# Patient Record
Sex: Female | Born: 1940 | Race: White | Hispanic: No | Marital: Single | Smoking: Former smoker
Health system: Southern US, Community
[De-identification: ages and names within clinical notes are randomized; demographics above are authoritative.]

## PROBLEM LIST (undated history)

## (undated) DIAGNOSIS — I340 Nonrheumatic mitral (valve) insufficiency: Secondary | ICD-10-CM

## (undated) DIAGNOSIS — I10 Essential (primary) hypertension: Secondary | ICD-10-CM

## (undated) DIAGNOSIS — E119 Type 2 diabetes mellitus without complications: Secondary | ICD-10-CM

## (undated) DIAGNOSIS — I4819 Other persistent atrial fibrillation: Secondary | ICD-10-CM

## (undated) DIAGNOSIS — E785 Hyperlipidemia, unspecified: Secondary | ICD-10-CM

## (undated) DIAGNOSIS — Z853 Personal history of malignant neoplasm of breast: Secondary | ICD-10-CM

## (undated) DIAGNOSIS — M810 Age-related osteoporosis without current pathological fracture: Secondary | ICD-10-CM

## (undated) DIAGNOSIS — D0510 Intraductal carcinoma in situ of unspecified breast: Secondary | ICD-10-CM

## (undated) HISTORY — PX: HX BREAST BIOPSY: SHX20

## (undated) HISTORY — PX: HX HEMICOLECTOMY: 2100001145

## (undated) HISTORY — DX: Hyperlipidemia, unspecified: E78.5

## (undated) HISTORY — DX: Age-related osteoporosis without current pathological fracture: M81.0

## (undated) HISTORY — PX: HX COLONOSCOPY: 2100001147

## (undated) HISTORY — DX: Personal history of malignant neoplasm of breast: Z85.3

## (undated) HISTORY — PX: ILEOSTOMY: SHX1783

## (undated) HISTORY — DX: Intraductal carcinoma in situ of unspecified breast: D05.10

## (undated) HISTORY — PX: HX OTHER: 2100001105

## (undated) HISTORY — DX: Essential (primary) hypertension: I10

## (undated) HISTORY — DX: Other persistent atrial fibrillation: I48.19

## (undated) HISTORY — PX: CATARACT EXTRACTION, BILATERAL: SHX1313

## (undated) HISTORY — DX: Nonrheumatic mitral (valve) insufficiency: I34.0

## (undated) HISTORY — PX: PARTIAL HYSTERECTOMY: SHX80

---

## 2008-04-14 ENCOUNTER — Encounter: Admission: RE | Admit: 2008-04-14 | Discharge: 2008-04-14 | Payer: Self-pay | Admitting: Internal Medicine

## 2008-05-22 ENCOUNTER — Ambulatory Visit (HOSPITAL_COMMUNITY): Admission: RE | Admit: 2008-05-22 | Discharge: 2008-05-22 | Payer: Self-pay | Admitting: *Deleted

## 2008-11-17 ENCOUNTER — Encounter: Admission: RE | Admit: 2008-11-17 | Discharge: 2008-11-17 | Payer: Self-pay | Admitting: Internal Medicine

## 2009-03-08 ENCOUNTER — Ambulatory Visit (HOSPITAL_COMMUNITY): Payer: Self-pay | Admitting: INTERNAL MEDICINE

## 2009-03-25 ENCOUNTER — Encounter: Admission: RE | Admit: 2009-03-25 | Discharge: 2009-03-25 | Payer: Self-pay | Admitting: Internal Medicine

## 2010-09-05 ENCOUNTER — Encounter: Payer: Self-pay | Admitting: Internal Medicine

## 2010-09-29 ENCOUNTER — Ambulatory Visit: Payer: Medicare Other | Attending: Internal Medicine | Admitting: Rehabilitative and Restorative Service Providers"

## 2010-09-29 DIAGNOSIS — IMO0001 Reserved for inherently not codable concepts without codable children: Secondary | ICD-10-CM | POA: Insufficient documentation

## 2010-09-29 DIAGNOSIS — R42 Dizziness and giddiness: Secondary | ICD-10-CM | POA: Insufficient documentation

## 2010-10-14 ENCOUNTER — Encounter: Payer: Medicare Other | Admitting: Rehabilitative and Restorative Service Providers"

## 2010-12-27 NOTE — Op Note (Signed)
NAME:  Virginia Gilbert, Virginia Gilbert NO.:  [id number]   MEDICAL RECORD NO.:  [id number]          PATIENT TYPE:  AMB   LOCATION:  ENDO                         FACILITY:  [facility]   PHYSICIAN:  Georgiana Spinner, M.D.    DATE OF BIRTH:  1941-02-19   DATE OF PROCEDURE:  DATE OF DISCHARGE:                               OPERATIVE REPORT   PROCEDURE:  Upper endoscopy.   INDICATIONS:  GERD and abdominal pain.   ANESTHESIA:  Fentanyl 50 mcg, Versed 5 mg.   PROCEDURE:  With the patient mildly sedated in the left lateral  decubitus position the Pentax videoscopic endoscope was inserted in the  mouth and passed under direct vision through the esophagus which  appeared normal into the stomach.  The fundus, body, antrum, duodenal  bulb, second portion of the duodenum were visualized and all appeared  normal.  From this point the endoscope was slowly withdrawn taking  circumferential views of duodenal mucosa until the endoscope had been  pulled back into the stomach, placed in retroflexion to view the stomach  from below.  The endoscope was straightened and withdrawn taking  circumferential views of the remaining gastric and esophageal mucosa.  The patient's vital signs and pulse oximeter remained stable.  The  patient tolerated the procedure well without apparent complications.   FINDINGS:  Negative examination.   PLAN:  I will have the patient call me and set up a return visit for  further follow-up for her abdominal pain.  Proceed to colonoscopy as  planned.           ______________________________  Georgiana Spinner, M.D.     GMO/MEDQ  D:  05/22/2008  T:  05/22/2008  Job:  [id number]

## 2010-12-27 NOTE — Op Note (Signed)
NAME:  Virginia Gilbert, KIRCHMAN NO.:  [id number]   MEDICAL RECORD NO.:  [id number]          PATIENT TYPE:  AMB   LOCATION:  ENDO                         FAC[facility]  [facility]   PHYSICIAN:  Georgiana Spinner, M.D.    DATE OF BIRTH:  07-06-1941   DATE OF PROCEDURE:  DATE OF DISCHARGE:                               OPERATIVE REPORT   PROCEDURE:  Colonoscopy.   INDICATIONS:  Abdominal pain, colon cancer screening.   ANESTHESIA:  Fentanyl 20 mcg, Versed 2 mg.   PROCEDURE:  With the patient mildly sedated in the left lateral  decubitus position, the Pentax videoscopic colonoscope was inserted in  the rectum and passed under direct vision with pressure applied to reach  the cecum, identified by the ileocecal valve and appendiceal orifice,  both which were photographed.  From this point the colonoscope was  slowly withdrawn, taking circumferential views of the colonic mucosa,  stopping only in the rectum, which appeared normal on direct and showed  hemorrhoids on retroflexed view.  The endoscope was straightened and  withdrawn.  The patient's vital signs, pulse oximeter remained stable.  The patient tolerated the procedure well without apparent complications.   FINDINGS:  Internal hemorrhoids, otherwise an unremarkable examination.   PLAN:  Will have patient follow up with me in 5 years for repeat  discussion of colon cancer screening and to make an appointment in about  6 weeks for followup of abdominal pain.           ______________________________  Georgiana Spinner, M.D.     GMO/MEDQ  D:  05/22/2008  T:  05/22/2008  Job:  [id number]

## 2011-05-16 LAB — GLUCOSE, CAPILLARY: Glucose-Capillary: 127 — ABNORMAL HIGH

## 2011-06-09 ENCOUNTER — Encounter (INDEPENDENT_AMBULATORY_CARE_PROVIDER_SITE_OTHER): Payer: Self-pay | Admitting: GENERAL SURGERY

## 2011-06-12 ENCOUNTER — Encounter (INDEPENDENT_AMBULATORY_CARE_PROVIDER_SITE_OTHER): Payer: Self-pay | Admitting: GENERAL SURGERY

## 2011-06-14 ENCOUNTER — Ambulatory Visit (INDEPENDENT_AMBULATORY_CARE_PROVIDER_SITE_OTHER): Payer: Medicare Other | Admitting: GENERAL SURGERY

## 2011-06-14 ENCOUNTER — Encounter (INDEPENDENT_AMBULATORY_CARE_PROVIDER_SITE_OTHER): Payer: Self-pay | Admitting: GENERAL SURGERY

## 2011-06-14 VITALS — BP 139/83 | HR 82 | Temp 97.4°F | Ht 64.0 in | Wt 153.3 lb

## 2011-06-14 DIAGNOSIS — D0590 Unspecified type of carcinoma in situ of unspecified breast: Secondary | ICD-10-CM

## 2011-06-14 DIAGNOSIS — D0510 Intraductal carcinoma in situ of unspecified breast: Secondary | ICD-10-CM | POA: Insufficient documentation

## 2011-06-14 NOTE — Progress Notes (Signed)
[facility], Department of Surgery  [address], [place]  [place], [place] [address]  [phone]    PROGRESS NOTE    PATIENT NAME: Selena Stokes, Selena Stokes  CHART NUMBER: [id number]  DATE OF BIRTH: [birth year]  DATE OF SERVICE: 06/14/2011    SUBJECTIVE:  A 70 year old female presents in followup after a left stereotactic biopsy per Dr. Carey Bullocks on June 06, 2011, demonstrating high-grade ductal carcinoma in situ.  This came about after screening bilateral mammography from May 17, 2011, demonstrated increasing calcifications in the upper outer left breast.  These seem to be over a large area in the upper outer left breast.  Subsequent spot compression views further define these and she was ultimately categorized a Bi-RADS category 5.  I reviewed her films and would concur.  The right breast was unremarkable.    There is no family history for breast or ovarian or colon cancer.    OBJECTIVE:  Healthy in appearance.  No cervical, supraclavicular or axillary lymphadenopathy.  Lung fields clear and equal.  Heart regular.  Right breast, without suspicious skin or nipple changes and no dominant masses.  Left breast demonstrates some mild induration in the upper outer left breast from the mammotome biopsy but no masses or palpable lymphadenopathy.  Abdomen without hepatomegaly.    ASSESSMENT:  High-grade ductal carcinoma in situ (DCIS), left breast.    PLAN:  I had a long discussion with Mrs. Lem and her husband.  I reviewed the role of breast conservation versus mastectomy for local regional control and staging.  I had a very long discussion.  Mrs. Santori states that she has considered her options and wishes to proceed with a mastectomy.  I discussed the role of sentinel lymph node biopsy and informed consent was obtained.      Theodoro Clock, MD, FACS  Associate Professor, [facility] Department of Surgery  [facility], [place] Division     [id number]; D: 06/14/2011 15:17:04; T: 06/14/2011 15:28:17

## 2011-06-14 NOTE — Patient Instructions (Signed)
Per office instructions - Call Teresa with any questions or concerns ([phone])

## 2011-06-15 ENCOUNTER — Encounter (INDEPENDENT_AMBULATORY_CARE_PROVIDER_SITE_OTHER): Payer: Self-pay | Admitting: GENERAL SURGERY

## 2011-06-30 DIAGNOSIS — D0590 Unspecified type of carcinoma in situ of unspecified breast: Secondary | ICD-10-CM

## 2011-06-30 HISTORY — PX: HX MASTECTOMY, SIMPLE: SHX3

## 2011-07-01 DIAGNOSIS — D0590 Unspecified type of carcinoma in situ of unspecified breast: Secondary | ICD-10-CM

## 2011-07-03 ENCOUNTER — Encounter (INDEPENDENT_AMBULATORY_CARE_PROVIDER_SITE_OTHER): Payer: Self-pay | Admitting: GENERAL SURGERY

## 2011-07-11 ENCOUNTER — Encounter (INDEPENDENT_AMBULATORY_CARE_PROVIDER_SITE_OTHER): Payer: Self-pay | Admitting: GENERAL SURGERY

## 2011-07-11 ENCOUNTER — Ambulatory Visit (INDEPENDENT_AMBULATORY_CARE_PROVIDER_SITE_OTHER): Payer: Medicare Other | Admitting: GENERAL SURGERY

## 2011-07-11 VITALS — BP 153/94 | HR 86 | Temp 97.5°F | Ht 64.0 in | Wt 149.2 lb

## 2011-07-11 DIAGNOSIS — Z09 Encounter for follow-up examination after completed treatment for conditions other than malignant neoplasm: Secondary | ICD-10-CM

## 2011-07-11 DIAGNOSIS — D0510 Intraductal carcinoma in situ of unspecified breast: Secondary | ICD-10-CM | POA: Insufficient documentation

## 2011-07-11 NOTE — Progress Notes (Signed)
[facility], Department of Surgery  [address], [place]  [place], [place] [address]  [phone]    PROGRESS NOTE    PATIENT NAME: Selena Stokes, Selena Stokes  CHART NUMBER: [id number]  DATE OF BIRTH: 01-03-1941  DATE OF SERVICE: 07/11/2011    SUBJECTIVE:  Follow up after a left mastectomy with sentinel lymph node biopsy on June 30, 2011.  Postoperative course was uneventful.  Pathology demonstrated high-grade DCIS with microinvasion.  Sentinel lymph node negative.    OBJECTIVE:  Her subcuticular closure is healing quite nicely.  The drain was removed.  Area redressed in sterile fashion.    ASSESSMENT:  High-grade, ER/PR negative DCIS with microinvasion left breast.    PLAN:  Follow up in 2 weeks for repeat assessment.  Wound and activity instructions given.      Theodoro Clock, MD, FACS  Associate Professor, [facility] Department of Surgery  [facility], [place] Division    [id number]; D: 07/11/2011 12:55:12; T: 07/11/2011 14:39:31

## 2011-07-11 NOTE — Patient Instructions (Signed)
Follow up in 2 wks. Call or return with any concerns or problems. [phone].

## 2011-07-26 ENCOUNTER — Encounter (INDEPENDENT_AMBULATORY_CARE_PROVIDER_SITE_OTHER): Payer: Self-pay | Admitting: GENERAL SURGERY

## 2011-07-31 ENCOUNTER — Encounter (INDEPENDENT_AMBULATORY_CARE_PROVIDER_SITE_OTHER): Payer: Self-pay | Admitting: GENERAL SURGERY

## 2011-08-02 ENCOUNTER — Encounter (INDEPENDENT_AMBULATORY_CARE_PROVIDER_SITE_OTHER): Payer: Self-pay | Admitting: GENERAL SURGERY

## 2011-08-02 ENCOUNTER — Ambulatory Visit (INDEPENDENT_AMBULATORY_CARE_PROVIDER_SITE_OTHER): Payer: Medicare Other | Admitting: GENERAL SURGERY

## 2011-08-02 VITALS — BP 132/85 | HR 78 | Temp 97.2°F | Ht 64.0 in | Wt 143.6 lb

## 2011-08-02 DIAGNOSIS — D0510 Intraductal carcinoma in situ of unspecified breast: Secondary | ICD-10-CM

## 2011-08-02 DIAGNOSIS — Z09 Encounter for follow-up examination after completed treatment for conditions other than malignant neoplasm: Secondary | ICD-10-CM

## 2011-08-02 NOTE — Progress Notes (Signed)
[facility], Department of Surgery  [address], [place]  [place], [place] [address]  [phone]    PROGRESS NOTE    PATIENT NAME: Selena Stokes, Selena Stokes  CHART NUMBER: [id number]  DATE OF BIRTH: 04/26/1941  DATE OF SERVICE: 08/02/2011    SUBJECTIVE:  A 70 year old female presents in followup after a left MAST/SNB for a high-grade receptor negative DCIS with microinvasion on June 30, 2011.    OBJECTIVE:  Exam reveals some degree of seroma, 100 mL was evacuated in the standard fashion, dark serosanguineous.  Her subcuticular closure is healing quite nicely.    ASSESSMENT:  ER/PR negative, high-grade DCIS with microinvasion left breast, June 30, 2011.    PLAN:  Follow up in February for a 31-month surveillance exam.      Theodoro Clock, MD, FACS  Associate Professor, [facility] Department of Surgery  [facility], [place] Division    [id number]; D: 08/02/2011 14:14:47; T: 08/02/2011 17:40:07

## 2011-08-02 NOTE — Patient Instructions (Signed)
Follow up in 2 months. Call or return with any concerns or problems. [phone].

## 2011-10-02 ENCOUNTER — Ambulatory Visit (INDEPENDENT_AMBULATORY_CARE_PROVIDER_SITE_OTHER): Payer: Medicare Other | Admitting: GENERAL SURGERY

## 2011-10-02 ENCOUNTER — Encounter (INDEPENDENT_AMBULATORY_CARE_PROVIDER_SITE_OTHER): Payer: Self-pay | Admitting: GENERAL SURGERY

## 2011-10-02 VITALS — BP 113/74 | HR 67 | Temp 97.4°F | Ht 64.0 in | Wt 146.1 lb

## 2011-10-02 DIAGNOSIS — D0510 Intraductal carcinoma in situ of unspecified breast: Secondary | ICD-10-CM

## 2011-10-02 DIAGNOSIS — Z09 Encounter for follow-up examination after completed treatment for conditions other than malignant neoplasm: Secondary | ICD-10-CM

## 2011-10-02 NOTE — Patient Instructions (Signed)
Follow up in 3 months. Call or return with any concerns or problems. [phone].

## 2011-10-02 NOTE — Progress Notes (Signed)
[facility], Department of Surgery  [address] [place]  [place], [place] [address]  [phone]    PROGRESS NOTE    PATIENT NAME: Selena Stokes, Selena Stokes  CHART NUMBER: [id number]  DATE OF BIRTH: [birth year]  DATE OF SERVICE: 10/02/2011    SUBJECTIVE:  This 71 year old female presents for a 35-month surveillance exam for left breast stage IA, T1a N0 M0, ER/PR negative, high-grade DCIS with microinvasion left breast (June 30, 2011).  Ms. Sergent is status post a left MAST/SNB for this process.  Her postoperative course has been uneventful.    OBJECTIVE:  No cervical, supraclavicular or axillary lymphadenopathy.  Lung fields clear and equal.  Heart regular.  Right breast without suspicious skin or nipple changes.  No dominant masses.  The left chest wall is well healed with minimal seroma.  No nodularity.  Abdomen is soft without hepatomegaly.    ASSESSMENT:  Stage IA, T1a N0 M0, ER/PR negative, high-grade DCIS with microinvasion left breast.    PLAN:  Follow up in 3 months for her 65-month surveillance exam.      Theodoro Clock, MD, FACS  Associate Professor, [facility] Department of Surgery  [facility], [place] Division    [id number]; D: 10/02/2011 13:20:58; T: 10/02/2011 15:12:26

## 2012-01-01 ENCOUNTER — Encounter (INDEPENDENT_AMBULATORY_CARE_PROVIDER_SITE_OTHER): Payer: Self-pay | Admitting: GENERAL SURGERY

## 2012-01-01 ENCOUNTER — Ambulatory Visit (INDEPENDENT_AMBULATORY_CARE_PROVIDER_SITE_OTHER): Payer: Medicare Other | Admitting: GENERAL SURGERY

## 2012-01-01 VITALS — BP 131/86 | HR 68 | Temp 97.8°F | Ht 64.0 in | Wt 148.4 lb

## 2012-01-01 DIAGNOSIS — Z09 Encounter for follow-up examination after completed treatment for conditions other than malignant neoplasm: Secondary | ICD-10-CM

## 2012-01-01 DIAGNOSIS — D0510 Intraductal carcinoma in situ of unspecified breast: Secondary | ICD-10-CM

## 2012-01-01 DIAGNOSIS — Z853 Personal history of malignant neoplasm of breast: Secondary | ICD-10-CM

## 2012-01-01 NOTE — Progress Notes (Signed)
[facility], Department of Surgery  [address] [place]  [place], [place] [address]  [phone]    PROGRESS NOTE    PATIENT NAME: Selena Stokes, Selena Stokes  CHART NUMBER: [id number]  DATE OF BIRTH: 25-Nov-1940  DATE OF SERVICE: 01/01/2012    SUBJECTIVE:  A 71 year old female presents for her 86-month surveillance exam for early stage left breast cancer.    She is S/P a left mastectomy with sentinel lymph node biopsy on June 30, 2011.  She demonstrated a stage IA, T1a N0 M0, ER/PR negative, high-grade DCIS with microinvasion.    Because of her receptor negativity, she was not referred for any further treatment.    Mrs. Vanderpool recently had some concerns regarding possible subareolar right breast mass.  She underwent a right diagnostic mammogram and focused breast ultrasound, both of which were BI-RADS category 1 with no evidence of abnormalities.    OBJECTIVE:  Healthy in appearance.  No cervical, supraclavicular or axillary lymphadenopathy.  Lung fields clear and equal.  Heart:  Regular.  Right breast without suspicious skin or nipple changes.  No evidence of dominant masses.  I had her point out the area of concern in the 12 o'clock position of the subareolar area.  I could feel no evidence of lesions.  The left chest wall was well healed without nodularity or suspicious processes.  Abdomen:  Soft without hepatomegaly.    ASSESSMENT:  Stage IA, ER/PR negative, high-grade DCIS with microinvasion left breast, 6 months out.    PLAN:  Follow up in November for her 1-year surveillance exam.      Theodoro Clock, MD, FACS  Associate Professor, [facility] Department of Surgery  [facility], [place] Division    [id number]; D: 01/01/2012 13:46:54; T: 01/01/2012 14:32:07

## 2012-01-01 NOTE — Patient Instructions (Signed)
Follow up in 6 months. Call or return with any concerns or problems. [phone].

## 2012-03-14 ENCOUNTER — Other Ambulatory Visit: Payer: Self-pay | Admitting: Internal Medicine

## 2012-03-14 DIAGNOSIS — R42 Dizziness and giddiness: Secondary | ICD-10-CM

## 2012-03-15 ENCOUNTER — Inpatient Hospital Stay: Admission: RE | Admit: 2012-03-15 | Payer: Medicare Other | Source: Ambulatory Visit

## 2012-03-18 ENCOUNTER — Other Ambulatory Visit: Payer: Self-pay | Admitting: Internal Medicine

## 2012-03-18 DIAGNOSIS — R42 Dizziness and giddiness: Secondary | ICD-10-CM

## 2012-03-22 ENCOUNTER — Ambulatory Visit
Admission: RE | Admit: 2012-03-22 | Discharge: 2012-03-22 | Disposition: A | Discharge status: Home / Self Care | Payer: Medicare Other | Source: Ambulatory Visit | Attending: Internal Medicine | Admitting: Internal Medicine

## 2012-03-22 DIAGNOSIS — R42 Dizziness and giddiness: Secondary | ICD-10-CM

## 2012-03-29 ENCOUNTER — Other Ambulatory Visit: Payer: Self-pay | Admitting: Internal Medicine

## 2012-03-29 DIAGNOSIS — E041 Nontoxic single thyroid nodule: Secondary | ICD-10-CM

## 2012-04-03 ENCOUNTER — Ambulatory Visit
Admission: RE | Admit: 2012-04-03 | Discharge: 2012-04-03 | Disposition: A | Discharge status: Home / Self Care | Payer: Medicare Other | Source: Ambulatory Visit | Attending: Internal Medicine | Admitting: Internal Medicine

## 2012-04-03 DIAGNOSIS — E041 Nontoxic single thyroid nodule: Secondary | ICD-10-CM

## 2012-04-09 ENCOUNTER — Other Ambulatory Visit: Payer: Self-pay | Admitting: Neurology

## 2012-04-09 DIAGNOSIS — R42 Dizziness and giddiness: Secondary | ICD-10-CM

## 2012-04-09 DIAGNOSIS — I10 Essential (primary) hypertension: Secondary | ICD-10-CM

## 2012-04-09 DIAGNOSIS — H9319 Tinnitus, unspecified ear: Secondary | ICD-10-CM

## 2012-04-09 DIAGNOSIS — E119 Type 2 diabetes mellitus without complications: Secondary | ICD-10-CM

## 2012-04-10 ENCOUNTER — Other Ambulatory Visit: Payer: Self-pay | Admitting: Internal Medicine

## 2012-04-10 DIAGNOSIS — E041 Nontoxic single thyroid nodule: Secondary | ICD-10-CM

## 2012-04-11 ENCOUNTER — Other Ambulatory Visit (HOSPITAL_COMMUNITY)
Admission: RE | Admit: 2012-04-11 | Discharge: 2012-04-11 | Disposition: A | Discharge status: Home / Self Care | Payer: Medicare Other | Source: Ambulatory Visit | Attending: Interventional Radiology | Admitting: Interventional Radiology

## 2012-04-11 ENCOUNTER — Ambulatory Visit
Admission: RE | Admit: 2012-04-11 | Discharge: 2012-04-11 | Disposition: A | Discharge status: Home / Self Care | Payer: Medicare Other | Source: Ambulatory Visit | Attending: Internal Medicine | Admitting: Internal Medicine

## 2012-04-11 DIAGNOSIS — E049 Nontoxic goiter, unspecified: Secondary | ICD-10-CM | POA: Insufficient documentation

## 2012-04-11 DIAGNOSIS — E041 Nontoxic single thyroid nodule: Secondary | ICD-10-CM

## 2012-04-16 ENCOUNTER — Ambulatory Visit
Admission: RE | Admit: 2012-04-16 | Discharge: 2012-04-16 | Disposition: A | Discharge status: Home / Self Care | Payer: Medicare Other | Source: Ambulatory Visit | Attending: Neurology | Admitting: Neurology

## 2012-04-16 DIAGNOSIS — R42 Dizziness and giddiness: Secondary | ICD-10-CM

## 2012-04-16 DIAGNOSIS — H9319 Tinnitus, unspecified ear: Secondary | ICD-10-CM

## 2012-04-16 DIAGNOSIS — E119 Type 2 diabetes mellitus without complications: Secondary | ICD-10-CM

## 2012-04-16 DIAGNOSIS — I10 Essential (primary) hypertension: Secondary | ICD-10-CM

## 2012-04-16 MED ORDER — GADOBENATE DIMEGLUMINE 529 MG/ML IV SOLN
15.0000 mL | Freq: Once | INTRAVENOUS | Status: AC | PRN
  Administered 2012-04-16: 15 mL via INTRAVENOUS

## 2012-04-30 DIAGNOSIS — N63 Unspecified lump in unspecified breast: Secondary | ICD-10-CM

## 2012-04-30 DIAGNOSIS — Z853 Personal history of malignant neoplasm of breast: Secondary | ICD-10-CM

## 2012-06-10 ENCOUNTER — Encounter (INDEPENDENT_AMBULATORY_CARE_PROVIDER_SITE_OTHER): Payer: Self-pay | Admitting: GENERAL SURGERY

## 2012-07-01 ENCOUNTER — Ambulatory Visit (INDEPENDENT_AMBULATORY_CARE_PROVIDER_SITE_OTHER): Payer: Medicare Other | Admitting: GENERAL SURGERY

## 2012-07-01 ENCOUNTER — Encounter (INDEPENDENT_AMBULATORY_CARE_PROVIDER_SITE_OTHER): Payer: Self-pay | Admitting: GENERAL SURGERY

## 2012-07-01 VITALS — BP 128/75 | HR 70 | Temp 97.1°F | Ht 64.0 in | Wt 141.8 lb

## 2012-07-01 DIAGNOSIS — Z853 Personal history of malignant neoplasm of breast: Secondary | ICD-10-CM

## 2012-07-01 DIAGNOSIS — Z09 Encounter for follow-up examination after completed treatment for conditions other than malignant neoplasm: Secondary | ICD-10-CM

## 2012-07-01 DIAGNOSIS — D0510 Intraductal carcinoma in situ of unspecified breast: Secondary | ICD-10-CM

## 2012-07-01 NOTE — Progress Notes (Signed)
[facility], Department of Surgery  [address], [place]  [place], [place] [address]  [phone]    PROGRESS NOTE    PATIENT NAME: Selena Stokes, Selena Stokes  CHART NUMBER: [id number]  DATE OF BIRTH: [birth year]  DATE OF SERVICE: 07/01/2012    SUBJECTIVE:  A 71 year old female with stage IA left breast cancer presents for her 1-year surveillance exam (S/P left MAST/SNB on June 30, 2011).  She demonstrated stage IA, T1a (high-grade DCIS with microinvasion) N0 (0/3) M0, ER/PR negative disease.  No further therapy recommended postmastectomy.    Right-sided mammogram from Dec 21, 2011, was BI-RADS 1.    OBJECTIVE:  Healthy in appearance.  No cervical, supraclavicular or axillary lymphadenopathy.  Left chest wall well healed without suspicious nodules, thickening or abnormalities.  Right breast without suspicious skin or nipple changes.  No evidence of dominant masses throughout.  Abdomen is soft without hepatomegaly.    ASSESSMENT:  Stage I, receptor negative, high-grade DCIS with microinvasion left breast, 1 year out.    PLAN:  Follow up in 6 months.  Proceed with right mammogram in 6 months.      Theodoro Clock, MD, FACS  Associate Professor, [facility] Department of Surgery  [place], [place] Division    [id number]; D: 07/01/2012 13:44:59; T: 07/01/2012 29:52:84

## 2012-07-01 NOTE — Patient Instructions (Signed)
Follow up in 6 months. Call or return with any concerns or problems. [phone].

## 2012-11-04 ENCOUNTER — Other Ambulatory Visit: Payer: Self-pay | Admitting: Internal Medicine

## 2012-11-04 DIAGNOSIS — R109 Unspecified abdominal pain: Secondary | ICD-10-CM

## 2012-11-07 ENCOUNTER — Ambulatory Visit
Admission: RE | Admit: 2012-11-07 | Discharge: 2012-11-07 | Disposition: A | Discharge status: Home / Self Care | Payer: Medicare Other | Source: Ambulatory Visit | Attending: Internal Medicine | Admitting: Internal Medicine

## 2012-11-07 DIAGNOSIS — R109 Unspecified abdominal pain: Secondary | ICD-10-CM

## 2012-11-07 MED ORDER — IOHEXOL 300 MG/ML  SOLN
100.0000 mL | Freq: Once | INTRAMUSCULAR | Status: AC | PRN
  Administered 2012-11-07: 100 mL via INTRAVENOUS

## 2012-12-30 ENCOUNTER — Encounter (INDEPENDENT_AMBULATORY_CARE_PROVIDER_SITE_OTHER): Payer: Self-pay | Admitting: GENERAL SURGERY

## 2013-01-08 ENCOUNTER — Ambulatory Visit (INDEPENDENT_AMBULATORY_CARE_PROVIDER_SITE_OTHER): Payer: Medicare Other | Admitting: GENERAL SURGERY

## 2013-01-08 ENCOUNTER — Encounter (INDEPENDENT_AMBULATORY_CARE_PROVIDER_SITE_OTHER): Payer: Self-pay | Admitting: GENERAL SURGERY

## 2013-01-08 VITALS — BP 113/79 | HR 67 | Temp 98.6°F | Ht 64.0 in | Wt 131.2 lb

## 2013-01-08 DIAGNOSIS — Z09 Encounter for follow-up examination after completed treatment for conditions other than malignant neoplasm: Secondary | ICD-10-CM

## 2013-01-08 DIAGNOSIS — Z853 Personal history of malignant neoplasm of breast: Secondary | ICD-10-CM

## 2013-01-08 DIAGNOSIS — D0510 Intraductal carcinoma in situ of unspecified breast: Secondary | ICD-10-CM

## 2013-01-08 NOTE — Patient Instructions (Signed)
Follow up in 6 months. Call or return with any concerns or problems. [phone].

## 2013-01-08 NOTE — Progress Notes (Signed)
[facility], Department of Surgery  [address], [place]  [place], [place] [address]  [phone]    PROGRESS NOTE    PATIENT NAME: Selena Stokes, Selena Stokes  CHART NUMBER: [id number]  DATE OF BIRTH: June 15, 1941  DATE OF SERVICE: 01/08/2013    SUBJECTIVE:  This 72 year old female presents for her 28-month left breast cancer surveillance exam.      S/P left MAST/SNB on June 30, 2011.  She demonstrated stage IA, T1a (high-grade DCIS with microinvasion) N0 (0/3) M0, ER/PR negative disease.  No further therapy was recommended postmastectomy.    She underwent a right-sided mammogram 1 week ago.  It is reportedly normal.  We will send for a copy of that result.    OBJECTIVE:  Healthy in appearance.  No cervical, supraclavicular or axillary lymphadenopathy.  Her right breast is without suspicious skin or nipple changes.  No evidence of dominant masses throughout the right.  The left chest wall is well healed with no suspicious thickening or abnormalities.  Abdomen is soft without hepatomegaly.    ASSESSMENT:  Stage IA, receptor negative, high-grade DCIS with microinvasion left breast, 18 months out.    PLAN:  Follow up in November for her 2-year surveillance exam.      Theodoro Clock, MD, FACS  Associate Professor, [facility] Department of Surgery  [facility], [place] Division    [id number]; D: 01/08/2013 14:24:31; T: 01/08/2013 15:24:46

## 2013-06-30 ENCOUNTER — Encounter (INDEPENDENT_AMBULATORY_CARE_PROVIDER_SITE_OTHER): Payer: Self-pay | Admitting: GENERAL SURGERY

## 2013-06-30 ENCOUNTER — Ambulatory Visit (INDEPENDENT_AMBULATORY_CARE_PROVIDER_SITE_OTHER): Payer: Medicare Other | Admitting: GENERAL SURGERY

## 2013-06-30 VITALS — BP 139/83 | HR 69 | Temp 97.6°F | Ht 64.0 in | Wt 126.8 lb

## 2013-06-30 DIAGNOSIS — Z09 Encounter for follow-up examination after completed treatment for conditions other than malignant neoplasm: Secondary | ICD-10-CM

## 2013-06-30 DIAGNOSIS — D0510 Intraductal carcinoma in situ of unspecified breast: Secondary | ICD-10-CM

## 2013-06-30 DIAGNOSIS — Z853 Personal history of malignant neoplasm of breast: Secondary | ICD-10-CM

## 2013-06-30 NOTE — Patient Instructions (Signed)
Call or return with any concerns or problems. [phone].

## 2013-06-30 NOTE — Progress Notes (Signed)
[facility], Department of Surgery  [address], [place]  [place], [place] [address]  [phone]    PROGRESS NOTE    PATIENT NAME: Selena Stokes, Selena Stokes  CHART NUMBER: [id number]  DATE OF BIRTH: October 17, 1940  DATE OF SERVICE: 06/30/2013    SUBJECTIVE:  Two-year surveillance exam for this 72 year old female who had a DCIS with microinvasion of the left breast.    S/P left MAST/SNB on June 30, 2011, for a stage IA, T1a (high-grade DCIS with microinvasion) N0 (0/3) M0, ER/PR negative disease.  No further therapy was recommended.    Doing well, no specific complaints.  Due for right mammogram in the near future.    OBJECTIVE:  Healthy in appearance.  No cervical, supraclavicular or axillary lymphadenopathy.  Lung fields clear and equal.  Heart regular.  The right breast is without suspicious skin or nipple changes and harbors no dominant masses.  The left chest wall was well healed with no nodularity or thickening.  No left upper extremity swelling.  Abdomen soft without hepatomegaly.    ASSESSMENT:  Stage IA, receptor negative, high-grade DCIS with microinvasion left breast, 2 years out.    PLAN:  Mrs. Drawdy will follow up as needed at this point.  She understands to contact me in the future with any specific concerns or problems.      Theodoro Clock, MD, FACS  Associate Professor, [facility] Department of Surgery  [facility], [place] Division    [id number]; D: 06/30/2013 13:41:13; T: 06/30/2013 15:26:21

## 2014-09-17 ENCOUNTER — Ambulatory Visit: Payer: Self-pay | Admitting: Podiatrist

## 2014-10-22 DIAGNOSIS — E119 Type 2 diabetes mellitus without complications: Secondary | ICD-10-CM | POA: Diagnosis not present

## 2014-10-22 DIAGNOSIS — E780 Pure hypercholesterolemia: Secondary | ICD-10-CM | POA: Diagnosis not present

## 2014-10-22 DIAGNOSIS — E118 Type 2 diabetes mellitus with unspecified complications: Secondary | ICD-10-CM | POA: Diagnosis not present

## 2014-10-22 DIAGNOSIS — I10 Essential (primary) hypertension: Secondary | ICD-10-CM | POA: Diagnosis not present

## 2014-11-09 DIAGNOSIS — E119 Type 2 diabetes mellitus without complications: Secondary | ICD-10-CM | POA: Diagnosis not present

## 2014-11-25 DIAGNOSIS — I10 Essential (primary) hypertension: Secondary | ICD-10-CM | POA: Diagnosis not present

## 2014-11-25 DIAGNOSIS — E119 Type 2 diabetes mellitus without complications: Secondary | ICD-10-CM | POA: Diagnosis not present

## 2014-11-25 DIAGNOSIS — E780 Pure hypercholesterolemia: Secondary | ICD-10-CM | POA: Diagnosis not present

## 2015-01-07 DIAGNOSIS — R079 Chest pain, unspecified: Secondary | ICD-10-CM | POA: Diagnosis not present

## 2015-01-07 DIAGNOSIS — E118 Type 2 diabetes mellitus with unspecified complications: Secondary | ICD-10-CM | POA: Diagnosis not present

## 2015-01-07 DIAGNOSIS — I10 Essential (primary) hypertension: Secondary | ICD-10-CM | POA: Diagnosis not present

## 2015-01-07 DIAGNOSIS — E780 Pure hypercholesterolemia: Secondary | ICD-10-CM | POA: Diagnosis not present

## 2015-01-08 ENCOUNTER — Telehealth: Payer: Self-pay | Admitting: Cardiology

## 2015-01-08 NOTE — Telephone Encounter (Signed)
01/08/2015 [facility] Dr. Antoine PocheHochrein on 01/27/2015.  [facility]

## 2015-01-20 DIAGNOSIS — R5381 Other malaise: Secondary | ICD-10-CM | POA: Diagnosis not present

## 2015-01-20 DIAGNOSIS — R0789 Other chest pain: Secondary | ICD-10-CM | POA: Diagnosis not present

## 2015-01-20 DIAGNOSIS — R5383 Other fatigue: Secondary | ICD-10-CM | POA: Diagnosis not present

## 2015-01-20 DIAGNOSIS — E1165 Type 2 diabetes mellitus with hyperglycemia: Secondary | ICD-10-CM | POA: Diagnosis not present

## 2015-01-27 ENCOUNTER — Ambulatory Visit: Payer: Self-pay | Admitting: Cardiology

## 2015-02-18 DIAGNOSIS — L219 Seborrheic dermatitis, unspecified: Secondary | ICD-10-CM | POA: Diagnosis not present

## 2015-03-08 DIAGNOSIS — E119 Type 2 diabetes mellitus without complications: Secondary | ICD-10-CM | POA: Diagnosis not present

## 2015-03-08 DIAGNOSIS — I10 Essential (primary) hypertension: Secondary | ICD-10-CM | POA: Diagnosis not present

## 2015-03-12 ENCOUNTER — Ambulatory Visit (INDEPENDENT_AMBULATORY_CARE_PROVIDER_SITE_OTHER): Payer: Medicare Other | Admitting: Podiatry

## 2015-03-12 ENCOUNTER — Encounter: Payer: Self-pay | Admitting: Podiatry

## 2015-03-12 VITALS — BP 129/59 | HR 75 | Resp 17

## 2015-03-12 DIAGNOSIS — M79609 Pain in unspecified limb: Secondary | ICD-10-CM

## 2015-03-12 DIAGNOSIS — M79673 Pain in unspecified foot: Secondary | ICD-10-CM

## 2015-03-12 DIAGNOSIS — E119 Type 2 diabetes mellitus without complications: Secondary | ICD-10-CM | POA: Diagnosis not present

## 2015-03-12 DIAGNOSIS — B351 Tinea unguium: Secondary | ICD-10-CM

## 2015-03-12 DIAGNOSIS — E780 Pure hypercholesterolemia: Secondary | ICD-10-CM | POA: Diagnosis not present

## 2015-03-12 DIAGNOSIS — L219 Seborrheic dermatitis, unspecified: Secondary | ICD-10-CM | POA: Diagnosis not present

## 2015-03-12 DIAGNOSIS — I10 Essential (primary) hypertension: Secondary | ICD-10-CM | POA: Diagnosis not present

## 2015-03-12 NOTE — Progress Notes (Signed)
   Subjective:    Patient ID: Virginia Gilbert, female    DOB: 12/09/1940, 74 y.o.   MRN: [id number]  HPIThis patient presents to the office for nail pain both big toes both feet.  Her nails are thick and long and painful walking and wearing her shoes.  She is diabetic with no foot complications.    Review of Systems  Skin:       Nail changes  All other systems reviewed and are negative.      Objective:   Physical Exam GENERAL APPEARANCE: Alert, conversant. Appropriately groomed. No acute distress.  VASCULAR: Pedal pulses palpable at 2/4 DP and PT bilateral.  Capillary refill time is immediate to all digits,  Proximal to distal cooling it warm to warm.  Digital hair growth is present bilateral  NEUROLOGIC: sensation is intact epicritically and protectively to 5.07 monofilament at 5/5 sites bilateral.  Light touch is intact bilateral, vibratory sensation intact bilateral, achilles tendon reflex is intact bilateral.  MUSCULOSKELETAL: acceptable muscle strength, tone and stability bilateral.  Intrinsic muscluature intact bilateral.  Rectus appearance of foot and digits noted bilateral.   DERMATOLOGIC: skin color, texture, and turgor are within normal limits.  No preulcerative lesions or ulcers  are seen, no interdigital maceration noted.  No open lesions present.  . No drainage noted.NAILS  Thick disfgured discolored nails with ingrowing nails both big toes.  No signs of infection or drainage.         Assessment & Plan:  Onychomycosis  Diabetes  Debridement and gringing of long painful nails.

## 2015-03-19 DIAGNOSIS — R0789 Other chest pain: Secondary | ICD-10-CM | POA: Diagnosis not present

## 2015-03-19 DIAGNOSIS — I10 Essential (primary) hypertension: Secondary | ICD-10-CM | POA: Diagnosis not present

## 2015-03-19 DIAGNOSIS — E119 Type 2 diabetes mellitus without complications: Secondary | ICD-10-CM | POA: Diagnosis not present

## 2015-04-02 DIAGNOSIS — R5381 Other malaise: Secondary | ICD-10-CM | POA: Diagnosis not present

## 2015-04-02 DIAGNOSIS — E782 Mixed hyperlipidemia: Secondary | ICD-10-CM | POA: Diagnosis not present

## 2015-04-02 DIAGNOSIS — R5383 Other fatigue: Secondary | ICD-10-CM | POA: Diagnosis not present

## 2015-04-02 DIAGNOSIS — R0789 Other chest pain: Secondary | ICD-10-CM | POA: Diagnosis not present

## 2015-04-21 DIAGNOSIS — E780 Pure hypercholesterolemia: Secondary | ICD-10-CM | POA: Diagnosis not present

## 2015-04-21 DIAGNOSIS — I10 Essential (primary) hypertension: Secondary | ICD-10-CM | POA: Diagnosis not present

## 2015-04-21 DIAGNOSIS — E119 Type 2 diabetes mellitus without complications: Secondary | ICD-10-CM | POA: Diagnosis not present

## 2015-05-10 DIAGNOSIS — H26491 Other secondary cataract, right eye: Secondary | ICD-10-CM | POA: Diagnosis not present

## 2015-05-10 DIAGNOSIS — E11321 Type 2 diabetes mellitus with mild nonproliferative diabetic retinopathy with macular edema: Secondary | ICD-10-CM | POA: Diagnosis not present

## 2015-05-10 DIAGNOSIS — H524 Presbyopia: Secondary | ICD-10-CM | POA: Diagnosis not present

## 2015-06-07 DIAGNOSIS — I10 Essential (primary) hypertension: Secondary | ICD-10-CM | POA: Diagnosis not present

## 2015-06-07 DIAGNOSIS — E119 Type 2 diabetes mellitus without complications: Secondary | ICD-10-CM | POA: Diagnosis not present

## 2015-06-11 ENCOUNTER — Ambulatory Visit: Payer: Self-pay | Admitting: Podiatry

## 2015-06-11 ENCOUNTER — Ambulatory Visit: Payer: Medicare Other | Admitting: Podiatry

## 2015-06-11 DIAGNOSIS — E119 Type 2 diabetes mellitus without complications: Secondary | ICD-10-CM | POA: Diagnosis not present

## 2015-06-11 DIAGNOSIS — I10 Essential (primary) hypertension: Secondary | ICD-10-CM | POA: Diagnosis not present

## 2015-06-11 DIAGNOSIS — E7800 Pure hypercholesterolemia, unspecified: Secondary | ICD-10-CM | POA: Diagnosis not present

## 2015-06-11 DIAGNOSIS — Z23 Encounter for immunization: Secondary | ICD-10-CM | POA: Diagnosis not present

## 2015-06-23 DIAGNOSIS — H18411 Arcus senilis, right eye: Secondary | ICD-10-CM | POA: Diagnosis not present

## 2015-06-23 DIAGNOSIS — I10 Essential (primary) hypertension: Secondary | ICD-10-CM | POA: Diagnosis not present

## 2015-06-23 DIAGNOSIS — Z961 Presence of intraocular lens: Secondary | ICD-10-CM | POA: Diagnosis not present

## 2015-06-23 DIAGNOSIS — H26491 Other secondary cataract, right eye: Secondary | ICD-10-CM | POA: Diagnosis not present

## 2015-07-02 DIAGNOSIS — H26491 Other secondary cataract, right eye: Secondary | ICD-10-CM | POA: Diagnosis not present

## 2015-07-12 DIAGNOSIS — H52219 Irregular astigmatism, unspecified eye: Secondary | ICD-10-CM | POA: Diagnosis not present

## 2015-07-12 DIAGNOSIS — Z961 Presence of intraocular lens: Secondary | ICD-10-CM | POA: Diagnosis not present

## 2015-07-12 DIAGNOSIS — E119 Type 2 diabetes mellitus without complications: Secondary | ICD-10-CM | POA: Diagnosis not present

## 2015-07-12 DIAGNOSIS — H26492 Other secondary cataract, left eye: Secondary | ICD-10-CM | POA: Diagnosis not present

## 2015-07-12 DIAGNOSIS — I10 Essential (primary) hypertension: Secondary | ICD-10-CM | POA: Diagnosis not present

## 2015-07-26 DIAGNOSIS — H26492 Other secondary cataract, left eye: Secondary | ICD-10-CM | POA: Diagnosis not present

## 2015-07-26 DIAGNOSIS — H524 Presbyopia: Secondary | ICD-10-CM | POA: Diagnosis not present

## 2015-09-27 ENCOUNTER — Encounter: Payer: Self-pay | Admitting: Sports Medicine

## 2015-09-27 ENCOUNTER — Ambulatory Visit (INDEPENDENT_AMBULATORY_CARE_PROVIDER_SITE_OTHER): Payer: Medicare Other | Admitting: Sports Medicine

## 2015-09-27 DIAGNOSIS — M79673 Pain in unspecified foot: Secondary | ICD-10-CM | POA: Diagnosis not present

## 2015-09-27 DIAGNOSIS — E119 Type 2 diabetes mellitus without complications: Secondary | ICD-10-CM | POA: Diagnosis not present

## 2015-09-27 DIAGNOSIS — M79609 Pain in unspecified limb: Principal | ICD-10-CM

## 2015-09-27 DIAGNOSIS — B351 Tinea unguium: Secondary | ICD-10-CM

## 2015-09-27 NOTE — Progress Notes (Signed)
Patient ID: Virginia Gilbert, female   DOB: 1941/01/14, 75 y.o.   MRN: [id number] Subjective: Virginia Gilbert is a 75 y.o. female patient with history of type 2 diabetes who presents to office today complaining of long, painful nails  while ambulating in shoes; unable to trim. Patient states that the glucose reading this morning was /dl.  Patient denies any new changes in medication or new problems. Patient denies any new cramping, numbness, burning or tingling in the legs.  There are no active problems to display for this patient.  Current Outpatient Prescriptions on File Prior to Visit  Medication Sig Dispense Refill  . amLODipine (NORVASC) 2.5 MG tablet Take 2.5 mg by mouth daily.  12  . atorvastatin (LIPITOR) 10 MG tablet Take 10 mg by mouth daily.  1  . FLUOCINOLONE ACETONIDE SCALP 0.01 % OIL USE AS DIRECTED AND LEAVE ON 5 MINUTES THEN RINSE FOR 14 DAYS  0  . hydrochlorothiazide (MICROZIDE) 12.5 MG capsule Take 12.5 mg by mouth daily.  3  . LORazepam (ATIVAN) 0.5 MG tablet TAKE 1 TABLET BY MOUTH 4 TIMES A DAY AS NEEDED  0  . losartan (COZAAR) 100 MG tablet TAKE ONE TABLET BY MOUTH EVERY DAY 30  6  . metFORMIN (GLUCOPHAGE) 1000 MG tablet TAKE ONE TABLET BY MOUTH TWICE A DAY ORALLY  6  . metoprolol succinate (TOPROL-XL) 25 MG 24 hr tablet Take 25 mg by mouth daily.  1   No current facility-administered medications on file prior to visit.   Allergies  Allergen Reactions  . Codeine Nausea And Vomiting  . Penicillins Itching  . Tramadol Itching   Objective: General: Patient is awake, alert, and oriented x 3 and in no acute distress.  Integument: Skin is warm, dry and supple bilateral. Nails are tender, long, thickened and  dystrophic with subungual debris, consistent with onychomycosis, 1-5 bilateral. No signs of infection. No open lesions or preulcerative lesions present bilateral. Remaining integument unremarkable.  Vasculature:  Dorsalis Pedis pulse 2/4 bilateral. Posterior Tibial  pulse  1/4 bilateral.  Capillary fill time <3 sec 1-5 bilateral. Positive hair growth to the level of the digits. Temperature gradient within normal limits. No varicosities present bilateral. No edema present bilateral.   Neurology: The patient has intact sensation measured with a 5.07/10g Semmes Weinstein Monofilament at all pedal sites bilateral . Vibratory sensation diminished bilateral with tuning fork. No Babinski sign present bilateral.   Musculoskeletal: Right>Left bunion noted bilateral. Muscular strength 5/5 in all lower extremity muscular groups bilateral without pain or limitation on range of motion . No tenderness with calf compression bilateral.  Assessment and Plan: Problem List Items Addressed This Visit    None    Visit Diagnoses    Pain due to onychomycosis of nail    -  Primary    Diabetes mellitus with no complication (HCC)          -Examined patient. -Discussed and educated patient on diabetic foot care, especially with  regards to the vascular, neurological and musculoskeletal systems.  -Stressed the importance of good glycemic control and the detriment of not  controlling glucose levels in relation to the foot. -Mechanically debrided all nails 1-5 bilateral using sterile nail nipper and filed with dremel without incident  -Gave toe spacer for right foot -Recommend daily skin emollients -Answered all patient questions -Patient to return  in 3 months for at risk foot care -Patient advised to call the office if any problems or questions arise in the meantime.  Virginia Gilbert, DPM

## 2015-10-11 DIAGNOSIS — E7800 Pure hypercholesterolemia, unspecified: Secondary | ICD-10-CM | POA: Diagnosis not present

## 2015-10-11 DIAGNOSIS — I10 Essential (primary) hypertension: Secondary | ICD-10-CM | POA: Diagnosis not present

## 2015-10-11 DIAGNOSIS — E119 Type 2 diabetes mellitus without complications: Secondary | ICD-10-CM | POA: Diagnosis not present

## 2015-12-27 ENCOUNTER — Ambulatory Visit: Payer: Medicare Other | Admitting: Sports Medicine

## 2016-01-03 ENCOUNTER — Ambulatory Visit (INDEPENDENT_AMBULATORY_CARE_PROVIDER_SITE_OTHER): Payer: Medicare Other | Admitting: Sports Medicine

## 2016-01-03 ENCOUNTER — Encounter: Payer: Self-pay | Admitting: Sports Medicine

## 2016-01-03 DIAGNOSIS — M79676 Pain in unspecified toe(s): Secondary | ICD-10-CM | POA: Diagnosis not present

## 2016-01-03 DIAGNOSIS — B351 Tinea unguium: Secondary | ICD-10-CM

## 2016-01-03 DIAGNOSIS — M79609 Pain in unspecified limb: Principal | ICD-10-CM

## 2016-01-03 DIAGNOSIS — E119 Type 2 diabetes mellitus without complications: Secondary | ICD-10-CM

## 2016-01-03 NOTE — Progress Notes (Signed)
Patient ID: Virginia Gilbert, female   DOB: 10-26-1940, 75 y.o.   MRN: [id number]  Subjective: Virginia Gilbert is a 75 y.o. female patient with history of type 2 diabetes who presents to office today complaining of long, painful nails  while ambulating in shoes; unable to trim. Patient states that the glucose reading this morning was "good".  Patient denies any new changes in medication or new problems. Patient denies any new cramping, numbness, burning or tingling in the legs.  There are no active problems to display for this patient.  Current Outpatient Prescriptions on File Prior to Visit  Medication Sig Dispense Refill  . amLODipine (NORVASC) 2.5 MG tablet Take 2.5 mg by mouth daily.  12  . atorvastatin (LIPITOR) 10 MG tablet Take 10 mg by mouth daily.  1  . FLUOCINOLONE ACETONIDE SCALP 0.01 % OIL USE AS DIRECTED AND LEAVE ON 5 MINUTES THEN RINSE FOR 14 DAYS  0  . hydrochlorothiazide (MICROZIDE) 12.5 MG capsule Take 12.5 mg by mouth daily.  3  . LORazepam (ATIVAN) 0.5 MG tablet TAKE 1 TABLET BY MOUTH 4 TIMES A DAY AS NEEDED  0  . losartan (COZAAR) 100 MG tablet TAKE ONE TABLET BY MOUTH EVERY DAY 30  6  . metFORMIN (GLUCOPHAGE) 1000 MG tablet TAKE ONE TABLET BY MOUTH TWICE A DAY ORALLY  6  . metoprolol succinate (TOPROL-XL) 25 MG 24 hr tablet Take 25 mg by mouth daily.  1   No current facility-administered medications on file prior to visit.   Allergies  Allergen Reactions  . Codeine Nausea And Vomiting  . Penicillins Itching  . Tramadol Itching   Objective: General: Patient is awake, alert, and oriented x 3 and in no acute distress.  Integument: Skin is warm, dry and supple bilateral. Nails are tender, long, thickened and  dystrophic with subungual debris, consistent with onychomycosis, 1-5 bilateral. No signs of infection. No open lesions or preulcerative lesions present bilateral. Remaining integument unremarkable.  Vasculature:  Dorsalis Pedis pulse 2/4 bilateral. Posterior Tibial  pulse  1/4 bilateral.  Capillary fill time <3 sec 1-5 bilateral. Positive hair growth to the level of the digits. Temperature gradient within normal limits. No varicosities present bilateral. No edema present bilateral.   Neurology: The patient has intact sensation measured with a 5.07/10g Semmes Weinstein Monofilament at all pedal sites bilateral . Vibratory sensation diminished bilateral with tuning fork. No Babinski sign present bilateral.   Musculoskeletal: Right>Left bunion noted bilateral. Muscular strength 5/5 in all lower extremity muscular groups bilateral without pain or limitation on range of motion . No tenderness with calf compression bilateral.  Assessment and Plan: Problem List Items Addressed This Visit    None    Visit Diagnoses    Pain due to onychomycosis of nail    -  Primary    Diabetes mellitus with no complication (HCC)          -Examined patient. -Discussed and educated patient on diabetic foot care, especially with  regards to the vascular, neurological and musculoskeletal systems.  -Stressed the importance of good glycemic control and the detriment of not  controlling glucose levels in relation to the foot. -Mechanically debrided all nails 1-5 bilateral using sterile nail nipper and filed with dremel without incident  -Continue with toe spacer for right foot -Recommend daily skin emollients -Answered all patient questions -Patient to return  in 3 months for at risk foot care -Patient advised to call the office if any problems or questions arise in  the meantime.  Virginia Gilbert, DPM

## 2016-01-06 DIAGNOSIS — E119 Type 2 diabetes mellitus without complications: Secondary | ICD-10-CM | POA: Diagnosis not present

## 2016-01-14 DIAGNOSIS — I10 Essential (primary) hypertension: Secondary | ICD-10-CM | POA: Diagnosis not present

## 2016-01-14 DIAGNOSIS — E7800 Pure hypercholesterolemia, unspecified: Secondary | ICD-10-CM | POA: Diagnosis not present

## 2016-01-14 DIAGNOSIS — K7689 Other specified diseases of liver: Secondary | ICD-10-CM | POA: Diagnosis not present

## 2016-01-14 DIAGNOSIS — E119 Type 2 diabetes mellitus without complications: Secondary | ICD-10-CM | POA: Diagnosis not present

## 2016-02-21 DIAGNOSIS — M791 Myalgia: Secondary | ICD-10-CM | POA: Diagnosis not present

## 2016-03-15 DIAGNOSIS — M7061 Trochanteric bursitis, right hip: Secondary | ICD-10-CM | POA: Diagnosis not present

## 2016-03-15 DIAGNOSIS — M5441 Lumbago with sciatica, right side: Secondary | ICD-10-CM | POA: Diagnosis not present

## 2016-03-31 DIAGNOSIS — I10 Essential (primary) hypertension: Secondary | ICD-10-CM | POA: Diagnosis not present

## 2016-03-31 DIAGNOSIS — E119 Type 2 diabetes mellitus without complications: Secondary | ICD-10-CM | POA: Diagnosis not present

## 2016-04-03 ENCOUNTER — Other Ambulatory Visit: Payer: Self-pay | Admitting: Internal Medicine

## 2016-04-03 DIAGNOSIS — M542 Cervicalgia: Secondary | ICD-10-CM

## 2016-04-03 DIAGNOSIS — E119 Type 2 diabetes mellitus without complications: Secondary | ICD-10-CM | POA: Diagnosis not present

## 2016-04-03 DIAGNOSIS — E7800 Pure hypercholesterolemia, unspecified: Secondary | ICD-10-CM | POA: Diagnosis not present

## 2016-04-03 DIAGNOSIS — I10 Essential (primary) hypertension: Secondary | ICD-10-CM | POA: Diagnosis not present

## 2016-04-03 DIAGNOSIS — R0989 Other specified symptoms and signs involving the circulatory and respiratory systems: Secondary | ICD-10-CM

## 2016-04-10 ENCOUNTER — Ambulatory Visit: Payer: Medicare Other | Admitting: Sports Medicine

## 2016-04-12 ENCOUNTER — Ambulatory Visit
Admission: RE | Admit: 2016-04-12 | Discharge: 2016-04-12 | Disposition: A | Discharge status: Home / Self Care | Payer: Medicare Other | Source: Ambulatory Visit | Attending: Internal Medicine | Admitting: Internal Medicine

## 2016-04-12 DIAGNOSIS — I6523 Occlusion and stenosis of bilateral carotid arteries: Secondary | ICD-10-CM | POA: Diagnosis not present

## 2016-04-12 DIAGNOSIS — R0989 Other specified symptoms and signs involving the circulatory and respiratory systems: Secondary | ICD-10-CM

## 2016-04-12 DIAGNOSIS — M542 Cervicalgia: Secondary | ICD-10-CM

## 2016-04-21 ENCOUNTER — Encounter (INDEPENDENT_AMBULATORY_CARE_PROVIDER_SITE_OTHER): Payer: Self-pay | Admitting: GENERAL SURGERY

## 2016-04-24 ENCOUNTER — Ambulatory Visit (INDEPENDENT_AMBULATORY_CARE_PROVIDER_SITE_OTHER): Payer: Medicare Other | Admitting: GENERAL SURGERY

## 2016-04-24 ENCOUNTER — Encounter (INDEPENDENT_AMBULATORY_CARE_PROVIDER_SITE_OTHER): Payer: Self-pay | Admitting: GENERAL SURGERY

## 2016-04-24 DIAGNOSIS — Z853 Personal history of malignant neoplasm of breast: Secondary | ICD-10-CM

## 2016-04-24 DIAGNOSIS — C180 Malignant neoplasm of cecum: Secondary | ICD-10-CM

## 2016-04-24 DIAGNOSIS — C182 Malignant neoplasm of ascending colon: Secondary | ICD-10-CM

## 2016-04-24 DIAGNOSIS — C189 Malignant neoplasm of colon, unspecified: Secondary | ICD-10-CM | POA: Insufficient documentation

## 2016-04-24 NOTE — Patient Instructions (Signed)
Per office instructions - Call Teresa with any questions or concerns ([phone])

## 2016-04-25 NOTE — Progress Notes (Signed)
PATIENT NAME: Selena Stokes, MONTEZUMA [facility] NUMBER:  [id number]  DATE OF SERVICE: 04/24/2016  DATE OF BIRTH:  01-05-1941    PROGRESS NOTE    SUBJECTIVE:  A 75 year old female who had recently been having a change in her bowel habits With occasional small-caliber stools as well as diarrhea.  she was referred to Dr. Chiquita Stokes by Dr. Devoria Stokes.  A colonoscopy demonstrated a lesion in her cecum and proximal ascending colon.  Biopsies demonstrated grade II adenocarcinoma.     I know Selena Stokes from a stage IA left breast cancer in November 2012.  She essentially a high-grade DCIS with microinvasion.  She was receptor negative and received no further therapy after her left mastectomy with sentinel node biopsy.     She has currently been in good health.  She denies any significant cardiac history.  No history of shortness of breath or chest pain.  She has no pulmonary disease except for some occasional seasonal allergies.  She is treated for osteoporosis and hyperlipidemia.  She has no known drug allergies.    OBJECTIVE:  Healthy in appearance, 5 feet 4 inches in height, 145 pounds.  I would make note that she is a nonsmoker.     She has no cervical or supraclavicular lymphadenopathy.  Lungs fields are clear and equal bilaterally.  Her heart is regular with no evidence of murmur.  She did have an occasional extra systole.  Her abdomen is soft, nontender.  I cannot appreciate a mass or fullness in her right lower quadrant.    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    PLAN:  I had Selena Stokes undergo a CT scan of her abdomen on Friday.  I reviewed the scan demonstrating thickening of the lateral cecal wall, but no evidence of lymphadenopathy or hepatic lesions.  There was no ascites.     I had a long discussion with Selena Stokes and her friend.  I gave her a booklet on colon cancer surgery and discussed the concept of a right hemicolectomy I had a very long and candid discussion.  I  believe this can be done with a primary anastomosis without difficulty.  I discussed a transverse incision in the right upper abdomen. I discussed the role of laparoscopic surgery versus open.     After answering many questions.  I believe Selena Stokes would be best served with an open right hemicolectomy.  A laparoscopic approach given her small size would not lend any significant benefit.    Postoperative expectations were discussed in detail and informed consent obtained to proceed in the near future.  We will obtain a preoperative CEA level during her PAT studies.        Selena Leas, MD  Associate Professor   [facility] Department of Surgery            CC:   Selena Fishman, MD   [address], [place], [place], [place] [address]     Selena Cornelia, MD       DD:  04/24/2016 14:01:42  DT:  04/25/2016 08:53:54 NW  D#:  [id number]

## 2016-04-26 ENCOUNTER — Ambulatory Visit: Payer: Medicare Other | Admitting: Podiatry

## 2016-04-28 DIAGNOSIS — C182 Malignant neoplasm of ascending colon: Secondary | ICD-10-CM

## 2016-05-10 ENCOUNTER — Encounter (INDEPENDENT_AMBULATORY_CARE_PROVIDER_SITE_OTHER): Payer: Self-pay | Admitting: GENERAL SURGERY

## 2016-05-10 ENCOUNTER — Ambulatory Visit (INDEPENDENT_AMBULATORY_CARE_PROVIDER_SITE_OTHER): Payer: Medicare Other | Admitting: GENERAL SURGERY

## 2016-05-10 ENCOUNTER — Encounter: Payer: Self-pay | Admitting: Podiatry

## 2016-05-10 ENCOUNTER — Ambulatory Visit (INDEPENDENT_AMBULATORY_CARE_PROVIDER_SITE_OTHER): Payer: Medicare Other | Admitting: Podiatry

## 2016-05-10 VITALS — BP 138/82 | HR 68 | Temp 97.2°F | Ht 64.0 in | Wt 141.4 lb

## 2016-05-10 VITALS — BP 156/78 | HR 87 | Resp 18

## 2016-05-10 DIAGNOSIS — E119 Type 2 diabetes mellitus without complications: Secondary | ICD-10-CM

## 2016-05-10 DIAGNOSIS — B351 Tinea unguium: Secondary | ICD-10-CM

## 2016-05-10 DIAGNOSIS — M79609 Pain in unspecified limb: Principal | ICD-10-CM

## 2016-05-10 DIAGNOSIS — M79676 Pain in unspecified toe(s): Secondary | ICD-10-CM

## 2016-05-10 DIAGNOSIS — C182 Malignant neoplasm of ascending colon: Secondary | ICD-10-CM

## 2016-05-10 DIAGNOSIS — Z08 Encounter for follow-up examination after completed treatment for malignant neoplasm: Secondary | ICD-10-CM

## 2016-05-10 MED ORDER — HYDROCODONE 5 MG-ACETAMINOPHEN 325 MG TABLET
1.0000 | ORAL_TABLET | ORAL | 0 refills | Status: DC | PRN
Start: 2016-05-10 — End: 2016-08-02

## 2016-05-10 NOTE — Patient Instructions (Signed)
Follow up in 3 months. Call or return with any concerns or problems. [phone].

## 2016-05-10 NOTE — Progress Notes (Signed)
   Subjective:    Patient ID: Virginia MillerLillie M Gilbert, female    DOB: 04/18/1941, 75 y.o.   MRN: [id number]  HPI   Patient presents today complaining that her toenails are thick and uncomfortable when walking wearing shoes and request nail debridement Patient is a diabetic without history of foot ulceration,, education reputation Patient was initially evaluated on 01/03/2016 by Dr. Marylene LandStover     Review of Systems  All other systems reviewed and are negative.      Objective:   Physical Exam  Orientated 3 DP and PT pulses 2/4 bilaterally Capillary reflex within normal limits bilaterally Sensation to 10 g monofilament wire intact bilaterally Vibratory sensation diminished bilaterally Hammertoe second The toenails are elongated brittle, deformed, discolored and tender to direct palpation      Assessment & Plan:   Assessment: Diabetic without foot complications Symptomatic onychomycoses 6-10  Plan: Debridement toenails 6-10 mechanically electronically without any bleeding  Reappoint 3 months

## 2016-05-10 NOTE — Patient Instructions (Signed)
Diabetes and Foot Care Diabetes may cause you to have problems because of poor blood supply (circulation) to your feet and legs. This may cause the skin on your feet to become thinner, break easier, and heal more slowly. Your skin may become dry, and the skin may peel and crack. You may also have nerve damage in your legs and feet causing decreased feeling in them. You may not notice minor injuries to your feet that could lead to infections or more serious problems. Taking care of your feet is one of the most important things you can do for yourself.  HOME CARE INSTRUCTIONS  Wear shoes at all times, even in the house. Do not go barefoot. Bare feet are easily injured.  Check your feet daily for blisters, cuts, and redness. If you cannot see the bottom of your feet, use a mirror or ask someone for help.  Wash your feet with warm water (do not use hot water) and mild soap. Then pat your feet and the areas between your toes until they are completely dry. Do not soak your feet as this can dry your skin.  Apply a moisturizing lotion or petroleum jelly (that does not contain alcohol and is unscented) to the skin on your feet and to dry, brittle toenails. Do not apply lotion between your toes.  Trim your toenails straight across. Do not dig under them or around the cuticle. File the edges of your nails with an emery board or nail file.  Do not cut corns or calluses or try to remove them with medicine.  Wear clean socks or stockings every day. Make sure they are not too tight. Do not wear knee-high stockings since they may decrease blood flow to your legs.  Wear shoes that fit properly and have enough cushioning. To break in new shoes, wear them for just a few hours a day. This prevents you from injuring your feet. Always look in your shoes before you put them on to be sure there are no objects inside.  Do not cross your legs. This may decrease the blood flow to your feet.  If you find a minor scrape,  cut, or break in the skin on your feet, keep it and the skin around it clean and dry. These areas may be cleansed with mild soap and water. Do not cleanse the area with peroxide, alcohol, or iodine.  When you remove an adhesive bandage, be sure not to damage the skin around it.  If you have a wound, look at it several times a day to make sure it is healing.  Do not use heating pads or hot water bottles. They may burn your skin. If you have lost feeling in your feet or legs, you may not know it is happening until it is too late.  Make sure your health care provider performs a complete foot exam at least annually or more often if you have foot problems. Report any cuts, sores, or bruises to your health care provider immediately. SEEK MEDICAL CARE IF:   You have an injury that is not healing.  You have cuts or breaks in the skin.  You have an ingrown nail.  You notice redness on your legs or feet.  You feel burning or tingling in your legs or feet.  You have pain or cramps in your legs and feet.  Your legs or feet are numb.  Your feet always feel cold. SEEK IMMEDIATE MEDICAL CARE IF:   There is increasing redness,   swelling, or pain in or around a wound.  There is a red line that goes up your leg.  Pus is coming from a wound.  You develop a fever or as directed by your health care provider.  You notice a bad smell coming from an ulcer or wound.   This information is not intended to replace advice given to you by your health care provider. Make sure you discuss any questions you have with your health care provider.   Document Released: 07/28/2000 Document Revised: 04/02/2013 Document Reviewed: 01/07/2013 Elsevier Interactive Patient Education 2016 Elsevier Inc.  

## 2016-05-11 NOTE — Progress Notes (Signed)
PATIENT NAME: Selena Stokes, Selena Stokes [facility] NUMBER:  [id number]  DATE OF SERVICE: 05/10/2016  DATE OF BIRTH:  1940/10/12    PROGRESS NOTE    SUBJECTIVE:  This 75 year old female presents in followup after a right hemicolectomy on April 28, 2016.  Her postoperative course has been uneventful.     She had demonstrated stage IIA, T3 N0 (0/16) M0, grade I adenocarcinoma of the cecum.     Ms. Blust states that she has been doing well.  She has resumed walking up to a mile per day.    OBJECTIVE:  Exam reveals her transverse incision site in the right upper quadrant to be healing well.  Her staples were removed.    ASSESSMENT:  Stage IIA adenocarcinoma of the cecum.    PLAN:  I have recommended to Ms. Speakes that she undergo a medical oncology evaluation at this point.  Her preoperative CEA level was 8.3 and her preoperative CT scan and intraoperative exploration revealed no other evidence of disease.  In addition, I have recommended that she undergo a followup colonoscopy in 1 year per Dr. Lawrence Marseilles.     She was given a refill for Norco 5/325 mg, 15 tablets with no refills.  I will see her in followup in 3 months.        Arbutus Leas, MD  Associate Professor   [facility] Department of Surgery            CC:   Cedric Fishman, MD   [address] [place]   [place]   [id number]     [facility]   [place], [place] [address]     [address], [place], [place] [address]       DD:  05/10/2016 15:09:38  DT:  05/11/2016 16:49:19 TM  D#:  [id number]

## 2016-05-22 DIAGNOSIS — Z23 Encounter for immunization: Secondary | ICD-10-CM | POA: Diagnosis not present

## 2016-06-07 DIAGNOSIS — H59811 Chorioretinal scars after surgery for detachment, right eye: Secondary | ICD-10-CM | POA: Diagnosis not present

## 2016-06-07 DIAGNOSIS — H524 Presbyopia: Secondary | ICD-10-CM | POA: Diagnosis not present

## 2016-06-07 DIAGNOSIS — E119 Type 2 diabetes mellitus without complications: Secondary | ICD-10-CM | POA: Diagnosis not present

## 2016-07-03 DIAGNOSIS — E7800 Pure hypercholesterolemia, unspecified: Secondary | ICD-10-CM | POA: Diagnosis not present

## 2016-07-03 DIAGNOSIS — E119 Type 2 diabetes mellitus without complications: Secondary | ICD-10-CM | POA: Diagnosis not present

## 2016-07-04 DIAGNOSIS — E119 Type 2 diabetes mellitus without complications: Secondary | ICD-10-CM | POA: Diagnosis not present

## 2016-07-10 DIAGNOSIS — E119 Type 2 diabetes mellitus without complications: Secondary | ICD-10-CM | POA: Diagnosis not present

## 2016-07-10 DIAGNOSIS — E7800 Pure hypercholesterolemia, unspecified: Secondary | ICD-10-CM | POA: Diagnosis not present

## 2016-07-10 DIAGNOSIS — I10 Essential (primary) hypertension: Secondary | ICD-10-CM | POA: Diagnosis not present

## 2016-07-17 DIAGNOSIS — I10 Essential (primary) hypertension: Secondary | ICD-10-CM | POA: Diagnosis not present

## 2016-07-17 DIAGNOSIS — E119 Type 2 diabetes mellitus without complications: Secondary | ICD-10-CM | POA: Diagnosis not present

## 2016-07-17 DIAGNOSIS — E7800 Pure hypercholesterolemia, unspecified: Secondary | ICD-10-CM | POA: Diagnosis not present

## 2016-07-19 DIAGNOSIS — I10 Essential (primary) hypertension: Secondary | ICD-10-CM | POA: Diagnosis not present

## 2016-07-19 DIAGNOSIS — E7800 Pure hypercholesterolemia, unspecified: Secondary | ICD-10-CM | POA: Diagnosis not present

## 2016-07-19 DIAGNOSIS — E119 Type 2 diabetes mellitus without complications: Secondary | ICD-10-CM | POA: Diagnosis not present

## 2016-08-02 ENCOUNTER — Encounter (INDEPENDENT_AMBULATORY_CARE_PROVIDER_SITE_OTHER): Payer: Self-pay | Admitting: GENERAL SURGERY

## 2016-08-02 ENCOUNTER — Ambulatory Visit (INDEPENDENT_AMBULATORY_CARE_PROVIDER_SITE_OTHER): Payer: Medicare Other | Admitting: GENERAL SURGERY

## 2016-08-02 VITALS — BP 146/89 | HR 81 | Temp 98.0°F | Ht 63.0 in | Wt 134.8 lb

## 2016-08-02 DIAGNOSIS — Z08 Encounter for follow-up examination after completed treatment for malignant neoplasm: Secondary | ICD-10-CM

## 2016-08-02 DIAGNOSIS — D0512 Intraductal carcinoma in situ of left breast: Secondary | ICD-10-CM

## 2016-08-02 DIAGNOSIS — C182 Malignant neoplasm of ascending colon: Secondary | ICD-10-CM

## 2016-08-02 NOTE — Patient Instructions (Signed)
Call or return with any concerns or problems. [phone].

## 2016-08-03 NOTE — Progress Notes (Signed)
PATIENT NAME: Selena Stokes, Selena Stokes [facility] NUMBER:  [id number]  DATE OF SERVICE: 08/02/2016  DATE OF BIRTH:  [birth year]    PROGRESS NOTE    SUBJECTIVE:  Followup for left breast cancer surveillance exam.  She is 5 years out.  In addition, she had a right hemicolectomy for a stage IIA right colon cancer in September.  She is followed at the [facility].     She has been having some issues with anxiety and has an appointment scheduled with a psychiatrist in January.    OBJECTIVE:  Healthy in appearance, 5 feet 3 inches in height and 134 pounds.  No cervical, supraclavicular or axillary lymphadenopathy.  Lung fields clear and equal.  Heart is regular.  Right breast without suspicious skin or nipple changes.  No evidence of dominant masses throughout.  The left chest wall is very well healed.  No evidence of nodularity or suspicious lesions.  No left upper extremity swelling.  Abdomen is soft, without hepatomegaly.  Well-healed transverse incision site in the right upper quadrant.  No abdominal masses or hernias.    ASSESSMENT:  1. 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  2. Stage IIA right colon cancer, September 2017.    PLAN:  At this point, Selena Stokes will continue her followup with her medical oncologist.  She will see me back as needed.  She understands to contact me at any time with concerns or problems.        Arbutus Leas, MD  Associate Professor   [facility] Department of Surgery              DD:  08/02/2016 15:09:26  DT:  08/03/2016 08:20:47 CB  D#:  [id number]

## 2016-08-18 ENCOUNTER — Ambulatory Visit: Payer: Medicare Other | Admitting: Podiatry

## 2016-08-29 ENCOUNTER — Ambulatory Visit: Payer: Medicare Other | Admitting: Sports Medicine

## 2016-10-05 DIAGNOSIS — E119 Type 2 diabetes mellitus without complications: Secondary | ICD-10-CM | POA: Diagnosis not present

## 2016-10-05 DIAGNOSIS — I10 Essential (primary) hypertension: Secondary | ICD-10-CM | POA: Diagnosis not present

## 2016-10-05 DIAGNOSIS — M810 Age-related osteoporosis without current pathological fracture: Secondary | ICD-10-CM | POA: Diagnosis not present

## 2016-10-11 DIAGNOSIS — I10 Essential (primary) hypertension: Secondary | ICD-10-CM | POA: Diagnosis not present

## 2016-10-11 DIAGNOSIS — Z23 Encounter for immunization: Secondary | ICD-10-CM | POA: Diagnosis not present

## 2016-10-11 DIAGNOSIS — E7800 Pure hypercholesterolemia, unspecified: Secondary | ICD-10-CM | POA: Diagnosis not present

## 2016-10-11 DIAGNOSIS — E119 Type 2 diabetes mellitus without complications: Secondary | ICD-10-CM | POA: Diagnosis not present

## 2016-10-11 DIAGNOSIS — Z0000 Encounter for general adult medical examination without abnormal findings: Secondary | ICD-10-CM | POA: Diagnosis not present

## 2016-10-17 DIAGNOSIS — E039 Hypothyroidism, unspecified: Secondary | ICD-10-CM

## 2016-10-17 DIAGNOSIS — C50919 Malignant neoplasm of unspecified site of unspecified female breast: Secondary | ICD-10-CM

## 2016-10-17 DIAGNOSIS — G4700 Insomnia, unspecified: Secondary | ICD-10-CM

## 2016-10-17 DIAGNOSIS — E785 Hyperlipidemia, unspecified: Secondary | ICD-10-CM

## 2016-10-17 DIAGNOSIS — F0390 Unspecified dementia without behavioral disturbance: Secondary | ICD-10-CM

## 2016-10-17 DIAGNOSIS — M810 Age-related osteoporosis without current pathological fracture: Secondary | ICD-10-CM

## 2016-10-17 DIAGNOSIS — K219 Gastro-esophageal reflux disease without esophagitis: Secondary | ICD-10-CM

## 2016-10-17 DIAGNOSIS — C180 Malignant neoplasm of cecum: Secondary | ICD-10-CM

## 2016-10-18 DIAGNOSIS — F0390 Unspecified dementia without behavioral disturbance: Secondary | ICD-10-CM

## 2016-10-19 ENCOUNTER — Encounter (HOSPITAL_BASED_OUTPATIENT_CLINIC_OR_DEPARTMENT_OTHER): Payer: Self-pay | Admitting: Internal Medicine

## 2016-10-30 ENCOUNTER — Inpatient Hospital Stay (HOSPITAL_BASED_OUTPATIENT_CLINIC_OR_DEPARTMENT_OTHER): Payer: Medicare Other | Admitting: Internal Medicine

## 2016-10-30 ENCOUNTER — Telehealth (HOSPITAL_BASED_OUTPATIENT_CLINIC_OR_DEPARTMENT_OTHER): Payer: Self-pay | Admitting: Internal Medicine

## 2016-11-06 ENCOUNTER — Ambulatory Visit (INDEPENDENT_AMBULATORY_CARE_PROVIDER_SITE_OTHER): Payer: Medicare Other | Admitting: Internal Medicine

## 2016-11-06 ENCOUNTER — Encounter (HOSPITAL_BASED_OUTPATIENT_CLINIC_OR_DEPARTMENT_OTHER): Payer: Self-pay | Admitting: Internal Medicine

## 2016-11-06 VITALS — BP 175/92 | HR 75 | Ht 63.0 in | Wt 134.2 lb

## 2016-11-06 DIAGNOSIS — F0391 Unspecified dementia with behavioral disturbance: Secondary | ICD-10-CM

## 2016-11-06 NOTE — Progress Notes (Signed)
[facility]  [address]  [place] [address]        Patient name:  Selena Stokes  MRN:  [id number]  DOB:  1940-10-16  DATE:  11/06/2016    BP (!) 175/92  Pulse 75  Ht 1.6 m (5\' 3" )  Wt 60.9 kg (134 lb 3.2 oz)  LMP 06/13/1993  BMI 23.77 kg/m2      SUBJECTIVE:  First office appt. For me.  She spent some time at [facility] psych unit due to aggressive behavior at [facility] place which occurred shortly after being admitted there.  She reutrned to [facility] place and same thing happened again  And she ended up on [place] for brief stay,  During our vsiit, she demonstrated no behavioral problems what soever.  She reports everything has been calm at assisted living facility since she went back the second time.  She denies any behavioral problems, but she is alone today, with only a driver from the facility.  She reports favorable mood.  Ok sleep and appetite. No ar/se from meds.    OBJECTIVE:    Orientation: Fully oriented to person, place, time and situation.  Marland Kitchen    Appearance:  neatly dressed.    Eye Contact:  good.    Behavior:  cooperative.    Attention:  good.    Speech:  normal rate and volume.    Motor:  no psychomotor retardation or agitation.    Mood:  euthymic.    Affect:  constricted.    Thought Process:  linear.    Thought Content:  no paranoia or delusions.    Suicidal Ideation:  none.    Homicidal Ideation:  none  Perception:  no hallucinations endorsed  Cognition:  concrete.    Insight:  fair.    Judgement:  fair.        ASSESSMENT AND PLAN:  Problem List Items Addressed This Visit     None      Visit Diagnoses     Dementia with behavioral disturbance, unspecified dementia type    -  Primary            PLAN:    dat mild with beh problems- stable at present.  If behqavior remains stable , will try to simplify drug regimen.        Cleon Dew, MD  11/06/2016, 15:21

## 2016-11-06 NOTE — Patient Instructions (Signed)
Continue current medications. 

## 2016-11-23 ENCOUNTER — Encounter (INDEPENDENT_AMBULATORY_CARE_PROVIDER_SITE_OTHER): Payer: Self-pay | Admitting: Internal Medicine

## 2016-12-02 ENCOUNTER — Emergency Department (HOSPITAL_COMMUNITY): Payer: Medicare Other

## 2016-12-02 ENCOUNTER — Encounter (HOSPITAL_COMMUNITY): Payer: Self-pay | Admitting: Emergency Medicine

## 2016-12-02 ENCOUNTER — Emergency Department (HOSPITAL_COMMUNITY)
Admission: EM | Admit: 2016-12-02 | Discharge: 2016-12-02 | Disposition: A | Discharge status: Home / Self Care | Payer: Medicare Other | Attending: Emergency Medicine | Admitting: Emergency Medicine

## 2016-12-02 DIAGNOSIS — X58XXXA Exposure to other specified factors, initial encounter: Secondary | ICD-10-CM | POA: Diagnosis not present

## 2016-12-02 DIAGNOSIS — Y929 Unspecified place or not applicable: Secondary | ICD-10-CM | POA: Diagnosis not present

## 2016-12-02 DIAGNOSIS — Y999 Unspecified external cause status: Secondary | ICD-10-CM | POA: Diagnosis not present

## 2016-12-02 DIAGNOSIS — M1712 Unilateral primary osteoarthritis, left knee: Secondary | ICD-10-CM | POA: Diagnosis not present

## 2016-12-02 DIAGNOSIS — E119 Type 2 diabetes mellitus without complications: Secondary | ICD-10-CM | POA: Diagnosis not present

## 2016-12-02 DIAGNOSIS — Z7984 Long term (current) use of oral hypoglycemic drugs: Secondary | ICD-10-CM | POA: Diagnosis not present

## 2016-12-02 DIAGNOSIS — Y939 Activity, unspecified: Secondary | ICD-10-CM | POA: Insufficient documentation

## 2016-12-02 DIAGNOSIS — S83412A Sprain of medial collateral ligament of left knee, initial encounter: Secondary | ICD-10-CM | POA: Diagnosis not present

## 2016-12-02 DIAGNOSIS — S8992XA Unspecified injury of left lower leg, initial encounter: Secondary | ICD-10-CM | POA: Diagnosis present

## 2016-12-02 DIAGNOSIS — Z79899 Other long term (current) drug therapy: Secondary | ICD-10-CM | POA: Insufficient documentation

## 2016-12-02 DIAGNOSIS — M25562 Pain in left knee: Secondary | ICD-10-CM | POA: Diagnosis not present

## 2016-12-02 HISTORY — DX: Type 2 diabetes mellitus without complications: E11.9

## 2016-12-02 MED ORDER — DICLOFENAC SODIUM 1 % TD GEL: 4.0000 g | Freq: Four times a day (QID) | TRANSDERMAL | 0 refills | Status: DC

## 2016-12-02 MED ORDER — NAPROXEN 500 MG PO TABS: 500.0000 mg | ORAL_TABLET | Freq: Two times a day (BID) | ORAL | 0 refills | Status: AC

## 2016-12-02 MED ORDER — NAPROXEN 500 MG PO TABS
500.0000 mg | ORAL_TABLET | Freq: Once | ORAL | Status: AC
  Administered 2016-12-02: 500 mg via ORAL
  Filled 2016-12-02: qty 1

## 2016-12-02 NOTE — ED Notes (Signed)
Patient transported to X-ray 

## 2016-12-02 NOTE — ED Provider Notes (Signed)
[facility]-EMERGENCY DEPT Provider Note   CSN: [id number] Arrival date & time: 12/02/16  1848     History   Chief Complaint Chief Complaint  Patient presents with  . Knee Pain    HPI Virginia Gilbert is a 76 y.o. female.  The history is provided by the patient.  Knee Pain   This is a new problem. Episode onset: 1 week ago. The problem occurs constantly. The problem has not changed since onset.The pain is present in the left knee. The quality of the pain is described as aching. The pain is moderate. Pertinent negatives include full range of motion and no stiffness. She has tried nothing for the symptoms. There has been no history of extremity trauma.    Past Medical History:  Diagnosis Date  . Diabetes mellitus without complication (HCC)     There are no active problems to display for this patient.   History reviewed. No pertinent surgical history.  OB History    No data available       Home Medications    Prior to Admission medications   Medication Sig Start Date End Date Taking? Authorizing Provider  amLODipine (NORVASC) 2.5 MG tablet Take 2.5 mg by mouth daily. 03/05/15   Historical Provider, MD  atorvastatin (LIPITOR) 10 MG tablet Take 10 mg by mouth daily. 01/20/15   Historical Provider, MD  FLUOCINOLONE ACETONIDE SCALP 0.01 % OIL USE AS DIRECTED AND LEAVE ON 5 MINUTES THEN RINSE FOR 14 DAYS 02/18/15   Historical Provider, MD  hydrochlorothiazide (MICROZIDE) 12.5 MG capsule Take 12.5 mg by mouth daily. 12/14/14   Historical Provider, MD  LORazepam (ATIVAN) 0.5 MG tablet TAKE 1 TABLET BY MOUTH 4 TIMES A DAY AS NEEDED 02/09/15   Historical Provider, MD  losartan (COZAAR) 100 MG tablet TAKE ONE TABLET BY MOUTH EVERY DAY 30 03/06/15   Historical Provider, MD  metFORMIN (GLUCOPHAGE) 1000 MG tablet TAKE ONE TABLET BY MOUTH TWICE A DAY ORALLY 02/26/15   Historical Provider, MD  metoprolol succinate (TOPROL-XL) 25 MG 24 hr tablet Take 25 mg by mouth daily. 02/21/15   Historical Provider,  MD    Family History No family history on file.  Social History Social History  Substance Use Topics  . Smoking status: Never Smoker  . Smokeless tobacco: Never Used  . Alcohol use No     Allergies   Codeine; Penicillins; and Tramadol   Review of Systems Review of Systems  Musculoskeletal: Negative for stiffness.  All other systems reviewed and are negative.    Physical Exam Updated Vital Signs BP (!) 180/81 (BP Location: Right Arm)   Pulse 84   Temp 97.5 F (36.4 C) (Oral)   Resp 18   SpO2 99%   Physical Exam  Constitutional: She is oriented to person, place, and time. She appears well-developed and well-nourished. No distress.  HENT:  Head: Normocephalic.  Nose: Nose normal.  Eyes: Conjunctivae are normal.  Neck: Neck supple. No tracheal deviation present.  Cardiovascular: Normal rate and regular rhythm.   Pulmonary/Chest: Effort normal. No respiratory distress.  Abdominal: Soft. She exhibits no distension.  Musculoskeletal:       Left knee: She exhibits normal range of motion, no swelling, no effusion, no deformity, normal alignment and no MCL laxity. Tenderness (over MCL but sparing joint line, pain to confrontation) found.  Neurological: She is alert and oriented to person, place, and time.  Skin: Skin is warm and dry.  Psychiatric: She has a normal mood and affect.  ED Treatments / Results  Labs (all labs ordered are listed, but only abnormal results are displayed) Labs Reviewed - No data to display  EKG  EKG Interpretation None       Radiology Dg Knee Complete 4 Views Left  Result Date: 12/02/2016 CLINICAL DATA:  Knee pain without a known injury. EXAM: LEFT KNEE - COMPLETE 4+ VIEW COMPARISON:  None. FINDINGS: No fracture. No subluxation or dislocation. Mild hypertrophic spurring visible in all 3 compartments. No joint effusion. IMPRESSION: Mild degenerative changes without acute bony abnormality. Electronically Signed   By: Kennith Center  M.D.   On: 12/02/2016 19:21    Procedures Procedures (including critical care time)  Medications Ordered in ED Medications  naproxen (NAPROSYN) tablet 500 mg (not administered)     Initial Impression / Assessment and Plan / ED Course  I have reviewed the triage vital signs and the nursing notes.  Pertinent labs & imaging results that were available during my care of the patient were reviewed by me and considered in my medical decision making (see chart for details).     76 y.o. female presents with MCL tenderness and pain with over the last week. Plain film negative. Pt given instructions for supportive care including NSAIDs, rest, ice, compression, and elevation to help alleviate symptoms. Plan to follow up with PCP as needed and return precautions discussed for worsening or new concerning symptoms.   Final Clinical Impressions(s) / ED Diagnoses   Final diagnoses:  Sprain of medial collateral ligament of left knee, initial encounter  Primary osteoarthritis of left knee    New Prescriptions Discharge Medication List as of 12/02/2016  7:42 PM    START taking these medications   Details  diclofenac sodium (VOLTAREN) 1 % GEL Apply 4 g topically 4 (four) times daily., Starting Sat 12/02/2016, Print    naproxen (NAPROSYN) 500 MG tablet Take 1 tablet (500 mg total) by mouth 2 (two) times daily with a meal., Starting Sat 12/02/2016, Until Wed 12/06/2016, Print         Lyndal Pulley, MD 12/04/16 [phone]

## 2016-12-02 NOTE — ED Triage Notes (Signed)
Patient here from home with complaints of left knee pain. Denies injury/trauma. Reports pain x1 week.

## 2016-12-03 DIAGNOSIS — M1712 Unilateral primary osteoarthritis, left knee: Secondary | ICD-10-CM | POA: Diagnosis not present

## 2016-12-15 ENCOUNTER — Other Ambulatory Visit: Payer: Self-pay

## 2016-12-15 NOTE — Patient Outreach (Signed)
[facility] Care Management  12/15/2016  [name] 05/19/1941 [id number]   Medication Adherence to Virginia Gilbert. Mare FerrariLillie Gilbert calling Virginia Gilbert for her losartan 100 mg  Virginia Gilbert, Virginia Gilbert said she already pick up her medication from [facility] on May 1,2018 and she received a 90 days supply .   Virginia AbedAna Gilbert CPhT Pharmacy Technician [facility] Care Management Direct Dial [phone]  Fax [phone] [email or web address]

## 2016-12-18 ENCOUNTER — Encounter (HOSPITAL_BASED_OUTPATIENT_CLINIC_OR_DEPARTMENT_OTHER): Payer: Medicare Other | Admitting: Internal Medicine

## 2016-12-21 DIAGNOSIS — M1712 Unilateral primary osteoarthritis, left knee: Secondary | ICD-10-CM | POA: Diagnosis not present

## 2016-12-25 ENCOUNTER — Other Ambulatory Visit (INDEPENDENT_AMBULATORY_CARE_PROVIDER_SITE_OTHER): Payer: Self-pay

## 2016-12-27 DIAGNOSIS — E7800 Pure hypercholesterolemia, unspecified: Secondary | ICD-10-CM | POA: Diagnosis not present

## 2016-12-27 DIAGNOSIS — E119 Type 2 diabetes mellitus without complications: Secondary | ICD-10-CM | POA: Diagnosis not present

## 2016-12-27 DIAGNOSIS — I10 Essential (primary) hypertension: Secondary | ICD-10-CM | POA: Diagnosis not present

## 2017-01-01 ENCOUNTER — Encounter (HOSPITAL_BASED_OUTPATIENT_CLINIC_OR_DEPARTMENT_OTHER): Payer: Self-pay | Admitting: Internal Medicine

## 2017-01-01 ENCOUNTER — Ambulatory Visit (INDEPENDENT_AMBULATORY_CARE_PROVIDER_SITE_OTHER): Payer: Medicare Other | Admitting: Internal Medicine

## 2017-01-01 VITALS — BP 167/94 | HR 76 | Ht 63.0 in | Wt 134.0 lb

## 2017-01-01 DIAGNOSIS — F329 Major depressive disorder, single episode, unspecified: Secondary | ICD-10-CM

## 2017-01-01 DIAGNOSIS — F0391 Unspecified dementia with behavioral disturbance: Secondary | ICD-10-CM

## 2017-01-01 MED ORDER — SERTRALINE 25 MG TABLET
25.0000 mg | ORAL_TABLET | Freq: Every day | ORAL | 2 refills | Status: DC
Start: 2017-01-01 — End: 2017-02-12

## 2017-01-01 NOTE — Progress Notes (Signed)
[facility]  [address]  [place]  [address]        Patient name:  Selena Stokes  MRN:  [id number]  DOB:  May 05, 1941  DATE:  01/01/2017    BP (!) 167/94   Pulse 76   Ht 1.6 m (5\' 3" )   Wt 60.8 kg (134 lb)   LMP 06/13/1993   BMI 23.74 kg/m2      SUBJECTIVE:  Since last visit, pt continue to live at [facility]. And does not like it much.  Says no one comes to visit.  Brother is handling her affairs.  She is becoming more depressed, and I spoke with staff at facility who alsothink she is getting depressed.  There has not been any recurrence of aggressive or agitated behavior.  Still with some short term memory problems, but not too severe.  Denies feeling suicidal, or having any hallucinations.      OBJECTIVE:    Orientation: oriented to day date, month yr., not location.    Appearance:  neatly dressed.    Eye Contact:  good.    Behavior:  withdrawn.    Attention:  good.    Speech:  slow and deliberate.    Motor:  psychomotor retardation.    Mood:  dysphoric.    Affect:  congruent to mood.    Thought Process:  linear.    Thought Content:  no paranoia or delusions.    Suicidal Ideation:  none.    Homicidal Ideation:  none  Perception:  no hallucinations endorsed  Cognition:  some abstract ability.    Insight:  fair.    Judgement:  fair.        ASSESSMENT AND PLAN:  Problem List Items Addressed This Visit     None      Visit Diagnoses     Dementia with behavioral disturbance, unspecified dementia type    -  Primary    MDD (major depressive disorder), single episode                PLAN:    Add zoloft.  May try to wean off trileptal and zyprexa, if no further agitation or psychosis        Cleon Dew, MD  01/01/2017, 14:31

## 2017-01-01 NOTE — Patient Instructions (Signed)
Begin zoloft as prescribed.  Continue other meds as before

## 2017-02-12 ENCOUNTER — Ambulatory Visit (INDEPENDENT_AMBULATORY_CARE_PROVIDER_SITE_OTHER): Payer: Medicare Other | Admitting: Internal Medicine

## 2017-02-12 ENCOUNTER — Encounter (HOSPITAL_BASED_OUTPATIENT_CLINIC_OR_DEPARTMENT_OTHER): Payer: Self-pay | Admitting: Internal Medicine

## 2017-02-12 VITALS — BP 151/84 | HR 82 | Ht 63.0 in | Wt 132.2 lb

## 2017-02-12 DIAGNOSIS — F0391 Unspecified dementia with behavioral disturbance: Secondary | ICD-10-CM

## 2017-02-12 DIAGNOSIS — F329 Major depressive disorder, single episode, unspecified: Secondary | ICD-10-CM

## 2017-02-12 MED ORDER — TEMAZEPAM 7.5 MG CAPSULE
7.5000 mg | ORAL_CAPSULE | Freq: Every evening | ORAL | 3 refills | Status: AC
Start: 2017-02-12 — End: ?

## 2017-02-12 MED ORDER — SERTRALINE 50 MG TABLET
50.0000 mg | ORAL_TABLET | Freq: Every day | ORAL | 2 refills | Status: DC
Start: 2017-02-12 — End: 2017-05-03

## 2017-02-12 NOTE — Patient Instructions (Signed)
Increase zoloft to 50 mg daily  Take temazepam 7.5 mg nightly for insomnia  Stop taking zyprexa.

## 2017-02-13 NOTE — Progress Notes (Signed)
[facility]  [address]  [place] [address]        Patient name:  Selena Stokes  MRN:  [id number]  DOB:  June 23, 1941  DATE:  02/12/2017    BP (!) 151/84  Pulse 82  Ht 1.6 m (5\' 3" )  Wt 60 kg (132 lb 3.2 oz)  LMP 06/13/1993  BMI 23.42 kg/m2      SUBJECTIVE:  Came alone today.  Still depressed.  Unhappy with living arrangements.  No recent psychotic symptoms reported.  Not sleeping well since she ran out of restoril recently.  Also complains of severe sense of restlessness and inability to sit still(?akasthesia)  OBJECTIVE:    Orientation: Fully oriented to person, place, time and situation.  Marland Kitchen    Appearance:  casually dressed.    Eye Contact:  good.    Behavior:  cooperative.    Attention:  good.    Speech:  somewhat reduced rate.    Motor:  psychomotor retardation.    Mood:  dysphoric.    Affect:  congruent to mood.    Thought Process:  linear.    Thought Content:  no paranoia or delusions.    Suicidal Ideation:  none.    Homicidal Ideation:  none  Perception:  no hallucinations endorsed  Cognition:  concrete.    Insight:  fair.    Judgement:  fair.        ASSESSMENT AND PLAN:  Problem List Items Addressed This Visit     None      MDD   Mild dementia      PLAN:    Titrate zoloft.  continue to wean restoril  Dc zyprexa.        Cleon Dew, MD  02/13/2017, 16:24

## 2017-02-16 DIAGNOSIS — M5441 Lumbago with sciatica, right side: Secondary | ICD-10-CM | POA: Diagnosis not present

## 2017-02-16 DIAGNOSIS — M25551 Pain in right hip: Secondary | ICD-10-CM | POA: Diagnosis not present

## 2017-03-04 DIAGNOSIS — C189 Malignant neoplasm of colon, unspecified: Secondary | ICD-10-CM

## 2017-03-06 DIAGNOSIS — C187 Malignant neoplasm of sigmoid colon: Secondary | ICD-10-CM

## 2017-03-08 DIAGNOSIS — C784 Secondary malignant neoplasm of small intestine: Secondary | ICD-10-CM

## 2017-03-08 DIAGNOSIS — C189 Malignant neoplasm of colon, unspecified: Secondary | ICD-10-CM

## 2017-03-08 DIAGNOSIS — C786 Secondary malignant neoplasm of retroperitoneum and peritoneum: Secondary | ICD-10-CM

## 2017-03-09 DIAGNOSIS — Z09 Encounter for follow-up examination after completed treatment for conditions other than malignant neoplasm: Secondary | ICD-10-CM

## 2017-03-26 ENCOUNTER — Encounter (INDEPENDENT_AMBULATORY_CARE_PROVIDER_SITE_OTHER): Payer: Self-pay | Admitting: GENERAL SURGERY

## 2017-03-26 ENCOUNTER — Ambulatory Visit (INDEPENDENT_AMBULATORY_CARE_PROVIDER_SITE_OTHER): Payer: Medicare Other | Admitting: GENERAL SURGERY

## 2017-03-26 VITALS — BP 110/66 | HR 76 | Temp 98.0°F | Ht 64.0 in | Wt 118.6 lb

## 2017-03-26 DIAGNOSIS — Z08 Encounter for follow-up examination after completed treatment for malignant neoplasm: Secondary | ICD-10-CM

## 2017-03-26 DIAGNOSIS — C189 Malignant neoplasm of colon, unspecified: Secondary | ICD-10-CM

## 2017-03-26 MED ORDER — HYDROCODONE 5 MG-ACETAMINOPHEN 325 MG TABLET
1.0000 | ORAL_TABLET | Freq: Three times a day (TID) | ORAL | 0 refills | Status: AC | PRN
Start: 2017-03-26 — End: ?

## 2017-03-26 NOTE — Patient Instructions (Signed)
Follow up in 3 weeks. Call or return with any concerns or problems. [phone].

## 2017-03-27 NOTE — Progress Notes (Signed)
PATIENT NAME: FIONNUALA, HEMMERICH [facility] NUMBER:  [id number]  DATE OF SERVICE: 03/26/2017  DATE OF BIRTH:  1941/06/13    PROGRESS NOTE    SUBJECTIVE:  This 76 year old female presents in followup after a palliative exploratory laparotomy on March 08, 2017.     Ms. Lineberry had presented to the hospital through the Emergency Department with a picture consistent with a partial small-bowel obstruction.  It appeared initially that she might be partially obstructed at the level of her ileocolic anastomosis.  Ms. 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  She was treated with a right hemicolectomy on April 28, 2016.  She presented through the Emergency Department on March 04, 2017, with this partial obstructive picture.  It was found to be unusual given such a low-grade cancer for her to have a recurrence.  I proceeded with a colonoscopy on March 06, 2017 and found a near obstructing lesion in her sigmoid colon.  The biopsies proved to be adenocarcinoma.     I proceeded with an exploration on March 08, 2017.  She was found to have carcinomatosis throughout the omentum as well as small bowel mesentery.  She had dilated small bowel proximal to the ileocolic anastomosis.  However, I could not quite get to the anastomosis due to dense adhesions.  She did require a partial small-bowel resection of jejunum in areas where the carcinomatosis was felt to be pretty significant and impending an obstructive issue.  After that resection, I proceeded with a loop ileostomy for decompression.  I would make note that the pelvis was explored and while this sigmoid mass was movable, I elected to not proceed with sigmoid resection given the extent of her disease.     Her postoperative course has been uneventful.  Mrs. Mcclatchey has recovered well.  She has on several occasions declined the option of a medical oncology referral for discussion regarding palliative chemotherapy.  I have also discussed this with her  brother.    OBJECTIVE:  Exam demonstrates her midline wound to be healing well.  Her staples were removed in the hospital.  The loop ileostomy is healthy.  Her bag fits securely.    ASSESSMENT:  Stage IV colon cancer with carcinomatosis.    PLAN:  I again discussed the nature of her disease and offered a medical oncology opinion for palliative discussion.  Ms. Labra states that she is not interested.  I will also discuss this again with her brother.  I did give her a refill for Norco 5/325, 30 tablets with no refills.  I will see her back in 3 weeks for repeat   evaluation and at that point I informed her that I would plan on involving hospice care.  She understands and agrees.        Arbutus Leas, MD  Associate Professor   [facility] Department of Surgery              DD:  03/26/2017 12:39:46  DT:  03/27/2017 08:30:50 DW  D#:  [id number]

## 2017-04-18 ENCOUNTER — Encounter (INDEPENDENT_AMBULATORY_CARE_PROVIDER_SITE_OTHER): Payer: Self-pay | Admitting: GENERAL SURGERY

## 2017-04-18 ENCOUNTER — Ambulatory Visit (INDEPENDENT_AMBULATORY_CARE_PROVIDER_SITE_OTHER): Payer: Medicare Other | Admitting: GENERAL SURGERY

## 2017-04-18 VITALS — BP 111/70 | HR 95 | Temp 98.0°F | Ht 64.0 in | Wt 117.6 lb

## 2017-04-18 DIAGNOSIS — C189 Malignant neoplasm of colon, unspecified: Secondary | ICD-10-CM

## 2017-04-18 DIAGNOSIS — Z08 Encounter for follow-up examination after completed treatment for malignant neoplasm: Secondary | ICD-10-CM

## 2017-04-18 NOTE — Patient Instructions (Signed)
Call or return with any concerns or problems. [phone].

## 2017-04-19 NOTE — Progress Notes (Signed)
PATIENT NAME: Selena Stokes, Selena Stokes [facility] NUMBER:  [id number]  DATE OF SERVICE: 04/18/2017  DATE OF BIRTH:  1941/07/18    PROGRESS NOTE    SUBJECTIVE:  A 76 year old female with metastatic colon cancer and carcinomatosis who presents in followup.  She is accompanied by her brother, Josph Macho, and another family member.  She is currently in an assisted living facility here in [place].      She feels somewhat depressed, but has otherwise been seeing to her activities of daily living and functioning well with her ileostomy.    OBJECTIVE:  Exam demonstrates no abdominal distention, obstructive signs or symptoms.  She has no abdominal pain.  Stoma is healthy, well healed midline abdominal wound.    ASSESSMENT:  Stage IV carcinoma of the colon, carcinomatosis.    PLAN:  I again had a long discussion with Yoceline and her family.  They are all in agreement.  They do not wish to proceed with a medical oncology opinion because they do not wish to proceed with any palliative intervention.  I suggested proceeding with hospice and they are in favor of that.  They are familiar with a few of the employees of hospice and want to proceed with that referral.  They understand to contact me at any time in the future with concerns or problems.  We will proceed with hospice involvement.        Arbutus Leas, MD  Associate Professor   [facility] Department of Surgery              DD:  04/18/2017 13:31:55  DT:  04/19/2017 05:06:00 LL  D#:  [id number]

## 2017-04-30 ENCOUNTER — Encounter (HOSPITAL_BASED_OUTPATIENT_CLINIC_OR_DEPARTMENT_OTHER): Payer: Self-pay | Admitting: Internal Medicine

## 2017-04-30 DIAGNOSIS — E119 Type 2 diabetes mellitus without complications: Secondary | ICD-10-CM | POA: Diagnosis not present

## 2017-04-30 DIAGNOSIS — R109 Unspecified abdominal pain: Secondary | ICD-10-CM | POA: Diagnosis not present

## 2017-04-30 DIAGNOSIS — Z0000 Encounter for general adult medical examination without abnormal findings: Secondary | ICD-10-CM | POA: Diagnosis not present

## 2017-04-30 DIAGNOSIS — Z23 Encounter for immunization: Secondary | ICD-10-CM | POA: Diagnosis not present

## 2017-04-30 DIAGNOSIS — I10 Essential (primary) hypertension: Secondary | ICD-10-CM | POA: Diagnosis not present

## 2017-05-03 ENCOUNTER — Other Ambulatory Visit (INDEPENDENT_AMBULATORY_CARE_PROVIDER_SITE_OTHER): Payer: Self-pay | Admitting: Internal Medicine

## 2017-05-03 DIAGNOSIS — E7800 Pure hypercholesterolemia, unspecified: Secondary | ICD-10-CM | POA: Diagnosis not present

## 2017-05-03 DIAGNOSIS — I10 Essential (primary) hypertension: Secondary | ICD-10-CM | POA: Diagnosis not present

## 2017-05-03 DIAGNOSIS — E119 Type 2 diabetes mellitus without complications: Secondary | ICD-10-CM | POA: Diagnosis not present

## 2017-05-03 MED ORDER — SERTRALINE 50 MG TABLET
50.0000 mg | ORAL_TABLET | Freq: Every day | ORAL | 2 refills | Status: AC
Start: 2017-05-03 — End: ?

## 2017-05-14 DIAGNOSIS — E7800 Pure hypercholesterolemia, unspecified: Secondary | ICD-10-CM | POA: Diagnosis not present

## 2017-05-14 DIAGNOSIS — E119 Type 2 diabetes mellitus without complications: Secondary | ICD-10-CM | POA: Diagnosis not present

## 2017-05-14 DIAGNOSIS — M1712 Unilateral primary osteoarthritis, left knee: Secondary | ICD-10-CM | POA: Diagnosis not present

## 2017-05-14 DIAGNOSIS — I10 Essential (primary) hypertension: Secondary | ICD-10-CM | POA: Diagnosis not present

## 2017-06-01 DIAGNOSIS — M1712 Unilateral primary osteoarthritis, left knee: Secondary | ICD-10-CM | POA: Diagnosis not present

## 2017-06-01 DIAGNOSIS — I10 Essential (primary) hypertension: Secondary | ICD-10-CM | POA: Diagnosis not present

## 2017-06-01 DIAGNOSIS — E7800 Pure hypercholesterolemia, unspecified: Secondary | ICD-10-CM | POA: Diagnosis not present

## 2017-06-01 DIAGNOSIS — E119 Type 2 diabetes mellitus without complications: Secondary | ICD-10-CM | POA: Diagnosis not present

## 2017-06-08 DIAGNOSIS — M1712 Unilateral primary osteoarthritis, left knee: Secondary | ICD-10-CM | POA: Diagnosis not present

## 2017-06-15 DIAGNOSIS — M1712 Unilateral primary osteoarthritis, left knee: Secondary | ICD-10-CM | POA: Diagnosis not present

## 2017-07-14 DEATH — deceased

## 2017-09-12 DIAGNOSIS — I10 Essential (primary) hypertension: Secondary | ICD-10-CM | POA: Diagnosis not present

## 2017-09-12 DIAGNOSIS — E119 Type 2 diabetes mellitus without complications: Secondary | ICD-10-CM | POA: Diagnosis not present

## 2017-09-17 DIAGNOSIS — E119 Type 2 diabetes mellitus without complications: Secondary | ICD-10-CM | POA: Diagnosis not present

## 2017-09-17 DIAGNOSIS — K219 Gastro-esophageal reflux disease without esophagitis: Secondary | ICD-10-CM | POA: Diagnosis not present

## 2017-09-17 DIAGNOSIS — K7689 Other specified diseases of liver: Secondary | ICD-10-CM | POA: Diagnosis not present

## 2017-09-17 DIAGNOSIS — M542 Cervicalgia: Secondary | ICD-10-CM | POA: Diagnosis not present

## 2017-09-17 DIAGNOSIS — Z79899 Other long term (current) drug therapy: Secondary | ICD-10-CM | POA: Diagnosis not present

## 2017-09-17 DIAGNOSIS — M4802 Spinal stenosis, cervical region: Secondary | ICD-10-CM | POA: Diagnosis not present

## 2017-09-20 DIAGNOSIS — Z0389 Encounter for observation for other suspected diseases and conditions ruled out: Secondary | ICD-10-CM | POA: Diagnosis not present

## 2017-09-20 DIAGNOSIS — R748 Abnormal levels of other serum enzymes: Secondary | ICD-10-CM | POA: Diagnosis not present

## 2017-09-21 DIAGNOSIS — N3281 Overactive bladder: Secondary | ICD-10-CM | POA: Diagnosis not present

## 2017-09-21 DIAGNOSIS — R300 Dysuria: Secondary | ICD-10-CM | POA: Diagnosis not present

## 2017-09-21 DIAGNOSIS — R339 Retention of urine, unspecified: Secondary | ICD-10-CM | POA: Diagnosis not present

## 2017-10-05 DIAGNOSIS — N3592 Unspecified urethral stricture, female: Secondary | ICD-10-CM | POA: Diagnosis not present

## 2017-10-26 DIAGNOSIS — N3281 Overactive bladder: Secondary | ICD-10-CM | POA: Diagnosis not present

## 2017-11-23 DIAGNOSIS — R0609 Other forms of dyspnea: Secondary | ICD-10-CM | POA: Diagnosis not present

## 2017-11-23 DIAGNOSIS — I10 Essential (primary) hypertension: Secondary | ICD-10-CM | POA: Diagnosis not present

## 2017-11-23 DIAGNOSIS — E1165 Type 2 diabetes mellitus with hyperglycemia: Secondary | ICD-10-CM | POA: Diagnosis not present

## 2017-11-23 DIAGNOSIS — R259 Unspecified abnormal involuntary movements: Secondary | ICD-10-CM | POA: Diagnosis not present

## 2017-11-27 DIAGNOSIS — E119 Type 2 diabetes mellitus without complications: Secondary | ICD-10-CM | POA: Diagnosis not present

## 2017-11-27 DIAGNOSIS — Z79899 Other long term (current) drug therapy: Secondary | ICD-10-CM | POA: Diagnosis not present

## 2017-11-27 DIAGNOSIS — Z5181 Encounter for therapeutic drug level monitoring: Secondary | ICD-10-CM | POA: Diagnosis not present

## 2017-11-27 DIAGNOSIS — K7689 Other specified diseases of liver: Secondary | ICD-10-CM | POA: Diagnosis not present

## 2017-12-03 DIAGNOSIS — I10 Essential (primary) hypertension: Secondary | ICD-10-CM | POA: Diagnosis not present

## 2017-12-03 DIAGNOSIS — Z79899 Other long term (current) drug therapy: Secondary | ICD-10-CM | POA: Diagnosis not present

## 2017-12-03 DIAGNOSIS — K219 Gastro-esophageal reflux disease without esophagitis: Secondary | ICD-10-CM | POA: Diagnosis not present

## 2017-12-03 DIAGNOSIS — K7689 Other specified diseases of liver: Secondary | ICD-10-CM | POA: Diagnosis not present

## 2017-12-03 DIAGNOSIS — M542 Cervicalgia: Secondary | ICD-10-CM | POA: Diagnosis not present

## 2017-12-03 DIAGNOSIS — E119 Type 2 diabetes mellitus without complications: Secondary | ICD-10-CM | POA: Diagnosis not present

## 2017-12-03 DIAGNOSIS — E7800 Pure hypercholesterolemia, unspecified: Secondary | ICD-10-CM | POA: Diagnosis not present

## 2017-12-27 DIAGNOSIS — J0110 Acute frontal sinusitis, unspecified: Secondary | ICD-10-CM | POA: Diagnosis not present

## 2017-12-27 DIAGNOSIS — E7800 Pure hypercholesterolemia, unspecified: Secondary | ICD-10-CM | POA: Diagnosis not present

## 2017-12-27 DIAGNOSIS — R05 Cough: Secondary | ICD-10-CM | POA: Diagnosis not present

## 2017-12-27 DIAGNOSIS — E119 Type 2 diabetes mellitus without complications: Secondary | ICD-10-CM | POA: Diagnosis not present

## 2017-12-28 ENCOUNTER — Encounter: Payer: Self-pay | Admitting: Podiatry

## 2017-12-28 ENCOUNTER — Ambulatory Visit: Payer: Medicare Other | Admitting: Podiatry

## 2017-12-28 DIAGNOSIS — M79609 Pain in unspecified limb: Secondary | ICD-10-CM | POA: Diagnosis not present

## 2017-12-28 DIAGNOSIS — E119 Type 2 diabetes mellitus without complications: Secondary | ICD-10-CM | POA: Diagnosis not present

## 2017-12-28 DIAGNOSIS — M2041 Other hammer toe(s) (acquired), right foot: Secondary | ICD-10-CM

## 2017-12-28 DIAGNOSIS — B351 Tinea unguium: Secondary | ICD-10-CM | POA: Diagnosis not present

## 2017-12-28 NOTE — Progress Notes (Signed)
This patient presents the office today to discuss her painful second toe right foot.  She says that her second toe is lifted up and rubs against her shoes.  She says this becomes painful walking and wearing her shoes.  She also has long thick, deformed toenails on both feet. Patient is diabetic on metformin.  She presents the office today for an evaluation and treatment of both feet.  General Appearance  Alert, conversant and in no acute stress.  Vascular  Dorsalis pedis   pulses are palpable  bilaterally. Posterior tibial pulses are not palpable  B/L. Capillary return is within normal limits  bilaterally. Temperature is within normal limits  bilaterally.  Neurologic  Senn-Weinstein monofilament wire test within normal limits  bilaterally. Muscle power within normal limits bilaterally.  Nails Thick disfigured discolored nails with subungual debris  from hallux to fifth toes bilaterally. No evidence of bacterial infection or drainage bilaterally.  Orthopedic  No limitations of motion of motion feet .  No crepitus or effusions noted. Flexor plate injury second toe right foot leading to hammer toe second right.  Skin  normotropic skin with no porokeratosis noted bilaterally.  No signs of infections or ulcers noted.     Onychomycosis  B/L  Hammer toe right.  Debride nails  X 10.  Explained the etiology  of her hammertoe.  Discussed conservative versus surgical treatment of this hammertoe.  Padding dispensed to this patient.  RTC prn.   Helane Gunther DPM

## 2018-01-01 DIAGNOSIS — M65321 Trigger finger, right index finger: Secondary | ICD-10-CM | POA: Diagnosis not present

## 2018-01-24 DIAGNOSIS — E1165 Type 2 diabetes mellitus with hyperglycemia: Secondary | ICD-10-CM | POA: Diagnosis not present

## 2018-01-24 DIAGNOSIS — Z794 Long term (current) use of insulin: Secondary | ICD-10-CM | POA: Diagnosis not present

## 2018-01-24 DIAGNOSIS — R259 Unspecified abnormal involuntary movements: Secondary | ICD-10-CM | POA: Diagnosis not present

## 2018-01-24 DIAGNOSIS — I10 Essential (primary) hypertension: Secondary | ICD-10-CM | POA: Diagnosis not present

## 2018-03-05 DIAGNOSIS — R6889 Other general symptoms and signs: Secondary | ICD-10-CM | POA: Diagnosis not present

## 2018-03-05 DIAGNOSIS — I10 Essential (primary) hypertension: Secondary | ICD-10-CM | POA: Diagnosis not present

## 2018-03-05 DIAGNOSIS — Z79899 Other long term (current) drug therapy: Secondary | ICD-10-CM | POA: Diagnosis not present

## 2018-03-05 DIAGNOSIS — E119 Type 2 diabetes mellitus without complications: Secondary | ICD-10-CM | POA: Diagnosis not present

## 2018-03-05 DIAGNOSIS — Z5181 Encounter for therapeutic drug level monitoring: Secondary | ICD-10-CM | POA: Diagnosis not present

## 2018-03-08 DIAGNOSIS — R6889 Other general symptoms and signs: Secondary | ICD-10-CM | POA: Diagnosis not present

## 2018-03-08 DIAGNOSIS — E7800 Pure hypercholesterolemia, unspecified: Secondary | ICD-10-CM | POA: Diagnosis not present

## 2018-03-08 DIAGNOSIS — E119 Type 2 diabetes mellitus without complications: Secondary | ICD-10-CM | POA: Diagnosis not present

## 2018-03-08 DIAGNOSIS — I10 Essential (primary) hypertension: Secondary | ICD-10-CM | POA: Diagnosis not present

## 2018-04-30 DIAGNOSIS — H524 Presbyopia: Secondary | ICD-10-CM | POA: Diagnosis not present

## 2018-04-30 DIAGNOSIS — E119 Type 2 diabetes mellitus without complications: Secondary | ICD-10-CM | POA: Diagnosis not present

## 2018-04-30 DIAGNOSIS — H35363 Drusen (degenerative) of macula, bilateral: Secondary | ICD-10-CM | POA: Diagnosis not present

## 2018-06-04 ENCOUNTER — Encounter: Payer: Self-pay | Admitting: Podiatry

## 2018-06-04 ENCOUNTER — Other Ambulatory Visit: Payer: Self-pay

## 2018-06-04 ENCOUNTER — Ambulatory Visit: Payer: Medicare Other | Admitting: Podiatry

## 2018-06-04 DIAGNOSIS — M79674 Pain in right toe(s): Secondary | ICD-10-CM

## 2018-06-04 DIAGNOSIS — E1142 Type 2 diabetes mellitus with diabetic polyneuropathy: Secondary | ICD-10-CM

## 2018-06-04 DIAGNOSIS — M79675 Pain in left toe(s): Secondary | ICD-10-CM | POA: Diagnosis not present

## 2018-06-04 DIAGNOSIS — B351 Tinea unguium: Secondary | ICD-10-CM | POA: Diagnosis not present

## 2018-06-14 DIAGNOSIS — J0100 Acute maxillary sinusitis, unspecified: Secondary | ICD-10-CM | POA: Diagnosis not present

## 2018-06-16 ENCOUNTER — Encounter: Payer: Self-pay | Admitting: Podiatry

## 2018-06-16 NOTE — Progress Notes (Signed)
Subjective: Virginia Gilbert presents today for follow up routine diabetic foot care. She denies any trauma to feet sinc her last visit. Chief complaint today is concern of right 1st and 2nd digits rubbing together causing discomfort. Duration is about 1 month. She denies any redness, drainage or swelling of either digit. She also states she gets a cold sensation in her feet at night.   Objective: Vascular Examination: Capillary refill time <3 seconds x 10 digits Dorsalis pedis are present b/l Posterior tibial pulses absent b/l No digital hair x 10 digits Skin temperature gradient WNL b/l No pedal edema b/l No signs of ischemia noted b/l  Dermatological Examination: Skin with normal turgor, texture and tone b/l No open wounds, no hyperkeratoses noted  Toenails 1-5 b/l discolored, thick, dystrophic with subungual debris and pain with palpation to nailbeds due to thickness of nails.  Musculoskeletal: Hammertoe defomity right 2nd digit Muscle strength 5/5 to all LE muscle groups  Neurological: Sensation intact with 10 gram monofilament. Vibratory sensation intact. +paresthesias  Assessment: Painful onychomycosis toenails 1-5 b/l  Early neuropathy symptoms b/l  Plan: 1. Discussed diabetic neuropathy symptoms and medications available for it. She will discuss with her primary physician.  2. Dispensed adequate size toe separator for right hallux and right 2nd toe. She is to apply every morning before her socks/stockings and remove every evening. It is rewashable. Patient related understanding. 3. Toenails 1-5 b/l were debrided in length and girth without iatrogenic bleeding. 4. Patient to continue soft, supportive shoe gear 5. Patient to report any pedal injuries to medical professional immediately. 6. Follow up 3 months. Patient/POA to call should there be a concern in the interim.

## 2018-07-02 DIAGNOSIS — E119 Type 2 diabetes mellitus without complications: Secondary | ICD-10-CM | POA: Diagnosis not present

## 2018-07-02 DIAGNOSIS — I10 Essential (primary) hypertension: Secondary | ICD-10-CM | POA: Diagnosis not present

## 2018-07-09 DIAGNOSIS — E119 Type 2 diabetes mellitus without complications: Secondary | ICD-10-CM | POA: Diagnosis not present

## 2018-07-09 DIAGNOSIS — E7800 Pure hypercholesterolemia, unspecified: Secondary | ICD-10-CM | POA: Diagnosis not present

## 2018-07-09 DIAGNOSIS — R05 Cough: Secondary | ICD-10-CM | POA: Diagnosis not present

## 2018-07-09 DIAGNOSIS — I10 Essential (primary) hypertension: Secondary | ICD-10-CM | POA: Diagnosis not present

## 2018-07-09 DIAGNOSIS — K219 Gastro-esophageal reflux disease without esophagitis: Secondary | ICD-10-CM | POA: Diagnosis not present

## 2018-07-26 DIAGNOSIS — E119 Type 2 diabetes mellitus without complications: Secondary | ICD-10-CM | POA: Diagnosis not present

## 2018-07-26 DIAGNOSIS — I10 Essential (primary) hypertension: Secondary | ICD-10-CM | POA: Diagnosis not present

## 2018-07-26 DIAGNOSIS — R05 Cough: Secondary | ICD-10-CM | POA: Diagnosis not present

## 2018-07-26 DIAGNOSIS — L209 Atopic dermatitis, unspecified: Secondary | ICD-10-CM | POA: Diagnosis not present

## 2018-07-26 DIAGNOSIS — K219 Gastro-esophageal reflux disease without esophagitis: Secondary | ICD-10-CM | POA: Diagnosis not present

## 2018-08-28 DIAGNOSIS — M1712 Unilateral primary osteoarthritis, left knee: Secondary | ICD-10-CM | POA: Diagnosis not present

## 2018-09-03 ENCOUNTER — Ambulatory Visit: Payer: Medicare Other | Admitting: Podiatry

## 2018-10-18 DIAGNOSIS — E7800 Pure hypercholesterolemia, unspecified: Secondary | ICD-10-CM | POA: Diagnosis not present

## 2018-10-18 DIAGNOSIS — E119 Type 2 diabetes mellitus without complications: Secondary | ICD-10-CM | POA: Diagnosis not present

## 2018-10-18 DIAGNOSIS — I10 Essential (primary) hypertension: Secondary | ICD-10-CM | POA: Diagnosis not present

## 2018-10-25 DIAGNOSIS — E118 Type 2 diabetes mellitus with unspecified complications: Secondary | ICD-10-CM | POA: Diagnosis not present

## 2018-10-25 DIAGNOSIS — E7800 Pure hypercholesterolemia, unspecified: Secondary | ICD-10-CM | POA: Diagnosis not present

## 2018-10-25 DIAGNOSIS — I10 Essential (primary) hypertension: Secondary | ICD-10-CM | POA: Diagnosis not present

## 2018-10-25 DIAGNOSIS — Z23 Encounter for immunization: Secondary | ICD-10-CM | POA: Diagnosis not present

## 2018-10-25 DIAGNOSIS — Z0000 Encounter for general adult medical examination without abnormal findings: Secondary | ICD-10-CM | POA: Diagnosis not present

## 2018-10-25 DIAGNOSIS — K219 Gastro-esophageal reflux disease without esophagitis: Secondary | ICD-10-CM | POA: Diagnosis not present

## 2018-10-28 DIAGNOSIS — M8589 Other specified disorders of bone density and structure, multiple sites: Secondary | ICD-10-CM | POA: Diagnosis not present

## 2019-01-14 DIAGNOSIS — H903 Sensorineural hearing loss, bilateral: Secondary | ICD-10-CM | POA: Diagnosis not present

## 2019-01-14 DIAGNOSIS — H9313 Tinnitus, bilateral: Secondary | ICD-10-CM | POA: Diagnosis not present

## 2019-01-20 ENCOUNTER — Telehealth: Payer: Self-pay | Admitting: *Deleted

## 2019-01-20 NOTE — Telephone Encounter (Signed)
Called patient and advised her Due to current COVID 19 pandemic, our office is severely reducing in person visits in order to minimize the risk to our patients and healthcare providers. We recommend to convert your appointment to a video visit.  She stated she's unable to do video visit and needs to reschedule. We rescheduled for July. I explained in office visit procedures. She  verbalized understanding, appreciation.

## 2019-01-21 ENCOUNTER — Ambulatory Visit: Payer: Medicare Other | Admitting: Diagnostic Neuroimaging

## 2019-02-17 DIAGNOSIS — E7800 Pure hypercholesterolemia, unspecified: Secondary | ICD-10-CM | POA: Diagnosis not present

## 2019-02-17 DIAGNOSIS — I10 Essential (primary) hypertension: Secondary | ICD-10-CM | POA: Diagnosis not present

## 2019-02-17 DIAGNOSIS — E118 Type 2 diabetes mellitus with unspecified complications: Secondary | ICD-10-CM | POA: Diagnosis not present

## 2019-02-19 ENCOUNTER — Ambulatory Visit: Payer: Self-pay | Admitting: Diagnostic Neuroimaging

## 2019-02-24 DIAGNOSIS — K219 Gastro-esophageal reflux disease without esophagitis: Secondary | ICD-10-CM | POA: Diagnosis not present

## 2019-02-24 DIAGNOSIS — E118 Type 2 diabetes mellitus with unspecified complications: Secondary | ICD-10-CM | POA: Diagnosis not present

## 2019-02-24 DIAGNOSIS — E7800 Pure hypercholesterolemia, unspecified: Secondary | ICD-10-CM | POA: Diagnosis not present

## 2019-02-24 DIAGNOSIS — I10 Essential (primary) hypertension: Secondary | ICD-10-CM | POA: Diagnosis not present

## 2019-03-31 DIAGNOSIS — R0609 Other forms of dyspnea: Secondary | ICD-10-CM | POA: Diagnosis not present

## 2019-03-31 DIAGNOSIS — R9389 Abnormal findings on diagnostic imaging of other specified body structures: Secondary | ICD-10-CM | POA: Diagnosis not present

## 2019-03-31 DIAGNOSIS — R0600 Dyspnea, unspecified: Secondary | ICD-10-CM | POA: Diagnosis not present

## 2019-04-28 DIAGNOSIS — M545 Low back pain: Secondary | ICD-10-CM | POA: Diagnosis not present

## 2019-04-28 DIAGNOSIS — Z23 Encounter for immunization: Secondary | ICD-10-CM | POA: Diagnosis not present

## 2019-05-15 ENCOUNTER — Other Ambulatory Visit: Payer: Self-pay

## 2019-05-15 ENCOUNTER — Encounter: Payer: Self-pay | Admitting: Cardiology

## 2019-05-15 ENCOUNTER — Ambulatory Visit: Payer: Medicare Other | Admitting: Cardiology

## 2019-05-15 VITALS — BP 130/69 | HR 85 | Temp 97.3°F | Ht 59.0 in | Wt 165.3 lb

## 2019-05-15 DIAGNOSIS — I4891 Unspecified atrial fibrillation: Secondary | ICD-10-CM

## 2019-05-15 DIAGNOSIS — I10 Essential (primary) hypertension: Secondary | ICD-10-CM

## 2019-05-15 DIAGNOSIS — R0602 Shortness of breath: Secondary | ICD-10-CM | POA: Diagnosis not present

## 2019-05-15 DIAGNOSIS — E782 Mixed hyperlipidemia: Secondary | ICD-10-CM

## 2019-05-15 MED ORDER — APIXABAN 5 MG PO TABS: 5.0000 mg | ORAL_TABLET | Freq: Two times a day (BID) | ORAL | 3 refills | Status: DC

## 2019-05-15 NOTE — Patient Instructions (Signed)
Atrial Fibrillation ° °Atrial fibrillation is a type of heartbeat that is irregular or fast (rapid). If you have this condition, your heart beats without any order. This makes it hard for your heart to pump blood in a normal way. Having this condition gives you more risk for stroke, heart failure, and other heart problems. °Atrial fibrillation may start all of a sudden and then stop on its own, or it may become a long-lasting problem. °What are the causes? °This condition may be caused by heart conditions, such as: °· High blood pressure. °· Heart failure. °· Heart valve disease. °· Heart surgery. °Other causes include: °· Pneumonia. °· Obstructive sleep apnea. °· Lung cancer. °· Thyroid disease. °· Drinking too much alcohol. °Sometimes the cause is not known. °What increases the risk? °You are more likely to develop this condition if: °· You smoke. °· You are older. °· You have diabetes. °· You are overweight. °· You have a family history of this condition. °· You exercise often and hard. °What are the signs or symptoms? °Common symptoms of this condition include: °· A feeling like your heart is beating very fast. °· Chest pain. °· Feeling short of breath. °· Feeling light-headed or weak. °· Getting tired easily. °Follow these instructions at home: °Medicines °· Take over-the-counter and prescription medicines only as told by your doctor. °· If your doctor gives you a blood-thinning medicine, take it exactly as told. Taking too much of it can cause bleeding. Taking too little of it does not protect you against clots. Clots can cause a stroke. °Lifestyle ° °  ° °· Do not use any tobacco products. These include cigarettes, chewing tobacco, and e-cigarettes. If you need help quitting, ask your doctor. °· Do not drink alcohol. °· Do not drink beverages that have caffeine. These include coffee, soda, and tea. °· Follow diet instructions as told by your doctor. °· Exercise regularly as told by your doctor. °General  instructions °· If you have a condition that causes breathing to stop for a short period of time (apnea), treat it as told by your doctor. °· Keep a healthy weight. Do not use diet pills unless your doctor says they are safe for you. Diet pills may make heart problems worse. °· Keep all follow-up visits as told by your doctor. This is important. °Contact a doctor if: °· You notice a change in the speed, rhythm, or strength of your heartbeat. °· You are taking a blood-thinning medicine and you see more bruising. °· You get tired more easily when you move or exercise. °· You have a sudden change in weight. °Get help right away if: ° °· You have pain in your chest or your belly (abdomen). °· You have trouble breathing. °· You have blood in your vomit, poop, or pee (urine). °· You have any signs of a stroke. "BE FAST" is an easy way to remember the main warning signs: °? B - Balance. Signs are dizziness, sudden trouble walking, or loss of balance. °? E - Eyes. Signs are trouble seeing or a change in how you see. °? F - Face. Signs are sudden weakness or loss of feeling in the face, or the face or eyelid drooping on one side. °? A - Arms. Signs are weakness or loss of feeling in an arm. This happens suddenly and usually on one side of the body. °? S - Speech. Signs are sudden trouble speaking, slurred speech, or trouble understanding what people say. °? T - Time.   Time to call emergency services. Write down what time symptoms started. °· You have other signs of a stroke, such as: °? A sudden, very bad headache with no known cause. °? Feeling sick to your stomach (nausea). °? Throwing up (vomiting). °? Jerky movements you cannot control (seizure). °These symptoms may be an emergency. Do not wait to see if the symptoms will go away. Get medical help right away. Call your local emergency services (911 in the [place] Do not drive yourself to the hospital. °Summary °· Atrial fibrillation is a type of heartbeat that is irregular  or fast (rapid). °· You are at higher risk of this condition if you smoke, are older, have diabetes, or are overweight. °· Follow your doctor's instructions about medicines, diet, exercise, and follow-up visits. °· Get help right away if you think that you have signs of a stroke. °This information is not intended to replace advice given to you by your health care provider. Make sure you discuss any questions you have with your health care provider. °Document Released: 05/09/2008 Document Revised: 10/04/2017 Document Reviewed: 09/21/2017 °Elsevier Patient Education © 2020 [employer] ° °

## 2019-05-15 NOTE — Progress Notes (Signed)
Primary Physician/Referring:  Jani Gravel, MD  Patient ID: Virginia Gilbert, female    DOB: 20-Nov-1940, 78 y.o.   MRN: [id number]  Chief Complaint  Patient presents with  . Shortness of Breath    New worsening dyspnea over 2 months  . Leg Swelling   HPI:    Virginia Gilbert  is a 78 y.o. Caucasian female with uncontrolled type II diabetes mellitus, hyperlipidemia, hypertension.   Patient was seen by me about a year ago for mild chronic dyspnea and hypertension management, as she was stable and medial visits p.r.n.  She called our office to be seen as months ago she started noticing marked worsening dyspnea and leg edema.  Doing routine chores brings on marked dyspnea that she used easily do before.  No associated chest pain.  No palpitations, dizziness or syncope.  States that diabetes is still pending down, blood pressure has been well-controlled.  Past Medical History:  Diagnosis Date  . Diabetes mellitus without complication ([place])   . Hyperlipidemia    Past Surgical History:  Procedure Laterality Date  . PARTIAL HYSTERECTOMY     1977   Social History   Socioeconomic History  . Marital status: Widowed    Spouse name: Not on file  . Number of children: 3  . Years of education: Not on file  . Highest education level: Not on file  Occupational History  . Not on file  Social Needs  . Financial resource strain: Not on file  . Food insecurity    Worry: Not on file    Inability: Not on file  . Transportation needs    Medical: Not on file    Non-medical: Not on file  Tobacco Use  . Smoking status: Former Smoker    Types: Cigarettes    Quit date: 05/14/1985    Years since quitting: 34.0  . Smokeless tobacco: Never Used  Substance and Sexual Activity  . Alcohol use: No    Alcohol/week: 0.0 standard drinks  . Drug use: Not on file  . Sexual activity: Not on file  Lifestyle  . Physical activity    Days per week: Not on file    Minutes per session: Not on file  .  Stress: Not on file  Relationships  . Social Herbalist on phone: Not on file    Gets together: Not on file    Attends religious service: Not on file    Active member of club or organization: Not on file    Attends meetings of clubs or organizations: Not on file    Relationship status: Not on file  . Intimate partner violence    Fear of current or ex partner: Not on file    Emotionally abused: Not on file    Physically abused: Not on file    Forced sexual activity: Not on file  Other Topics Concern  . Not on file  Social History Narrative  . Not on file   ROS  Review of Systems  Constitution: Negative for chills, decreased appetite, malaise/fatigue and weight gain.  Cardiovascular: Positive for dyspnea on exertion and leg swelling. Negative for orthopnea and syncope.  Endocrine: Negative for cold intolerance.  Hematologic/Lymphatic: Does not bruise/bleed easily.  Musculoskeletal: Positive for back pain, joint pain (bilateral knee and also hips) and muscle cramps (night cramps). Negative for joint swelling.  Gastrointestinal: Positive for heartburn. Negative for abdominal pain, anorexia, change in bowel habit, hematochezia and melena.  Neurological: Positive for  tremors. Negative for headaches and light-headedness.  Psychiatric/Behavioral: Negative for depression and substance abuse.  All other systems reviewed and are negative.  Objective   Vitals with BMI 05/15/2019 12/02/2016 05/10/2016  Height _0  - -  Weight 165 lbs 5 oz - -  BMI 16.10 - -  Systolic 960 454 098  Diastolic 69 81 78  Pulse 85 84 87    Blood pressure 130/69, pulse 85, temperature (!) 97.3 F (36.3 C), height _1  (1.499 m), weight 165 lb 4.8 oz (75 kg), SpO2 97 %. Body mass index is 33.39 kg/m.   Physical Exam  Constitutional: She appears well-developed and well-nourished. No distress.  Short stature and mildly obese in no acute distress.  HENT:  Head: Atraumatic.  Eyes: Conjunctivae are  normal.  Neck: Neck supple. No JVD present. No thyromegaly present.  Cardiovascular: Normal rate, normal heart sounds and intact distal pulses. An irregularly irregular rhythm present. Exam reveals no gallop, no S3 and no S4.  No murmur heard. Pulses:      Carotid pulses are 2+ on the right side and 2+ on the left side.      Radial pulses are 2+ on the right side and 2+ on the left side.       Femoral pulses are 2+ on the right side and 2+ on the left side.      Popliteal pulses are 2+ on the right side and 1+ on the left side.       Dorsalis pedis pulses are 2+ on the right side and 1+ on the left side.       Posterior tibial pulses are 2+ on the right side and 1+ on the left side.  S1 is variable, S2 is normal. 1-2+ bilateral ankle edema. No JVD.    Pulmonary/Chest: Effort normal and breath sounds normal.  Abdominal: Soft. Bowel sounds are normal.  Obese abdomen  Musculoskeletal: Normal range of motion.        General: No edema.  Neurological: She is alert.  Skin: Skin is warm and dry.  Psychiatric: She has a normal mood and affect.   Radiology: No results found.  Laboratory examination:   Labs 11/17/2017: Glucose 213.  BUN/creatinine 14/0.66.  EGFR 97.  Sodium 140, potassium 4.4 H/H 13.9/43.1.  MCV 89.6.  Platelets 260. Hemoglobin A1c 8.4% Cholesterol 128, HDL 35, LDL 70, triglycerides 113  No results for input(s): NA, K, CL, CO2, GLUCOSE, BUN, CREATININE, CALCIUM, GFRNONAA, GFRAA in the last 8760 hours. No flowsheet data found. No flowsheet data found. Lipid Panel  No results found for: CHOL, TRIG, HDL, CHOLHDL, VLDL, LDLCALC, LDLDIRECT HEMOGLOBIN A1C No results found for: HGBA1C, MPG TSH No results for input(s): TSH in the last 8760 hours. Medications   Allergies  Allergen Reactions  . Codeine Nausea And Vomiting  . Penicillins Itching  . Tramadol Itching     Prior to Admission medications   Medication Sig Start Date End Date Taking? Authorizing Provider   amLODipine (NORVASC) 5 MG tablet Take 5 mg by mouth daily. 05/10/18  Yes [provider]  Apoaequorin (PREVAGEN PO) Take 1 capsule by mouth daily at 2 PM.   Yes [provider]  aspirin 325 MG tablet Take 325 mg by mouth daily.   Yes [provider]  carvedilol (COREG) 3.125 MG tablet Take 3.125 mg by mouth daily at 2 PM.  04/18/18  Yes [provider]  ezetimibe (ZETIA) 10 MG tablet Take 1 tablet by mouth daily at  2 PM. 02/24/19  Yes [provider]  glimepiride (AMARYL) 4 MG tablet TAKE 1 TABLET BY MOUTH TWICE DAILY WITH BREAKFAST OR THE FIRST MAIN MEAL OF THE DAY 05/08/18  Yes [provider]  hydrochlorothiazide (MICROZIDE) 12.5 MG capsule Take 12.5 mg by mouth daily. 12/14/14  Yes [provider]  losartan (COZAAR) 100 MG tablet TAKE ONE TABLET BY MOUTH EVERY DAY 30 03/06/15  Yes [provider]  metFORMIN (GLUCOPHAGE) 1000 MG tablet TAKE ONE TABLET BY MOUTH TWICE A DAY ORALLY 02/26/15  Yes [provider]  TRULICITY 1.5 LZ/7.6BH SOPN  05/29/18  Yes [provider]     Current Outpatient Medications  Medication Instructions  . amLODipine (NORVASC) 5 mg, Oral, Daily  . apixaban (ELIQUIS) 5 mg, Oral, 2 times daily  . Apoaequorin (PREVAGEN PO) 1 capsule, Oral, Daily  . carvedilol (COREG) 3.125 mg, Oral, Daily  . ezetimibe (ZETIA) 10 MG tablet 1 tablet, Oral, Daily  . glimepiride (AMARYL) 4 MG tablet TAKE 1 TABLET BY MOUTH TWICE DAILY WITH BREAKFAST OR THE FIRST MAIN MEAL OF THE DAY  . hydrochlorothiazide (MICROZIDE) 12.5 mg, Oral, Daily  . losartan (COZAAR) 100 MG tablet TAKE ONE TABLET BY MOUTH EVERY DAY 30  . metFORMIN (GLUCOPHAGE) 1000 MG tablet TAKE ONE TABLET BY MOUTH TWICE A DAY ORALLY  . TRULICITY 1.5 AL/9.3XT SOPN No dose, route, or frequency recorded.    Cardiac Studies:   Echo 2.23.12 Normal LVEF, mils Left atrial enlargement   Nuclear stress test [03/19/2015]:  1. The resting  electrocardiogram demonstrated normal sinus rhythm and normal resting conduction. Poor R wave progression. Stress EKG is non diagnostic for ischemia as it is a pharmacologic stress using Lexiscan. Stress symptoms included dyspnea. 2. The perfusion imaging study demonstrates soft tissue attenuation artifact in the inferior wall. There is no demonstrable ischemia or scar. LV systolic function was normal at 67% This is a low risk study  Assessment     ICD-10-CM   1. Shortness of breath  R06.02 EKG 12-Lead    PCV ECHOCARDIOGRAM COMPLETE    PCV MYOCARDIAL PERFUSION WITH LEXISCAN  2. Atrial fibrillation, unspecified type (HCC)  I48.91 PCV ECHOCARDIOGRAM COMPLETE    PCV MYOCARDIAL PERFUSION WITH LEXISCAN    apixaban (ELIQUIS) 5 MG TABS tablet   CHA2DS2-VASc Score is 5.  Yearly risk of stroke: 6.7%. (Age, Female, HTN and DM).    3. Essential hypertension  I10   4. Mixed hyperlipidemia  E78.2    Discontinue aspirin as restarting Eliquis.  EKG 05/15/2019: Atrial fibrillation with controlled ventricular response at the rate of 88 bpm, cannot exclude inferior infarct old.  Anteroseptal infarct old.  Nonspecific T abnormality.  Compared to 11/23/2017, atrial fibrillation is new and Q waves in aVF is new.  Recommendations:   Patient made an appointment to see me for worsening dyspnea on exertion over the past 2 months, worsening leg edema.  She is in atrial fibrillation new onset, also has EKG abnormalities suggestive of inferior infarct.  I'll discontinue aspirin and start her on Eliquis as her cardioembolic risk is extremely high.  She also probably has mild peripheral arterial disease as evidenced by abnormal physical exam home with regard cardioembolic risk is at least 5 if not 6.  Blood pressure is well controlled, lipids are being managed by PCP, in view of diabetes mellitus, multiple cardiac vascular risk factors and abnormal EKG, I'll schedule her for a Lexiscan Myoview stress test, patient unable  to exercise due to arthritis and  dyspnea.  We will also obtain an echocardiogram.  I'd like to see her back in 4 weeks for follow-up.  I'll discuss with her regarding cardioversion that may be necessary after the test.   Will obtain labs that were previously performed for my evalutation.   "Total time spent with patient was  40 minutes and greater than 50% of that time was spent in face to face discussion, counseling and coordination care"   Adrian Prows, MD, [facility] 05/15/2019, 10:24 AM [facility] [place] Pager: [phone] Office: [phone] If no answer Cell [phone]

## 2019-05-16 ENCOUNTER — Telehealth: Payer: Self-pay

## 2019-05-16 NOTE — Telephone Encounter (Signed)
Pt called to inform us that the Eliquis 5 mg that was given to her is too expensive, and will not be taking that medication. Please advice thank you

## 2019-05-19 DIAGNOSIS — S0501XA Injury of conjunctiva and corneal abrasion without foreign body, right eye, initial encounter: Secondary | ICD-10-CM | POA: Diagnosis not present

## 2019-05-19 NOTE — Telephone Encounter (Signed)
Her risk for stroke is extremely high, and she prefers microsuture to warfarin.  We could also try patient assistance.  Would not recommend stopping the medication.

## 2019-05-20 NOTE — Telephone Encounter (Signed)
Called pt to inform her about the message below. Pt understood and will come by someday to pick up the Pt ass.

## 2019-05-26 ENCOUNTER — Ambulatory Visit (INDEPENDENT_AMBULATORY_CARE_PROVIDER_SITE_OTHER): Payer: Medicare Other

## 2019-05-26 ENCOUNTER — Encounter: Payer: Self-pay | Admitting: Internal Medicine

## 2019-05-26 ENCOUNTER — Other Ambulatory Visit: Payer: Self-pay

## 2019-05-26 DIAGNOSIS — R0602 Shortness of breath: Secondary | ICD-10-CM | POA: Diagnosis not present

## 2019-05-26 DIAGNOSIS — I4891 Unspecified atrial fibrillation: Secondary | ICD-10-CM | POA: Diagnosis not present

## 2019-05-27 ENCOUNTER — Other Ambulatory Visit: Payer: Self-pay

## 2019-05-27 DIAGNOSIS — I4891 Unspecified atrial fibrillation: Secondary | ICD-10-CM

## 2019-05-27 MED ORDER — APIXABAN 5 MG PO TABS: 5.0000 mg | ORAL_TABLET | Freq: Two times a day (BID) | ORAL | 3 refills | Status: DC

## 2019-05-29 DIAGNOSIS — M5442 Lumbago with sciatica, left side: Secondary | ICD-10-CM | POA: Diagnosis not present

## 2019-05-29 NOTE — Progress Notes (Signed)
LVM for pt stating results

## 2019-06-09 ENCOUNTER — Other Ambulatory Visit: Payer: Self-pay

## 2019-06-09 ENCOUNTER — Ambulatory Visit (INDEPENDENT_AMBULATORY_CARE_PROVIDER_SITE_OTHER): Payer: Medicare Other

## 2019-06-09 DIAGNOSIS — I4891 Unspecified atrial fibrillation: Secondary | ICD-10-CM

## 2019-06-09 DIAGNOSIS — R0602 Shortness of breath: Secondary | ICD-10-CM

## 2019-06-10 NOTE — Progress Notes (Signed)
Called pt to inform her about he stress test

## 2019-06-17 ENCOUNTER — Encounter: Payer: Self-pay | Admitting: Cardiology

## 2019-06-18 ENCOUNTER — Encounter: Payer: Self-pay | Admitting: Cardiology

## 2019-06-18 ENCOUNTER — Ambulatory Visit: Payer: Medicare Other | Admitting: Cardiology

## 2019-06-18 ENCOUNTER — Other Ambulatory Visit: Payer: Self-pay

## 2019-06-18 VITALS — BP 155/88 | HR 89 | Ht 59.0 in | Wt 160.0 lb

## 2019-06-18 DIAGNOSIS — E782 Mixed hyperlipidemia: Secondary | ICD-10-CM | POA: Diagnosis not present

## 2019-06-18 DIAGNOSIS — I10 Essential (primary) hypertension: Secondary | ICD-10-CM

## 2019-06-18 DIAGNOSIS — I4819 Other persistent atrial fibrillation: Secondary | ICD-10-CM | POA: Diagnosis not present

## 2019-06-18 MED ORDER — ATORVASTATIN CALCIUM 10 MG PO TABS: 10.0000 mg | ORAL_TABLET | Freq: Every day | ORAL | 2 refills | Status: DC

## 2019-06-18 MED ORDER — APIXABAN 5 MG PO TABS: 5.0000 mg | ORAL_TABLET | Freq: Two times a day (BID) | ORAL | 3 refills | Status: DC

## 2019-06-18 NOTE — H&P (View-Only) (Signed)
Primary Physician/Referring:  Jani Gravel, MD  Patient ID: Virginia Gilbert, female    DOB: 06-12-1941, 78 y.o.   MRN: [id number]  Chief Complaint  Patient presents with  . Atrial Fibrillation  . Follow-up    4 week  . Results    echo   HPI:    Virginia Gilbert  is a 78 y.o. Caucasian female with uncontrolled type II diabetes mellitus, hyperlipidemia, hypertension, last seen 4 weeks ago with complaints of worsening dyspnea on exertion and leg edema, noted to be in new onset A. fib with EKG changes suggestive of inferior infarct.  In view of her cardioembolic risk being high, she was started on Eliquis.  She underwent Lexiscan nuclear stress testing and echocardiogram and now presents for follow-up.  She did not start Eliquis due to cost.  She is presently back on aspirin.  Except for fatigue that she has noticed recently, no other specific complaints, Chronic dyspnea is stable and denies chest pain.  Past Medical History:  Diagnosis Date  . Diabetes mellitus without complication ([place])   . Hyperlipidemia    Past Surgical History:  Procedure Laterality Date  . PARTIAL HYSTERECTOMY     1977   Social History   Socioeconomic History  . Marital status: Widowed    Spouse name: Not on file  . Number of children: 3  . Years of education: Not on file  . Highest education level: Not on file  Occupational History  . Not on file  Social Needs  . Financial resource strain: Not on file  . Food insecurity    Worry: Not on file    Inability: Not on file  . Transportation needs    Medical: Not on file    Non-medical: Not on file  Tobacco Use  . Smoking status: Former Smoker    Packs/day: 0.50    Years: 15.00    Pack years: 7.50    Types: Cigarettes    Quit date: 05/14/1985    Years since quitting: 34.1  . Smokeless tobacco: Never Used  Substance and Sexual Activity  . Alcohol use: No    Alcohol/week: 0.0 standard drinks  . Drug use: Not on file  . Sexual activity: Not on file   Lifestyle  . Physical activity    Days per week: Not on file    Minutes per session: Not on file  . Stress: Not on file  Relationships  . Social Herbalist on phone: Not on file    Gets together: Not on file    Attends religious service: Not on file    Active member of club or organization: Not on file    Attends meetings of clubs or organizations: Not on file    Relationship status: Not on file  . Intimate partner violence    Fear of current or ex partner: Not on file    Emotionally abused: Not on file    Physically abused: Not on file    Forced sexual activity: Not on file  Other Topics Concern  . Not on file  Social History Narrative  . Not on file   ROS  Review of Systems  Constitution: Positive for malaise/fatigue. Negative for chills, decreased appetite and weight gain.  Cardiovascular: Positive for dyspnea on exertion. Negative for leg swelling, orthopnea and syncope.  Endocrine: Negative for cold intolerance.  Hematologic/Lymphatic: Does not bruise/bleed easily.  Musculoskeletal: Positive for back pain, joint pain (bilateral knee and also hips) and  muscle cramps (night cramps). Negative for joint swelling.  Gastrointestinal: Positive for heartburn. Negative for abdominal pain, anorexia, change in bowel habit, hematochezia and melena.  Neurological: Positive for tremors. Negative for headaches and light-headedness.  Psychiatric/Behavioral: Negative for depression and substance abuse.  All other systems reviewed and are negative.  Objective   Vitals with BMI 06/18/2019 05/15/2019 12/02/2016  Height '4\' 11"'  '4\' 11"'  -  Weight 160 lbs 165 lbs 5 oz -  BMI 12.7 51.70 -  Systolic 017 494 496  Diastolic 88 69 81  Pulse 89 85 84    Blood pressure (!) 155/88, pulse 89, height '4\' 11"'  (1.499 m), weight 160 lb (72.6 kg), SpO2 96 %. Body mass index is 32.32 kg/m.   Physical Exam  Constitutional:  Short stature and mildly obese in no acute distress.  HENT:  Head:  Atraumatic.  Eyes: Conjunctivae are normal.  Neck: Neck supple. No JVD present. No thyromegaly present.  Cardiovascular: Normal rate, normal heart sounds and intact distal pulses. An irregularly irregular rhythm present. Exam reveals no gallop, no S3 and no S4.  No murmur heard. Pulses:      Carotid pulses are 2+ on the right side and 2+ on the left side.      Radial pulses are 2+ on the right side and 2+ on the left side.       Femoral pulses are 2+ on the right side and 2+ on the left side.      Popliteal pulses are 2+ on the right side and 1+ on the left side.       Dorsalis pedis pulses are 2+ on the right side and 1+ on the left side.       Posterior tibial pulses are 2+ on the right side and 1+ on the left side.  S1 is variable, S2 is normal. 1-2+ bilateral ankle edema. No JVD.    Pulmonary/Chest: Effort normal and breath sounds normal.  Abdominal: Soft. Bowel sounds are normal.  Obese abdomen  Musculoskeletal: Normal range of motion.        General: No edema.  Neurological: She is alert.  Skin: Skin is warm and dry.  Psychiatric: She has a normal mood and affect.   Radiology: No results found.  Laboratory examination:   Labs 04/28/2019: EGFR 82 mL, BUN 21, creatinine 0.81, potassium 4.9.  HB 4.6/HCT 40.6, platelets 09/15/59.  Serum cholesterol 141 mg, A1c 8.0%.  Total cholesterol 186, triglycerides 129, HDL 37, LDL 123.    Labs 11/17/2017: Glucose 213.  BUN/creatinine 14/0.66.  EGFR 97.  Sodium 140, potassium 4.4 H/H 13.9/43.1.  MCV 89.6.  Platelets 260. Hemoglobin A1c 8.4% Cholesterol 128, HDL 35, LDL 70, triglycerides 113  No results for input(s): NA, K, CL, CO2, GLUCOSE, BUN, CREATININE, CALCIUM, GFRNONAA, GFRAA in the last 8760 hours. No flowsheet data found. No flowsheet data found. Lipid Panel  No results found for: CHOL, TRIG, HDL, CHOLHDL, VLDL, LDLCALC, LDLDIRECT HEMOGLOBIN A1C No results found for: HGBA1C, MPG TSH No results for input(s): TSH in the last 8760  hours. Medications   Allergies  Allergen Reactions  . Codeine Nausea And Vomiting  . Penicillins Itching  . Tramadol Itching     Prior to Admission medications   Medication Sig Start Date End Date Taking? Authorizing Provider  amLODipine (NORVASC) 5 MG tablet Take 5 mg by mouth daily. 05/10/18  Yes [provider]  Apoaequorin (PREVAGEN PO) Take 1 capsule by mouth daily at 2 PM.   Yes [provider]  aspirin 325 MG tablet Take 325 mg by mouth daily.   Yes [provider]  carvedilol (COREG) 3.125 MG tablet Take 3.125 mg by mouth daily at 2 PM.  04/18/18  Yes [provider]  ezetimibe (ZETIA) 10 MG tablet Take 1 tablet by mouth daily at 2 PM. 02/24/19  Yes [provider]  glimepiride (AMARYL) 4 MG tablet TAKE 1 TABLET BY MOUTH TWICE DAILY WITH BREAKFAST OR THE FIRST MAIN MEAL OF THE DAY 05/08/18  Yes [provider]  hydrochlorothiazide (MICROZIDE) 12.5 MG capsule Take 12.5 mg by mouth daily. 12/14/14  Yes [provider]  losartan (COZAAR) 100 MG tablet TAKE ONE TABLET BY MOUTH EVERY DAY 30 03/06/15  Yes [provider]  metFORMIN (GLUCOPHAGE) 1000 MG tablet TAKE ONE TABLET BY MOUTH TWICE A DAY ORALLY 02/26/15  Yes [provider]  TRULICITY 1.5 WR/6.0AV SOPN  05/29/18  Yes [provider]     Current Outpatient Medications  Medication Instructions  . amLODipine (NORVASC) 5 mg, Oral, Daily  . apixaban (ELIQUIS) 5 mg, Oral, 2 times daily  . atorvastatin (LIPITOR) 10 mg, Oral, Daily  . carvedilol (COREG) 3.125 mg, Oral, Daily  . ezetimibe (ZETIA) 10 MG tablet 1 tablet, Oral, Daily  . glimepiride (AMARYL) 4 MG tablet TAKE 1 TABLET BY MOUTH TWICE DAILY WITH BREAKFAST OR THE FIRST MAIN MEAL OF THE DAY  . hydrochlorothiazide (MICROZIDE) 12.5 mg, Oral, Daily  . losartan (COZAAR) 100 MG tablet TAKE ONE TABLET BY MOUTH EVERY DAY 30  . metFORMIN (GLUCOPHAGE) 1000 MG tablet TAKE ONE TABLET BY MOUTH TWICE A DAY  ORALLY  . TRULICITY 1.5 WU/9.8JX SOPN Weekly    Cardiac Studies:   Lexiscan Myoview Stress Test 06/09/2019: Resting EKG atrial fibrillation, low voltage, nonspecific T abnormality.  Stress EKG no change.  Stress symptoms included neck pain, dyspnea, dizziness, & stomach pain.  Myocardial perfusion imaging is normal. Left ventricular ejection fraction is  63% with normal wall motion. Low risk study. No previous exam available for comparison.  Echocardiogram 05/26/2019: Left ventricle cavity is normal in size. Mild concentric hypertrophy of the left ventricle. Normal LV systolic function with EF 55%. Normal global wall motion. Unable to evaluate diastolic function due to atrial fibrillation.  Left atrial cavity is moderately dilated. Likely underestimated by volumetric assessment.  Trileaflet aortic valve. Trace aortic regurgitation. Moderate (Grade III) mitral regurgitation. Mild tricuspid regurgitation. Estimated pulmonary artery systolic pressure is 35 mmHg.  IVC is dilated with blunted respiratory response. Estimated RA pressure 10-15 mmHg. Compared to previous study in 2012, mitral regurgitation, left atrial dilatation increased mild ot moderate. 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   Assessment     ICD-10-CM   1. Persistent atrial fibrillation (HCC)  I48.19 EKG 12-Lead    apixaban (ELIQUIS) 5 MG TABS tablet    CBC    Novel Coronavirus, NAA ([facility])   CHA2DS2-VASCScore: Risk Score 6,  Yearly risk of stroke  9.8%. (HTN, Age, DM, female, PAD)   2. Essential hypertension  B14 Basic metabolic panel  3. Mixed hyperlipidemia  E78.2 atorvastatin (LIPITOR) 10 MG tablet    EKG 06/18/2019: Atrial fibrillation with controlled ventricular response at the rate of 80 bpm, normal axis, anteroseptal infarct old.  Nonspecific T abnormality.  Low-voltage complexes. No significant change from  EKG 05/15/2019   Recommendations:   I reviewed the results of the stress test and echocardiogram to  patient, reassured her.  With regard to atrial fibrillation, she has not started  anticoagulation.  Hence I cannot perform direct current cardioversion until at least she is on 3 weeks of anticoagulation.  She is now willing to start the Eliquis at 5 mg p.o. b.i.d., I'll set her up for cardioversion in 3 weeks and this and benefits have previously been discussed in detail.  I have given her a total of almost 1 months of samples and also 30 day supply free supply to be picked up at the pharmacy.  In the interim will try to get assistance program set up.  Blood pressure is well controlled, no clinical evidence of heart failure, I'll see her back after the cardioversion.  Recent labs from PCP office were reviewed, lipids are elevated despite being on Zetia and have worsened compared to 1 year ago. She would benefit from statin therapy, She was started on Zetia by her PCP, since then patient has discontinued simvastatin 20 mg.  I'll start her on atorvastatin 10 mg daily which would help with lipid control and she can continue to follow-up with Dr. Jani Gravel.  She has diabetes as it is factor as well.  Adrian Prows, MD, [facility] 06/18/2019, 12:21 PM [facility] [place] Pager: [phone] Office: [phone] If no answer Cell [phone]

## 2019-06-18 NOTE — Progress Notes (Signed)
Primary Physician/Referring:  Jani Gravel, MD  Patient ID: Virginia Gilbert, female    DOB: 05-12-1941, 78 y.o.   MRN: [id number]  Chief Complaint  Patient presents with  . Atrial Fibrillation  . Follow-up    4 week  . Results    echo   HPI:    Virginia Gilbert  is a 78 y.o. Caucasian female with uncontrolled type II diabetes mellitus, hyperlipidemia, hypertension, last seen 4 weeks ago with complaints of worsening dyspnea on exertion and leg edema, noted to be in new onset A. fib with EKG changes suggestive of inferior infarct.  In view of her cardioembolic risk being high, she was started on Eliquis.  She underwent Lexiscan nuclear stress testing and echocardiogram and now presents for follow-up.  She did not start Eliquis due to cost.  She is presently back on aspirin.  Except for fatigue that she has noticed recently, no other specific complaints, Chronic dyspnea is stable and denies chest pain.  Past Medical History:  Diagnosis Date  . Diabetes mellitus without complication ([place])   . Hyperlipidemia    Past Surgical History:  Procedure Laterality Date  . PARTIAL HYSTERECTOMY     1977   Social History   Socioeconomic History  . Marital status: Widowed    Spouse name: Not on file  . Number of children: 3  . Years of education: Not on file  . Highest education level: Not on file  Occupational History  . Not on file  Social Needs  . Financial resource strain: Not on file  . Food insecurity    Worry: Not on file    Inability: Not on file  . Transportation needs    Medical: Not on file    Non-medical: Not on file  Tobacco Use  . Smoking status: Former Smoker    Packs/day: 0.50    Years: 15.00    Pack years: 7.50    Types: Cigarettes    Quit date: 05/14/1985    Years since quitting: 34.1  . Smokeless tobacco: Never Used  Substance and Sexual Activity  . Alcohol use: No    Alcohol/week: 0.0 standard drinks  . Drug use: Not on file  . Sexual activity: Not on file   Lifestyle  . Physical activity    Days per week: Not on file    Minutes per session: Not on file  . Stress: Not on file  Relationships  . Social Herbalist on phone: Not on file    Gets together: Not on file    Attends religious service: Not on file    Active member of club or organization: Not on file    Attends meetings of clubs or organizations: Not on file    Relationship status: Not on file  . Intimate partner violence    Fear of current or ex partner: Not on file    Emotionally abused: Not on file    Physically abused: Not on file    Forced sexual activity: Not on file  Other Topics Concern  . Not on file  Social History Narrative  . Not on file   ROS  Review of Systems  Constitution: Positive for malaise/fatigue. Negative for chills, decreased appetite and weight gain.  Cardiovascular: Positive for dyspnea on exertion. Negative for leg swelling, orthopnea and syncope.  Endocrine: Negative for cold intolerance.  Hematologic/Lymphatic: Does not bruise/bleed easily.  Musculoskeletal: Positive for back pain, joint pain (bilateral knee and also hips) and  muscle cramps (night cramps). Negative for joint swelling.  Gastrointestinal: Positive for heartburn. Negative for abdominal pain, anorexia, change in bowel habit, hematochezia and melena.  Neurological: Positive for tremors. Negative for headaches and light-headedness.  Psychiatric/Behavioral: Negative for depression and substance abuse.  All other systems reviewed and are negative.  Objective   Vitals with BMI 06/18/2019 05/15/2019 12/02/2016  Height '4\' 11"'  '4\' 11"'  -  Weight 160 lbs 165 lbs 5 oz -  BMI 32.3 55.73 -  Systolic 220 254 270  Diastolic 88 69 81  Pulse 89 85 84    Blood pressure (!) 155/88, pulse 89, height '4\' 11"'  (1.499 m), weight 160 lb (72.6 kg), SpO2 96 %. Body mass index is 32.32 kg/m.   Physical Exam  Constitutional:  Short stature and mildly obese in no acute distress.  HENT:  Head:  Atraumatic.  Eyes: Conjunctivae are normal.  Neck: Neck supple. No JVD present. No thyromegaly present.  Cardiovascular: Normal rate, normal heart sounds and intact distal pulses. An irregularly irregular rhythm present. Exam reveals no gallop, no S3 and no S4.  No murmur heard. Pulses:      Carotid pulses are 2+ on the right side and 2+ on the left side.      Radial pulses are 2+ on the right side and 2+ on the left side.       Femoral pulses are 2+ on the right side and 2+ on the left side.      Popliteal pulses are 2+ on the right side and 1+ on the left side.       Dorsalis pedis pulses are 2+ on the right side and 1+ on the left side.       Posterior tibial pulses are 2+ on the right side and 1+ on the left side.  S1 is variable, S2 is normal. 1-2+ bilateral ankle edema. No JVD.    Pulmonary/Chest: Effort normal and breath sounds normal.  Abdominal: Soft. Bowel sounds are normal.  Obese abdomen  Musculoskeletal: Normal range of motion.        General: No edema.  Neurological: She is alert.  Skin: Skin is warm and dry.  Psychiatric: She has a normal mood and affect.   Radiology: No results found.  Laboratory examination:   Labs 04/28/2019: EGFR 82 mL, BUN 21, creatinine 0.81, potassium 4.9.  HB 4.6/HCT 40.6, platelets 09/15/59.  Serum cholesterol 141 mg, A1c 8.0%.  Total cholesterol 186, triglycerides 129, HDL 37, LDL 123.    Labs 11/17/2017: Glucose 213.  BUN/creatinine 14/0.66.  EGFR 97.  Sodium 140, potassium 4.4 H/H 13.9/43.1.  MCV 89.6.  Platelets 260. Hemoglobin A1c 8.4% Cholesterol 128, HDL 35, LDL 70, triglycerides 113  No results for input(s): NA, K, CL, CO2, GLUCOSE, BUN, CREATININE, CALCIUM, GFRNONAA, GFRAA in the last 8760 hours. No flowsheet data found. No flowsheet data found. Lipid Panel  No results found for: CHOL, TRIG, HDL, CHOLHDL, VLDL, LDLCALC, LDLDIRECT HEMOGLOBIN A1C No results found for: HGBA1C, MPG TSH No results for input(s): TSH in the last 8760  hours. Medications   Allergies  Allergen Reactions  . Codeine Nausea And Vomiting  . Penicillins Itching  . Tramadol Itching     Prior to Admission medications   Medication Sig Start Date End Date Taking? Authorizing Provider  amLODipine (NORVASC) 5 MG tablet Take 5 mg by mouth daily. 05/10/18  Yes [provider]  Apoaequorin (PREVAGEN PO) Take 1 capsule by mouth daily at 2 PM.   Yes [provider]  aspirin 325 MG tablet Take 325 mg by mouth daily.   Yes [provider]  carvedilol (COREG) 3.125 MG tablet Take 3.125 mg by mouth daily at 2 PM.  04/18/18  Yes [provider]  ezetimibe (ZETIA) 10 MG tablet Take 1 tablet by mouth daily at 2 PM. 02/24/19  Yes [provider]  glimepiride (AMARYL) 4 MG tablet TAKE 1 TABLET BY MOUTH TWICE DAILY WITH BREAKFAST OR THE FIRST MAIN MEAL OF THE DAY 05/08/18  Yes [provider]  hydrochlorothiazide (MICROZIDE) 12.5 MG capsule Take 12.5 mg by mouth daily. 12/14/14  Yes [provider]  losartan (COZAAR) 100 MG tablet TAKE ONE TABLET BY MOUTH EVERY DAY 30 03/06/15  Yes [provider]  metFORMIN (GLUCOPHAGE) 1000 MG tablet TAKE ONE TABLET BY MOUTH TWICE A DAY ORALLY 02/26/15  Yes [provider]  TRULICITY 1.5 VQ/0.0QQ SOPN  05/29/18  Yes [provider]     Current Outpatient Medications  Medication Instructions  . amLODipine (NORVASC) 5 mg, Oral, Daily  . apixaban (ELIQUIS) 5 mg, Oral, 2 times daily  . atorvastatin (LIPITOR) 10 mg, Oral, Daily  . carvedilol (COREG) 3.125 mg, Oral, Daily  . ezetimibe (ZETIA) 10 MG tablet 1 tablet, Oral, Daily  . glimepiride (AMARYL) 4 MG tablet TAKE 1 TABLET BY MOUTH TWICE DAILY WITH BREAKFAST OR THE FIRST MAIN MEAL OF THE DAY  . hydrochlorothiazide (MICROZIDE) 12.5 mg, Oral, Daily  . losartan (COZAAR) 100 MG tablet TAKE ONE TABLET BY MOUTH EVERY DAY 30  . metFORMIN (GLUCOPHAGE) 1000 MG tablet TAKE ONE TABLET BY MOUTH TWICE A DAY  ORALLY  . TRULICITY 1.5 PY/1.9JK SOPN Weekly    Cardiac Studies:   Lexiscan Myoview Stress Test 06/09/2019: Resting EKG atrial fibrillation, low voltage, nonspecific T abnormality.  Stress EKG no change.  Stress symptoms included neck pain, dyspnea, dizziness, & stomach pain.  Myocardial perfusion imaging is normal. Left ventricular ejection fraction is  63% with normal wall motion. Low risk study. No previous exam available for comparison.  Echocardiogram 05/26/2019: Left ventricle cavity is normal in size. Mild concentric hypertrophy of the left ventricle. Normal LV systolic function with EF 55%. Normal global wall motion. Unable to evaluate diastolic function due to atrial fibrillation.  Left atrial cavity is moderately dilated. Likely underestimated by volumetric assessment.  Trileaflet aortic valve. Trace aortic regurgitation. Moderate (Grade III) mitral regurgitation. Mild tricuspid regurgitation. Estimated pulmonary artery systolic pressure is 35 mmHg.  IVC is dilated with blunted respiratory response. Estimated RA pressure 10-15 mmHg. Compared to previous study in 2012, mitral regurgitation, left atrial dilatation increased mild ot moderate. 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   Assessment     ICD-10-CM   1. Persistent atrial fibrillation (HCC)  I48.19 EKG 12-Lead    apixaban (ELIQUIS) 5 MG TABS tablet    CBC    Novel Coronavirus, NAA ([facility])   CHA2DS2-VASCScore: Risk Score 6,  Yearly risk of stroke  9.8%. (HTN, Age, DM, female, PAD)   2. Essential hypertension  D32 Basic metabolic panel  3. Mixed hyperlipidemia  E78.2 atorvastatin (LIPITOR) 10 MG tablet    EKG 06/18/2019: Atrial fibrillation with controlled ventricular response at the rate of 80 bpm, normal axis, anteroseptal infarct old.  Nonspecific T abnormality.  Low-voltage complexes. No significant change from  EKG 05/15/2019   Recommendations:   I reviewed the results of the stress test and echocardiogram to  patient, reassured her.  With regard to atrial fibrillation, she has not started  anticoagulation.  Hence I cannot perform direct current cardioversion until at least she is on 3 weeks of anticoagulation.  She is now willing to start the Eliquis at 5 mg p.o. b.i.d., I'll set her up for cardioversion in 3 weeks and this and benefits have previously been discussed in detail.  I have given her a total of almost 1 months of samples and also 30 day supply free supply to be picked up at the pharmacy.  In the interim will try to get assistance program set up.  Blood pressure is well controlled, no clinical evidence of heart failure, I'll see her back after the cardioversion.  Recent labs from PCP office were reviewed, lipids are elevated despite being on Zetia and have worsened compared to 1 year ago. She would benefit from statin therapy, She was started on Zetia by her PCP, since then patient has discontinued simvastatin 20 mg.  I'll start her on atorvastatin 10 mg daily which would help with lipid control and she can continue to follow-up with Dr. Jani Gravel.  She has diabetes as it is factor as well.  Adrian Prows, MD, [facility] 06/18/2019, 12:21 PM [facility]. [place] Pager: [phone] Office: [phone] If no answer Cell [phone]

## 2019-06-23 ENCOUNTER — Encounter: Payer: Self-pay | Admitting: Internal Medicine

## 2019-06-23 DIAGNOSIS — I10 Essential (primary) hypertension: Secondary | ICD-10-CM | POA: Diagnosis not present

## 2019-06-23 DIAGNOSIS — E118 Type 2 diabetes mellitus with unspecified complications: Secondary | ICD-10-CM | POA: Diagnosis not present

## 2019-06-23 LAB — POCT UA - MICROALBUMIN

## 2019-06-24 ENCOUNTER — Telehealth: Payer: Self-pay

## 2019-06-24 DIAGNOSIS — I10 Essential (primary) hypertension: Secondary | ICD-10-CM | POA: Diagnosis not present

## 2019-06-24 DIAGNOSIS — I4819 Other persistent atrial fibrillation: Secondary | ICD-10-CM | POA: Diagnosis not present

## 2019-06-24 NOTE — Telephone Encounter (Signed)
Stop for 3 days and see if headache goes away and let us know

## 2019-06-24 NOTE — Telephone Encounter (Signed)
Pt called c/o eliquis giving her HA's feeling like it can explode; When she tries to read she cant because she isn't able to see as well since she started the medication; Please advise

## 2019-06-25 LAB — BASIC METABOLIC PANEL
BUN/Creatinine Ratio: 17 (ref 12–28)
BUN: 13 mg/dL (ref 8–27)
CO2: 24 mmol/L (ref 20–29)
Calcium: 9.4 mg/dL (ref 8.7–10.3)
Chloride: 98 mmol/L (ref 96–106)
Creatinine, Ser: 0.76 mg/dL (ref 0.57–1.00)
GFR calc Af Amer: 88 mL/min/{1.73_m2} (ref >59)
GFR calc non Af Amer: 76 mL/min/{1.73_m2} (ref >59)
Glucose: 178 mg/dL — ABNORMAL HIGH (ref 65–99)
Potassium: 4.7 mmol/L (ref 3.5–5.2)
Sodium: 139 mmol/L (ref 134–144)

## 2019-06-25 LAB — CBC
Hematocrit: 41.5 % (ref 34.0–46.6)
Hemoglobin: 13.1 g/dL (ref 11.1–15.9)
MCH: 28.2 pg (ref 26.6–33.0)
MCHC: 31.6 g/dL (ref 31.5–35.7)
MCV: 89 fL (ref 79–97)
Platelets: 181 10*3/uL (ref 150–450)
RBC: 4.65 x10E6/uL (ref 3.77–5.28)
RDW: 13.7 % (ref 11.7–15.4)
WBC: 6.2 10*3/uL (ref 3.4–10.8)

## 2019-06-25 NOTE — Telephone Encounter (Signed)
Pt aware and will call us back on Friday to let us know

## 2019-06-26 DIAGNOSIS — E118 Type 2 diabetes mellitus with unspecified complications: Secondary | ICD-10-CM | POA: Diagnosis not present

## 2019-06-26 DIAGNOSIS — I10 Essential (primary) hypertension: Secondary | ICD-10-CM | POA: Diagnosis not present

## 2019-06-26 DIAGNOSIS — E7800 Pure hypercholesterolemia, unspecified: Secondary | ICD-10-CM | POA: Diagnosis not present

## 2019-06-27 ENCOUNTER — Other Ambulatory Visit (HOSPITAL_COMMUNITY): Payer: Self-pay

## 2019-07-04 ENCOUNTER — Other Ambulatory Visit (HOSPITAL_COMMUNITY)
Admission: RE | Admit: 2019-07-04 | Discharge: 2019-07-04 | Disposition: A | Discharge status: Home / Self Care | Payer: Medicare Other | Source: Ambulatory Visit | Attending: Cardiology | Admitting: Cardiology

## 2019-07-04 DIAGNOSIS — Z20828 Contact with and (suspected) exposure to other viral communicable diseases: Secondary | ICD-10-CM | POA: Diagnosis not present

## 2019-07-04 DIAGNOSIS — Z01812 Encounter for preprocedural laboratory examination: Secondary | ICD-10-CM | POA: Insufficient documentation

## 2019-07-06 LAB — NOVEL CORONAVIRUS, NAA (HOSP ORDER, SEND-OUT TO REF LAB; TAT 18-24 HRS): SARS-CoV-2, NAA: NOT DETECTED

## 2019-07-07 ENCOUNTER — Ambulatory Visit: Payer: Medicare Other | Admitting: Cardiology

## 2019-07-08 ENCOUNTER — Ambulatory Visit (HOSPITAL_COMMUNITY): Payer: Medicare Other | Admitting: Certified Registered Nurse Anesthetist

## 2019-07-08 ENCOUNTER — Ambulatory Visit (HOSPITAL_COMMUNITY)
Admission: RE | Admit: 2019-07-08 | Discharge: 2019-07-08 | Disposition: A | Discharge status: Home / Self Care | Payer: Medicare Other | Attending: Cardiology | Admitting: Cardiology

## 2019-07-08 ENCOUNTER — Encounter (HOSPITAL_COMMUNITY)
Admission: RE | Disposition: A | Discharge status: Home / Self Care | Payer: Self-pay | Source: Home / Self Care | Attending: Cardiology

## 2019-07-08 ENCOUNTER — Other Ambulatory Visit: Payer: Self-pay

## 2019-07-08 ENCOUNTER — Encounter (HOSPITAL_COMMUNITY): Payer: Self-pay | Admitting: Emergency Medicine

## 2019-07-08 DIAGNOSIS — Z87891 Personal history of nicotine dependence: Secondary | ICD-10-CM | POA: Diagnosis not present

## 2019-07-08 DIAGNOSIS — Z885 Allergy status to narcotic agent status: Secondary | ICD-10-CM | POA: Insufficient documentation

## 2019-07-08 DIAGNOSIS — E785 Hyperlipidemia, unspecified: Secondary | ICD-10-CM | POA: Diagnosis not present

## 2019-07-08 DIAGNOSIS — E119 Type 2 diabetes mellitus without complications: Secondary | ICD-10-CM | POA: Insufficient documentation

## 2019-07-08 DIAGNOSIS — Z7984 Long term (current) use of oral hypoglycemic drugs: Secondary | ICD-10-CM | POA: Diagnosis not present

## 2019-07-08 DIAGNOSIS — Z888 Allergy status to other drugs, medicaments and biological substances status: Secondary | ICD-10-CM | POA: Insufficient documentation

## 2019-07-08 DIAGNOSIS — I10 Essential (primary) hypertension: Secondary | ICD-10-CM | POA: Diagnosis not present

## 2019-07-08 DIAGNOSIS — Z7982 Long term (current) use of aspirin: Secondary | ICD-10-CM | POA: Diagnosis not present

## 2019-07-08 DIAGNOSIS — Z7901 Long term (current) use of anticoagulants: Secondary | ICD-10-CM | POA: Diagnosis not present

## 2019-07-08 DIAGNOSIS — I480 Paroxysmal atrial fibrillation: Secondary | ICD-10-CM

## 2019-07-08 DIAGNOSIS — Z880 Allergy status to penicillin: Secondary | ICD-10-CM | POA: Diagnosis not present

## 2019-07-08 DIAGNOSIS — I4891 Unspecified atrial fibrillation: Secondary | ICD-10-CM | POA: Diagnosis not present

## 2019-07-08 DIAGNOSIS — Z79899 Other long term (current) drug therapy: Secondary | ICD-10-CM | POA: Diagnosis not present

## 2019-07-08 DIAGNOSIS — I4819 Other persistent atrial fibrillation: Secondary | ICD-10-CM | POA: Insufficient documentation

## 2019-07-08 DIAGNOSIS — E782 Mixed hyperlipidemia: Secondary | ICD-10-CM | POA: Insufficient documentation

## 2019-07-08 HISTORY — PX: CARDIOVERSION: SHX1299

## 2019-07-08 LAB — POCT I-STAT, CHEM 8
BUN: 15 mg/dL (ref 8–23)
Calcium, Ion: 1.03 mmol/L — ABNORMAL LOW (ref 1.15–1.40)
Chloride: 105 mmol/L (ref 98–111)
Creatinine, Ser: 0.5 mg/dL (ref 0.44–1.00)
Glucose, Bld: 87 mg/dL (ref 70–99)
HCT: 39 % (ref 36.0–46.0)
Hemoglobin: 13.3 g/dL (ref 12.0–15.0)
Potassium: 4.5 mmol/L (ref 3.5–5.1)
Sodium: 138 mmol/L (ref 135–145)
TCO2: 26 mmol/L (ref 22–32)

## 2019-07-08 LAB — GLUCOSE, CAPILLARY: Glucose-Capillary: 85 mg/dL (ref 70–99)

## 2019-07-08 SURGERY — CARDIOVERSION
Anesthesia: General

## 2019-07-08 MED ORDER — PROPOFOL 10 MG/ML IV BOLUS
INTRAVENOUS | Status: DC | PRN
  Administered 2019-07-08: 80 mg via INTRAVENOUS
  Administered 2019-07-08: 20 mg via INTRAVENOUS

## 2019-07-08 MED ORDER — HYDROCORTISONE 1 % EX CREA: TOPICAL_CREAM | Freq: Three times a day (TID) | CUTANEOUS | 0 refills | Status: DC

## 2019-07-08 MED ORDER — HYDROCORTISONE 1 % EX CREA
TOPICAL_CREAM | Freq: Three times a day (TID) | CUTANEOUS | Status: DC
  Filled 2019-07-08: qty 28

## 2019-07-08 MED ORDER — SODIUM CHLORIDE 0.9 % IV SOLN
INTRAVENOUS | Status: DC | PRN
  Administered 2019-07-08: 13:00:00 via INTRAVENOUS

## 2019-07-08 MED ORDER — LIDOCAINE 2% (20 MG/ML) 5 ML SYRINGE
INTRAMUSCULAR | Status: DC | PRN
  Administered 2019-07-08: 60 mg via INTRAVENOUS

## 2019-07-08 MED ORDER — HYDROCORTISONE 1 % EX CREA: TOPICAL_CREAM | Freq: Three times a day (TID) | CUTANEOUS | Status: DC

## 2019-07-08 NOTE — Discharge Instructions (Signed)

## 2019-07-08 NOTE — Transfer of Care (Signed)
Immediate Anesthesia Transfer of Care Note  Patient: Virginia Gilbert  Procedure(s) Performed: CARDIOVERSION (N/A )  Patient Location: Endoscopy Unit  Anesthesia Type:General  Level of Consciousness: drowsy  Airway & Oxygen Therapy: Patient Spontanous Breathing  Post-op Assessment: Report given to RN and Post -op Vital signs reviewed and stable  Post vital signs: Reviewed and stable  Last Vitals:  Vitals Value Taken Time  BP 148/63 07/08/19 1351  Temp    Pulse 80 07/08/19 1351  Resp 21 07/08/19 1351  SpO2 97 % 07/08/19 1351    Last Pain:  Vitals:   07/08/19 1237  TempSrc: Temporal  PainSc: 0-No pain         Complications: No apparent anesthesia complications

## 2019-07-08 NOTE — CV Procedure (Signed)
Direct current cardioversion:  Indication symptomatic A. Fibrillation.  Procedure: Using 100 mg of IV Propofol and 60 IV Lidocaine (for reducing venous pain) for achieving deep sedation, synchronized direct current cardioversion performed. Patient was delivered with 120 x1, 150x1, 200 Joules of electricity X 3 with success to NSR. Patient tolerated the procedure well. No immediate complication noted.    Adrian Prows, MD, [facility] 07/08/2019, 1:48 PM [facility]. [place] Pager: [phone] Office: [phone] If no answer Cell [phone]

## 2019-07-08 NOTE — Anesthesia Procedure Notes (Signed)
Procedure Name: General with mask airway Date/Time: 07/08/2019 1:40 PM Performed by: Candis Shine, CRNA Pre-anesthesia Checklist: Patient identified, Emergency Drugs available, Suction available, Patient being monitored and Timeout performed Patient Re-evaluated:Patient Re-evaluated prior to induction Oxygen Delivery Method: Ambu bag Preoxygenation: Pre-oxygenation with 100% oxygen Induction Type: IV induction Dental Injury: Teeth and Oropharynx as per pre-operative assessment

## 2019-07-08 NOTE — Interval H&P Note (Signed)
History and Physical Interval Note:  07/08/2019 1:37 PM  Virginia Gilbert  has presented today for surgery, with the diagnosis of atrial fibrillation.  The various methods of treatment have been discussed with the patient and family. After consideration of risks, benefits and other options for treatment, the patient has consented to  Procedure(s): CARDIOVERSION (N/A) as a surgical intervention.  The patient's history has been reviewed, patient examined, no change in status, stable for surgery.  I have reviewed the patient's chart and labs.  Questions were answered to the patient's satisfaction.     Adrian Prows

## 2019-07-08 NOTE — Anesthesia Preprocedure Evaluation (Addendum)
Anesthesia Evaluation  Patient identified by MRN, date of birth, ID band Patient awake    Reviewed: Allergy & Precautions, NPO status , Patient's Chart, lab work & pertinent test results  Airway Mallampati: III       Dental  (+) Upper Dentures, Lower Dentures   Pulmonary former smoker,    Pulmonary exam normal        Cardiovascular + dysrhythmias Atrial Fibrillation  Rhythm:Irregular Rate:Normal  ECHO: 1. Left ventricle cavity is normal in size. Mild concentric hypertrophy of the left ventricle. Normal LV systolic function with EF 55%. Normal global wall motion. Unable to evaluate diastolic function due to atrial fibrillation. 2. Left atrial cavity is moderately dilated. Likely underestimated by volumetric assessment. 3. Trileaflet aortic valve. Trace aortic regurgitation. 4. Moderate (Grade III) mitral regurgitation. 5. Mild tricuspid regurgitation. Estimated pulmonary artery systolic pressure is 35 mmHg. 6. IVC is dilated with blunted respiratory response. Estimated RA pressure 10-15 mmHg. 7. Compared to previous study in 2012, mitral regurgitation, left atrial dilatation increased mild ot moderate. 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   Neuro/Psych negative neurological ROS  negative psych ROS   GI/Hepatic negative GI ROS, Neg liver ROS,   Endo/Other  diabetes, Oral Hypoglycemic Agents  Renal/GU negative Renal ROS     Musculoskeletal negative musculoskeletal ROS (+)   Abdominal (+) + obese,   Peds  Hematology HLD   Anesthesia Other Findings atrial fibrillation  Reproductive/Obstetrics                            Anesthesia Physical Anesthesia Plan  ASA: III  Anesthesia Plan: General   Post-op Pain Management:    Induction: Intravenous  PONV Risk Score and Plan: 3 and Propofol infusion and Treatment may vary due to age or medical condition  Airway Management Planned:  Mask  Additional Equipment:   Intra-op Plan:   Post-operative Plan:   Informed Consent: I have reviewed the patients History and Physical, chart, labs and discussed the procedure including the risks, benefits and alternatives for the proposed anesthesia with the patient or authorized representative who has indicated his/her understanding and acceptance.       Plan Discussed with: CRNA  Anesthesia Plan Comments:        Anesthesia Quick Evaluation

## 2019-07-08 NOTE — Anesthesia Postprocedure Evaluation (Signed)
Anesthesia Post Note  Patient: Virginia Gilbert  Procedure(s) Performed: CARDIOVERSION (N/A )     Patient location during evaluation: PACU Anesthesia Type: General Level of consciousness: awake and alert Pain management: pain level controlled Vital Signs Assessment: post-procedure vital signs reviewed and stable Respiratory status: spontaneous breathing, nonlabored ventilation, respiratory function stable and patient connected to nasal cannula oxygen Cardiovascular status: blood pressure returned to baseline and stable Postop Assessment: no apparent nausea or vomiting Anesthetic complications: no    Last Vitals:  Vitals:   07/08/19 1400 07/08/19 1410  BP: (!) 114/58 (!) 118/55  Pulse: 67 63  Resp: (!) 21 (!) 25  Temp:    SpO2: 95% 95%    Last Pain:  Vitals:   07/08/19 1410  TempSrc:   PainSc: 0-No pain                 Chrystian Cupples P Vivien Barretto

## 2019-07-09 ENCOUNTER — Encounter (HOSPITAL_COMMUNITY): Payer: Self-pay | Admitting: Cardiology

## 2019-07-24 ENCOUNTER — Other Ambulatory Visit: Payer: Self-pay

## 2019-07-24 ENCOUNTER — Ambulatory Visit: Payer: Medicare Other | Admitting: Cardiology

## 2019-07-24 ENCOUNTER — Encounter: Payer: Self-pay | Admitting: Cardiology

## 2019-07-24 VITALS — BP 158/82 | HR 98 | Temp 98.0°F | Ht 59.0 in | Wt 157.9 lb

## 2019-07-24 DIAGNOSIS — E7800 Pure hypercholesterolemia, unspecified: Secondary | ICD-10-CM | POA: Diagnosis not present

## 2019-07-24 DIAGNOSIS — R0602 Shortness of breath: Secondary | ICD-10-CM

## 2019-07-24 DIAGNOSIS — I10 Essential (primary) hypertension: Secondary | ICD-10-CM | POA: Diagnosis not present

## 2019-07-24 DIAGNOSIS — I480 Paroxysmal atrial fibrillation: Secondary | ICD-10-CM

## 2019-07-24 DIAGNOSIS — G5712 Meralgia paresthetica, left lower limb: Secondary | ICD-10-CM | POA: Diagnosis not present

## 2019-07-24 DIAGNOSIS — E118 Type 2 diabetes mellitus with unspecified complications: Secondary | ICD-10-CM | POA: Diagnosis not present

## 2019-07-24 DIAGNOSIS — I4891 Unspecified atrial fibrillation: Secondary | ICD-10-CM | POA: Insufficient documentation

## 2019-07-24 MED ORDER — AMIODARONE HCL 200 MG PO TABS: 200.0000 mg | ORAL_TABLET | Freq: Two times a day (BID) | ORAL | 3 refills | Status: DC

## 2019-07-24 NOTE — Progress Notes (Signed)
Primary Physician/Referring:  Jani Gravel, MD  Patient ID: Virginia Gilbert, female    DOB: 08/14/1941, 78 y.o.   MRN: [id number]  Chief Complaint  Patient presents with  . Atrial Fibrillation   HPI:    SUSANNE BAUMGARNER  is a 78 y.o. Caucasian female with uncontrolled type II diabetes mellitus, hyperlipidemia, hypertension, Symptomatic atrial fibrillation with dyspnea and also fatigue, underwent direct current cardioversion on 07/08/2019 to sinus rhythm.  She now presents here for follow-up.  She had felt well but a week ago after she returned back from having stepped out, suddenly felt short of breath and tired again and thought that she may be in atrial fibrillation.  Past Medical History:  Diagnosis Date  . Diabetes mellitus without complication ([place])   . HTN (hypertension)   . Hyperlipidemia   . Hypertension    Past Surgical History:  Procedure Laterality Date  . CARDIOVERSION N/A 07/08/2019   Procedure: CARDIOVERSION;  Surgeon: Adrian Prows, MD;  Location: [facility];  Service: Cardiovascular;  Laterality: N/A;  . PARTIAL HYSTERECTOMY     1977   Social History   Socioeconomic History  . Marital status: Widowed    Spouse name: Not on file  . Number of children: 3  . Years of education: Not on file  . Highest education level: Not on file  Occupational History  . Not on file  Tobacco Use  . Smoking status: Former Smoker    Packs/day: 0.50    Years: 15.00    Pack years: 7.50    Types: Cigarettes    Quit date: 05/14/1985    Years since quitting: 34.2  . Smokeless tobacco: Never Used  Substance and Sexual Activity  . Alcohol use: No    Alcohol/week: 0.0 standard drinks  . Drug use: Not on file  . Sexual activity: Not on file  Other Topics Concern  . Not on file  Social History Narrative  . Not on file   Social Determinants of Health   Financial Resource Strain:   . Difficulty of Paying Living Expenses: Not on file  Food Insecurity:   . Worried About Paediatric nurse in the Last Year: Not on file  . Ran Out of Food in the Last Year: Not on file  Transportation Needs:   . Lack of Transportation (Medical): Not on file  . Lack of Transportation (Non-Medical): Not on file  Physical Activity:   . Days of Exercise per Week: Not on file  . Minutes of Exercise per Session: Not on file  Stress:   . Feeling of Stress : Not on file  Social Connections:   . Frequency of Communication with Friends and Family: Not on file  . Frequency of Social Gatherings with Friends and Family: Not on file  . Attends Religious Services: Not on file  . Active Member of Clubs or Organizations: Not on file  . Attends Archivist Meetings: Not on file  . Marital Status: Not on file  Intimate Partner Violence:   . Fear of Current or Ex-Partner: Not on file  . Emotionally Abused: Not on file  . Physically Abused: Not on file  . Sexually Abused: Not on file   ROS  Review of Systems  Constitution: Positive for malaise/fatigue. Negative for chills, decreased appetite and weight gain.  Cardiovascular: Positive for dyspnea on exertion. Negative for leg swelling, orthopnea and syncope.  Endocrine: Negative for cold intolerance.  Hematologic/Lymphatic: Does not bruise/bleed easily.  Musculoskeletal: Positive  for back pain, joint pain (bilateral knee and also hips) and muscle cramps (night cramps). Negative for joint swelling.  Gastrointestinal: Positive for heartburn. Negative for abdominal pain, anorexia, change in bowel habit, hematochezia and melena.  Neurological: Positive for tremors. Negative for headaches and light-headedness.  Psychiatric/Behavioral: Negative for depression and substance abuse.  All other systems reviewed and are negative.  Objective   Vitals with BMI 07/24/2019 07/08/2019 07/08/2019  Height '4\' 11"'  - -  Weight 157 lbs 14 oz - -  BMI 46.65 - -  Systolic 993 570 177  Diastolic 82 55 58  Pulse 98 63 67    Blood pressure (!) 158/82,  pulse 98, temperature 98 F (36.7 C), height '4\' 11"'  (1.499 m), weight 157 lb 14.4 oz (71.6 kg), SpO2 96 %. Body mass index is 31.89 kg/m.   Physical Exam  Constitutional:  Short stature and mildly obese in no acute distress.  HENT:  Head: Atraumatic.  Eyes: Conjunctivae are normal.  Neck: No JVD present. No thyromegaly present.  Cardiovascular: Normal rate, normal heart sounds and intact distal pulses. An irregularly irregular rhythm present. Exam reveals no gallop, no S3 and no S4.  No murmur heard. Pulses:      Carotid pulses are 2+ on the right side and 2+ on the left side.      Radial pulses are 2+ on the right side and 2+ on the left side.       Femoral pulses are 2+ on the right side and 2+ on the left side.      Popliteal pulses are 2+ on the right side and 1+ on the left side.       Dorsalis pedis pulses are 2+ on the right side and 1+ on the left side.       Posterior tibial pulses are 2+ on the right side and 1+ on the left side.  S1 is variable, S2 is normal. No edema. No JVD.    Pulmonary/Chest: Effort normal and breath sounds normal.  Abdominal: Soft. Bowel sounds are normal.  Obese abdomen  Musculoskeletal:        General: No edema. Normal range of motion.     Cervical back: Neck supple.  Neurological: She is alert.  Skin: Skin is warm and dry.  Psychiatric: She has a normal mood and affect.   Radiology: No results found.  Laboratory examination:   Labs 04/28/2019: EGFR 82 mL, BUN 21, creatinine 0.81, potassium 4.9.  HB 4.6/HCT 40.6, platelets 09/15/59.  Serum cholesterol 141 mg, A1c 8.0%.  Total cholesterol 186, triglycerides 129, HDL 37, LDL 123.    Labs 11/17/2017: Glucose 213.  BUN/creatinine 14/0.66.  EGFR 97.  Sodium 140, potassium 4.4 H/H 13.9/43.1.  MCV 89.6.  Platelets 260. Hemoglobin A1c 8.4% Cholesterol 128, HDL 35, LDL 70, triglycerides 113  Recent Labs    06/24/19 1042 07/08/19 1302  NA 139 138  K 4.7 4.5  CL 98 105  CO2 24  --   GLUCOSE  178* 87  BUN 13 15  CREATININE 0.76 0.50  CALCIUM 9.4  --   GFRNONAA 76  --   GFRAA 88  --    CMP Latest Ref Rng & Units 07/08/2019 06/24/2019  Glucose 70 - 99 mg/dL 87 178(H)  BUN 8 - 23 mg/dL 15 13  Creatinine 0.44 - 1.00 mg/dL 0.50 0.76  Sodium 135 - 145 mmol/L 138 139  Potassium 3.5 - 5.1 mmol/L 4.5 4.7  Chloride 98 - 111 mmol/L 105 98  CO2 20 - 29 mmol/L - 24  Calcium 8.7 - 10.3 mg/dL - 9.4   CBC Latest Ref Rng & Units 07/08/2019 06/24/2019  WBC 3.4 - 10.8 x10E3/uL - 6.2  Hemoglobin 12.0 - 15.0 g/dL 13.3 13.1  Hematocrit 36.0 - 46.0 % 39.0 41.5  Platelets 150 - 450 x10E3/uL - 181   Lipid Panel  No results found for: CHOL, TRIG, HDL, CHOLHDL, VLDL, LDLCALC, LDLDIRECT HEMOGLOBIN A1C No results found for: HGBA1C, MPG TSH No results for input(s): TSH in the last 8760 hours. Medications   Allergies  Allergen Reactions  . Codeine Nausea And Vomiting  . Penicillins Anaphylaxis    Did it involve swelling of the face/tongue/throat, SOB, or low BP? Yes Did it involve sudden or severe rash/hives, skin peeling, or any reaction on the inside of your mouth or nose? Unknown Did you need to seek medical attention at a hospital or doctor's office? Yes--inpatient when reaction occurred When did it last happen?1964 or 1965 If all above answers are "NO", may proceed with cephalosporin use.   . Tramadol Itching     Prior to Admission medications   Medication Sig Start Date End Date Taking? Authorizing Provider  amLODipine (NORVASC) 5 MG tablet Take 5 mg by mouth daily. 05/10/18  Yes [provider]  Apoaequorin (PREVAGEN PO) Take 1 capsule by mouth daily at 2 PM.   Yes [provider]  aspirin 325 MG tablet Take 325 mg by mouth daily.   Yes [provider]  carvedilol (COREG) 3.125 MG tablet Take 3.125 mg by mouth daily at 2 PM.  04/18/18  Yes [provider]  ezetimibe (ZETIA) 10 MG tablet Take 1 tablet by mouth daily at 2 PM. 02/24/19  Yes  [provider]  glimepiride (AMARYL) 4 MG tablet TAKE 1 TABLET BY MOUTH TWICE DAILY WITH BREAKFAST OR THE FIRST MAIN MEAL OF THE DAY 05/08/18  Yes [provider]  hydrochlorothiazide (MICROZIDE) 12.5 MG capsule Take 12.5 mg by mouth daily. 12/14/14  Yes [provider]  losartan (COZAAR) 100 MG tablet TAKE ONE TABLET BY MOUTH EVERY DAY 30 03/06/15  Yes [provider]  metFORMIN (GLUCOPHAGE) 1000 MG tablet TAKE ONE TABLET BY MOUTH TWICE A DAY ORALLY 02/26/15  Yes [provider]  TRULICITY 1.5 RA/0.7MA SOPN  05/29/18  Yes [provider]     Current Outpatient Medications  Medication Instructions  . amiodarone (PACERONE) 200 mg, Oral, 2 times daily  . amLODipine (NORVASC) 5 mg, Oral, Daily  . apixaban (ELIQUIS) 5 mg, Oral, 2 times daily  . carvedilol (COREG) 3.125 mg, Oral, 2 times daily  . ezetimibe (ZETIA) 10 mg, Oral, Every evening  . glimepiride (AMARYL) 4 mg, Oral, 2 times daily  . hydrochlorothiazide (HYDRODIURIL) 12.5 mg, Oral, Daily  . hydrocortisone cream 1 % Topical, 3 times daily  . losartan (COZAAR) 100 mg, Oral, Daily  . Magnesium-Potassium 250-100 MG TABS 1 tablet, Oral, 2 times daily  . metFORMIN (GLUCOPHAGE) 1,000 mg, Oral, 2 times daily  . Omega-3 Fatty Acids (FISH OIL PO) 1 capsule, Oral, Daily  . Polyethyl Glycol-Propyl Glycol (LUBRICANT EYE DROPS) 0.4-0.3 % SOLN 1 drop, Both Eyes, 3 times daily PRN  . Tresiba 5 Units, Subcutaneous, Daily  . Trulicity 1.5 mg, Subcutaneous, Every Fri    Cardiac Studies:   Carotid artery duplex 2016-05-11: Mild plaque, Heterogeneous.No  Hemodynamically significant stenosis.  Right ICA less than 50% stenosis.  Bilateral antegrade vertebral flow. 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  Lexiscan Myoview Stress Test 06/09/2019: Resting EKG atrial fibrillation, low voltage, nonspecific T abnormality.  Stress EKG no change.  Stress symptoms included neck pain, dyspnea,  dizziness, & stomach pain.  Myocardial perfusion imaging is normal. Left ventricular ejection fraction is  63% with normal wall motion. Low risk study. No previous exam available for comparison.  Echocardiogram 05/26/2019: Left ventricle cavity is normal in size. Mild concentric hypertrophy of the left ventricle. Normal LV systolic function with EF 55%. Normal global wall motion. Unable to evaluate diastolic function due to atrial fibrillation.  Left atrial cavity is moderately dilated. Likely underestimated by volumetric assessment.  Trileaflet aortic valve. Trace aortic regurgitation. Moderate (Grade III) mitral regurgitation. Mild tricuspid regurgitation. Estimated pulmonary artery systolic pressure is 35 mmHg.  IVC is dilated with blunted respiratory response. Estimated RA pressure 10-15 mmHg. Compared to previous study in 2012, mitral regurgitation, left atrial dilatation increased mild ot moderate. 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   Direct current cardioversion 07/08/2019:  Indication symptomatic A. Fibrillation.  Procedure: Using 100 mg of IV Propofol and 60 IV Lidocaine (for reducing venous pain) for achieving deep sedation, synchronized direct current cardioversion performed. Patient was delivered with 120 x1, 150x1, 200 Joules of electricity X 3 with success to NSR. Patient tolerated the procedure well. No immediate complication noted.    Assessment     ICD-10-CM   1. Paroxysmal atrial fibrillation (HCC)  I48.0 EKG 12-Lead    amiodarone (PACERONE) 200 MG tablet   CHA2DS2-VASc Score is 5.  Yearly risk of stroke: 6.7%.( A, S, HTN, Vasc Dz)    2. Shortness of breath  R06.02   3. Essential hypertension  I10     EKG:/05/2019: Atrial fibrillation with controlled ventricular response at rate of 102 bpm, normal axis, anteroseptal infarct old.  Nonspecific T abnormality. No significant change from  EKG 06/18/2019 and EKG 05/15/2019   Recommendations:   MISCHELL BRANFORD  is a 78  y.o. Caucasian female with uncontrolled type II diabetes mellitus, hyperlipidemia, hypertension, Symptomatic atrial fibrillation with dyspnea and also fatigue, underwent direct current cardioversion on 07/08/2019 to sinus rhythm.  She now presents here for follow-up.  She noticed dyspnea and fatigue that started about 10 days ago after she returned home and thought that she was in atrial fibrillation.  Today's EKG confirms presence of atrial fibrillation.  I'll start her on amiodarone 2 mg p.o. b.i.d., set her up for direct current cardioversion in 2 weeks.  After cardioversion I'll reduce the dose of amiodarone to 200 mg once a day.  Blood pressure is well controlled, no clinical evidence of heart failure. She is also tolerating statins. I'll see her back after the cardioversion.   Adrian Prows, MD, [facility] 07/24/2019, 10:33 AM [facility]. [place] Pager: [phone] Office: [phone] If no answer Cell [phone]

## 2019-07-28 ENCOUNTER — Telehealth: Payer: Self-pay

## 2019-07-28 NOTE — Telephone Encounter (Signed)
She sees Dr. Einar Gip, although cardioversion was scheduled with me. Has appt with Dr. Einar Gip on 1/111.  Thanks MJP

## 2019-07-28 NOTE — Telephone Encounter (Signed)
Hello,   Virginia Gilbert has cx her cardioversion for 12/22- stated she will call back to sch this app.- She would like to keep her fu for 1/11.   Thank you.

## 2019-08-02 ENCOUNTER — Other Ambulatory Visit (HOSPITAL_COMMUNITY): Payer: Self-pay

## 2019-08-05 ENCOUNTER — Ambulatory Visit (HOSPITAL_COMMUNITY): Admission: RE | Admit: 2019-08-05 | Payer: Medicare Other | Source: Home / Self Care | Admitting: Cardiology

## 2019-08-05 ENCOUNTER — Encounter (HOSPITAL_COMMUNITY): Admission: RE | Payer: Self-pay | Source: Home / Self Care

## 2019-08-05 SURGERY — CARDIOVERSION
Anesthesia: General

## 2019-08-12 DIAGNOSIS — H35363 Drusen (degenerative) of macula, bilateral: Secondary | ICD-10-CM | POA: Diagnosis not present

## 2019-08-12 DIAGNOSIS — E113292 Type 2 diabetes mellitus with mild nonproliferative diabetic retinopathy without macular edema, left eye: Secondary | ICD-10-CM | POA: Diagnosis not present

## 2019-08-25 ENCOUNTER — Ambulatory Visit: Payer: Medicare Other | Admitting: Cardiology

## 2019-08-25 ENCOUNTER — Other Ambulatory Visit: Payer: Self-pay

## 2019-08-25 ENCOUNTER — Encounter: Payer: Self-pay | Admitting: Cardiology

## 2019-08-25 VITALS — BP 142/70 | HR 83 | Temp 98.2°F | Ht 59.0 in | Wt 156.0 lb

## 2019-08-25 DIAGNOSIS — I10 Essential (primary) hypertension: Secondary | ICD-10-CM | POA: Diagnosis not present

## 2019-08-25 DIAGNOSIS — I480 Paroxysmal atrial fibrillation: Secondary | ICD-10-CM | POA: Diagnosis not present

## 2019-08-25 DIAGNOSIS — R0602 Shortness of breath: Secondary | ICD-10-CM | POA: Diagnosis not present

## 2019-08-25 NOTE — Progress Notes (Signed)
Primary Physician/Referring:  Jani Gravel, MD  Patient ID: Virginia Gilbert, female    DOB: 26-Jun-1941, 79 y.o.   MRN: [id number]  Chief Complaint  Patient presents with  . Atrial Fibrillation  . Shortness of Breath  . Hypertension  . Follow-up    Post cardioversion   HPI:    Virginia Gilbert  is a 79 y.o. Caucasian female with uncontrolled type II diabetes mellitus, hyperlipidemia, hypertension, Symptomatic atrial fibrillation with dyspnea and also fatigue, underwent direct current cardioversion on 07/08/2019 to sinus rhythm; however, was found to be back in A fib and was rescheduled for cardioversion and started on Amiodarone. She cancelled cardioversion due to the holidays.  She is tolerating medications well. Continues to have dyspnea on exertion. No dizziness/lightheadedness. No heart racing.   Past Medical History:  Diagnosis Date  . A-fib ([place])   . Diabetes mellitus without complication ([place])   . HTN (hypertension)   . Hyperlipidemia   . Hypertension    Past Surgical History:  Procedure Laterality Date  . CARDIOVERSION N/A 07/08/2019   Procedure: CARDIOVERSION;  Surgeon: Adrian Prows, MD;  Location: [facility];  Service: Cardiovascular;  Laterality: N/A;  . PARTIAL HYSTERECTOMY     1977   Social History   Socioeconomic History  . Marital status: Widowed    Spouse name: Not on file  . Number of children: 3  . Years of education: Not on file  . Highest education level: Not on file  Occupational History  . Not on file  Tobacco Use  . Smoking status: Former Smoker    Packs/day: 0.50    Years: 15.00    Pack years: 7.50    Types: Cigarettes    Quit date: 05/14/1985    Years since quitting: 34.3  . Smokeless tobacco: Never Used  Substance and Sexual Activity  . Alcohol use: No    Alcohol/week: 0.0 standard drinks  . Drug use: Not on file  . Sexual activity: Not on file  Other Topics Concern  . Not on file  Social History Narrative  . Not on file   Social  Determinants of Health   Financial Resource Strain:   . Difficulty of Paying Living Expenses: Not on file  Food Insecurity:   . Worried About Charity fundraiser in the Last Year: Not on file  . Ran Out of Food in the Last Year: Not on file  Transportation Needs:   . Lack of Transportation (Medical): Not on file  . Lack of Transportation (Non-Medical): Not on file  Physical Activity:   . Days of Exercise per Week: Not on file  . Minutes of Exercise per Session: Not on file  Stress:   . Feeling of Stress : Not on file  Social Connections:   . Frequency of Communication with Friends and Family: Not on file  . Frequency of Social Gatherings with Friends and Family: Not on file  . Attends Religious Services: Not on file  . Active Member of Clubs or Organizations: Not on file  . Attends Archivist Meetings: Not on file  . Marital Status: Not on file  Intimate Partner Violence:   . Fear of Current or Ex-Partner: Not on file  . Emotionally Abused: Not on file  . Physically Abused: Not on file  . Sexually Abused: Not on file   ROS  Review of Systems  Constitution: Positive for malaise/fatigue. Negative for chills, decreased appetite and weight gain.  Cardiovascular: Positive for dyspnea on  exertion. Negative for leg swelling, orthopnea and syncope.  Endocrine: Negative for cold intolerance.  Hematologic/Lymphatic: Does not bruise/bleed easily.  Musculoskeletal: Positive for back pain, joint pain (bilateral knee and also hips) and muscle cramps (night cramps). Negative for joint swelling.  Gastrointestinal: Positive for heartburn. Negative for abdominal pain, anorexia, change in bowel habit, hematochezia and melena.  Neurological: Positive for tremors. Negative for headaches and light-headedness.  Psychiatric/Behavioral: Negative for depression and substance abuse.  All other systems reviewed and are negative.  Objective   Vitals with BMI 08/25/2019 07/24/2019 07/08/2019    Height '4\' 11"'  '4\' 11"'  -  Weight 156 lbs 157 lbs 14 oz -  BMI 61.60 73.71 -  Systolic 062 694 854  Diastolic 77 82 55  Pulse 83 98 63    Blood pressure (!) 168/77, pulse 83, temperature 98.2 F (36.8 C), height '4\' 11"'  (1.499 m), weight 156 lb (70.8 kg), SpO2 96 %. Body mass index is 31.51 kg/m.   Physical Exam  Constitutional:  Short stature and mildly obese in no acute distress.  HENT:  Head: Atraumatic.  Eyes: Conjunctivae are normal.  Neck: No JVD present. No thyromegaly present.  Cardiovascular: Normal rate, normal heart sounds and intact distal pulses. An irregularly irregular rhythm present. Exam reveals no gallop, no S3 and no S4.  No murmur heard. Pulses:      Carotid pulses are 2+ on the right side and 2+ on the left side.      Radial pulses are 2+ on the right side and 2+ on the left side.       Femoral pulses are 2+ on the right side and 2+ on the left side.      Popliteal pulses are 2+ on the right side and 1+ on the left side.       Dorsalis pedis pulses are 2+ on the right side and 1+ on the left side.       Posterior tibial pulses are 2+ on the right side and 1+ on the left side.  S1 is variable, S2 is normal. No edema. No JVD.    Pulmonary/Chest: Effort normal and breath sounds normal.  Abdominal: Soft. Bowel sounds are normal.  Obese abdomen  Musculoskeletal:        General: No edema. Normal range of motion.     Cervical back: Neck supple.  Neurological: She is alert.  Skin: Skin is warm and dry.  Psychiatric: She has a normal mood and affect.   Radiology: No results found.  Laboratory examination:   Labs 04/28/2019: EGFR 82 mL, BUN 21, creatinine 0.81, potassium 4.9.  HB 4.6/HCT 40.6, platelets 09/15/59.  Serum cholesterol 141 mg, A1c 8.0%.  Total cholesterol 186, triglycerides 129, HDL 37, LDL 123.    Labs 11/17/2017: Glucose 213.  BUN/creatinine 14/0.66.  EGFR 97.  Sodium 140, potassium 4.4 H/H 13.9/43.1.  MCV 89.6.  Platelets 260. Hemoglobin A1c  8.4% Cholesterol 128, HDL 35, LDL 70, triglycerides 113  Recent Labs    06/24/19 1042 07/08/19 1302  NA 139 138  K 4.7 4.5  CL 98 105  CO2 24  --   GLUCOSE 178* 87  BUN 13 15  CREATININE 0.76 0.50  CALCIUM 9.4  --   GFRNONAA 76  --   GFRAA 88  --    CMP Latest Ref Rng & Units 07/08/2019 06/24/2019  Glucose 70 - 99 mg/dL 87 178(H)  BUN 8 - 23 mg/dL 15 13  Creatinine 0.44 - 1.00 mg/dL 0.50 0.76  Sodium 135 - 145 mmol/L 138 139  Potassium 3.5 - 5.1 mmol/L 4.5 4.7  Chloride 98 - 111 mmol/L 105 98  CO2 20 - 29 mmol/L - 24  Calcium 8.7 - 10.3 mg/dL - 9.4   CBC Latest Ref Rng & Units 07/08/2019 06/24/2019  WBC 3.4 - 10.8 x10E3/uL - 6.2  Hemoglobin 12.0 - 15.0 g/dL 13.3 13.1  Hematocrit 36.0 - 46.0 % 39.0 41.5  Platelets 150 - 450 x10E3/uL - 181   Lipid Panel  No results found for: CHOL, TRIG, HDL, CHOLHDL, VLDL, LDLCALC, LDLDIRECT HEMOGLOBIN A1C No results found for: HGBA1C, MPG TSH No results for input(s): TSH in the last 8760 hours. Medications   Allergies  Allergen Reactions  . Codeine Nausea And Vomiting  . Penicillins Anaphylaxis    Did it involve swelling of the face/tongue/throat, SOB, or low BP? Yes Did it involve sudden or severe rash/hives, skin peeling, or any reaction on the inside of your mouth or nose? Unknown Did you need to seek medical attention at a hospital or doctor's office? Yes--inpatient when reaction occurred When did it last happen?1964 or 1965 If all above answers are "NO", may proceed with cephalosporin use.   . Tramadol Itching     Prior to Admission medications   Medication Sig Start Date End Date Taking? Authorizing Provider  amLODipine (NORVASC) 5 MG tablet Take 5 mg by mouth daily. 05/10/18  Yes [provider]  Apoaequorin (PREVAGEN PO) Take 1 capsule by mouth daily at 2 PM.   Yes [provider]  aspirin 325 MG tablet Take 325 mg by mouth daily.   Yes [provider]  carvedilol (COREG) 3.125 MG  tablet Take 3.125 mg by mouth daily at 2 PM.  04/18/18  Yes [provider]  ezetimibe (ZETIA) 10 MG tablet Take 1 tablet by mouth daily at 2 PM. 02/24/19  Yes [provider]  glimepiride (AMARYL) 4 MG tablet TAKE 1 TABLET BY MOUTH TWICE DAILY WITH BREAKFAST OR THE FIRST MAIN MEAL OF THE DAY 05/08/18  Yes [provider]  hydrochlorothiazide (MICROZIDE) 12.5 MG capsule Take 12.5 mg by mouth daily. 12/14/14  Yes [provider]  losartan (COZAAR) 100 MG tablet TAKE ONE TABLET BY MOUTH EVERY DAY 30 03/06/15  Yes [provider]  metFORMIN (GLUCOPHAGE) 1000 MG tablet TAKE ONE TABLET BY MOUTH TWICE A DAY ORALLY 02/26/15  Yes [provider]  TRULICITY 1.5 ON/6.2XB SOPN  05/29/18  Yes [provider]     Current Outpatient Medications  Medication Instructions  . amiodarone (PACERONE) 200 mg, Oral, 2 times daily  . amLODipine (NORVASC) 5 mg, Oral, Daily  . apixaban (ELIQUIS) 5 mg, Oral, 2 times daily  . carvedilol (COREG) 3.125 mg, Oral, 2 times daily  . glimepiride (AMARYL) 4 mg, Oral, 2 times daily  . hydrochlorothiazide (HYDRODIURIL) 12.5 mg, Oral, Daily  . hydrocortisone cream 1 % Topical, 3 times daily  . losartan (COZAAR) 100 mg, Oral, Daily  . Magnesium-Potassium 250-100 MG TABS 1 tablet, Oral, 2 times daily  . metFORMIN (GLUCOPHAGE) 1,000 mg, Oral, 2 times daily  . multivitamin-lutein (OCUVITE-LUTEIN) CAPS capsule 1 capsule, Oral, Daily  . Omega-3 Fatty Acids (FISH OIL PO) 1 capsule, Oral, Daily  . Tresiba 5 Units, Subcutaneous, Daily  . Trulicity 1.5 mg, Subcutaneous, Every Fri    Cardiac Studies:   Carotid artery duplex 2016/04/29: Mild plaque, Heterogeneous.No  Hemodynamically significant stenosis.  Right ICA less than 50% stenosis.  Bilateral antegrade vertebral flow. Solid  and cystic thyroid nodules present bilaterally.  Lexiscan Myoview Stress Test 06/09/2019: Resting EKG atrial fibrillation, low voltage, nonspecific T  abnormality.  Stress EKG no change.  Stress symptoms included neck pain, dyspnea, dizziness, & stomach pain.  Myocardial perfusion imaging is normal. Left ventricular ejection fraction is  63% with normal wall motion. Low risk study. No previous exam available for comparison.  Echocardiogram 05/26/2019: Left ventricle cavity is normal in size. Mild concentric hypertrophy of the left ventricle. Normal LV systolic function with EF 55%. Normal global wall motion. Unable to evaluate diastolic function due to atrial fibrillation.  Left atrial cavity is moderately dilated. Likely underestimated by volumetric assessment.  Trileaflet aortic valve. Trace aortic regurgitation. Moderate (Grade III) mitral regurgitation. Mild tricuspid regurgitation. Estimated pulmonary artery systolic pressure is 35 mmHg.  IVC is dilated with blunted respiratory response. Estimated RA pressure 10-15 mmHg. Compared to previous study in 2012, mitral regurgitation, left atrial dilatation increased mild ot moderate. 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   Direct current cardioversion 07/08/2019:  Indication symptomatic A. Fibrillation.  Procedure: Using 100 mg of IV Propofol and 60 IV Lidocaine (for reducing venous pain) for achieving deep sedation, synchronized direct current cardioversion performed. Patient was delivered with 120 x1, 150x1, 200 Joules of electricity X 3 with success to NSR. Patient tolerated the procedure well. No immediate complication noted.    Assessment     ICD-10-CM   1. Paroxysmal atrial fibrillation (HCC) CHA2DS2-VASc Score is 5.  Yearly risk of stroke: 6.7%.( A, S, HTN, Vasc Dz)    I48.0 EKG 12-Lead  2. Shortness of breath  R06.02   3. Essential hypertension  I10     EKG:08/25/2019: Atrial fibrillation with controlled ventricular response at rate of 76 bpm, normal axis, anteroseptal infarct old.  Nonspecific T abnormality. No significant change from  EKG 06/18/2019 and EKG 05/15/2019     Recommendations:   Virginia Gilbert  is a 79 y.o. Caucasian female with uncontrolled type II diabetes mellitus, hyperlipidemia, hypertension, Symptomatic atrial fibrillation with dyspnea and also fatigue, underwent direct current cardioversion on 07/08/2019 to sinus rhythm; however, again developed recurrence of A fib. She is now on Amiodarone and was scheduled for cardioversion, but decided to cancel this due to the holidays..  She now presents here for follow-up.  She is tolerating amiodarone and other medications well. She continues to have dyspnea on exertion. She continues to be in atrial fibrillation that is much better rate controlled with amiodarone. As she does continue to have symptoms, will reschedule cardioversion. Schedule for Direct current cardioversion. I have discussed regarding risks benefits rate control vs rhythm control with the patient. Patient understands cardiac arrest and need for CPR, aspiration pneumonia, but not limited to these. Patient is willing.  Continue with Amiodarone for now. No bleeding diathesis.   Blood pressure was initially elevated, but on recheck had improved. Will continue to monitor. We will see her back after her cardioversion.    *I have discussed this case with Dr. Einar Gip and he personally examined the patient and participated in formulating the plan.*  Miquel Dunn, MSN, APRN, FNP-C [facility]. [place] Office: [phone] Fax: [phone]

## 2019-08-25 NOTE — H&P (View-Only) (Signed)
Primary Physician/Referring:  Jani Gravel, MD  Patient ID: Virginia Gilbert, female    DOB: Sep 10, 1940, 79 y.o.   MRN: [id number]  Chief Complaint  Patient presents with  . Atrial Fibrillation  . Shortness of Breath  . Hypertension  . Follow-up    Post cardioversion   HPI:    Virginia Gilbert  is a 79 y.o. Caucasian female with uncontrolled type II diabetes mellitus, hyperlipidemia, hypertension, Symptomatic atrial fibrillation with dyspnea and also fatigue, underwent direct current cardioversion on 07/08/2019 to sinus rhythm; however, was found to be back in A fib and was rescheduled for cardioversion and started on Amiodarone. She cancelled cardioversion due to the holidays.  She is tolerating medications well. Continues to have dyspnea on exertion. No dizziness/lightheadedness. No heart racing.   Past Medical History:  Diagnosis Date  . A-fib ([place])   . Diabetes mellitus without complication ([place])   . HTN (hypertension)   . Hyperlipidemia   . Hypertension    Past Surgical History:  Procedure Laterality Date  . CARDIOVERSION N/A 07/08/2019   Procedure: CARDIOVERSION;  Surgeon: Adrian Prows, MD;  Location: [facility];  Service: Cardiovascular;  Laterality: N/A;  . PARTIAL HYSTERECTOMY     1977   Social History   Socioeconomic History  . Marital status: Widowed    Spouse name: Not on file  . Number of children: 3  . Years of education: Not on file  . Highest education level: Not on file  Occupational History  . Not on file  Tobacco Use  . Smoking status: Former Smoker    Packs/day: 0.50    Years: 15.00    Pack years: 7.50    Types: Cigarettes    Quit date: 05/14/1985    Years since quitting: 34.3  . Smokeless tobacco: Never Used  Substance and Sexual Activity  . Alcohol use: No    Alcohol/week: 0.0 standard drinks  . Drug use: Not on file  . Sexual activity: Not on file  Other Topics Concern  . Not on file  Social History Narrative  . Not on file   Social  Determinants of Health   Financial Resource Strain:   . Difficulty of Paying Living Expenses: Not on file  Food Insecurity:   . Worried About Charity fundraiser in the Last Year: Not on file  . Ran Out of Food in the Last Year: Not on file  Transportation Needs:   . Lack of Transportation (Medical): Not on file  . Lack of Transportation (Non-Medical): Not on file  Physical Activity:   . Days of Exercise per Week: Not on file  . Minutes of Exercise per Session: Not on file  Stress:   . Feeling of Stress : Not on file  Social Connections:   . Frequency of Communication with Friends and Family: Not on file  . Frequency of Social Gatherings with Friends and Family: Not on file  . Attends Religious Services: Not on file  . Active Member of Clubs or Organizations: Not on file  . Attends Archivist Meetings: Not on file  . Marital Status: Not on file  Intimate Partner Violence:   . Fear of Current or Ex-Partner: Not on file  . Emotionally Abused: Not on file  . Physically Abused: Not on file  . Sexually Abused: Not on file   ROS  Review of Systems  Constitution: Positive for malaise/fatigue. Negative for chills, decreased appetite and weight gain.  Cardiovascular: Positive for dyspnea on  exertion. Negative for leg swelling, orthopnea and syncope.  Endocrine: Negative for cold intolerance.  Hematologic/Lymphatic: Does not bruise/bleed easily.  Musculoskeletal: Positive for back pain, joint pain (bilateral knee and also hips) and muscle cramps (night cramps). Negative for joint swelling.  Gastrointestinal: Positive for heartburn. Negative for abdominal pain, anorexia, change in bowel habit, hematochezia and melena.  Neurological: Positive for tremors. Negative for headaches and light-headedness.  Psychiatric/Behavioral: Negative for depression and substance abuse.  All other systems reviewed and are negative.  Objective   Vitals with BMI 08/25/2019 07/24/2019 07/08/2019    Height '4\' 11"'  '4\' 11"'  -  Weight 156 lbs 157 lbs 14 oz -  BMI 62.70 35.00 -  Systolic 938 182 993  Diastolic 77 82 55  Pulse 83 98 63    Blood pressure (!) 168/77, pulse 83, temperature 98.2 F (36.8 C), height '4\' 11"'  (1.499 m), weight 156 lb (70.8 kg), SpO2 96 %. Body mass index is 31.51 kg/m.   Physical Exam  Constitutional:  Short stature and mildly obese in no acute distress.  HENT:  Head: Atraumatic.  Eyes: Conjunctivae are normal.  Neck: No JVD present. No thyromegaly present.  Cardiovascular: Normal rate, normal heart sounds and intact distal pulses. An irregularly irregular rhythm present. Exam reveals no gallop, no S3 and no S4.  No murmur heard. Pulses:      Carotid pulses are 2+ on the right side and 2+ on the left side.      Radial pulses are 2+ on the right side and 2+ on the left side.       Femoral pulses are 2+ on the right side and 2+ on the left side.      Popliteal pulses are 2+ on the right side and 1+ on the left side.       Dorsalis pedis pulses are 2+ on the right side and 1+ on the left side.       Posterior tibial pulses are 2+ on the right side and 1+ on the left side.  S1 is variable, S2 is normal. No edema. No JVD.    Pulmonary/Chest: Effort normal and breath sounds normal.  Abdominal: Soft. Bowel sounds are normal.  Obese abdomen  Musculoskeletal:        General: No edema. Normal range of motion.     Cervical back: Neck supple.  Neurological: She is alert.  Skin: Skin is warm and dry.  Psychiatric: She has a normal mood and affect.   Radiology: No results found.  Laboratory examination:   Labs 04/28/2019: EGFR 82 mL, BUN 21, creatinine 0.81, potassium 4.9.  HB 4.6/HCT 40.6, platelets 09/15/59.  Serum cholesterol 141 mg, A1c 8.0%.  Total cholesterol 186, triglycerides 129, HDL 37, LDL 123.    Labs 11/17/2017: Glucose 213.  BUN/creatinine 14/0.66.  EGFR 97.  Sodium 140, potassium 4.4 H/H 13.9/43.1.  MCV 89.6.  Platelets 260. Hemoglobin A1c  8.4% Cholesterol 128, HDL 35, LDL 70, triglycerides 113  Recent Labs    06/24/19 1042 07/08/19 1302  NA 139 138  K 4.7 4.5  CL 98 105  CO2 24  --   GLUCOSE 178* 87  BUN 13 15  CREATININE 0.76 0.50  CALCIUM 9.4  --   GFRNONAA 76  --   GFRAA 88  --    CMP Latest Ref Rng & Units 07/08/2019 06/24/2019  Glucose 70 - 99 mg/dL 87 178(H)  BUN 8 - 23 mg/dL 15 13  Creatinine 0.44 - 1.00 mg/dL 0.50 0.76  Sodium 135 - 145 mmol/L 138 139  Potassium 3.5 - 5.1 mmol/L 4.5 4.7  Chloride 98 - 111 mmol/L 105 98  CO2 20 - 29 mmol/L - 24  Calcium 8.7 - 10.3 mg/dL - 9.4   CBC Latest Ref Rng & Units 07/08/2019 06/24/2019  WBC 3.4 - 10.8 x10E3/uL - 6.2  Hemoglobin 12.0 - 15.0 g/dL 13.3 13.1  Hematocrit 36.0 - 46.0 % 39.0 41.5  Platelets 150 - 450 x10E3/uL - 181   Lipid Panel  No results found for: CHOL, TRIG, HDL, CHOLHDL, VLDL, LDLCALC, LDLDIRECT HEMOGLOBIN A1C No results found for: HGBA1C, MPG TSH No results for input(s): TSH in the last 8760 hours. Medications   Allergies  Allergen Reactions  . Codeine Nausea And Vomiting  . Penicillins Anaphylaxis    Did it involve swelling of the face/tongue/throat, SOB, or low BP? Yes Did it involve sudden or severe rash/hives, skin peeling, or any reaction on the inside of your mouth or nose? Unknown Did you need to seek medical attention at a hospital or doctor's office? Yes--inpatient when reaction occurred When did it last happen?1964 or 1965 If all above answers are "NO", may proceed with cephalosporin use.   . Tramadol Itching     Prior to Admission medications   Medication Sig Start Date End Date Taking? Authorizing Provider  amLODipine (NORVASC) 5 MG tablet Take 5 mg by mouth daily. 05/10/18  Yes [provider]  Apoaequorin (PREVAGEN PO) Take 1 capsule by mouth daily at 2 PM.   Yes [provider]  aspirin 325 MG tablet Take 325 mg by mouth daily.   Yes [provider]  carvedilol (COREG) 3.125 MG  tablet Take 3.125 mg by mouth daily at 2 PM.  04/18/18  Yes [provider]  ezetimibe (ZETIA) 10 MG tablet Take 1 tablet by mouth daily at 2 PM. 02/24/19  Yes [provider]  glimepiride (AMARYL) 4 MG tablet TAKE 1 TABLET BY MOUTH TWICE DAILY WITH BREAKFAST OR THE FIRST MAIN MEAL OF THE DAY 05/08/18  Yes [provider]  hydrochlorothiazide (MICROZIDE) 12.5 MG capsule Take 12.5 mg by mouth daily. 12/14/14  Yes [provider]  losartan (COZAAR) 100 MG tablet TAKE ONE TABLET BY MOUTH EVERY DAY 30 03/06/15  Yes [provider]  metFORMIN (GLUCOPHAGE) 1000 MG tablet TAKE ONE TABLET BY MOUTH TWICE A DAY ORALLY 02/26/15  Yes [provider]  TRULICITY 1.5 XT/0.2IO SOPN  05/29/18  Yes [provider]     Current Outpatient Medications  Medication Instructions  . amiodarone (PACERONE) 200 mg, Oral, 2 times daily  . amLODipine (NORVASC) 5 mg, Oral, Daily  . apixaban (ELIQUIS) 5 mg, Oral, 2 times daily  . carvedilol (COREG) 3.125 mg, Oral, 2 times daily  . glimepiride (AMARYL) 4 mg, Oral, 2 times daily  . hydrochlorothiazide (HYDRODIURIL) 12.5 mg, Oral, Daily  . hydrocortisone cream 1 % Topical, 3 times daily  . losartan (COZAAR) 100 mg, Oral, Daily  . Magnesium-Potassium 250-100 MG TABS 1 tablet, Oral, 2 times daily  . metFORMIN (GLUCOPHAGE) 1,000 mg, Oral, 2 times daily  . multivitamin-lutein (OCUVITE-LUTEIN) CAPS capsule 1 capsule, Oral, Daily  . Omega-3 Fatty Acids (FISH OIL PO) 1 capsule, Oral, Daily  . Tresiba 5 Units, Subcutaneous, Daily  . Trulicity 1.5 mg, Subcutaneous, Every Fri    Cardiac Studies:   Carotid artery duplex 04-16-2016: Mild plaque, Heterogeneous.No  Hemodynamically significant stenosis.  Right ICA less than 50% stenosis.  Bilateral antegrade vertebral flow. Solid  and cystic thyroid nodules present bilaterally.  Lexiscan Myoview Stress Test 06/09/2019: Resting EKG atrial fibrillation, low voltage, nonspecific T  abnormality.  Stress EKG no change.  Stress symptoms included neck pain, dyspnea, dizziness, & stomach pain.  Myocardial perfusion imaging is normal. Left ventricular ejection fraction is  63% with normal wall motion. Low risk study. No previous exam available for comparison.  Echocardiogram 05/26/2019: Left ventricle cavity is normal in size. Mild concentric hypertrophy of the left ventricle. Normal LV systolic function with EF 55%. Normal global wall motion. Unable to evaluate diastolic function due to atrial fibrillation.  Left atrial cavity is moderately dilated. Likely underestimated by volumetric assessment.  Trileaflet aortic valve. Trace aortic regurgitation. Moderate (Grade III) mitral regurgitation. Mild tricuspid regurgitation. Estimated pulmonary artery systolic pressure is 35 mmHg.  IVC is dilated with blunted respiratory response. Estimated RA pressure 10-15 mmHg. Compared to previous study in 2012, mitral regurgitation, left atrial dilatation increased mild ot moderate. 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   Direct current cardioversion 07/08/2019:  Indication symptomatic A. Fibrillation.  Procedure: Using 100 mg of IV Propofol and 60 IV Lidocaine (for reducing venous pain) for achieving deep sedation, synchronized direct current cardioversion performed. Patient was delivered with 120 x1, 150x1, 200 Joules of electricity X 3 with success to NSR. Patient tolerated the procedure well. No immediate complication noted.    Assessment     ICD-10-CM   1. Paroxysmal atrial fibrillation (HCC) CHA2DS2-VASc Score is 5.  Yearly risk of stroke: 6.7%.( A, S, HTN, Vasc Dz)    I48.0 EKG 12-Lead  2. Shortness of breath  R06.02   3. Essential hypertension  I10     EKG:08/25/2019: Atrial fibrillation with controlled ventricular response at rate of 76 bpm, normal axis, anteroseptal infarct old.  Nonspecific T abnormality. No significant change from  EKG 06/18/2019 and EKG 05/15/2019     Recommendations:   ALLIZON WOZNICK  is a 79 y.o. Caucasian female with uncontrolled type II diabetes mellitus, hyperlipidemia, hypertension, Symptomatic atrial fibrillation with dyspnea and also fatigue, underwent direct current cardioversion on 07/08/2019 to sinus rhythm; however, again developed recurrence of A fib. She is now on Amiodarone and was scheduled for cardioversion, but decided to cancel this due to the holidays..  She now presents here for follow-up.  She is tolerating amiodarone and other medications well. She continues to have dyspnea on exertion. She continues to be in atrial fibrillation that is much better rate controlled with amiodarone. As she does continue to have symptoms, will reschedule cardioversion. Schedule for Direct current cardioversion. I have discussed regarding risks benefits rate control vs rhythm control with the patient. Patient understands cardiac arrest and need for CPR, aspiration pneumonia, but not limited to these. Patient is willing.  Continue with Amiodarone for now. No bleeding diathesis.   Blood pressure was initially elevated, but on recheck had improved. Will continue to monitor. We will see her back after her cardioversion.    *I have discussed this case with Dr. Einar Gip and he personally examined the patient and participated in formulating the plan.*  Miquel Dunn, MSN, APRN, FNP-C [facility]. [place] Office: [phone] Fax: [phone]

## 2019-08-29 ENCOUNTER — Other Ambulatory Visit (HOSPITAL_COMMUNITY)
Admission: RE | Admit: 2019-08-29 | Discharge: 2019-08-29 | Disposition: A | Discharge status: Home / Self Care | Payer: Medicare Other | Source: Ambulatory Visit | Attending: Cardiology | Admitting: Cardiology

## 2019-08-29 DIAGNOSIS — Z20822 Contact with and (suspected) exposure to covid-19: Secondary | ICD-10-CM | POA: Insufficient documentation

## 2019-08-29 DIAGNOSIS — Z01812 Encounter for preprocedural laboratory examination: Secondary | ICD-10-CM | POA: Diagnosis not present

## 2019-08-30 LAB — NOVEL CORONAVIRUS, NAA (HOSP ORDER, SEND-OUT TO REF LAB; TAT 18-24 HRS): SARS-CoV-2, NAA: NOT DETECTED

## 2019-09-01 NOTE — Progress Notes (Signed)
Pre op call done. Patient states they have remained quarantined since COVID test. She states she has not missed any doses of her blood thinner and will take morning of procedure. Patient to arrive by 0930 am 09/02/19. All questions addressed.

## 2019-09-02 ENCOUNTER — Ambulatory Visit (HOSPITAL_COMMUNITY): Payer: Medicare Other | Admitting: Anesthesiology

## 2019-09-02 ENCOUNTER — Other Ambulatory Visit: Payer: Self-pay

## 2019-09-02 ENCOUNTER — Encounter (HOSPITAL_COMMUNITY): Payer: Self-pay | Admitting: Cardiology

## 2019-09-02 ENCOUNTER — Ambulatory Visit (HOSPITAL_COMMUNITY)
Admission: RE | Admit: 2019-09-02 | Discharge: 2019-09-02 | Disposition: A | Discharge status: Home / Self Care | Payer: Medicare Other | Attending: Cardiology | Admitting: Cardiology

## 2019-09-02 ENCOUNTER — Encounter (HOSPITAL_COMMUNITY)
Admission: RE | Disposition: A | Discharge status: Home / Self Care | Payer: Self-pay | Source: Home / Self Care | Attending: Cardiology

## 2019-09-02 DIAGNOSIS — E1165 Type 2 diabetes mellitus with hyperglycemia: Secondary | ICD-10-CM | POA: Diagnosis not present

## 2019-09-02 DIAGNOSIS — Z888 Allergy status to other drugs, medicaments and biological substances status: Secondary | ICD-10-CM | POA: Insufficient documentation

## 2019-09-02 DIAGNOSIS — Z87891 Personal history of nicotine dependence: Secondary | ICD-10-CM | POA: Insufficient documentation

## 2019-09-02 DIAGNOSIS — Z794 Long term (current) use of insulin: Secondary | ICD-10-CM | POA: Diagnosis not present

## 2019-09-02 DIAGNOSIS — Z885 Allergy status to narcotic agent status: Secondary | ICD-10-CM | POA: Diagnosis not present

## 2019-09-02 DIAGNOSIS — Z79899 Other long term (current) drug therapy: Secondary | ICD-10-CM | POA: Diagnosis not present

## 2019-09-02 DIAGNOSIS — Z7901 Long term (current) use of anticoagulants: Secondary | ICD-10-CM | POA: Insufficient documentation

## 2019-09-02 DIAGNOSIS — I480 Paroxysmal atrial fibrillation: Secondary | ICD-10-CM | POA: Diagnosis not present

## 2019-09-02 DIAGNOSIS — I252 Old myocardial infarction: Secondary | ICD-10-CM | POA: Diagnosis not present

## 2019-09-02 DIAGNOSIS — E785 Hyperlipidemia, unspecified: Secondary | ICD-10-CM | POA: Diagnosis not present

## 2019-09-02 DIAGNOSIS — R0602 Shortness of breath: Secondary | ICD-10-CM | POA: Insufficient documentation

## 2019-09-02 DIAGNOSIS — Z7982 Long term (current) use of aspirin: Secondary | ICD-10-CM | POA: Insufficient documentation

## 2019-09-02 DIAGNOSIS — E119 Type 2 diabetes mellitus without complications: Secondary | ICD-10-CM | POA: Diagnosis not present

## 2019-09-02 DIAGNOSIS — I4891 Unspecified atrial fibrillation: Secondary | ICD-10-CM | POA: Diagnosis not present

## 2019-09-02 DIAGNOSIS — I10 Essential (primary) hypertension: Secondary | ICD-10-CM | POA: Diagnosis not present

## 2019-09-02 DIAGNOSIS — Z880 Allergy status to penicillin: Secondary | ICD-10-CM | POA: Insufficient documentation

## 2019-09-02 HISTORY — PX: CARDIOVERSION: SHX1299

## 2019-09-02 LAB — POCT I-STAT, CHEM 8
BUN: 18 mg/dL (ref 8–23)
Calcium, Ion: 1.14 mmol/L — ABNORMAL LOW (ref 1.15–1.40)
Chloride: 102 mmol/L (ref 98–111)
Creatinine, Ser: 0.7 mg/dL (ref 0.44–1.00)
Glucose, Bld: 79 mg/dL (ref 70–99)
HCT: 43 % (ref 36.0–46.0)
Hemoglobin: 14.6 g/dL (ref 12.0–15.0)
Potassium: 3.9 mmol/L (ref 3.5–5.1)
Sodium: 140 mmol/L (ref 135–145)
TCO2: 27 mmol/L (ref 22–32)

## 2019-09-02 SURGERY — CARDIOVERSION
Anesthesia: General

## 2019-09-02 MED ORDER — LIDOCAINE 2% (20 MG/ML) 5 ML SYRINGE
INTRAMUSCULAR | Status: DC | PRN
  Administered 2019-09-02: 60 mg via INTRAVENOUS

## 2019-09-02 MED ORDER — ONDANSETRON HCL 4 MG/2ML IJ SOLN: 4.0000 mg | Freq: Once | INTRAMUSCULAR | Status: DC | PRN

## 2019-09-02 MED ORDER — PROPOFOL 10 MG/ML IV BOLUS
INTRAVENOUS | Status: DC | PRN
  Administered 2019-09-02: 50 mg via INTRAVENOUS

## 2019-09-02 MED ORDER — FENTANYL CITRATE (PF) 100 MCG/2ML IJ SOLN: 25.0000 ug | INTRAMUSCULAR | Status: DC | PRN

## 2019-09-02 MED ORDER — AMIODARONE HCL 200 MG PO TABS: 200.0000 mg | ORAL_TABLET | Freq: Every day | ORAL | 3 refills | Status: DC

## 2019-09-02 NOTE — Interval H&P Note (Signed)
History and Physical Interval Note:  09/02/2019 8:27 AM  Virginia Gilbert  has presented today for surgery, with the diagnosis of AFIB.  The various methods of treatment have been discussed with the patient and family. After consideration of risks, benefits and other options for treatment, the patient has consented to  Procedure(s): CARDIOVERSION (N/A) as a surgical intervention.  The patient's history has been reviewed, patient examined, no change in status, stable for surgery.  I have reviewed the patient's chart and labs.  Questions were answered to the patient's satisfaction.     Yates Decamp

## 2019-09-02 NOTE — Transfer of Care (Signed)
Immediate Anesthesia Transfer of Care Note  Patient: Virginia Gilbert  Procedure(s) Performed: CARDIOVERSION (N/A )  Patient Location: Endoscopy Unit  Anesthesia Type:General  Level of Consciousness: drowsy  Airway & Oxygen Therapy: Patient Spontanous Breathing  Post-op Assessment: Report given to RN and Post -op Vital signs reviewed and stable  Post vital signs: Reviewed and stable  Last Vitals:  Vitals Value Taken Time  BP    Temp    Pulse    Resp    SpO2      Last Pain:  Vitals:   09/02/19 0741  TempSrc: Temporal  PainSc: 0-No pain         Complications: No apparent anesthesia complications

## 2019-09-02 NOTE — CV Procedure (Signed)
Direct current cardioversion:  Indication symptomatic A. Fibrillation.  Procedure: Using 50 mg of IV Propofol and 60 IV Lidocaine (for reducing venous pain) for achieving deep sedation, synchronized direct current cardioversion performed. Patient was delivered with 120 Joules of electricity X 1 with success to NSR. Patient tolerated the procedure well. No immediate complication noted.  Yates Decamp, MD, [facility] 09/02/2019, 8:34 AM [facility]. [place] Office: [phone]

## 2019-09-02 NOTE — Anesthesia Postprocedure Evaluation (Signed)
Anesthesia Post Note  Patient: Virginia Gilbert  Procedure(s) Performed: CARDIOVERSION (N/A )     Patient location during evaluation: PACU Anesthesia Type: General Level of consciousness: awake and alert Pain management: pain level controlled Vital Signs Assessment: post-procedure vital signs reviewed and stable Respiratory status: spontaneous breathing, nonlabored ventilation and respiratory function stable Cardiovascular status: blood pressure returned to baseline and stable Postop Assessment: no apparent nausea or vomiting Anesthetic complications: no    Last Vitals:  Vitals:   09/02/19 0846 09/02/19 0856  BP: (!) 120/40 (!) 142/54  Pulse: 60 63  Resp: 17 16  Temp:    SpO2: 92% 97%    Last Pain:  Vitals:   09/02/19 0856  TempSrc:   PainSc: 0-No pain                 Cecile Hearing

## 2019-09-02 NOTE — Anesthesia Preprocedure Evaluation (Addendum)
Anesthesia Evaluation  Patient identified by MRN, date of birth, ID band Patient awake    Reviewed: Allergy & Precautions, NPO status , Patient's Chart, lab work & pertinent test results, reviewed documented beta blocker date and time   History of Anesthesia Complications Negative for: history of anesthetic complications  Airway Mallampati: III  TM Distance: >3 FB Neck ROM: Full    Dental  (+) Dental Advisory Given, Lower Dentures, Upper Dentures   Pulmonary former smoker,    Pulmonary exam normal breath sounds clear to auscultation       Cardiovascular hypertension, Pt. on medications and Pt. on home beta blockers + dysrhythmias Atrial Fibrillation  Rhythm:Irregular Rate:Abnormal     Neuro/Psych negative neurological ROS  negative psych ROS   GI/Hepatic negative GI ROS, Neg liver ROS,   Endo/Other  diabetes, Type 2, Oral Hypoglycemic AgentsObesity   Renal/GU negative Renal ROS     Musculoskeletal negative musculoskeletal ROS (+)   Abdominal   Peds  Hematology  (+) Blood dyscrasia (Eliquis), ,   Anesthesia Other Findings Day of surgery medications reviewed with the patient.  Reproductive/Obstetrics                            Anesthesia Physical Anesthesia Plan  ASA: III  Anesthesia Plan: General   Post-op Pain Management:    Induction: Intravenous  PONV Risk Score and Plan: 3 and Treatment may vary due to age or medical condition  Airway Management Planned: Mask  Additional Equipment:   Intra-op Plan:   Post-operative Plan:   Informed Consent: I have reviewed the patients History and Physical, chart, labs and discussed the procedure including the risks, benefits and alternatives for the proposed anesthesia with the patient or authorized representative who has indicated his/her understanding and acceptance.     Dental advisory given  Plan Discussed with:  CRNA  Anesthesia Plan Comments:         Anesthesia Quick Evaluation

## 2019-09-11 ENCOUNTER — Other Ambulatory Visit: Payer: Self-pay

## 2019-09-11 ENCOUNTER — Encounter: Payer: Self-pay | Admitting: Cardiology

## 2019-09-11 ENCOUNTER — Ambulatory Visit: Payer: Medicare Other | Admitting: Cardiology

## 2019-09-11 VITALS — BP 144/74 | HR 81 | Temp 97.2°F | Resp 18 | Ht 60.0 in | Wt 156.6 lb

## 2019-09-11 DIAGNOSIS — R0602 Shortness of breath: Secondary | ICD-10-CM

## 2019-09-11 DIAGNOSIS — I480 Paroxysmal atrial fibrillation: Secondary | ICD-10-CM

## 2019-09-11 DIAGNOSIS — I10 Essential (primary) hypertension: Secondary | ICD-10-CM

## 2019-09-11 MED ORDER — AMIODARONE HCL 200 MG PO TABS: 200.0000 mg | ORAL_TABLET | Freq: Two times a day (BID) | ORAL | 3 refills | Status: DC

## 2019-09-11 NOTE — Progress Notes (Signed)
Primary Physician/Referring:  Jani Gravel, MD  Patient ID: Virginia Gilbert, female    DOB: March 16, 1941, 79 y.o.   MRN: [id number]  Chief Complaint  Patient presents with  . Cardioversion    Follow up  . Paroxysmal Atrial Fibrillation   HPI:    Virginia Gilbert  is a 79 y.o. Caucasian female with uncontrolled type II diabetes mellitus, hyperlipidemia, hypertension, Symptomatic atrial fibrillation with dyspnea and also fatigue, underwent direct current cardioversion on 07/08/2019 to sinus rhythm; however, was found to be back in A fib and was rescheduled for cardioversion on 09/02/2019 with Amiodarone on board which was successful with 1 shock.    She is tolerating medications well. Continues to have dyspnea on exertion and marked fatigue that started 4 days after she had cardioversion, felt well for a few days.  Thought that she had gone back into atrial fibrillation.  No dizziness/lightheadedness. No heart racing.   Past Medical History:  Diagnosis Date  . A-fib ([place])   . Diabetes mellitus without complication ([place])   . HTN (hypertension)   . Hyperlipidemia   . Hypertension    Past Surgical History:  Procedure Laterality Date  . CARDIOVERSION N/A 07/08/2019   Procedure: CARDIOVERSION;  Surgeon: Adrian Prows, MD;  Location: [facility] ENDOSCOPY;  Service: Cardiovascular;  Laterality: N/A;  . CARDIOVERSION N/A 09/02/2019   Procedure: CARDIOVERSION;  Surgeon: Adrian Prows, MD;  Location: [facility];  Service: Cardiovascular;  Laterality: N/A;  . PARTIAL HYSTERECTOMY     1977   Social History   Socioeconomic History  . Marital status: Widowed    Spouse name: Not on file  . Number of children: 3  . Years of education: Not on file  . Highest education level: Not on file  Occupational History  . Not on file  Tobacco Use  . Smoking status: Former Smoker    Packs/day: 0.50    Years: 15.00    Pack years: 7.50    Types: Cigarettes    Quit date: 05/14/1985    Years since quitting: 34.3  .  Smokeless tobacco: Never Used  Substance and Sexual Activity  . Alcohol use: No    Alcohol/week: 0.0 standard drinks  . Drug use: Never  . Sexual activity: Not on file  Other Topics Concern  . Not on file  Social History Narrative  . Not on file   Social Determinants of Health   Financial Resource Strain:   . Difficulty of Paying Living Expenses: Not on file  Food Insecurity:   . Worried About Charity fundraiser in the Last Year: Not on file  . Ran Out of Food in the Last Year: Not on file  Transportation Needs:   . Lack of Transportation (Medical): Not on file  . Lack of Transportation (Non-Medical): Not on file  Physical Activity:   . Days of Exercise per Week: Not on file  . Minutes of Exercise per Session: Not on file  Stress:   . Feeling of Stress : Not on file  Social Connections:   . Frequency of Communication with Friends and Family: Not on file  . Frequency of Social Gatherings with Friends and Family: Not on file  . Attends Religious Services: Not on file  . Active Member of Clubs or Organizations: Not on file  . Attends Archivist Meetings: Not on file  . Marital Status: Not on file  Intimate Partner Violence:   . Fear of Current or Ex-Partner: Not on file  .  Emotionally Abused: Not on file  . Physically Abused: Not on file  . Sexually Abused: Not on file   ROS  Review of Systems  Constitution: Positive for malaise/fatigue. Negative for weight gain.  Cardiovascular: Positive for dyspnea on exertion. Negative for leg swelling and syncope.  Respiratory: Negative for hemoptysis.   Endocrine: Negative for cold intolerance.  Hematologic/Lymphatic: Does not bruise/bleed easily.  Musculoskeletal: Positive for back pain and joint pain.  Gastrointestinal: Positive for heartburn. Negative for hematochezia and melena.  Neurological: Positive for tremors. Negative for headaches and light-headedness.   Objective   Vitals with BMI 09/11/2019 09/02/2019  09/02/2019  Height '5\' 0"'  - -  Weight 156 lbs 10 oz - -  BMI 88.89 - -  Systolic 169 450 388  Diastolic 74 54 40  Pulse 81 63 60    Blood pressure (!) 144/74, pulse 81, temperature (!) 97.2 F (36.2 C), temperature source Temporal, resp. rate 18, height 5' (1.524 m), weight 156 lb 9.6 oz (71 kg), SpO2 95 %. Body mass index is 30.58 kg/m.   Physical Exam  Constitutional:  Short stature and mildly obese in no acute distress.  HENT:  Head: Atraumatic.  Eyes: Conjunctivae are normal.  Neck: No JVD present. No thyromegaly present.  Cardiovascular: Normal rate and intact distal pulses. An irregularly irregular rhythm present. Exam reveals no gallop, no S3 and no S4.  No murmur heard. Pulses:      Carotid pulses are 2+ on the right side and 2+ on the left side.      Radial pulses are 2+ on the right side and 2+ on the left side.       Femoral pulses are 2+ on the right side and 2+ on the left side.      Popliteal pulses are 2+ on the right side and 1+ on the left side.       Dorsalis pedis pulses are 2+ on the right side and 1+ on the left side.       Posterior tibial pulses are 2+ on the right side and 1+ on the left side.  S1 is variable, S2 is normal. No edema. No JVD.   Pulmonary/Chest: Effort normal and breath sounds normal.  Abdominal: Soft. Bowel sounds are normal.  Obese abdomen  Musculoskeletal:        General: No edema. Normal range of motion.     Cervical back: Neck supple.  Neurological: She is alert.  Skin: Skin is warm and dry.  Psychiatric: She has a normal mood and affect.   Radiology: No results found.  Laboratory examination:   Labs 04/28/2019: EGFR 82 mL, BUN 21, creatinine 0.81, potassium 4.9.  HB 4.6/HCT 40.6, platelets 09/15/59.  Serum cholesterol 141 mg, A1c 8.0%.  Total cholesterol 186, triglycerides 129, HDL 37, LDL 123.    Labs 11/17/2017: Glucose 213.  BUN/creatinine 14/0.66.  EGFR 97.  Sodium 140, potassium 4.4 H/H 13.9/43.1.  MCV 89.6.  Platelets  260. Hemoglobin A1c 8.4% Cholesterol 128, HDL 35, LDL 70, triglycerides 113  Recent Labs    06/24/19 1042 07/08/19 1302 09/02/19 0757  NA 139 138 140  K 4.7 4.5 3.9  CL 98 105 102  CO2 24  --   --   GLUCOSE 178* 87 79  BUN '13 15 18  ' CREATININE 0.76 0.50 0.70  CALCIUM 9.4  --   --   GFRNONAA 76  --   --   GFRAA 88  --   --  CMP Latest Ref Rng & Units 09/02/2019 07/08/2019 06/24/2019  Glucose 70 - 99 mg/dL 79 87 178(H)  BUN 8 - 23 mg/dL '18 15 13  ' Creatinine 0.44 - 1.00 mg/dL 0.70 0.50 0.76  Sodium 135 - 145 mmol/L 140 138 139  Potassium 3.5 - 5.1 mmol/L 3.9 4.5 4.7  Chloride 98 - 111 mmol/L 102 105 98  CO2 20 - 29 mmol/L - - 24  Calcium 8.7 - 10.3 mg/dL - - 9.4   CBC Latest Ref Rng & Units 09/02/2019 07/08/2019 06/24/2019  WBC 3.4 - 10.8 x10E3/uL - - 6.2  Hemoglobin 12.0 - 15.0 g/dL 14.6 13.3 13.1  Hematocrit 36.0 - 46.0 % 43.0 39.0 41.5  Platelets 150 - 450 x10E3/uL - - 181   Lipid Panel  No results found for: CHOL, TRIG, HDL, CHOLHDL, VLDL, LDLCALC, LDLDIRECT HEMOGLOBIN A1C No results found for: HGBA1C, MPG TSH No results for input(s): TSH in the last 8760 hours. Medications   Allergies  Allergen Reactions  . Codeine Nausea And Vomiting  . Penicillins Anaphylaxis    Did it involve swelling of the face/tongue/throat, SOB, or low BP? Yes Did it involve sudden or severe rash/hives, skin peeling, or any reaction on the inside of your mouth or nose? Unknown Did you need to seek medical attention at a hospital or doctor's office? Yes--inpatient when reaction occurred When did it last happen?1964 or 1965 If all above answers are "NO", may proceed with cephalosporin use.   . Tramadol Itching     Current Outpatient Medications  Medication Instructions  . amiodarone (PACERONE) 200 mg, Oral, 2 times daily  . amLODipine (NORVASC) 5 mg, Oral, Daily  . apixaban (ELIQUIS) 5 mg, Oral, 2 times daily  . Apoaequorin (PREVAGEN PO) 1 tablet, Oral, Daily with lunch  .  beta carotene w/minerals (OCUVITE) tablet 1 tablet, Oral, Daily  . carvedilol (COREG) 3.125 mg, Oral, 2 times daily  . glimepiride (AMARYL) 4 mg, Oral, 2 times daily  . hydrochlorothiazide (HYDRODIURIL) 12.5 mg, Oral, Daily  . hydrocortisone cream 1 % Topical, 3 times daily  . losartan (COZAAR) 100 mg, Oral, Daily  . metFORMIN (GLUCOPHAGE) 1,000 mg, Oral, 2 times daily  . Omega-3 Fatty Acids (FISH OIL PO) 1 capsule, Oral, Daily  . Tresiba 5 Units, Subcutaneous, Daily  . Trulicity 1.5 mg, Subcutaneous, Every Fri  . Vitamin B-12 2,500 mcg, Sublingual, Daily with lunch  . Vitamin D-3 5,000 Units, Oral, Daily after lunch    Cardiac Studies:   Carotid artery duplex 2016/05/11: Mild plaque, Heterogeneous.No  Hemodynamically significant stenosis.  Right ICA less than 50% stenosis.  Bilateral antegrade vertebral flow. 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  Lexiscan Myoview Stress Test 06/09/2019: Resting EKG atrial fibrillation, low voltage, nonspecific T abnormality.  Stress EKG no change.  Stress symptoms included neck pain, dyspnea, dizziness, & stomach pain.  Myocardial perfusion imaging is normal. Left ventricular ejection fraction is  63% with normal wall motion. Low risk study. No previous exam available for comparison.  Echocardiogram 05/26/2019: Left ventricle cavity is normal in size. Mild concentric hypertrophy of the left ventricle. Normal LV systolic function with EF 55%. Normal global wall motion. Unable to evaluate diastolic function due to atrial fibrillation.  Left atrial cavity is moderately dilated. Likely underestimated by volumetric assessment.  Trileaflet aortic valve. Trace aortic regurgitation. Moderate (Grade III) mitral regurgitation. Mild tricuspid regurgitation. Estimated pulmonary artery systolic pressure is 35 mmHg.  IVC is dilated with blunted respiratory response. Estimated RA pressure 10-15 mmHg. Compared  to previous study in 2012, mitral  regurgitation, left atrial dilatation increased mild ot moderate. 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   Direct current cardioversion 07/08/2019 120 x1, 150x1, 200 Joules of electricity X 3 with success to NSR Also 09/02/2019 120J x 1 to NSR   Assessment     ICD-10-CM   1. Paroxysmal atrial fibrillation (HCC) CHA2DS2-VASc Score is 5.  Yearly risk of stroke: 6.7%.( A, S, HTN, Vasc Dz)    I48.0 EKG 12-Lead    Pulse oximetry, overnight    Ambulatory referral to Sleep Studies    amiodarone (PACERONE) 200 MG tablet  2. Shortness of breath  R06.02 Pulse oximetry, overnight  3. Essential hypertension  I10   4. Paroxysmal atrial fibrillation (HCC)  I48.0    CHA2DS2-VASc Score is 5.  Yearly risk of stroke: 6.7%.( A, S, HTN, Vasc Dz)      EKG 09/11/2019: Atrial fibrillation with controlled ventricular response at the rate of 69 bpm, left axis deviation, cannot exclude inferior infarct old.  Poor R progression, cannot exclude anterolateral infarct old.  Low-voltage complexes.  Nonspecific T abnormality. No significant change from  EKG:08/25/2019 0.7   Recommendations:   DEANZA UPPERMAN  is a 79 y.o. Caucasian female with uncontrolled type II diabetes mellitus, hyperlipidemia, hypertension, Symptomatic atrial fibrillation with dyspnea and also fatigue, underwent direct current cardioversion on 07/08/2019 to sinus rhythm; however, was found to be back in A fib and was rescheduled for cardioversion on 09/02/2019 with Amiodarone on board which was successful with 1 shock.  She now presents here for follow-up.  Patient maintained sinus rhythm for 4 days and felt extremely well with marked improvement in dyspnea and also fatigue.  I discussed with her regarding medical therapy options including Tikosyn and could also consider atrial fibrillation ablation.  However I would like to exclude sleep apnea, referral made to be evaluated for both central and obstructive sleep apnea.  We will also set her up for  nocturnal oximetry.  Increase the dose of amiodarone to 200 mg p.o. twice daily, will reattempt cardioversion in 2 weeks.  Blood pressure is elevated today but home blood pressure recordings have been stable and prior office visit blood pressure was also stable and she did not make any changes.  I would like to see her back in 6 weeks for follow-up.   Adrian Prows, MD, [facility] 09/11/2019, 12:09 PM [facility] [place] Office: [phone]

## 2019-09-17 ENCOUNTER — Institutional Professional Consult (permissible substitution): Payer: Medicare Other | Admitting: Neurology

## 2019-09-18 ENCOUNTER — Other Ambulatory Visit: Payer: Self-pay

## 2019-09-18 ENCOUNTER — Ambulatory Visit (INDEPENDENT_AMBULATORY_CARE_PROVIDER_SITE_OTHER): Payer: Medicare Other | Admitting: Internal Medicine

## 2019-09-18 ENCOUNTER — Encounter: Payer: Self-pay | Admitting: Internal Medicine

## 2019-09-18 VITALS — BP 132/78 | HR 91 | Temp 98.1°F | Ht 59.0 in | Wt 155.4 lb

## 2019-09-18 DIAGNOSIS — E782 Mixed hyperlipidemia: Secondary | ICD-10-CM

## 2019-09-18 DIAGNOSIS — R0602 Shortness of breath: Secondary | ICD-10-CM | POA: Diagnosis not present

## 2019-09-18 DIAGNOSIS — I10 Essential (primary) hypertension: Secondary | ICD-10-CM | POA: Diagnosis not present

## 2019-09-18 DIAGNOSIS — I4819 Other persistent atrial fibrillation: Secondary | ICD-10-CM

## 2019-09-18 NOTE — Progress Notes (Signed)
Cardiology Office Note:    Date:  09/18/2019   ID:  Virginia Gilbert, DOB December 12, 1940, MRN [id number]  PCP:  Pearson Grippe, MD  Cardiologist:  No primary care provider on file.  Electrophysiologist:  None   Referring MD: Pearson Grippe, MD   Chief Complaint:   History of Present Illness:    Virginia Gilbert is a 79 y.o. female with a hx of atrial fibrillation, diabetes mellitus, hypertension, hyperlipidemia who presents today for second opinion for management of atrial fibrillation recurrence after cardioversion.  She was symptomatic in atrial fibrillation with dyspnea and fatigue and underwent DCCV 07/08/2019 with conversion to sinus rhythm, however had a recurrence of atrial fibrillation and was scheduled for cardioversion on 09/02/2019 after initiating amiodarone.  She had successful cardioversion after 1 shock and was continued on amiodarone.  She converted back to atrial fibrillation, likely 4 days after her cardioversion with recurrence of symptoms of dyspnea on exertion and marked fatigue.  She had a discussion about Tikosyn versus A. fib ablation, as well as risk factor modification including treatment of sleep apnea.  It was recommended that her amiodarone be increased to 200 mg twice daily with repeat cardioversion in mid February.  She presents to our office for review of this plan and discussion of alternative options.  The patient and I had a lengthy discussion about the natural history of atrial fibrillation, and the strategies of rate, rhythm control as well as need for anticoagulation.  We discussed risk factors contributing to atrial fibrillation including untreated sleep apnea.  She denies snoring does not feel that this will be helpful.  We also discussed caffeine and alcohol use as potential triggers, as well as thyroid abnormalities, as well as medical illness as a precipitator.  We also discussed that cardiovascular factors can contribute to atrial fibrillation including valvular heart  disease, diastolic dysfunction. Echocardiogram obtained October 2020 demonstrates normal LV RV function, mild concentric hypertrophy, EF 55%, moderate LA enlargement, reported moderate mitral regurgitation, unclear if quantitation performed.  Borderline elevation of RVSP measured at 35 mmHg, and estimated RA pressure of 10 to 15 mmHg.  The mitral regurgitation was felt to be increased from mild to moderate and LA dilatation progressed.  Elevation in RVSP also felt to be new.  I have discussed these findings with the patient.  I am unable to view these images independently for confirmation of findings.  Past Medical History:  Diagnosis Date  . A-fib (HCC)   . Diabetes mellitus without complication (HCC)   . HTN (hypertension)   . Hyperlipidemia   . Hypertension     Past Surgical History:  Procedure Laterality Date  . CARDIOVERSION N/A 07/08/2019   Procedure: CARDIOVERSION;  Surgeon: Yates Decamp, MD;  Location: [facility] ENDOSCOPY;  Service: Cardiovascular;  Laterality: N/A;  . CARDIOVERSION N/A 09/02/2019   Procedure: CARDIOVERSION;  Surgeon: Yates Decamp, MD;  Location: [facility] ENDOSCOPY;  Service: Cardiovascular;  Laterality: N/A;  . PARTIAL HYSTERECTOMY     1977    Current Medications: Current Meds  Medication Sig  . amiodarone (PACERONE) 200 MG tablet Take 1 tablet (200 mg total) by mouth 2 (two) times daily.  Marland Kitchen amLODipine (NORVASC) 5 MG tablet Take 5 mg by mouth daily.  Marland Kitchen apixaban (ELIQUIS) 5 MG TABS tablet Take 1 tablet (5 mg total) by mouth 2 (two) times daily.  Marland Kitchen Apoaequorin (PREVAGEN PO) Take 1 tablet by mouth daily with lunch.  . beta carotene w/minerals (OCUVITE) tablet Take 1 tablet by mouth daily.  Marland Kitchen  carvedilol (COREG) 3.125 MG tablet Take 3.125 mg by mouth 2 (two) times daily.   . Cholecalciferol (VITAMIN D-3) 125 MCG (5000 UT) TABS Take 5,000 Units by mouth daily after lunch.  . Cyanocobalamin (VITAMIN B-12) 2500 MCG SUBL Place 2,500 mcg under the tongue daily with lunch.  . glimepiride  (AMARYL) 4 MG tablet Take 4 mg by mouth 2 (two) times daily.   . hydrochlorothiazide (HYDRODIURIL) 12.5 MG tablet Take 12.5 mg by mouth daily.  . hydrocortisone cream 1 % Apply topically 3 (three) times daily. (Patient taking differently: Apply 1 application topically 3 (three) times daily as needed (redness/irritation.). )  . losartan (COZAAR) 100 MG tablet Take 100 mg by mouth daily.   . metFORMIN (GLUCOPHAGE) 1000 MG tablet Take 1,000 mg by mouth 2 (two) times daily.   . Omega-3 Fatty Acids (FISH OIL PO) Take 1 capsule by mouth daily.  . TRESIBA 100 UNIT/ML SOLN Inject 5 Units into the skin daily.  . TRULICITY 1.5 MG/0.5ML SOPN Inject 1.5 mg into the skin every Friday.      Allergies:   Codeine, Penicillins, and Tramadol   Social History   Socioeconomic History  . Marital status: Widowed    Spouse name: Not on file  . Number of children: 3  . Years of education: Not on file  . Highest education level: Not on file  Occupational History  . Not on file  Tobacco Use  . Smoking status: Former Smoker    Packs/day: 0.50    Years: 15.00    Pack years: 7.50    Types: Cigarettes    Quit date: 05/14/1985    Years since quitting: 34.3  . Smokeless tobacco: Never Used  Substance and Sexual Activity  . Alcohol use: No    Alcohol/week: 0.0 standard drinks  . Drug use: Never  . Sexual activity: Not on file  Other Topics Concern  . Not on file  Social History Narrative  . Not on file   Social Determinants of Health   Financial Resource Strain:   . Difficulty of Paying Living Expenses: Not on file  Food Insecurity:   . Worried About Programme researcher, broadcasting/film/video in the Last Year: Not on file  . Ran Out of Food in the Last Year: Not on file  Transportation Needs:   . Lack of Transportation (Medical): Not on file  . Lack of Transportation (Non-Medical): Not on file  Physical Activity:   . Days of Exercise per Week: Not on file  . Minutes of Exercise per Session: Not on file  Stress:   .  Feeling of Stress : Not on file  Social Connections:   . Frequency of Communication with Friends and Family: Not on file  . Frequency of Social Gatherings with Friends and Family: Not on file  . Attends Religious Services: Not on file  . Active Member of Clubs or Organizations: Not on file  . Attends Banker Meetings: Not on file  . Marital Status: Not on file     Family History: The patient's family history includes Diabetes in her brother and mother; Emphysema in her brother and father; Heart disease in her brother, brother, and mother.  ROS:   Please see the history of present illness.    All other systems reviewed and are negative.  EKGs/Labs/Other Studies Reviewed:    The following studies were reviewed today:  EKG: Atrial fibrillation, septal infarct age indeterminate, heart rate 91 bpm.  Recent Labs: 06/24/2019: Platelets 181  09/02/2019: BUN 18; Creatinine, Ser 0.70; Hemoglobin 14.6; Potassium 3.9; Sodium 140  Recent Lipid Panel No results found for: CHOL, TRIG, HDL, CHOLHDL, VLDL, LDLCALC, LDLDIRECT  Physical Exam:    VS:  BP 132/78   Pulse 91   Temp 98.1 F (36.7 C)   Ht 4\' 11"  (1.499 m)   Wt 155 lb 6.4 oz (70.5 kg)   SpO2 95%   BMI 31.39 kg/m     Wt Readings from Last 5 Encounters:  09/18/19 155 lb 6.4 oz (70.5 kg)  09/11/19 156 lb 9.6 oz (71 kg)  09/02/19 156 lb (70.8 kg)  08/25/19 156 lb (70.8 kg)  07/24/19 157 lb 14.4 oz (71.6 kg)     Constitutional: No acute distress Eyes: sclera non-icteric, normal conjunctiva and lids ENMT: Nose and mouth covered by mask Cardiovascular: Irregular rhythm, normal rate, no murmurs. S1 and S2 normal. Radial pulses normal bilaterally. No jugular venous distention.  Respiratory: clear to auscultation bilaterally GI : normal bowel sounds, soft and nontender. No distention.   MSK: extremities warm, well perfused. No edema.  NEURO: grossly nonfocal exam, moves all extremities. PSYCH: alert and oriented x 3,  normal mood and affect.      ASSESSMENT:    1. Persistent atrial fibrillation (HCC)   2. Shortness of breath   3. Essential hypertension   4. Mixed hyperlipidemia    PLAN:    Persistent atrial fibrillation-patient had a recurrence of atrial fibrillation after her second cardioversion while on antiarrhythmic therapy with amiodarone.  This in combination with moderately dilated LA, moderate mitral regurgitation may make it more difficult to treat her atrial fibrillation with recurrent cardioversion, however it is reasonable to consider this on alternate antiarrhythmic therapy if the patient is amenable.  Discussion of ablation has been have briefly today, however I feel this would be best accomplished by the A. fib clinic in a coordinated fashion.  I have offered in A. fib clinic appointment to the patient for further information and detailed discussion of her therapeutic options, and she accepts.  I will make that referral.  She continues on apixaban 5 mg twice daily for anticoagulation in A. Fib.  No bleeding complications noted.  This patients CHA2DS2-VASc Score and unadjusted Ischemic Stroke Rate (% per year) is equal to 7.2 % stroke rate/year from a score of 5 Above score calculated as 1 point each if present [CHF, HTN, DM, Vascular=MI/PAD/Aortic Plaque, Age if 65-74, or Female] Above score calculated as 2 points each if present [Age > 75, or Stroke/TIA/TE]  Fatigue and shortness of breath-she had a normal stress Myoview in October 2020, the symptoms are likely secondary to atrial fibrillation.  Hypertension-she continues on amlodipine 5 mg daily carvedilol 3.125 mg twice daily, hydrochlorothiazide 12.5 mg daily, losartan 100 mg daily.  Blood pressure well controlled today, no changes.  Total time of encounter: 45 minutes total time of encounter, including 35 minutes spent in face-to-face patient care. This time includes coordination of care and counseling regarding Afib. Remainder of  non-face-to-face time involved reviewing chart documents/testing relevant to the patient encounter and documentation in the medical record.  10-11-1973, MD [place]  [facility]   Medication Adjustments/Labs and Tests Ordered: Current medicines are reviewed at length with the patient today.  Concerns regarding medicines are outlined above.  Orders Placed This Encounter  Procedures  . Ambulatory referral to Cardiology  . EKG 12-Lead   No orders of the defined types were placed in this encounter.   Patient  Instructions  Medication Instructions:  NO CHANGES CONTINUE WITH CURRENT MEDICAITONS  *If you need a refill on your cardiac medications before your next appointment, please call your pharmacy*  Lab Work: NOT NEEDED  Testing/Procedures: NOT NEEDED  Follow-Up: At [facility], you and your health needs are our priority.  As part of our continuing mission to provide you with exceptional heart care, we have created designated Provider Care Teams.  These Care Teams include your primary Cardiologist (physician) and Advanced Practice Providers (APPs -  Physician Assistants and Nurse Practitioners) who all work together to provide you with the care you need, when you need it.  Your next appointment:   1 month(s)  The format for your next appointment:   In Person  Provider:   Cherlynn Kaiser, MD  Other Instructions You have been referred to [facility]     Atrial Fibrillation  Atrial fibrillation is a type of heartbeat that is irregular or fast. If you have this condition, your heart beats without any order. This makes it hard for your heart to pump blood in a normal way. Atrial fibrillation may come and go, or it may become a long-lasting problem. If this condition is not treated, it can put you at higher risk for stroke, heart failure, and other heart problems. What are the causes? This condition may  be caused by diseases that damage the heart. They include:  High blood pressure.  Heart failure.  Heart valve disease.  Heart surgery. Other causes include:  Diabetes.  Thyroid disease.  Being overweight.  Kidney disease. Sometimes the cause is not known. What increases the risk? You are more likely to develop this condition if:  You are older.  You smoke.  You exercise often and very hard.  You have a family history of this condition.  You are a man.  You use drugs.  You drink a lot of alcohol.  You have lung conditions, such as emphysema, pneumonia, or COPD.  You have sleep apnea. What are the signs or symptoms? Common symptoms of this condition include:  A feeling that your heart is beating very fast.  Chest pain or discomfort.  Feeling short of breath.  Suddenly feeling light-headed or weak.  Getting tired easily during activity.  Fainting.  Sweating. In some cases, there are no symptoms. How is this treated? Treatment for this condition depends on underlying conditions and how you feel when you have atrial fibrillation. They include:  Medicines to: ? Prevent blood clots. ? Treat heart rate or heart rhythm problems.  Using devices, such as a pacemaker, to correct heart rhythm problems.  Doing surgery to remove the part of the heart that sends bad signals.  Closing an area where clots can form in the heart (left atrial appendage). In some cases, your doctor will treat other underlying conditions. Follow these instructions at home: Medicines  Take over-the-counter and prescription medicines only as told by your doctor.  Do not take any new medicines without first talking to your doctor.  If you are taking blood thinners: ? Talk with your doctor before you take any medicines that have aspirin or NSAIDs, such as ibuprofen, in them. ? Take your medicine exactly as told by your doctor. Take it at the same time each day. ? Avoid activities  that could hurt or bruise you. Follow instructions about how to prevent falls. ? Wear a bracelet that says you are taking blood thinners. Or,  carry a card that lists what medicines you take. Lifestyle      Do not use any products that have nicotine or tobacco in them. These include cigarettes, e-cigarettes, and chewing tobacco. If you need help quitting, ask your doctor.  Eat heart-healthy foods. Talk with your doctor about the right eating plan for you.  Exercise regularly as told by your doctor.  Do not drink alcohol.  Lose weight if you are overweight.  Do not use drugs, including cannabis. General instructions  If you have a condition that causes breathing to stop for a short period of time (apnea), treat it as told by your doctor.  Keep a healthy weight. Do not use diet pills unless your doctor says they are safe for you. Diet pills may make heart problems worse.  Keep all follow-up visits as told by your doctor. This is important. Contact a doctor if:  You notice a change in the speed, rhythm, or strength of your heartbeat.  You are taking a blood-thinning medicine and you get more bruising.  You get tired more easily when you move or exercise.  You have a sudden change in weight. Get help right away if:   You have pain in your chest or your belly (abdomen).  You have trouble breathing.  You have side effects of blood thinners, such as blood in your vomit, poop (stool), or pee (urine), or bleeding that cannot stop.  You have any signs of a stroke. "BE FAST" is an easy way to remember the main warning signs: ? B - Balance. Signs are dizziness, sudden trouble walking, or loss of balance. ? E - Eyes. Signs are trouble seeing or a change in how you see. ? F - Face. Signs are sudden weakness or loss of feeling in the face, or the face or eyelid drooping on one side. ? A - Arms. Signs are weakness or loss of feeling in an arm. This happens suddenly and usually on one  side of the body. ? S - Speech. Signs are sudden trouble speaking, slurred speech, or trouble understanding what people say. ? T - Time. Time to call emergency services. Write down what time symptoms started.  You have other signs of a stroke, such as: ? A sudden, very bad headache with no known cause. ? Feeling like you may vomit (nausea). ? Vomiting. ? A seizure. These symptoms may be an emergency. Do not wait to see if the symptoms will go away. Get medical help right away. Call your local emergency services (911 in the [place] Do not drive yourself to the hospital. Summary  Atrial fibrillation is a type of heartbeat that is irregular or fast.  You are at higher risk of this condition if you smoke, are older, have diabetes, or are overweight.  Follow your doctor's instructions about medicines, diet, exercise, and follow-up visits.  Get help right away if you have signs or symptoms of a stroke.  Get help right away if you cannot catch your breath, or you have chest pain or discomfort. This information is not intended to replace advice given to you by your health care provider. Make sure you discuss any questions you have with your health care provider. Document Revised: 01/22/2019 Document Reviewed: 01/22/2019 Elsevier Patient Education  2020 [employer].

## 2019-09-18 NOTE — Patient Instructions (Signed)
Medication Instructions:  NO CHANGES CONTINUE WITH CURRENT MEDICAITONS  *If you need a refill on your cardiac medications before your next appointment, please call your pharmacy*  Lab Work: NOT NEEDED  Testing/Procedures: NOT NEEDED  Follow-Up: At [facility], you and your health needs are our priority.  As part of our continuing mission to provide you with exceptional heart care, we have created designated Provider Care Teams.  These Care Teams include your primary Cardiologist (physician) and Advanced Practice Providers (APPs -  Physician Assistants and Nurse Practitioners) who all work together to provide you with the care you need, when you need it.  Your next appointment:   1 month(s)  The format for your next appointment:   In Person  Provider:   Weston Brass, MD  Other Instructions You have been referred to AFIB  CLINIC - TO DISCUSS OPTIONS WITH ATRIAL FIBRILLATION TREATMENTS     Atrial Fibrillation  Atrial fibrillation is a type of heartbeat that is irregular or fast. If you have this condition, your heart beats without any order. This makes it hard for your heart to pump blood in a normal way. Atrial fibrillation may come and go, or it may become a long-lasting problem. If this condition is not treated, it can put you at higher risk for stroke, heart failure, and other heart problems. What are the causes? This condition may be caused by diseases that damage the heart. They include:  High blood pressure.  Heart failure.  Heart valve disease.  Heart surgery. Other causes include:  Diabetes.  Thyroid disease.  Being overweight.  Kidney disease. Sometimes the cause is not known. What increases the risk? You are more likely to develop this condition if:  You are older.  You smoke.  You exercise often and very hard.  You have a family history of this condition.  You are a man.  You use drugs.  You drink a lot of alcohol.  You have lung  conditions, such as emphysema, pneumonia, or COPD.  You have sleep apnea. What are the signs or symptoms? Common symptoms of this condition include:  A feeling that your heart is beating very fast.  Chest pain or discomfort.  Feeling short of breath.  Suddenly feeling light-headed or weak.  Getting tired easily during activity.  Fainting.  Sweating. In some cases, there are no symptoms. How is this treated? Treatment for this condition depends on underlying conditions and how you feel when you have atrial fibrillation. They include:  Medicines to: ? Prevent blood clots. ? Treat heart rate or heart rhythm problems.  Using devices, such as a pacemaker, to correct heart rhythm problems.  Doing surgery to remove the part of the heart that sends bad signals.  Closing an area where clots can form in the heart (left atrial appendage). In some cases, your doctor will treat other underlying conditions. Follow these instructions at home: Medicines  Take over-the-counter and prescription medicines only as told by your doctor.  Do not take any new medicines without first talking to your doctor.  If you are taking blood thinners: ? Talk with your doctor before you take any medicines that have aspirin or NSAIDs, such as ibuprofen, in them. ? Take your medicine exactly as told by your doctor. Take it at the same time each day. ? Avoid activities that could hurt or bruise you. Follow instructions about how to prevent falls. ? Wear a bracelet that says you are taking blood thinners. Or, carry a card  that lists what medicines you take. Lifestyle      Do not use any products that have nicotine or tobacco in them. These include cigarettes, e-cigarettes, and chewing tobacco. If you need help quitting, ask your doctor.  Eat heart-healthy foods. Talk with your doctor about the right eating plan for you.  Exercise regularly as told by your doctor.  Do not drink alcohol.  Lose weight  if you are overweight.  Do not use drugs, including cannabis. General instructions  If you have a condition that causes breathing to stop for a short period of time (apnea), treat it as told by your doctor.  Keep a healthy weight. Do not use diet pills unless your doctor says they are safe for you. Diet pills may make heart problems worse.  Keep all follow-up visits as told by your doctor. This is important. Contact a doctor if:  You notice a change in the speed, rhythm, or strength of your heartbeat.  You are taking a blood-thinning medicine and you get more bruising.  You get tired more easily when you move or exercise.  You have a sudden change in weight. Get help right away if:   You have pain in your chest or your belly (abdomen).  You have trouble breathing.  You have side effects of blood thinners, such as blood in your vomit, poop (stool), or pee (urine), or bleeding that cannot stop.  You have any signs of a stroke. "BE FAST" is an easy way to remember the main warning signs: ? B - Balance. Signs are dizziness, sudden trouble walking, or loss of balance. ? E - Eyes. Signs are trouble seeing or a change in how you see. ? F - Face. Signs are sudden weakness or loss of feeling in the face, or the face or eyelid drooping on one side. ? A - Arms. Signs are weakness or loss of feeling in an arm. This happens suddenly and usually on one side of the body. ? S - Speech. Signs are sudden trouble speaking, slurred speech, or trouble understanding what people say. ? T - Time. Time to call emergency services. Write down what time symptoms started.  You have other signs of a stroke, such as: ? A sudden, very bad headache with no known cause. ? Feeling like you may vomit (nausea). ? Vomiting. ? A seizure. These symptoms may be an emergency. Do not wait to see if the symptoms will go away. Get medical help right away. Call your local emergency services (911 in the [place] Do not  drive yourself to the hospital. Summary  Atrial fibrillation is a type of heartbeat that is irregular or fast.  You are at higher risk of this condition if you smoke, are older, have diabetes, or are overweight.  Follow your doctor's instructions about medicines, diet, exercise, and follow-up visits.  Get help right away if you have signs or symptoms of a stroke.  Get help right away if you cannot catch your breath, or you have chest pain or discomfort. This information is not intended to replace advice given to you by your health care provider. Make sure you discuss any questions you have with your health care provider. Document Revised: 01/22/2019 Document Reviewed: 01/22/2019 Elsevier Patient Education  [place].

## 2019-09-19 ENCOUNTER — Other Ambulatory Visit (HOSPITAL_COMMUNITY): Payer: Self-pay

## 2019-09-22 ENCOUNTER — Other Ambulatory Visit: Payer: Self-pay

## 2019-09-22 ENCOUNTER — Ambulatory Visit (HOSPITAL_COMMUNITY)
Admission: RE | Admit: 2019-09-22 | Discharge: 2019-09-22 | Disposition: A | Discharge status: Home / Self Care | Payer: Medicare Other | Source: Ambulatory Visit | Attending: Physician Assistant | Admitting: Physician Assistant

## 2019-09-22 ENCOUNTER — Encounter (HOSPITAL_COMMUNITY): Payer: Self-pay | Admitting: Physician Assistant

## 2019-09-22 VITALS — BP 160/90 | HR 74 | Ht 59.0 in | Wt 159.2 lb

## 2019-09-22 DIAGNOSIS — I10 Essential (primary) hypertension: Secondary | ICD-10-CM | POA: Insufficient documentation

## 2019-09-22 DIAGNOSIS — D6869 Other thrombophilia: Secondary | ICD-10-CM | POA: Insufficient documentation

## 2019-09-22 DIAGNOSIS — Z825 Family history of asthma and other chronic lower respiratory diseases: Secondary | ICD-10-CM | POA: Insufficient documentation

## 2019-09-22 DIAGNOSIS — I4819 Other persistent atrial fibrillation: Secondary | ICD-10-CM | POA: Diagnosis not present

## 2019-09-22 DIAGNOSIS — Z833 Family history of diabetes mellitus: Secondary | ICD-10-CM | POA: Insufficient documentation

## 2019-09-22 DIAGNOSIS — E785 Hyperlipidemia, unspecified: Secondary | ICD-10-CM | POA: Diagnosis not present

## 2019-09-22 DIAGNOSIS — Z87891 Personal history of nicotine dependence: Secondary | ICD-10-CM | POA: Insufficient documentation

## 2019-09-22 DIAGNOSIS — E669 Obesity, unspecified: Secondary | ICD-10-CM | POA: Insufficient documentation

## 2019-09-22 DIAGNOSIS — Z7901 Long term (current) use of anticoagulants: Secondary | ICD-10-CM | POA: Insufficient documentation

## 2019-09-22 DIAGNOSIS — Z8249 Family history of ischemic heart disease and other diseases of the circulatory system: Secondary | ICD-10-CM | POA: Diagnosis not present

## 2019-09-22 DIAGNOSIS — Z880 Allergy status to penicillin: Secondary | ICD-10-CM | POA: Insufficient documentation

## 2019-09-22 DIAGNOSIS — Z885 Allergy status to narcotic agent status: Secondary | ICD-10-CM | POA: Insufficient documentation

## 2019-09-22 DIAGNOSIS — Z79899 Other long term (current) drug therapy: Secondary | ICD-10-CM | POA: Insufficient documentation

## 2019-09-22 DIAGNOSIS — Z794 Long term (current) use of insulin: Secondary | ICD-10-CM | POA: Diagnosis not present

## 2019-09-22 DIAGNOSIS — E119 Type 2 diabetes mellitus without complications: Secondary | ICD-10-CM | POA: Insufficient documentation

## 2019-09-22 DIAGNOSIS — Z6832 Body mass index (BMI) 32.0-32.9, adult: Secondary | ICD-10-CM | POA: Insufficient documentation

## 2019-09-22 NOTE — Progress Notes (Signed)
Primary Care Physician: Jani Gravel, MD Primary Cardiologist: Dr Margaretann Loveless Primary Electrophysiologist: none Referring Physician: Dr Jeannie Fend Virginia Gilbert is a 79 y.o. female with a history of DM, HLD, HTN, and persistent atrial fibrillation who presents for consultation in the [facility].  The patient was initially diagnosed with atrial fibrillation 05/2019 after presenting with symptoms of worsening dyspnea on exertion. Patient is on Eliquis for a CHADS2VASC score of 5. There were no specific triggers that the patient could identify. She underwent DCCV on 07/08/19 but had ERAF. She was loaded on amiodarone and had DCCV again on 09/02/19 but unfortunately she was back in afib again. Patient continues to have symptoms of dyspnea with exertion despite rate control. She denies snoring or alcohol use.  Today, she denies symptoms of palpitations, chest pain, shortness of breath, orthopnea, PND, lower extremity edema, dizziness, presyncope, syncope, snoring, daytime somnolence, bleeding, or neurologic sequela. The patient is tolerating medications without difficulties and is otherwise without complaint today.    Atrial Fibrillation Risk Factors:  she does not have symptoms or diagnosis of sleep apnea. she does not have a history of rheumatic fever. she does not have a history of alcohol use. The patient does not have a history of early familial atrial fibrillation or other arrhythmias.  she has a BMI of Body mass index is 32.15 kg/m.Marland Kitchen Filed Weights   09/22/19 0832  Weight: 72.2 kg    Family History  Problem Relation Age of Onset  . Heart disease Mother   . Diabetes Mother   . Emphysema Father   . Emphysema Brother   . Heart disease Brother   . Diabetes Brother   . Heart disease Brother      Atrial Fibrillation Management history:  Previous antiarrhythmic drugs: amiodarone Previous cardioversions: 07/08/19, 09/02/19 Previous ablations:  none CHADS2VASC score: 5 Anticoagulation history: Eliquis   Past Medical History:  Diagnosis Date  . A-fib ([place])   . Diabetes mellitus without complication ([place])   . HTN (hypertension)   . Hyperlipidemia   . Hypertension    Past Surgical History:  Procedure Laterality Date  . CARDIOVERSION N/A 07/08/2019   Procedure: CARDIOVERSION;  Surgeon: Adrian Prows, MD;  Location: [facility] ENDOSCOPY;  Service: Cardiovascular;  Laterality: N/A;  . CARDIOVERSION N/A 09/02/2019   Procedure: CARDIOVERSION;  Surgeon: Adrian Prows, MD;  Location: [facility];  Service: Cardiovascular;  Laterality: N/A;  . PARTIAL HYSTERECTOMY     1977    Current Outpatient Medications  Medication Sig Dispense Refill  . amiodarone (PACERONE) 200 MG tablet Take 1 tablet (200 mg total) by mouth 2 (two) times daily. 60 tablet 3  . amLODipine (NORVASC) 5 MG tablet Take 5 mg by mouth daily.  5  . apixaban (ELIQUIS) 5 MG TABS tablet Take 1 tablet (5 mg total) by mouth 2 (two) times daily. 60 tablet 3  . Apoaequorin (PREVAGEN PO) Take 1 tablet by mouth daily with lunch.    . beta carotene w/minerals (OCUVITE) tablet Take 1 tablet by mouth daily.    . carvedilol (COREG) 3.125 MG tablet Take 3.125 mg by mouth 2 (two) times daily.   12  . Cholecalciferol (VITAMIN D-3) 125 MCG (5000 UT) TABS Take 5,000 Units by mouth daily after lunch.    . Cyanocobalamin (VITAMIN B-12) 2500 MCG SUBL Place 2,500 mcg under the tongue daily with lunch.    . glimepiride (AMARYL) 4 MG tablet Take 4 mg by mouth 2 (two) times daily.  1  . hydrochlorothiazide (HYDRODIURIL) 12.5 MG tablet Take 12.5 mg by mouth daily.    Marland Kitchen losartan (COZAAR) 100 MG tablet Take 100 mg by mouth daily.   6  . metFORMIN (GLUCOPHAGE) 1000 MG tablet Take 1,000 mg by mouth 2 (two) times daily.   6  . Omega-3 Fatty Acids (FISH OIL PO) Take 1 capsule by mouth daily.    . TRESIBA 100 UNIT/ML SOLN Inject 5 Units into the skin daily.    . TRULICITY 1.5 MG/0.5ML SOPN Inject 1.5 mg into  the skin every Friday.      No current facility-administered medications for this encounter.    Allergies  Allergen Reactions  . Codeine Nausea And Vomiting  . Penicillins Anaphylaxis    Did it involve swelling of the face/tongue/throat, SOB, or low BP? Yes Did it involve sudden or severe rash/hives, skin peeling, or any reaction on the inside of your mouth or nose? Unknown Did you need to seek medical attention at a hospital or doctor's office? Yes--inpatient when reaction occurred When did it last happen?1964 or 1965 If all above answers are "NO", may proceed with cephalosporin use.   . Tramadol Itching    Social History   Socioeconomic History  . Marital status: Widowed    Spouse name: Not on file  . Number of children: 3  . Years of education: Not on file  . Highest education level: Not on file  Occupational History  . Not on file  Tobacco Use  . Smoking status: Former Smoker    Packs/day: 0.50    Years: 15.00    Pack years: 7.50    Types: Cigarettes    Quit date: 05/14/1985    Years since quitting: 34.3  . Smokeless tobacco: Never Used  Substance and Sexual Activity  . Alcohol use: No    Alcohol/week: 0.0 standard drinks  . Drug use: Never  . Sexual activity: Not on file  Other Topics Concern  . Not on file  Social History Narrative  . Not on file   Social Determinants of Health   Financial Resource Strain:   . Difficulty of Paying Living Expenses: Not on file  Food Insecurity:   . Worried About Programme researcher, broadcasting/film/video in the Last Year: Not on file  . Ran Out of Food in the Last Year: Not on file  Transportation Needs:   . Lack of Transportation (Medical): Not on file  . Lack of Transportation (Non-Medical): Not on file  Physical Activity:   . Days of Exercise per Week: Not on file  . Minutes of Exercise per Session: Not on file  Stress:   . Feeling of Stress : Not on file  Social Connections:   . Frequency of Communication with Friends and Family:  Not on file  . Frequency of Social Gatherings with Friends and Family: Not on file  . Attends Religious Services: Not on file  . Active Member of Clubs or Organizations: Not on file  . Attends Banker Meetings: Not on file  . Marital Status: Not on file  Intimate Partner Violence:   . Fear of Current or Ex-Partner: Not on file  . Emotionally Abused: Not on file  . Physically Abused: Not on file  . Sexually Abused: Not on file     ROS- All systems are reviewed and negative except as per the HPI above.  Physical Exam: Vitals:   09/22/19 0832  BP: (!) 160/90  Pulse: 74  Weight: 72.2 kg  Height: 4\' 11"  (1.499 m)    GEN- The patient is well appearing obese elderly female, alert and oriented x 3 today.   Head- normocephalic, atraumatic Eyes-  Sclera clear, conjunctiva pink Ears- hearing intact Oropharynx- clear Neck- supple  Lungs- Clear to ausculation bilaterally, normal work of breathing Heart- irregular rate and rhythm, no murmurs, rubs or gallops  GI- soft, NT, ND, + BS Extremities- no clubbing, cyanosis, or edema MS- no significant deformity or atrophy Skin- no rash or lesion Psych- euthymic mood, full affect Neuro- strength and sensation are intact  Wt Readings from Last 3 Encounters:  09/22/19 72.2 kg  09/18/19 70.5 kg  09/11/19 71 kg    EKG today demonstrates afib HR 74, slow R wave prog, QRS 74, QTc 475  Echo 05/26/19 demonstrated  Left ventricle cavity is normal in size. Mild concentric hypertrophy of the left ventricle. Normal LV systolic function with EF 55%. Normal global wall motion. Unable to evaluate diastolic function due to atrial  fibrillation.  Left atrial cavity is moderately dilated. Likely underestimated by volumetric assessment.  Trileaflet aortic valve. Trace aortic regurgitation.  Moderate (Grade III) mitral regurgitation.  Mild tricuspid regurgitation. Estimated pulmonary artery systolic pressure  is 35 mmHg. IVC is dilated with  blunted respiratory response. Estimated RA pressure  10-15 mmHg. Compared to previous study in 2012, mitral regurgitation, left atrial dilatation increased mild ot moderate. 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  Epic records are reviewed at length today  CHA2DS2-VASc Score = 5 The patient's score is based upon: CHF History: No HTN History: Yes Age : 48 + Diabetes History: Yes Stroke History: No Vascular Disease History: No Gender: Female      ASSESSMENT AND PLAN: 1. Persistent Atrial Fibrillation (ICD10:  I48.19) The patient's CHA2DS2-VASc score is 5, indicating a 7.2% annual risk of stroke.   General education about afib provided and questions answered. We discussed therapeutic options today including repeat DCCV on increased amio dose, changing AAD, and ablation. Patient would like to be considered for ablation. If she is felt not to be a good candidate, she is willing to consider dofetilide admission after amiodarone washout. Continue amiodarone 200 mg BID for now. Continue Eliquis 5 mg BID Continue coreg 3.125 mg BID  2. Secondary Hypercoagulable State (ICD10:  D68.69) The patient is at significant risk for stroke/thromboembolism based upon her CHA2DS2-VASc Score of 5.  Continue Apixaban (Eliquis).   3. Obesity Body mass index is 32.15 kg/m. Lifestyle modification was discussed at length including regular exercise and weight reduction.  4. HTN Elevated today, patient reports better readings at previous appointments. No changes today.   Follow up refer to EP to consider ablation.   61 PA-C [facility] [address] [place], [place] [phone] 09/22/2019 9:08 AM

## 2019-09-23 ENCOUNTER — Encounter (HOSPITAL_COMMUNITY): Payer: Self-pay

## 2019-09-23 ENCOUNTER — Ambulatory Visit (HOSPITAL_COMMUNITY): Admit: 2019-09-23 | Payer: Medicare Other | Admitting: Cardiology

## 2019-09-23 SURGERY — CARDIOVERSION
Anesthesia: General

## 2019-09-26 ENCOUNTER — Encounter: Payer: Self-pay | Admitting: Internal Medicine

## 2019-09-26 ENCOUNTER — Other Ambulatory Visit: Payer: Self-pay

## 2019-09-26 ENCOUNTER — Telehealth (INDEPENDENT_AMBULATORY_CARE_PROVIDER_SITE_OTHER): Payer: Medicare Other | Admitting: Internal Medicine

## 2019-09-26 VITALS — BP 154/74 | HR 73 | Ht 59.0 in | Wt 155.0 lb

## 2019-09-26 DIAGNOSIS — I340 Nonrheumatic mitral (valve) insufficiency: Secondary | ICD-10-CM | POA: Diagnosis not present

## 2019-09-26 DIAGNOSIS — I10 Essential (primary) hypertension: Secondary | ICD-10-CM

## 2019-09-26 DIAGNOSIS — I4819 Other persistent atrial fibrillation: Secondary | ICD-10-CM

## 2019-09-26 NOTE — Progress Notes (Signed)
Electrophysiology TeleHealth Note   Due to national recommendations of social distancing due to COVID 19, Audio/video telehealth visit is felt to be most appropriate for this patient at this time.  See MyChart message from today for patient consent regarding telehealth for [facility].   Date:  09/26/2019   ID:  Virginia Gilbert, DOB [birth year], MRN [id number]  Location: home  Provider location: [address], [place] Evaluation Performed: New patient consult  PCP:  Virginia Grippe, Gilbert  Cardiologist: Virginia Gilbert Electrophysiologist:  None   Chief Complaint:  Atrial fibrillation   History of Present Illness:    Virginia Gilbert is a 79 y.o. female who presents via audio/video conferencing for a telehealth visit today.   The patient is referred for new consultation regarding atrial fibrillation by Virginia Gilbert.  She has had persistent atrial fibrillation.  She has undergone 2 cardioversions and had ERAF after each cardioversion.  She has been placed on amiodarone and has remained in AF.  Echo 05/2019 shows moderate MR.  Today, she denies symptoms of palpitations, chest pain, orthopnea, PND, lower extremity edema, claudication, dizziness, presyncope, syncope, bleeding, or neurologic sequela. The patient is tolerating medications without difficulties and is otherwise without complaint today.   she denies symptoms of cough, fevers, chills, or new SOB worrisome for COVID 19.   Past Medical History:  Diagnosis Date  . A-fib ([facility])   . Diabetes mellitus without complication ([facility])   . HTN (hypertension)   . Hyperlipidemia   . Hypertension     Past Surgical History:  Procedure Laterality Date  . CARDIOVERSION N/A 07/08/2019   Procedure: CARDIOVERSION;  Surgeon: Virginia Gilbert;  Location: [facility] ENDOSCOPY;  Service: Cardiovascular;  Laterality: N/A;  . CARDIOVERSION N/A 09/02/2019   Procedure: CARDIOVERSION;  Surgeon: Virginia Gilbert;  Location: [facility] ENDOSCOPY;  Service: Cardiovascular;   Laterality: N/A;  . PARTIAL HYSTERECTOMY     1977    Current Outpatient Medications  Medication Sig Dispense Refill  . amiodarone (PACERONE) 200 MG tablet Take 1 tablet (200 mg total) by mouth 2 (two) times daily. 60 tablet 3  . amLODipine (NORVASC) 5 MG tablet Take 5 mg by mouth daily.  5  . apixaban (ELIQUIS) 5 MG TABS tablet Take 1 tablet (5 mg total) by mouth 2 (two) times daily. 60 tablet 3  . Apoaequorin (PREVAGEN PO) Take 1 tablet by mouth daily with lunch.    . beta carotene w/minerals (OCUVITE) tablet Take 1 tablet by mouth daily.    . carvedilol (COREG) 3.125 MG tablet Take 3.125 mg by mouth 2 (two) times daily.   12  . Cholecalciferol (VITAMIN D-3) 125 MCG (5000 UT) TABS Take 5,000 Units by mouth daily after lunch.    . Cyanocobalamin (VITAMIN B-12) 2500 MCG SUBL Place 2,500 mcg under the tongue daily with lunch.    . glimepiride (AMARYL) 4 MG tablet Take 4 mg by mouth 2 (two) times daily.   1  . hydrochlorothiazide (HYDRODIURIL) 12.5 MG tablet Take 12.5 mg by mouth daily.    Marland Kitchen losartan (COZAAR) 100 MG tablet Take 100 mg by mouth daily.   6  . metFORMIN (GLUCOPHAGE) 1000 MG tablet Take 1,000 mg by mouth 2 (two) times daily.   6  . Omega-3 Fatty Acids (FISH OIL PO) Take 1 capsule by mouth daily.    . TRESIBA 100 UNIT/ML SOLN Inject 5 Units into the skin daily.    . TRULICITY 1.5 MG/0.5ML SOPN Inject 1.5 mg  into the skin every Friday.      No current facility-administered medications for this visit.    Allergies:   Codeine, Penicillins, and Tramadol   Social History:  The patient  reports that she quit smoking about 34 years ago. Her smoking use included cigarettes. She has a 7.50 pack-year smoking history. She has never used smokeless tobacco. She reports that she does not drink alcohol or use drugs.   Family History:  The patient's family history includes Diabetes in her brother and mother; Emphysema in her brother and father; Heart disease in her brother, brother, and  mother.    ROS:  Please see the history of present illness.   All other systems are personally reviewed and negative.    Exam:    Vital Signs:  BP (!) 154/74   Pulse 73   Ht 4\' 11"  (1.499 m)   Wt 155 lb (70.3 kg)   BMI 31.31 kg/m    Well appearing, alert and conversant, regular work of breathing,  good skin color Eyes- anicteric, neuro- grossly intact, skin- no apparent rash or lesions or cyanosis, mouth- oral mucosa is pink   Labs/Other Tests and Data Reviewed:    Recent Labs: 06/24/2019: Platelets 181 09/02/2019: BUN 18; Creatinine, Ser 0.70; Hemoglobin 14.6; Potassium 3.9; Sodium 140   Wt Readings from Last 3 Encounters:  09/26/19 155 lb (70.3 kg)  09/22/19 159 lb 3.2 oz (72.2 kg)  09/18/19 155 lb 6.4 oz (70.5 kg)     Other studies personally reviewed: Additional studies/ records that were reviewed today include: echo, AF clinic noted, Virginia Gilbert notes   ASSESSMENT & PLAN:    1.  Persistent atrial fibrillation She has symptomatic medical refractory persistent atrial fibrillation, moderate LA enlargement and grade III MR by echo.  She would prefer a more definitive approach to her atrial fibrillation For now, continue amiodarone Will have Virginia Gilbert review records and see if he thinks she would be a candidate for MVR/MAZE and can order TEE at that point.  Continue Eliquis for CHADS2VASC of 4  2.  HTN Stable No change required today   Follow-up:  To be determined after follow up with Virginia Gilbert  Current medicines are reviewed at length with the patient today.   The patient does not have concerns regarding her medicines.  The following changes were made today:  none  Labs/ tests ordered today include: none No orders of the defined types were placed in this encounter.   Patient Risk:  after full review of this patients clinical status, I feel that they are at moderate risk at this time.   SignedThompson Grayer Gilbert, [place] 09/26/2019 12:54 PM   Virginia Gilbert [place] [address] [phone] (office) [phone] (fax)

## 2019-09-26 NOTE — H&P (View-Only) (Signed)
Electrophysiology TeleHealth Note   Due to national recommendations of social distancing due to COVID 19, Audio/video telehealth visit is felt to be most appropriate for this patient at this time.  See MyChart message from today for patient consent regarding telehealth for [facility].   Date:  09/26/2019   ID:  Carmina Miller, DOB [birth year], MRN [id number]  Location: home  Provider location: [address], [place] Evaluation Performed: New patient consult  PCP:  Pearson Grippe, MD  Cardiologist: Jacques Navy Electrophysiologist:  None   Chief Complaint:  Atrial fibrillation   History of Present Illness:    [name] is a 79 y.o. female who presents via audio/video conferencing for a telehealth visit today.   The patient is referred for new consultation regarding atrial fibrillation by Dr Jacques Navy.  She has had persistent atrial fibrillation.  She has undergone 2 cardioversions and had ERAF after each cardioversion.  She has been placed on amiodarone and has remained in AF.  Echo 05/2019 shows moderate MR.  Today, she denies symptoms of palpitations, chest pain, orthopnea, PND, lower extremity edema, claudication, dizziness, presyncope, syncope, bleeding, or neurologic sequela. The patient is tolerating medications without difficulties and is otherwise without complaint today.   she denies symptoms of cough, fevers, chills, or new SOB worrisome for COVID 19.   Past Medical History:  Diagnosis Date  . A-fib ([facility])   . Diabetes mellitus without complication ([facility])   . HTN (hypertension)   . Hyperlipidemia   . Hypertension     Past Surgical History:  Procedure Laterality Date  . CARDIOVERSION N/A 07/08/2019   Procedure: CARDIOVERSION;  Surgeon: Yates Decamp, MD;  Location: [facility] ENDOSCOPY;  Service: Cardiovascular;  Laterality: N/A;  . CARDIOVERSION N/A 09/02/2019   Procedure: CARDIOVERSION;  Surgeon: Yates Decamp, MD;  Location: [facility] ENDOSCOPY;  Service: Cardiovascular;   Laterality: N/A;  . PARTIAL HYSTERECTOMY     1977    Current Outpatient Medications  Medication Sig Dispense Refill  . amiodarone (PACERONE) 200 MG tablet Take 1 tablet (200 mg total) by mouth 2 (two) times daily. 60 tablet 3  . amLODipine (NORVASC) 5 MG tablet Take 5 mg by mouth daily.  5  . apixaban (ELIQUIS) 5 MG TABS tablet Take 1 tablet (5 mg total) by mouth 2 (two) times daily. 60 tablet 3  . Apoaequorin (PREVAGEN PO) Take 1 tablet by mouth daily with lunch.    . beta carotene w/minerals (OCUVITE) tablet Take 1 tablet by mouth daily.    . carvedilol (COREG) 3.125 MG tablet Take 3.125 mg by mouth 2 (two) times daily.   12  . Cholecalciferol (VITAMIN D-3) 125 MCG (5000 UT) TABS Take 5,000 Units by mouth daily after lunch.    . Cyanocobalamin (VITAMIN B-12) 2500 MCG SUBL Place 2,500 mcg under the tongue daily with lunch.    . glimepiride (AMARYL) 4 MG tablet Take 4 mg by mouth 2 (two) times daily.   1  . hydrochlorothiazide (HYDRODIURIL) 12.5 MG tablet Take 12.5 mg by mouth daily.    Marland Kitchen losartan (COZAAR) 100 MG tablet Take 100 mg by mouth daily.   6  . metFORMIN (GLUCOPHAGE) 1000 MG tablet Take 1,000 mg by mouth 2 (two) times daily.   6  . Omega-3 Fatty Acids (FISH OIL PO) Take 1 capsule by mouth daily.    . TRESIBA 100 UNIT/ML SOLN Inject 5 Units into the skin daily.    . TRULICITY 1.5 MG/0.5ML SOPN Inject 1.5 mg  into the skin every Friday.      No current facility-administered medications for this visit.    Allergies:   Codeine, Penicillins, and Tramadol   Social History:  The patient  reports that she quit smoking about 34 years ago. Her smoking use included cigarettes. She has a 7.50 pack-year smoking history. She has never used smokeless tobacco. She reports that she does not drink alcohol or use drugs.   Family History:  The patient's family history includes Diabetes in her brother and mother; Emphysema in her brother and father; Heart disease in her brother, brother, and  mother.    ROS:  Please see the history of present illness.   All other systems are personally reviewed and negative.    Exam:    Vital Signs:  BP (!) 154/74   Pulse 73   Ht 4' 11" (1.499 m)   Wt 155 lb (70.3 kg)   BMI 31.31 kg/m    Well appearing, alert and conversant, regular work of breathing,  good skin color Eyes- anicteric, neuro- grossly intact, skin- no apparent rash or lesions or cyanosis, mouth- oral mucosa is pink   Labs/Other Tests and Data Reviewed:    Recent Labs: 06/24/2019: Platelets 181 09/02/2019: BUN 18; Creatinine, Ser 0.70; Hemoglobin 14.6; Potassium 3.9; Sodium 140   Wt Readings from Last 3 Encounters:  09/26/19 155 lb (70.3 kg)  09/22/19 159 lb 3.2 oz (72.2 kg)  09/18/19 155 lb 6.4 oz (70.5 kg)     Other studies personally reviewed: Additional studies/ records that were reviewed today include: echo, AF clinic noted, Dr Acharya's notes   ASSESSMENT & PLAN:    1.  Persistent atrial fibrillation She has symptomatic medical refractory persistent atrial fibrillation, moderate LA enlargement and grade III MR by echo.  She would prefer a more definitive approach to her atrial fibrillation For now, continue amiodarone Will have Dr Owen review records and see if he thinks she would be a candidate for MVR/MAZE and can order TEE at that point.  Continue Eliquis for CHADS2VASC of 4  2.  HTN Stable No change required today   Follow-up:  To be determined after follow up with Dr Owen  Current medicines are reviewed at length with the patient today.   The patient does not have concerns regarding her medicines.  The following changes were made today:  none  Labs/ tests ordered today include: none No orders of the defined types were placed in this encounter.   Patient Risk:  after full review of this patients clinical status, I feel that they are at moderate risk at this time.   Signed, Ceairra Mccarver MD, FACC [facility] 09/26/2019 12:54 PM   [facility] [address] [place] [address] [phone] (office) [phone] (fax)  

## 2019-10-02 ENCOUNTER — Telehealth: Payer: Self-pay

## 2019-10-02 DIAGNOSIS — I340 Nonrheumatic mitral (valve) insufficiency: Secondary | ICD-10-CM

## 2019-10-02 DIAGNOSIS — I4819 Other persistent atrial fibrillation: Secondary | ICD-10-CM

## 2019-10-02 NOTE — Telephone Encounter (Signed)
-----   Message from Hillis Range, MD sent at 10/02/2019  2:59 PM EST ----- Dr Cornelius Moras advises that we do a TEE to further evaluate her MR.  Can you schedule?  ----- Message ----- From: Purcell Nails, MD Sent: 09/29/2019   8:52 AM EST To: Hillis Range, MD  I agree w/ getting TEE as next step ----- Message ----- From: Hillis Range, MD Sent: 09/26/2019  12:56 PM EST To: Purcell Nails, MD  Please look through her recent chart and echo.  She has persistent afib, moderate LA enlargement (LA 4.9) and grade III MR by a [place] Echo.  She would prefer a "permanent fix" rather than potential of multiple ablations. I can get a TEE but would like your initial thoughts before I proceed.

## 2019-10-02 NOTE — Progress Notes (Signed)
Dr Cornelius Moras and I have reviewed chart together.  He would advise TEE as the next step to further evaluate the patient's MR.  We will arrange TEE at the next available time.  Follow-up with me virtually 2-2 weeks after TEE for further steps

## 2019-10-03 NOTE — Telephone Encounter (Signed)
  You are scheduled for a TEE on October 08, 2019 with Dr. Jacques Navy.  Please arrive at the [facility] (Main Entrance A) at [facility]: [address] [place], [place] [address] at 8:30 am  DIET: Nothing to eat or drink after midnight except a sip of water with medications (see medication instructions below)  Medication Instructions: Take ONLY the following medications:  Amiodarone, Eliquis and carvedilol  You will need to continue your anticoagulant after your procedure until you  are told by your  Provider that it is safe to stop   Labs:  Lab work scheduled for 10/06/19 at the [facility] office  Covid test scheduled for 10/06/19 after your lab work at the [facility] at 10:15 am  You must have a responsible person to drive you home and stay in the waiting area during your procedure. Failure to do so could result in cancellation.  Bring your insurance cards.  *Special Note: Every effort is made to have your procedure done on time. Occasionally there are emergencies that occur at the hospital that may cause delays. Please be patient if a delay does occur.    Pt aware of instruction.  In agreement with test.

## 2019-10-06 ENCOUNTER — Other Ambulatory Visit: Payer: Self-pay

## 2019-10-06 ENCOUNTER — Other Ambulatory Visit (HOSPITAL_COMMUNITY)
Admission: RE | Admit: 2019-10-06 | Discharge: 2019-10-06 | Disposition: A | Discharge status: Home / Self Care | Payer: Medicare Other | Source: Ambulatory Visit | Attending: Internal Medicine | Admitting: Internal Medicine

## 2019-10-06 ENCOUNTER — Other Ambulatory Visit: Payer: Medicare Other | Admitting: *Deleted

## 2019-10-06 DIAGNOSIS — I4819 Other persistent atrial fibrillation: Secondary | ICD-10-CM | POA: Diagnosis not present

## 2019-10-06 DIAGNOSIS — I340 Nonrheumatic mitral (valve) insufficiency: Secondary | ICD-10-CM | POA: Diagnosis not present

## 2019-10-06 DIAGNOSIS — Z20822 Contact with and (suspected) exposure to covid-19: Secondary | ICD-10-CM | POA: Insufficient documentation

## 2019-10-06 DIAGNOSIS — Z01812 Encounter for preprocedural laboratory examination: Secondary | ICD-10-CM | POA: Insufficient documentation

## 2019-10-06 LAB — BASIC METABOLIC PANEL
BUN/Creatinine Ratio: 23 (ref 12–28)
BUN: 19 mg/dL (ref 8–27)
CO2: 31 mmol/L — ABNORMAL HIGH (ref 20–29)
Calcium: 9.3 mg/dL (ref 8.7–10.3)
Chloride: 100 mmol/L (ref 96–106)
Creatinine, Ser: 0.81 mg/dL (ref 0.57–1.00)
GFR calc Af Amer: 82 mL/min/{1.73_m2} (ref >59)
GFR calc non Af Amer: 71 mL/min/{1.73_m2} (ref >59)
Glucose: 97 mg/dL (ref 65–99)
Potassium: 4.1 mmol/L (ref 3.5–5.2)
Sodium: 136 mmol/L (ref 134–144)

## 2019-10-06 LAB — CBC WITH DIFFERENTIAL/PLATELET
Basophils Absolute: 0 10*3/uL (ref 0.0–0.2)
Basos: 0 %
EOS (ABSOLUTE): 0.3 10*3/uL (ref 0.0–0.4)
Eos: 4 %
Hematocrit: 37.5 % (ref 34.0–46.6)
Hemoglobin: 12 g/dL (ref 11.1–15.9)
Lymphocytes Absolute: 1.2 10*3/uL (ref 0.7–3.1)
Lymphs: 16 %
MCH: 28 pg (ref 26.6–33.0)
MCHC: 32 g/dL (ref 31.5–35.7)
MCV: 88 fL (ref 79–97)
Monocytes Absolute: 0.8 10*3/uL (ref 0.1–0.9)
Monocytes: 10 %
Neutrophils Absolute: 5.1 10*3/uL (ref 1.4–7.0)
Neutrophils: 70 %
Platelets: 226 10*3/uL (ref 150–450)
RBC: 4.28 x10E6/uL (ref 3.77–5.28)
RDW: 15.2 % (ref 11.7–15.4)
WBC: 7.4 10*3/uL (ref 3.4–10.8)

## 2019-10-06 LAB — SARS CORONAVIRUS 2 (TAT 6-24 HRS): SARS Coronavirus 2: NEGATIVE

## 2019-10-08 ENCOUNTER — Encounter (HOSPITAL_COMMUNITY): Payer: Self-pay | Admitting: Internal Medicine

## 2019-10-08 ENCOUNTER — Ambulatory Visit (HOSPITAL_COMMUNITY)
Admission: RE | Admit: 2019-10-08 | Discharge: 2019-10-08 | Disposition: A | Discharge status: Home / Self Care | Payer: Medicare Other | Attending: Internal Medicine | Admitting: Internal Medicine

## 2019-10-08 ENCOUNTER — Encounter (HOSPITAL_COMMUNITY)
Admission: RE | Disposition: A | Discharge status: Home / Self Care | Payer: Self-pay | Source: Home / Self Care | Attending: Internal Medicine

## 2019-10-08 ENCOUNTER — Other Ambulatory Visit: Payer: Self-pay

## 2019-10-08 ENCOUNTER — Ambulatory Visit (HOSPITAL_BASED_OUTPATIENT_CLINIC_OR_DEPARTMENT_OTHER)
Admission: RE | Admit: 2019-10-08 | Discharge: 2019-10-08 | Disposition: A | Discharge status: Home / Self Care | Payer: Medicare Other | Source: Ambulatory Visit | Attending: Internal Medicine | Admitting: Internal Medicine

## 2019-10-08 DIAGNOSIS — E785 Hyperlipidemia, unspecified: Secondary | ICD-10-CM | POA: Insufficient documentation

## 2019-10-08 DIAGNOSIS — Z794 Long term (current) use of insulin: Secondary | ICD-10-CM | POA: Diagnosis not present

## 2019-10-08 DIAGNOSIS — Z880 Allergy status to penicillin: Secondary | ICD-10-CM | POA: Diagnosis not present

## 2019-10-08 DIAGNOSIS — Z8249 Family history of ischemic heart disease and other diseases of the circulatory system: Secondary | ICD-10-CM | POA: Insufficient documentation

## 2019-10-08 DIAGNOSIS — Z7901 Long term (current) use of anticoagulants: Secondary | ICD-10-CM | POA: Diagnosis not present

## 2019-10-08 DIAGNOSIS — Z885 Allergy status to narcotic agent status: Secondary | ICD-10-CM | POA: Diagnosis not present

## 2019-10-08 DIAGNOSIS — Z888 Allergy status to other drugs, medicaments and biological substances status: Secondary | ICD-10-CM | POA: Diagnosis not present

## 2019-10-08 DIAGNOSIS — I10 Essential (primary) hypertension: Secondary | ICD-10-CM | POA: Diagnosis not present

## 2019-10-08 DIAGNOSIS — I700 Atherosclerosis of aorta: Secondary | ICD-10-CM | POA: Diagnosis not present

## 2019-10-08 DIAGNOSIS — I081 Rheumatic disorders of both mitral and tricuspid valves: Secondary | ICD-10-CM | POA: Diagnosis not present

## 2019-10-08 DIAGNOSIS — E119 Type 2 diabetes mellitus without complications: Secondary | ICD-10-CM | POA: Diagnosis not present

## 2019-10-08 DIAGNOSIS — Z87891 Personal history of nicotine dependence: Secondary | ICD-10-CM | POA: Insufficient documentation

## 2019-10-08 DIAGNOSIS — I4819 Other persistent atrial fibrillation: Secondary | ICD-10-CM | POA: Diagnosis not present

## 2019-10-08 DIAGNOSIS — I340 Nonrheumatic mitral (valve) insufficiency: Secondary | ICD-10-CM | POA: Diagnosis not present

## 2019-10-08 DIAGNOSIS — Z79899 Other long term (current) drug therapy: Secondary | ICD-10-CM | POA: Diagnosis not present

## 2019-10-08 HISTORY — PX: TEE WITHOUT CARDIOVERSION: SHX5443

## 2019-10-08 HISTORY — PX: BUBBLE STUDY: SHX6837

## 2019-10-08 LAB — GLUCOSE, CAPILLARY: Glucose-Capillary: 71 mg/dL (ref 70–99)

## 2019-10-08 SURGERY — ECHOCARDIOGRAM, TRANSESOPHAGEAL
Anesthesia: Moderate Sedation

## 2019-10-08 MED ORDER — SODIUM CHLORIDE 0.9 % IV SOLN: INTRAVENOUS | Status: DC

## 2019-10-08 MED ORDER — MIDAZOLAM HCL (PF) 5 MG/ML IJ SOLN
INTRAMUSCULAR | Status: AC
  Filled 2019-10-08: qty 2

## 2019-10-08 MED ORDER — FENTANYL CITRATE (PF) 100 MCG/2ML IJ SOLN
INTRAMUSCULAR | Status: DC | PRN
  Administered 2019-10-08: 25 ug via INTRAVENOUS
  Administered 2019-10-08: 50 ug via INTRAVENOUS

## 2019-10-08 MED ORDER — BUTAMBEN-TETRACAINE-BENZOCAINE 2-2-14 % EX AERO
INHALATION_SPRAY | CUTANEOUS | Status: DC | PRN
  Administered 2019-10-08: 2 via TOPICAL

## 2019-10-08 MED ORDER — DIPHENHYDRAMINE HCL 50 MG/ML IJ SOLN
INTRAMUSCULAR | Status: AC
  Filled 2019-10-08: qty 1

## 2019-10-08 MED ORDER — FENTANYL CITRATE (PF) 100 MCG/2ML IJ SOLN
INTRAMUSCULAR | Status: AC
  Filled 2019-10-08: qty 2

## 2019-10-08 MED ORDER — MIDAZOLAM HCL (PF) 10 MG/2ML IJ SOLN
INTRAMUSCULAR | Status: DC | PRN
  Administered 2019-10-08 (×2): 2 mg via INTRAVENOUS

## 2019-10-08 NOTE — CV Procedure (Signed)
INDICATIONS: mitral regurgitation  PROCEDURE:   Informed consent was obtained prior to the procedure. The risks, benefits and alternatives for the procedure were discussed and the patient comprehended these risks.  Risks include, but are not limited to, cough, sore throat, vomiting, nausea, somnolence, esophageal and stomach trauma or perforation, bleeding, low blood pressure, aspiration, pneumonia, infection, trauma to the teeth and death.    After a procedural time-out, the oropharynx was anesthetized with 20% benzocaine spray.   During this procedure the patient was administered a total of Versed 4 mg and Fentanyl 75 mg to achieve and maintain moderate conscious sedation.  The patient's heart rate, blood pressure, and oxygen saturationweare monitored continuously during the procedure. The period of conscious sedation was 43 minutes, of which I was present face-to-face 100% of this time.  The transesophageal probe was inserted in the esophagus and stomach without difficulty and multiple views were obtained.  The patient was kept under observation until the patient left the procedure room.  The patient left the procedure room in stable condition.   Agitated microbubble saline contrast was administered.  COMPLICATIONS:    There were no immediate complications.  FINDINGS:   Mild mitral valve regurgitation Trivial degenerative change of the mitral valve. Mild mitral annular calcifications. Mild to mild-moderate tricuspid valve regurgitation.  No PFO by color flow or agitated saline.  LVEF 65% No LA appendage thrombus.  RECOMMENDATIONS:    Stable, can be dismissed when alert.  Follow up in my clinic in 2-4 weeks, follow up Dr. Johney Frame per afib clinic.    Time Spent Directly with the Patient:  75 minutes   [name] 10/08/2019, 10:41 AM

## 2019-10-08 NOTE — Discharge Instructions (Signed)

## 2019-10-08 NOTE — Progress Notes (Signed)
  Echocardiogram Echocardiogram Transesophageal has been performed.  Virginia Gilbert 10/08/2019, 10:44 AM

## 2019-10-08 NOTE — Interval H&P Note (Signed)
History and Physical Interval Note:  10/08/2019 9:05 AM  Virginia Gilbert  has presented today for surgery, with the diagnosis of MITRAL  REGURGITATION.  The various methods of treatment have been discussed with the patient and family. After consideration of risks, benefits and other options for treatment, the patient has consented to  Procedure(s): TRANSESOPHAGEAL ECHOCARDIOGRAM (TEE) (N/A) as a surgical intervention.  The patient's history has been reviewed, patient examined, no change in status, stable for surgery.  I have reviewed the patient's chart and labs.  Questions were answered to the patient's satisfaction.     [name]

## 2019-10-09 NOTE — Progress Notes (Signed)
Attempted, unable to reach pt or leave message/bt

## 2019-10-16 DIAGNOSIS — E118 Type 2 diabetes mellitus with unspecified complications: Secondary | ICD-10-CM | POA: Diagnosis not present

## 2019-10-16 DIAGNOSIS — I10 Essential (primary) hypertension: Secondary | ICD-10-CM | POA: Diagnosis not present

## 2019-10-21 ENCOUNTER — Other Ambulatory Visit: Payer: Self-pay

## 2019-10-21 ENCOUNTER — Ambulatory Visit: Payer: Medicare Other | Admitting: Internal Medicine

## 2019-10-21 VITALS — BP 154/86 | HR 78 | Ht 59.0 in | Wt 157.8 lb

## 2019-10-21 DIAGNOSIS — I4819 Other persistent atrial fibrillation: Secondary | ICD-10-CM | POA: Diagnosis not present

## 2019-10-21 DIAGNOSIS — I10 Essential (primary) hypertension: Secondary | ICD-10-CM | POA: Diagnosis not present

## 2019-10-21 DIAGNOSIS — I340 Nonrheumatic mitral (valve) insufficiency: Secondary | ICD-10-CM | POA: Diagnosis not present

## 2019-10-21 MED ORDER — APIXABAN 5 MG PO TABS: 5.0000 mg | ORAL_TABLET | Freq: Two times a day (BID) | ORAL | 1 refills | Status: DC

## 2019-10-21 MED ORDER — AMIODARONE HCL 200 MG PO TABS: 200.0000 mg | ORAL_TABLET | Freq: Two times a day (BID) | ORAL | 3 refills | Status: DC

## 2019-10-21 NOTE — Patient Instructions (Signed)
Medication Instructions:  USE TYLENOL AS NEEDED INSTEAD OF IBUPROFEN  *If you need a refill on your cardiac medications before your next appointment, please call your pharmacy*  Lab Work: NONE   Testing/Procedures: NONE  Follow-Up: At [facility], you and your health needs are our priority.  As part of our continuing mission to provide you with exceptional heart care, we have created designated Provider Care Teams.  These Care Teams include your primary Cardiologist (physician) and Advanced Practice Providers (APPs -  Physician Assistants and Nurse Practitioners) who all work together to provide you with the care you need, when you need it.  We recommend signing up for the patient portal called "MyChart".  Sign up information is provided on this After Visit Summary.  MyChart is used to connect with patients for Virtual Visits (Telemedicine).  Patients are able to view lab/test results, encounter notes, upcoming appointments, etc.  Non-urgent messages can be sent to your provider as well.   To learn more about what you can do with MyChart, go to [email or web address].    Your next appointment:   6 week(s)  The format for your next appointment:   In Person  Provider:   You may see DR Jacques Navy  or one of the following Advanced Practice Providers on your designated Care Team:    Theodore Demark, PA-C  Joni Reining, DNP, ANP  Cadence Fransico Michael, NP

## 2019-10-21 NOTE — Progress Notes (Signed)
Cardiology Office Note:    Date:  10/21/2019   ID:  Virginia Gilbert, DOB 1941-02-20, MRN [id number]  PCP:  Pearson Grippe, MD  Cardiologist:  No primary care provider on file.  Electrophysiologist:  None   Referring MD: Pearson Grippe, MD   Chief Complaint: Afib  History of Present Illness:    Virginia Gilbert is a 79 y.o. female with a history of atrial fibrillation, diabetes mellitus, hypertension, hyperlipidemia who presents today for continued management of atrial fibrillation. She is also being followed by Dr. Hillis Range and has an appointment with him tomorrow.   She continues to feel poorly in atrial fibrillation. We discussed results of TEE again, I was the performing physician and we discussed these results at time of procedure as well. She is looking forward to discussing options with Dr. Johney Frame given no surgical mitral valve disease.   No chest pain. Describes DOE and fatigue.   She continues on amiodarone.  Past Medical History:  Diagnosis Date  . Diabetes mellitus without complication (HCC)   . Hyperlipidemia   . Hypertension   . Mitral regurgitation    evaluated by TEE 09/2019  . Persistent atrial fibrillation [facility])     Past Surgical History:  Procedure Laterality Date  . BUBBLE STUDY  10/08/2019   Procedure: BUBBLE STUDY;  Surgeon: [name], MD;  Location: [facility] ENDOSCOPY;  Service: Cardiology;;  . CARDIOVERSION N/A 07/08/2019   Procedure: CARDIOVERSION;  Surgeon: Yates Decamp, MD;  Location: [facility] ENDOSCOPY;  Service: Cardiovascular;  Laterality: N/A;  . CARDIOVERSION N/A 09/02/2019   Procedure: CARDIOVERSION;  Surgeon: Yates Decamp, MD;  Location: [facility] ENDOSCOPY;  Service: Cardiovascular;  Laterality: N/A;  . PARTIAL HYSTERECTOMY     1977  . TEE WITHOUT CARDIOVERSION N/A 10/08/2019   Procedure: TRANSESOPHAGEAL ECHOCARDIOGRAM (TEE);  Surgeon: [name], MD;  Location: [facility] ENDOSCOPY;  Service: Cardiology;  Laterality: N/A;    Current Medications: Current Meds    Medication Sig  . amiodarone (PACERONE) 200 MG tablet Take 1 tablet (200 mg total) by mouth 2 (two) times daily.  Marland Kitchen amLODipine (NORVASC) 5 MG tablet Take 5 mg by mouth daily.  Marland Kitchen apixaban (ELIQUIS) 5 MG TABS tablet Take 1 tablet (5 mg total) by mouth 2 (two) times daily.  . carvedilol (COREG) 3.125 MG tablet Take 3.125 mg by mouth 2 (two) times daily.   . Cholecalciferol (VITAMIN D-3) 125 MCG (5000 UT) TABS Take 5,000 Units by mouth daily after lunch.  Marland Kitchen CINNAMON PO Take 1,000 mg by mouth daily.  . Cyanocobalamin (VITAMIN B-12) 2500 MCG SUBL Place 2,500 mcg under the tongue daily with lunch.  . glimepiride (AMARYL) 4 MG tablet Take 4 mg by mouth 2 (two) times daily.   . hydrochlorothiazide (HYDRODIURIL) 12.5 MG tablet Take 12.5 mg by mouth daily.  Marland Kitchen losartan (COZAAR) 100 MG tablet Take 100 mg by mouth daily.   . metFORMIN (GLUCOPHAGE) 1000 MG tablet Take 1,000 mg by mouth 2 (two) times daily.   . Omega-3 Fatty Acids (FISH OIL) 1000 MG CAPS Take 1,000 mg by mouth daily.  . TRESIBA 100 UNIT/ML SOLN Inject 5 Units into the skin daily.  . TRULICITY 1.5 MG/0.5ML SOPN Inject 1.5 mg into the skin every Friday.   . [DISCONTINUED] amiodarone (PACERONE) 200 MG tablet Take 1 tablet (200 mg total) by mouth 2 (two) times daily.  . [DISCONTINUED] apixaban (ELIQUIS) 5 MG TABS tablet Take 1 tablet (5 mg total) by mouth 2 (two) times daily.  . [  DISCONTINUED] apixaban (ELIQUIS) 5 MG TABS tablet Take 1 tablet (5 mg total) by mouth 2 (two) times daily.  . [DISCONTINUED] ibuprofen (ADVIL) 200 MG tablet Take 400 mg by mouth every 6 (six) hours as needed for headache or moderate pain.     Allergies:   Codeine, Penicillins, and Tramadol   Social History   Socioeconomic History  . Marital status: Widowed    Spouse name: Not on file  . Number of children: 3  . Years of education: Not on file  . Highest education level: Not on file  Occupational History  . Not on file  Tobacco Use  . Smoking status: Former  Smoker    Packs/day: 0.50    Years: 15.00    Pack years: 7.50    Types: Cigarettes    Quit date: 05/14/1985    Years since quitting: 34.5  . Smokeless tobacco: Never Used  Substance and Sexual Activity  . Alcohol use: No    Alcohol/week: 0.0 standard drinks  . Drug use: Never  . Sexual activity: Not on file  Other Topics Concern  . Not on file  Social History Narrative  . Not on file   Social Determinants of Health   Financial Resource Strain:   . Difficulty of Paying Living Expenses:   Food Insecurity:   . Worried About Programme researcher, broadcasting/film/video in the Last Year:   . Barista in the Last Year:   Transportation Needs:   . Freight forwarder (Medical):   Marland Kitchen Lack of Transportation (Non-Medical):   Physical Activity:   . Days of Exercise per Week:   . Minutes of Exercise per Session:   Stress:   . Feeling of Stress :   Social Connections:   . Frequency of Communication with Friends and Family:   . Frequency of Social Gatherings with Friends and Family:   . Attends Religious Services:   . Active Member of Clubs or Organizations:   . Attends Banker Meetings:   Marland Kitchen Marital Status:      Family History: The patient's family history includes Diabetes in her brother and mother; Emphysema in her brother and father; Heart disease in her brother, brother, and mother.  ROS:   Please see the history of present illness.    All other systems reviewed and are negative.  EKGs/Labs/Other Studies Reviewed:    The following studies were reviewed today:  EKG:  n/a  Recent Labs: 10/06/2019: BUN 19; Creatinine, Ser 0.81; Hemoglobin 12.0; Platelets 226; Potassium 4.1; Sodium 136  Recent Lipid Panel No results found for: CHOL, TRIG, HDL, CHOLHDL, VLDL, LDLCALC, LDLDIRECT  Physical Exam:    VS:  BP (!) 154/86   Pulse 78   Ht 4\' 11"  (1.499 m)   Wt 157 lb 12.8 oz (71.6 kg)   SpO2 98%   BMI 31.87 kg/m     Wt Readings from Last 5 Encounters:  10/22/19 157 lb (71.2  kg)  10/21/19 157 lb 12.8 oz (71.6 kg)  10/08/19 155 lb (70.3 kg)  09/26/19 155 lb (70.3 kg)  09/22/19 159 lb 3.2 oz (72.2 kg)     Constitutional: No acute distress Eyes: sclera non-icteric, normal conjunctiva and lids ENMT: normal dentition, moist mucous membranes Cardiovascular: regular rhythm, normal rate, no murmurs. S1 and S2 normal. Radial pulses normal bilaterally. No jugular venous distention.  Respiratory: clear to auscultation bilaterally GI : normal bowel sounds, soft and nontender. No distention.   MSK: extremities warm, well perfused.  No edema.  NEURO: grossly nonfocal exam, moves all extremities. PSYCH: alert and oriented x 3, normal mood and affect.   ASSESSMENT:    1. Essential hypertension   2. Persistent atrial fibrillation ([place])   3. Mild mitral regurgitation    PLAN:    Essential hypertension - BP spuriously elevated today per patient given stress. No change to therapy at this time.   Persistent atrial fibrillation  - follow up with Dr. Rayann Heman tomorrow. Continue amiodarone at this time, continue Eliquis.   Mild mitral regurgitation  - mild by TEE, reassess with echo after restoration of sinus rhythm.   Total time of encounter: 30 minutes total time of encounter, including 17 minutes spent in face-to-face patient care. This time includes coordination of care and counseling regarding above mentioned problem list. Remainder of non-face-to-face time involved reviewing chart documents/testing relevant to the patient encounter and documentation in the medical record. I have independently reviewed documentation from referring provider.   Cherlynn Kaiser, MD [place]  [facility]    Medication Adjustments/Labs and Tests Ordered: Current medicines are reviewed at length with the patient today.  Concerns regarding medicines are outlined above.  No orders of the defined types were placed in this encounter.  Meds ordered this encounter  Medications  .  amiodarone (PACERONE) 200 MG tablet    Sig: Take 1 tablet (200 mg total) by mouth 2 (two) times daily.    Dispense:  180 tablet    Refill:  3  . DISCONTD: apixaban (ELIQUIS) 5 MG TABS tablet    Sig: Take 1 tablet (5 mg total) by mouth 2 (two) times daily.    Dispense:  180 tablet    Refill:  1  . apixaban (ELIQUIS) 5 MG TABS tablet    Sig: Take 1 tablet (5 mg total) by mouth 2 (two) times daily.    Dispense:  180 tablet    Refill:  1    Patient Instructions  Medication Instructions:  USE TYLENOL AS NEEDED INSTEAD OF IBUPROFEN  *If you need a refill on your cardiac medications before your next appointment, please call your pharmacy*  Lab Work: NONE   Testing/Procedures: NONE  Follow-Up: At [facility], you and your health needs are our priority.  As part of our continuing mission to provide you with exceptional heart care, we have created designated Provider Care Teams.  These Care Teams include your primary Cardiologist (physician) and Advanced Practice Providers (APPs -  Physician Assistants and Nurse Practitioners) who all work together to provide you with the care you need, when you need it.  We recommend signing up for the patient portal called "MyChart".  Sign up information is provided on this After Visit Summary.  MyChart is used to connect with patients for Virtual Visits (Telemedicine).  Patients are able to view lab/test results, encounter notes, upcoming appointments, etc.  Non-urgent messages can be sent to your provider as well.   To learn more about what you can do with MyChart, go to [email or web address].    Your next appointment:   6 week(s)  The format for your next appointment:   In Person  Provider:   You may see DR Margaretann Loveless  or one of the following Advanced Practice Providers on your designated Care Team:    Rosaria Ferries, PA-C  Jory Sims, DNP, ANP  Cadence Kathlen Mody, NP

## 2019-10-22 ENCOUNTER — Encounter: Payer: Self-pay | Admitting: *Deleted

## 2019-10-22 ENCOUNTER — Encounter: Payer: Self-pay | Admitting: Internal Medicine

## 2019-10-22 ENCOUNTER — Telehealth (INDEPENDENT_AMBULATORY_CARE_PROVIDER_SITE_OTHER): Payer: Medicare Other | Admitting: Internal Medicine

## 2019-10-22 ENCOUNTER — Telehealth: Payer: Self-pay | Admitting: *Deleted

## 2019-10-22 VITALS — BP 155/72 | HR 76 | Ht 59.0 in | Wt 157.0 lb

## 2019-10-22 DIAGNOSIS — I340 Nonrheumatic mitral (valve) insufficiency: Secondary | ICD-10-CM | POA: Diagnosis not present

## 2019-10-22 DIAGNOSIS — I4819 Other persistent atrial fibrillation: Secondary | ICD-10-CM | POA: Diagnosis not present

## 2019-10-22 DIAGNOSIS — D6869 Other thrombophilia: Secondary | ICD-10-CM

## 2019-10-22 DIAGNOSIS — Z01812 Encounter for preprocedural laboratory examination: Secondary | ICD-10-CM

## 2019-10-22 DIAGNOSIS — I4891 Unspecified atrial fibrillation: Secondary | ICD-10-CM

## 2019-10-22 DIAGNOSIS — I10 Essential (primary) hypertension: Secondary | ICD-10-CM | POA: Diagnosis not present

## 2019-10-22 NOTE — Telephone Encounter (Signed)
Afib ablation scheduled for 4/20. Letter of instructions reviewed w/ pt and mailed to address. [facility] office will call to arrange CT and post procedure follow up. Pt will stop by [place] office on 4/7 for pre procedure lab work. Covid screening scheduled for 4/17/ Patient verbalized understanding and agreeable to plan.

## 2019-10-22 NOTE — Telephone Encounter (Signed)
Hillis Range, MD  Wiliam Ke, RN; Baird Lyons, RN  Afib ablation  [place]/ICE/Anesthesia   Cardiac CT

## 2019-10-22 NOTE — Progress Notes (Signed)
Your cardiac CT will be scheduled at one of the below locations:   [facility] [address] [place], [place] [address] [phone]  OR  [facility] [address] [place], [place] [address] [phone]  If scheduled at [facility], please arrive at the [facility] main entrance of [facility] 30 minutes prior to test start time. Proceed to the [facility] Radiology Department (first floor) to check-in and test prep.  If scheduled at [facility], please arrive 15 mins early for check-in and test prep.  Please follow these instructions carefully (unless otherwise directed):  Hold all erectile dysfunction medications at least 3 days (72 hrs) prior to test.  On the Night Before the Test: . Be sure to Drink plenty of water. . Do not consume any caffeinated/decaffeinated beverages or chocolate 12 hours prior to your test. . Do not take any antihistamines 12 hours prior to your test. . If you take Metformin do not take 24 hours prior to test. . If the patient has contrast allergy: ? Patient will need a prescription for Prednisone and very clear instructions (as follows): 1. Prednisone 50 mg - take 13 hours prior to test 2. Take another Prednisone 50 mg 7 hours prior to test 3. Take another Prednisone 50 mg 1 hour prior to test 4. Take Benadryl 50 mg 1 hour prior to test . Patient must complete all four doses of above prophylactic medications. . Patient will need a ride after test due to Benadryl.  On the Day of the Test: . Drink plenty of water. Do not drink any water within one hour of the test. . Do not eat any food 4 hours prior to the test. . You may take your regular medications prior to the test.  . Take metoprolol (Lopressor) two hours prior to test. . HOLD Furosemide/Hydrochlorothiazide morning of the test. . FEMALES- please wear underwire-free bra if available   *For  Clinical Staff only. Please instruct patient the following:*        -Drink plenty of water       -Hold Furosemide/hydrochlorothiazide morning of the test       -Take metoprolol (Lopressor) 2 hours prior to test (if applicable).                  -If HR is less than 55 BPM- No Lopressor                -IF HR is greater than 55 BPM and patient is less than or equal to 77 yrs old Lopressor 100mg  x1.                -If HR is greater than 55 BPM and patient is greater than 77 yrs old Lopressor 50 mg x1.     Do not give Lopressor to patients with an allergy to lopressor or anyone with asthma or active COPD symptoms (currently taking steroids).       After the Test: . Drink plenty of water. . After receiving IV contrast, you may experience a mild flushed feeling. This is normal. . On occasion, you may experience a mild rash up to 24 hours after the test. This is not dangerous. If this occurs, you can take Benadryl 25 mg and increase your fluid intake. . If you experience trouble breathing, this can be serious. If it is severe call 911 IMMEDIATELY. If it is mild, please call our office. 61  If you take any of these medications: Glipizide/Metformin, Avandament, Glucavance, please do not take 48 hours after completing test unless otherwise instructed.   Once we have confirmed authorization from your insurance company, we will call you to set up a date and time for your test.   For non-scheduling related questions, please contact the cardiac imaging nurse navigator should you have any questions/concerns: Marchia Bond, RN Navigator Cardiac Imaging [facility] [phone] office

## 2019-10-22 NOTE — Progress Notes (Signed)
Electrophysiology TeleHealth Note   Due to national recommendations of social distancing due to [place] 19, an audio telehealth visit is felt to be most appropriate for this patient at this time.  Verbal consent was obtained for the procedure today.  Date:  10/22/2019   ID:  Virginia Gilbert, DOB 07/29/1941, MRN [id number]  Location: patient's home  Provider location:  [facility]  Evaluation Performed: Follow-up visit  PCP:  Jani Gravel, MD   Electrophysiologist:  Dr Rayann Heman  Chief Complaint:  palpitations  History of Present Illness:    Virginia Gilbert is a 79 y.o. female who presents via telehealth conferencing today.  Since last being seen in our clinic, the patient reports doing reasonably well. She has substantial fatigue.  She has poor exercise tolerance and does not feel well.  She attributes this to afib.  Today, she denies symptoms of palpitations, chest pain,  lower extremity edema, dizziness, presyncope, or syncope.  The patient is otherwise without complaint today.  The patient denies symptoms of fevers, chills, cough, or new SOB worrisome for COVID 19.  Past Medical History:  Diagnosis Date  . Diabetes mellitus without complication ([place])   . Hyperlipidemia   . Hypertension   . Mitral regurgitation    evaluated by TEE 09/2019  . Persistent atrial fibrillation [facility])     Past Surgical History:  Procedure Laterality Date  . BUBBLE STUDY  10/08/2019   Procedure: BUBBLE STUDY;  Surgeon: Elouise Munroe, MD;  Location: [facility] ENDOSCOPY;  Service: Cardiology;;  . CARDIOVERSION N/A 07/08/2019   Procedure: CARDIOVERSION;  Surgeon: Adrian Prows, MD;  Location: [facility] ENDOSCOPY;  Service: Cardiovascular;  Laterality: N/A;  . CARDIOVERSION N/A 09/02/2019   Procedure: CARDIOVERSION;  Surgeon: Adrian Prows, MD;  Location: [place];  Service: Cardiovascular;  Laterality: N/A;  . PARTIAL HYSTERECTOMY     1977  . TEE WITHOUT CARDIOVERSION N/A 10/08/2019   Procedure: TRANSESOPHAGEAL  ECHOCARDIOGRAM (TEE);  Surgeon: Elouise Munroe, MD;  Location: [place];  Service: Cardiology;  Laterality: N/A;    Current Outpatient Medications  Medication Sig Dispense Refill  . amiodarone (PACERONE) 200 MG tablet Take 1 tablet (200 mg total) by mouth 2 (two) times daily. 180 tablet 3  . amLODipine (NORVASC) 5 MG tablet Take 5 mg by mouth daily.  5  . apixaban (ELIQUIS) 5 MG TABS tablet Take 1 tablet (5 mg total) by mouth 2 (two) times daily. 180 tablet 1  . carvedilol (COREG) 3.125 MG tablet Take 3.125 mg by mouth 2 (two) times daily.   12  . Cholecalciferol (VITAMIN D-3) 125 MCG (5000 UT) TABS Take 5,000 Units by mouth daily after lunch.    Marland Kitchen CINNAMON PO Take 1,000 mg by mouth daily.    . Cyanocobalamin (VITAMIN B-12) 2500 MCG SUBL Place 2,500 mcg under the tongue daily with lunch.    . glimepiride (AMARYL) 4 MG tablet Take 4 mg by mouth 2 (two) times daily.   1  . hydrochlorothiazide (HYDRODIURIL) 12.5 MG tablet Take 12.5 mg by mouth daily.    Marland Kitchen losartan (COZAAR) 100 MG tablet Take 100 mg by mouth daily.   6  . metFORMIN (GLUCOPHAGE) 1000 MG tablet Take 1,000 mg by mouth 2 (two) times daily.   6  . Omega-3 Fatty Acids (FISH OIL) 1000 MG CAPS Take 1,000 mg by mouth daily.    . TRESIBA 100 UNIT/ML SOLN Inject 5 Units into the skin daily.    . TRULICITY 1.5 XH/3.7JI SOPN  Inject 1.5 mg into the skin every Friday.      No current facility-administered medications for this visit.    Allergies:   Codeine, Penicillins, and Tramadol   Social History:  The patient  reports that she quit smoking about 34 years ago. Her smoking use included cigarettes. She has a 7.50 pack-year smoking history. She has never used smokeless tobacco. She reports that she does not drink alcohol or use drugs.   ROS:  Please see the history of present illness.   All other systems are personally reviewed and negative.    Exam:    Vital Signs:  BP (!) 155/72   Pulse 76   Ht 4\' 11"  (1.499 m)   Wt 157 lb  (71.2 kg)   BMI 31.71 kg/m   Well sounding, alert and conversant  Labs/Other Tests and Data Reviewed:    Recent Labs: 10/06/2019: BUN 19; Creatinine, Ser 0.81; Hemoglobin 12.0; Platelets 226; Potassium 4.1; Sodium 136   Wt Readings from Last 3 Encounters:  10/22/19 157 lb (71.2 kg)  10/21/19 157 lb 12.8 oz (71.6 kg)  10/08/19 155 lb (70.3 kg)     TEE is reviewed with Dr 10/10/19 and discussed with Dr Annamaria Helling  ASSESSMENT & PLAN:    1.  Persistent afib The patient has symptomatic, recurrent persistent atrial fibrillation. she has failed medical therapy with amiodarone. Chads2vasc score is 4.  she is anticoagulated with eliquis . Therapeutic strategies for afib including ablation was discussed in detail with the patient today. Risk, benefits, and alternatives to EP study and radiofrequency ablation for afib were also discussed in detail today. These risks include but are not limited to stroke, bleeding, vascular damage, tamponade, perforation, damage to the esophagus, lungs, and other structures, pulmonary vein stenosis, worsening renal function, and death. The patient understands these risk and wishes to proceed.  We will therefore proceed with catheter ablation at the next available time.  Carto, ICE, anesthesia are requested for the procedure.  Will also obtain cardiac CT prior to the procedure to exclude LAA thrombus and further evaluate atrial anatomy.  Continue amiodarone 200mg  daily for now  2. Mitral regurgitation Not sugical by recent TEE Hopefully this will improve with sinus rhythm post ablation  3 HTN Stable No change required today   Patient Risk:  after full review of this patients clinical status, I feel that they are at high risk at this time.  Today, I have spent 20 minutes with the patient with telehealth technology discussing arrhythmia management .    Cornelius Moras, MD  10/22/2019 9:24 AM     [facility] [address] [place] [phone] (office) [phone] (fax)

## 2019-11-13 ENCOUNTER — Other Ambulatory Visit: Payer: Self-pay | Admitting: Internal Medicine

## 2019-11-13 MED ORDER — AMIODARONE HCL 200 MG PO TABS: 200.0000 mg | ORAL_TABLET | Freq: Two times a day (BID) | ORAL | 1 refills | Status: DC

## 2019-11-13 NOTE — Telephone Encounter (Signed)
  *  STAT* If patient is at the pharmacy, call can be transferred to refill team.   1. Which medications need to be refilled? (please list name of each medication and dose if known) amiodarone (PACERONE) 200 MG tablet  2. Which pharmacy/location (including street and city if local pharmacy) is medication to be sent to? [facility] - [place], [place] - [address]  3. Do they need a 30 day or 90 day supply? 90 days

## 2019-11-19 ENCOUNTER — Other Ambulatory Visit: Payer: Self-pay

## 2019-11-19 ENCOUNTER — Other Ambulatory Visit: Payer: Medicare Other | Admitting: *Deleted

## 2019-11-19 DIAGNOSIS — Z01812 Encounter for preprocedural laboratory examination: Secondary | ICD-10-CM

## 2019-11-19 DIAGNOSIS — I4891 Unspecified atrial fibrillation: Secondary | ICD-10-CM | POA: Diagnosis not present

## 2019-11-19 LAB — CBC
Hematocrit: 39.9 % (ref 34.0–46.6)
Hemoglobin: 13 g/dL (ref 11.1–15.9)
MCH: 28.4 pg (ref 26.6–33.0)
MCHC: 32.6 g/dL (ref 31.5–35.7)
MCV: 87 fL (ref 79–97)
Platelets: 282 10*3/uL (ref 150–450)
RBC: 4.57 x10E6/uL (ref 3.77–5.28)
RDW: 15.7 % — ABNORMAL HIGH (ref 11.7–15.4)
WBC: 7.5 10*3/uL (ref 3.4–10.8)

## 2019-11-19 LAB — BASIC METABOLIC PANEL
BUN/Creatinine Ratio: 27 (ref 12–28)
BUN: 20 mg/dL (ref 8–27)
CO2: 29 mmol/L (ref 20–29)
Calcium: 9.3 mg/dL (ref 8.7–10.3)
Chloride: 101 mmol/L (ref 96–106)
Creatinine, Ser: 0.74 mg/dL (ref 0.57–1.00)
GFR calc Af Amer: 91 mL/min/{1.73_m2} (ref >59)
GFR calc non Af Amer: 79 mL/min/{1.73_m2} (ref >59)
Glucose: 73 mg/dL (ref 65–99)
Potassium: 4.2 mmol/L (ref 3.5–5.2)
Sodium: 139 mmol/L (ref 134–144)

## 2019-11-20 DIAGNOSIS — M7581 Other shoulder lesions, right shoulder: Secondary | ICD-10-CM | POA: Diagnosis not present

## 2019-11-20 DIAGNOSIS — M542 Cervicalgia: Secondary | ICD-10-CM | POA: Diagnosis not present

## 2019-11-24 ENCOUNTER — Telehealth (HOSPITAL_COMMUNITY): Payer: Self-pay | Admitting: Emergency Medicine

## 2019-11-24 NOTE — Telephone Encounter (Signed)
Reaching out to patient to offer assistance regarding upcoming cardiac imaging study; pt verbalizes understanding of appt date/time, parking situation and where to check in, pre-test NPO status and medications ordered, and verified current allergies; name and call back number provided for further questions should they arise Mykira Hofmeister RN Navigator Cardiac Imaging [facility] [phone] office [phone] cell 

## 2019-11-25 ENCOUNTER — Ambulatory Visit (HOSPITAL_COMMUNITY)
Admission: RE | Admit: 2019-11-25 | Discharge: 2019-11-25 | Disposition: A | Discharge status: Home / Self Care | Payer: Medicare Other | Source: Ambulatory Visit | Attending: Internal Medicine | Admitting: Internal Medicine

## 2019-11-25 ENCOUNTER — Other Ambulatory Visit: Payer: Self-pay

## 2019-11-25 DIAGNOSIS — I4891 Unspecified atrial fibrillation: Secondary | ICD-10-CM | POA: Insufficient documentation

## 2019-11-25 MED ORDER — IOHEXOL 350 MG/ML SOLN
80.0000 mL | Freq: Once | INTRAVENOUS | Status: AC | PRN
  Administered 2019-11-25: 80 mL via INTRAVENOUS

## 2019-11-26 ENCOUNTER — Ambulatory Visit (HOSPITAL_COMMUNITY): Payer: Medicare Other

## 2019-11-27 ENCOUNTER — Encounter: Payer: Self-pay | Admitting: Neurology

## 2019-11-29 ENCOUNTER — Other Ambulatory Visit (HOSPITAL_COMMUNITY)
Admission: RE | Admit: 2019-11-29 | Discharge: 2019-11-29 | Disposition: A | Discharge status: Home / Self Care | Payer: Medicare Other | Source: Ambulatory Visit | Attending: Internal Medicine | Admitting: Internal Medicine

## 2019-11-29 DIAGNOSIS — Z01812 Encounter for preprocedural laboratory examination: Secondary | ICD-10-CM | POA: Insufficient documentation

## 2019-11-29 DIAGNOSIS — Z20822 Contact with and (suspected) exposure to covid-19: Secondary | ICD-10-CM | POA: Diagnosis not present

## 2019-11-29 LAB — SARS CORONAVIRUS 2 (TAT 6-24 HRS): SARS Coronavirus 2: NEGATIVE

## 2019-12-01 NOTE — Progress Notes (Signed)
Attempted to call patient regarding instructions for procedure for tomorrow.  No answer.  Left voice mail for patient to have nothing to eat or drink after midnight.  Take blood thinner today but don't take in the morning.  Have responsible person to drive her home.

## 2019-12-02 ENCOUNTER — Ambulatory Visit (HOSPITAL_COMMUNITY): Payer: Medicare Other | Admitting: Anesthesiology

## 2019-12-02 ENCOUNTER — Ambulatory Visit (HOSPITAL_COMMUNITY)
Admission: RE | Admit: 2019-12-02 | Discharge: 2019-12-02 | Disposition: A | Discharge status: Home / Self Care | Payer: Medicare Other | Source: Home / Self Care | Attending: Internal Medicine | Admitting: Internal Medicine

## 2019-12-02 ENCOUNTER — Encounter (HOSPITAL_COMMUNITY)
Admission: RE | Disposition: A | Discharge status: Home / Self Care | Payer: Self-pay | Source: Home / Self Care | Attending: Internal Medicine

## 2019-12-02 ENCOUNTER — Other Ambulatory Visit: Payer: Self-pay

## 2019-12-02 DIAGNOSIS — E119 Type 2 diabetes mellitus without complications: Secondary | ICD-10-CM | POA: Insufficient documentation

## 2019-12-02 DIAGNOSIS — Z7901 Long term (current) use of anticoagulants: Secondary | ICD-10-CM | POA: Insufficient documentation

## 2019-12-02 DIAGNOSIS — E785 Hyperlipidemia, unspecified: Secondary | ICD-10-CM | POA: Diagnosis present

## 2019-12-02 DIAGNOSIS — R42 Dizziness and giddiness: Secondary | ICD-10-CM | POA: Insufficient documentation

## 2019-12-02 DIAGNOSIS — N179 Acute kidney failure, unspecified: Secondary | ICD-10-CM | POA: Diagnosis not present

## 2019-12-02 DIAGNOSIS — R5383 Other fatigue: Secondary | ICD-10-CM | POA: Insufficient documentation

## 2019-12-02 DIAGNOSIS — R778 Other specified abnormalities of plasma proteins: Secondary | ICD-10-CM | POA: Diagnosis not present

## 2019-12-02 DIAGNOSIS — E669 Obesity, unspecified: Secondary | ICD-10-CM | POA: Diagnosis present

## 2019-12-02 DIAGNOSIS — J441 Chronic obstructive pulmonary disease with (acute) exacerbation: Secondary | ICD-10-CM | POA: Diagnosis not present

## 2019-12-02 DIAGNOSIS — Z794 Long term (current) use of insulin: Secondary | ICD-10-CM | POA: Insufficient documentation

## 2019-12-02 DIAGNOSIS — Z90710 Acquired absence of both cervix and uterus: Secondary | ICD-10-CM

## 2019-12-02 DIAGNOSIS — I340 Nonrheumatic mitral (valve) insufficiency: Secondary | ICD-10-CM | POA: Insufficient documentation

## 2019-12-02 DIAGNOSIS — Z888 Allergy status to other drugs, medicaments and biological substances status: Secondary | ICD-10-CM | POA: Insufficient documentation

## 2019-12-02 DIAGNOSIS — I10 Essential (primary) hypertension: Secondary | ICD-10-CM | POA: Insufficient documentation

## 2019-12-02 DIAGNOSIS — I4819 Other persistent atrial fibrillation: Principal | ICD-10-CM | POA: Diagnosis present

## 2019-12-02 DIAGNOSIS — Z885 Allergy status to narcotic agent status: Secondary | ICD-10-CM

## 2019-12-02 DIAGNOSIS — Z79899 Other long term (current) drug therapy: Secondary | ICD-10-CM | POA: Insufficient documentation

## 2019-12-02 DIAGNOSIS — Z880 Allergy status to penicillin: Secondary | ICD-10-CM | POA: Insufficient documentation

## 2019-12-02 DIAGNOSIS — Z833 Family history of diabetes mellitus: Secondary | ICD-10-CM

## 2019-12-02 DIAGNOSIS — I484 Atypical atrial flutter: Secondary | ICD-10-CM | POA: Diagnosis not present

## 2019-12-02 DIAGNOSIS — I5033 Acute on chronic diastolic (congestive) heart failure: Secondary | ICD-10-CM | POA: Diagnosis not present

## 2019-12-02 DIAGNOSIS — D6869 Other thrombophilia: Secondary | ICD-10-CM | POA: Diagnosis present

## 2019-12-02 DIAGNOSIS — Z7984 Long term (current) use of oral hypoglycemic drugs: Secondary | ICD-10-CM

## 2019-12-02 DIAGNOSIS — E1165 Type 2 diabetes mellitus with hyperglycemia: Secondary | ICD-10-CM | POA: Diagnosis present

## 2019-12-02 DIAGNOSIS — Z20822 Contact with and (suspected) exposure to covid-19: Secondary | ICD-10-CM | POA: Diagnosis present

## 2019-12-02 DIAGNOSIS — E11649 Type 2 diabetes mellitus with hypoglycemia without coma: Secondary | ICD-10-CM | POA: Diagnosis not present

## 2019-12-02 DIAGNOSIS — Z6830 Body mass index (BMI) 30.0-30.9, adult: Secondary | ICD-10-CM

## 2019-12-02 DIAGNOSIS — Z713 Dietary counseling and surveillance: Secondary | ICD-10-CM

## 2019-12-02 DIAGNOSIS — Z87891 Personal history of nicotine dependence: Secondary | ICD-10-CM

## 2019-12-02 DIAGNOSIS — I110 Hypertensive heart disease with heart failure: Secondary | ICD-10-CM | POA: Diagnosis not present

## 2019-12-02 DIAGNOSIS — Z8249 Family history of ischemic heart disease and other diseases of the circulatory system: Secondary | ICD-10-CM

## 2019-12-02 DIAGNOSIS — J9601 Acute respiratory failure with hypoxia: Secondary | ICD-10-CM | POA: Diagnosis not present

## 2019-12-02 DIAGNOSIS — E872 Acidosis: Secondary | ICD-10-CM | POA: Diagnosis not present

## 2019-12-02 HISTORY — PX: ATRIAL FIBRILLATION ABLATION: EP1191

## 2019-12-02 LAB — POCT ACTIVATED CLOTTING TIME
Activated Clotting Time: 296 seconds
Activated Clotting Time: 312 seconds
Activated Clotting Time: 323 seconds

## 2019-12-02 LAB — GLUCOSE, CAPILLARY
Glucose-Capillary: 74 mg/dL (ref 70–99)
Glucose-Capillary: 229 mg/dL — ABNORMAL HIGH (ref 70–99)

## 2019-12-02 SURGERY — ATRIAL FIBRILLATION ABLATION
Anesthesia: General

## 2019-12-02 MED ORDER — DEXAMETHASONE SODIUM PHOSPHATE 10 MG/ML IJ SOLN
INTRAMUSCULAR | Status: DC | PRN
  Administered 2019-12-02: 10 mg via INTRAVENOUS

## 2019-12-02 MED ORDER — HEPARIN SODIUM (PORCINE) 1000 UNIT/ML IJ SOLN
INTRAMUSCULAR | Status: DC | PRN
  Administered 2019-12-02: 2000 [IU] via INTRAVENOUS
  Administered 2019-12-02: 1000 [IU] via INTRAVENOUS
  Administered 2019-12-02: 2000 [IU] via INTRAVENOUS

## 2019-12-02 MED ORDER — FENTANYL CITRATE (PF) 100 MCG/2ML IJ SOLN
INTRAMUSCULAR | Status: DC | PRN
  Administered 2019-12-02 (×2): 50 ug via INTRAVENOUS

## 2019-12-02 MED ORDER — ACETAMINOPHEN 325 MG PO TABS: 650.0000 mg | ORAL_TABLET | ORAL | Status: DC | PRN

## 2019-12-02 MED ORDER — HEPARIN SODIUM (PORCINE) 1000 UNIT/ML IJ SOLN
INTRAMUSCULAR | Status: DC | PRN
  Administered 2019-12-02: 1000 [IU] via INTRAVENOUS

## 2019-12-02 MED ORDER — HEPARIN (PORCINE) IN NACL 2-0.9 UNITS/ML
INTRAMUSCULAR | Status: AC | PRN
  Administered 2019-12-02: 500 mL

## 2019-12-02 MED ORDER — ONDANSETRON HCL 4 MG/2ML IJ SOLN: 4.0000 mg | Freq: Four times a day (QID) | INTRAMUSCULAR | Status: DC | PRN

## 2019-12-02 MED ORDER — SODIUM CHLORIDE 0.9 % IV SOLN: INTRAVENOUS | Status: DC

## 2019-12-02 MED ORDER — SODIUM CHLORIDE 0.9% FLUSH: 3.0000 mL | INTRAVENOUS | Status: DC | PRN

## 2019-12-02 MED ORDER — LIDOCAINE 2% (20 MG/ML) 5 ML SYRINGE
INTRAMUSCULAR | Status: DC | PRN
  Administered 2019-12-02: 60 mg via INTRAVENOUS

## 2019-12-02 MED ORDER — SODIUM CHLORIDE 0.9 % IV SOLN: 250.0000 mL | INTRAVENOUS | Status: DC | PRN

## 2019-12-02 MED ORDER — PHENYLEPHRINE HCL-NACL 10-0.9 MG/250ML-% IV SOLN
INTRAVENOUS | Status: DC | PRN
  Administered 2019-12-02: 50 ug/min via INTRAVENOUS

## 2019-12-02 MED ORDER — ROCURONIUM BROMIDE 50 MG/5ML IV SOSY
PREFILLED_SYRINGE | INTRAVENOUS | Status: DC | PRN
  Administered 2019-12-02: 60 mg via INTRAVENOUS

## 2019-12-02 MED ORDER — EPHEDRINE SULFATE-NACL 50-0.9 MG/10ML-% IV SOSY
PREFILLED_SYRINGE | INTRAVENOUS | Status: DC | PRN
  Administered 2019-12-02: 5 mg via INTRAVENOUS

## 2019-12-02 MED ORDER — ONDANSETRON HCL 4 MG/2ML IJ SOLN
INTRAMUSCULAR | Status: DC | PRN
  Administered 2019-12-02: 4 mg via INTRAVENOUS

## 2019-12-02 MED ORDER — PROTAMINE SULFATE 10 MG/ML IV SOLN
INTRAVENOUS | Status: DC | PRN
  Administered 2019-12-02: 40 mg via INTRAVENOUS

## 2019-12-02 MED ORDER — PROPOFOL 10 MG/ML IV BOLUS
INTRAVENOUS | Status: DC | PRN
  Administered 2019-12-02: 90 mg via INTRAVENOUS

## 2019-12-02 MED ORDER — SODIUM CHLORIDE 0.9% FLUSH: 3.0000 mL | Freq: Two times a day (BID) | INTRAVENOUS | Status: DC

## 2019-12-02 MED ORDER — APIXABAN 5 MG PO TABS
5.0000 mg | ORAL_TABLET | Freq: Once | ORAL | Status: AC
  Administered 2019-12-02: 5 mg via ORAL
  Filled 2019-12-02: qty 1

## 2019-12-02 MED ORDER — SUGAMMADEX SODIUM 200 MG/2ML IV SOLN
INTRAVENOUS | Status: DC | PRN
  Administered 2019-12-02: 140 mg via INTRAVENOUS

## 2019-12-02 MED ORDER — PANTOPRAZOLE SODIUM 40 MG PO TBEC: 40.0000 mg | DELAYED_RELEASE_TABLET | Freq: Every day | ORAL | 0 refills | Status: DC

## 2019-12-02 SURGICAL SUPPLY — 17 items
CATH MAPPNG PENTARAY F 2-6-2MM (CATHETERS) ×1 IMPLANT
CATH SMTCH THERMOCOOL SF DF (CATHETERS) ×3 IMPLANT
CATH SOUNDSTAR ECO 8FR (CATHETERS) ×3 IMPLANT
CATH WEBSTER BI DIR CS D-F CRV (CATHETERS) ×3 IMPLANT
COVER SWIFTLINK CONNECTOR (BAG) ×3 IMPLANT
DEVICE CLOSURE PERCLS PRGLD 6F (VASCULAR PRODUCTS) ×3 IMPLANT
NEEDLE BAYLIS TRANSSEPTAL 71CM (NEEDLE) ×3 IMPLANT
PACK EP LATEX FREE (CUSTOM PROCEDURE TRAY) ×2
PACK EP LF (CUSTOM PROCEDURE TRAY) ×1 IMPLANT
PAD PRO RADIOLUCENT 2001M-C (PAD) ×3 IMPLANT
PATCH CARTO3 (PAD) ×3 IMPLANT
PENTARAY F 2-6-2MM (CATHETERS) ×3
PERCLOSE PROGLIDE 6F (VASCULAR PRODUCTS) ×9
SHEATH PINNACLE 7F 10CM (SHEATH) ×6 IMPLANT
SHEATH PINNACLE 9F 10CM (SHEATH) ×3 IMPLANT
SHEATH SWARTZ TS SL2 63CM 8.5F (SHEATH) ×3 IMPLANT
TUBING SMART ABLATE COOLFLOW (TUBING) ×3 IMPLANT

## 2019-12-02 NOTE — Discharge Instructions (Signed)
ELIQUIS TONIGHT LATE AS POSSIBLE  Cardiac Ablation, Care After  This sheet gives you information about how to care for yourself after your procedure. Your health care provider may also give you more specific instructions. If you have problems or questions, contact your health care provider. What can I expect after the procedure? After the procedure, it is common to have:  Bruising around your puncture site.  Tenderness around your puncture site.  Skipped heartbeats.  Tiredness (fatigue).  Follow these instructions at home: Puncture site care   Follow instructions from your health care provider about how to take care of your puncture site. Make sure you: ? If present, leave stitches (sutures), skin glue, or adhesive strips in place. These skin closures may need to stay in place for up to 2 weeks. If adhesive strip edges start to loosen and curl up, you may trim the loose edges. Do not remove adhesive strips completely unless your health care provider tells you to do that.  Check your puncture site every day for signs of infection. Check for: ? Redness, swelling, or pain. ? Fluid or blood. If your puncture site starts to bleed, lie down on your back, apply firm pressure to the area, and contact your health care provider. ? Warmth. ? Pus or a bad smell. Driving  Do not drive for at least 4 days after your procedure or however long your health care provider recommends. (Do not resume driving if you have previously been instructed not to drive for other health reasons.)  Do not drive or use heavy machinery while taking prescription pain medicine. Activity  Avoid activities that take a lot of effort for at least 7 days after your procedure.  Do not lift anything that is heavier than 5 lb (4.5 kg) for one week.   No sexual activity for 1 week.   Return to your normal activities as told by your health care provider. Ask your health care provider what activities are safe for  you. General instructions  Take over-the-counter and prescription medicines only as told by your health care provider.  Do not use any products that contain nicotine or tobacco, such as cigarettes and e-cigarettes. If you need help quitting, ask your health care provider.  You may shower after 24 hours, but Do not take baths, swim, or use a hot tub for 1 week.   Do not drink alcohol for 24 hours after your procedure.  Keep all follow-up visits as told by your health care provider. This is important. Contact a health care provider if:  You have redness, mild swelling, or pain around your puncture site.  You have fluid or blood coming from your puncture site that stops after applying firm pressure to the area.  Your puncture site feels warm to the touch.  You have pus or a bad smell coming from your puncture site.  You have a fever.  You have chest pain or discomfort that spreads to your neck, jaw, or arm.  You are sweating a lot.  You feel nauseous.  You have a fast or irregular heartbeat.  You have shortness of breath.  You are dizzy or light-headed and feel the need to lie down.  You have pain or numbness in the arm or leg closest to your puncture site. Get help right away if:  Your puncture site suddenly swells.  Your puncture site is bleeding and the bleeding does not stop after applying firm pressure to the area. These symptoms may represent a  serious problem that is an emergency. Do not wait to see if the symptoms will go away. Get medical help right away. Call your local emergency services (911 in the [place] Do not drive yourself to the hospital. Summary  After the procedure, it is normal to have bruising and tenderness at the puncture site in your groin, neck, or forearm.  Check your puncture site every day for signs of infection.  Get help right away if your puncture site is bleeding and the bleeding does not stop after applying firm pressure to the area. This  is a medical emergency. This information is not intended to replace advice given to you by your health care provider. Make sure you discuss any questions you have with your health care provider.

## 2019-12-02 NOTE — H&P (Signed)
Chief Complaint:  palpitations  History of Present Illness:    Virginia Gilbert is a 79 y.o. female who presents today for afib ablation.  Since last being seen in our clinic, the patient reports doing reasonably well. She has substantial fatigue.  She has poor exercise tolerance and does not feel well.  She attributes this to afib.  She also reports increasing dizziness over the past week.  She reports unsteadiness and a feeling as if she might fall.  She denies presyncope or syncope. Denies any other neuro symptoms.  She does not think her dizziness represents stroke.      Past Medical History:  Diagnosis Date  . Diabetes mellitus without complication ([place])   . Hyperlipidemia   . Hypertension   . Mitral regurgitation    evaluated by TEE 09/2019  . Persistent atrial fibrillation [facility])          Past Surgical History:  Procedure Laterality Date  . BUBBLE STUDY  10/08/2019   Procedure: BUBBLE STUDY;  Surgeon: Elouise Munroe, MD;  Location: [facility] ENDOSCOPY;  Service: Cardiology;;  . CARDIOVERSION N/A 07/08/2019   Procedure: CARDIOVERSION;  Surgeon: Adrian Prows, MD;  Location: [facility] ENDOSCOPY;  Service: Cardiovascular;  Laterality: N/A;  . CARDIOVERSION N/A 09/02/2019   Procedure: CARDIOVERSION;  Surgeon: Adrian Prows, MD;  Location: [place];  Service: Cardiovascular;  Laterality: N/A;  . PARTIAL HYSTERECTOMY     1977  . TEE WITHOUT CARDIOVERSION N/A 10/08/2019   Procedure: TRANSESOPHAGEAL ECHOCARDIOGRAM (TEE);  Surgeon: Elouise Munroe, MD;  Location: [place];  Service: Cardiology;  Laterality: N/A;          Current Outpatient Medications  Medication Sig Dispense Refill  . amiodarone (PACERONE) 200 MG tablet Take 1 tablet (200 mg total) by mouth 2 (two) times daily. 180 tablet 3  . amLODipine (NORVASC) 5 MG tablet Take 5 mg by mouth daily.  5  . apixaban (ELIQUIS) 5 MG TABS tablet Take 1 tablet (5 mg total) by mouth 2 (two) times daily. 180 tablet 1  .  carvedilol (COREG) 3.125 MG tablet Take 3.125 mg by mouth 2 (two) times daily.   12  . Cholecalciferol (VITAMIN D-3) 125 MCG (5000 UT) TABS Take 5,000 Units by mouth daily after lunch.    Marland Kitchen CINNAMON PO Take 1,000 mg by mouth daily.    . Cyanocobalamin (VITAMIN B-12) 2500 MCG SUBL Place 2,500 mcg under the tongue daily with lunch.    . glimepiride (AMARYL) 4 MG tablet Take 4 mg by mouth 2 (two) times daily.   1  . hydrochlorothiazide (HYDRODIURIL) 12.5 MG tablet Take 12.5 mg by mouth daily.    Marland Kitchen losartan (COZAAR) 100 MG tablet Take 100 mg by mouth daily.   6  . metFORMIN (GLUCOPHAGE) 1000 MG tablet Take 1,000 mg by mouth 2 (two) times daily.   6  . Omega-3 Fatty Acids (FISH OIL) 1000 MG CAPS Take 1,000 mg by mouth daily.    . TRESIBA 100 UNIT/ML SOLN Inject 5 Units into the skin daily.    . TRULICITY 1.5 OE/4.2PN SOPN Inject 1.5 mg into the skin every Friday.      No current facility-administered medications for this visit.    Allergies:   Codeine, Penicillins, and Tramadol   Social History:  The patient  reports that she quit smoking about 34 years ago. Her smoking use included cigarettes. She has a 7.50 pack-year smoking history. She has never used smokeless tobacco. She reports that she does not  drink alcohol or use drugs.   ROS:  Please see the history of present illness.   All other systems are personally reviewed and negative.   Physical Exam: Vitals:   12/02/19 0552  BP: (!) 160/82  Pulse: 75  Resp: 14  Temp: (!) 97.2 F (36.2 C)  TempSrc: Skin  SpO2: 97%  Weight: 68.9 kg  Height: 4\' 11"  (1.499 m)    GEN- The patient is elderly and frail appearing, alert and oriented x 3 today.   Head- normocephalic, atraumatic Eyes-  Sclera clear, conjunctiva pink Ears- hearing intact Oropharynx- clear Neck- supple, Lungs-   normal work of breathing Heart- irregular rate and rhythm  GI- soft  Extremities- no clubbing, cyanosis, or edema, groin is without  hematoma/ bruit MS- diffuse atrophy Skin- no rash or lesion   Labs/Other Tests and Data Reviewed:    Recent Labs: 10/06/2019: BUN 19; Creatinine, Ser 0.81; Hemoglobin 12.0; Platelets 226; Potassium 4.1; Sodium 136      Wt Readings from Last 3 Encounters:  10/22/19 157 lb (71.2 kg)  10/21/19 157 lb 12.8 oz (71.6 kg)  10/08/19 155 lb (70.3 kg)     TEE is reviewed with Dr 10/10/19 and discussed with Dr Annamaria Helling  ASSESSMENT & PLAN:    1.  Persistent afib The patient has symptomatic, recurrent persistent atrial fibrillation. she has failed medical therapy with amiodarone. Chads2vasc score is 4.  she is anticoagulated with eliquis .  She reports compliance with this medicine.  Cardiac CT reviewed with patient today at length. Risk, benefits, and alternatives to EP study and radiofrequency ablation for afib were also discussed in detail today. These risks include but are not limited to stroke, bleeding, vascular damage, tamponade, perforation, damage to the esophagus, lungs, and other structures, pulmonary vein stenosis, worsening renal function, and death. The patient understands these risk and wishes to proceed.  Cornelius Moras MD, [facility] 12/02/2019 7:26 AM

## 2019-12-02 NOTE — Transfer of Care (Signed)
Immediate Anesthesia Transfer of Care Note  Patient: Virginia Gilbert  Procedure(s) Performed: ATRIAL FIBRILLATION ABLATION (N/A )  Patient Location: Cath Lab  Anesthesia Type:General  Level of Consciousness: drowsy and patient cooperative  Airway & Oxygen Therapy: Patient Spontanous Breathing and Patient connected to nasal cannula oxygen  Post-op Assessment: Report given to RN and Post -op Vital signs reviewed and stable  Post vital signs: Reviewed and stable  Last Vitals:  Vitals Value Taken Time  BP    Temp    Pulse 64 12/02/19 1033  Resp 13 12/02/19 1033  SpO2 94 % 12/02/19 1033  Vitals shown include unvalidated device data.  Last Pain:  Vitals:   12/02/19 0559  TempSrc:   PainSc: 0-No pain      Patients Stated Pain Goal: 3 (12/02/19 0559)  Complications: No apparent anesthesia complications

## 2019-12-02 NOTE — Anesthesia Preprocedure Evaluation (Addendum)
Anesthesia Evaluation  Patient identified by MRN, date of birth, ID band Patient awake    Reviewed: Allergy & Precautions, NPO status , Patient's Chart, lab work & pertinent test results, reviewed documented beta blocker date and time   History of Anesthesia Complications Negative for: history of anesthetic complications  Airway Mallampati: II  TM Distance: >3 FB Neck ROM: Full    Dental  (+) Upper Dentures, Lower Dentures   Pulmonary former smoker,    Pulmonary exam normal        Cardiovascular hypertension, Pt. on medications and Pt. on home beta blockers + dysrhythmias Atrial Fibrillation  Rhythm:Irregular Rate:Normal   '21 TEE - EF 60 to 65%. LA was moderate-severely dilated. The mitral valve has trivial degenerative change. Mild MR. 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   '20 Myoperfusion - Resting EKG atrial fibrillation, low voltage, nonspecific T abnormality.  Stress EKG no change.  Stress symptoms included neck pain, dyspnea, dizziness, & stomach pain. Myocardial perfusion imaging is normal. Left ventricular ejection fraction is  63% with normal wall motion. Low risk study.    Neuro/Psych negative neurological ROS  negative psych ROS   GI/Hepatic negative GI ROS, Neg liver ROS,   Endo/Other  diabetes, Type 2, Oral Hypoglycemic Agents Obesity   Renal/GU negative Renal ROS     Musculoskeletal negative musculoskeletal ROS (+)   Abdominal   Peds  Hematology  On eliquis    Anesthesia Other Findings Covid neg 4/17   Reproductive/Obstetrics                           Anesthesia Physical Anesthesia Plan  ASA: III  Anesthesia Plan: General   Post-op Pain Management:    Induction: Intravenous  PONV Risk Score and Plan: 3 and Treatment may vary due to age or medical condition, Ondansetron and Dexamethasone  Airway  Management Planned: Oral ETT  Additional Equipment: None  Intra-op Plan:   Post-operative Plan: Extubation in OR  Informed Consent: I have reviewed the patients History and Physical, chart, labs and discussed the procedure including the risks, benefits and alternatives for the proposed anesthesia with the patient or authorized representative who has indicated his/her understanding and acceptance.     Dental advisory given  Plan Discussed with: CRNA and Anesthesiologist  Anesthesia Plan Comments:        Anesthesia Quick Evaluation

## 2019-12-02 NOTE — Anesthesia Procedure Notes (Signed)
Procedure Name: Intubation Date/Time: 12/02/2019 7:49 AM Performed by: Rosiland Oz, CRNA Pre-anesthesia Checklist: Patient identified, Emergency Drugs available, Suction available, Patient being monitored and Timeout performed Patient Re-evaluated:Patient Re-evaluated prior to induction Oxygen Delivery Method: Circle system utilized Preoxygenation: Pre-oxygenation with 100% oxygen Induction Type: IV induction Ventilation: Mask ventilation without difficulty Laryngoscope Size: Miller and 2 Grade View: Grade I Tube type: Oral Tube size: 7.0 mm Number of attempts: 1 Airway Equipment and Method: Stylet Placement Confirmation: ETT inserted through vocal cords under direct vision,  positive ETCO2 and breath sounds checked- equal and bilateral Secured at: 19 cm Tube secured with: Tape Dental Injury: Teeth and Oropharynx as per pre-operative assessment

## 2019-12-02 NOTE — Progress Notes (Signed)
Arline Asp called (pt daughter) updated on possible discharge time. Message left Dr. Johney Frame will reassess at 1700 and I will call her back when I know something more about d/c time.

## 2019-12-02 NOTE — Progress Notes (Signed)
Ambulated in room tol well no bleeding noted before or after ambulation

## 2019-12-02 NOTE — Progress Notes (Signed)
Pt daughter Virginia Gilbert called and updated Dr Johney Frame states pt can go at 1800. Also informed Virginia Gilbert to let Ms Leone take Eliquis tonight right before bed as instructed by Dr Johney Frame. Voices understanding.

## 2019-12-02 NOTE — Anesthesia Postprocedure Evaluation (Signed)
Anesthesia Post Note  Patient: Virginia Gilbert  Procedure(s) Performed: ATRIAL FIBRILLATION ABLATION (N/A )     Patient location during evaluation: PACU Anesthesia Type: General Level of consciousness: awake and alert Pain management: pain level controlled Vital Signs Assessment: post-procedure vital signs reviewed and stable Respiratory status: spontaneous breathing, nonlabored ventilation and respiratory function stable Cardiovascular status: blood pressure returned to baseline and stable Postop Assessment: no apparent nausea or vomiting Anesthetic complications: no    Last Vitals:  Vitals:   12/02/19 1200 12/02/19 1215  BP: (!) 130/51 (!) 136/55  Pulse: 62 63  Resp: 15 (!) 24  Temp:    SpO2: 92% 93%    Last Pain:  Vitals:   12/02/19 1157  TempSrc:   PainSc: 0-No pain                 Beryle Lathe

## 2019-12-03 ENCOUNTER — Ambulatory Visit: Payer: Medicare Other | Admitting: Internal Medicine

## 2019-12-04 ENCOUNTER — Emergency Department (HOSPITAL_COMMUNITY): Payer: Medicare Other

## 2019-12-04 ENCOUNTER — Encounter (HOSPITAL_COMMUNITY): Payer: Self-pay | Admitting: Internal Medicine

## 2019-12-04 ENCOUNTER — Inpatient Hospital Stay (HOSPITAL_COMMUNITY)
Admission: EM | Admit: 2019-12-04 | Discharge: 2019-12-07 | DRG: 273 | Disposition: A | Discharge status: Home / Self Care | Payer: Medicare Other | Attending: Internal Medicine | Admitting: Internal Medicine

## 2019-12-04 ENCOUNTER — Inpatient Hospital Stay (HOSPITAL_COMMUNITY): Payer: Medicare Other

## 2019-12-04 ENCOUNTER — Other Ambulatory Visit: Payer: Self-pay

## 2019-12-04 DIAGNOSIS — N179 Acute kidney failure, unspecified: Secondary | ICD-10-CM | POA: Diagnosis not present

## 2019-12-04 DIAGNOSIS — Z90710 Acquired absence of both cervix and uterus: Secondary | ICD-10-CM | POA: Diagnosis not present

## 2019-12-04 DIAGNOSIS — E11649 Type 2 diabetes mellitus with hypoglycemia without coma: Secondary | ICD-10-CM | POA: Diagnosis not present

## 2019-12-04 DIAGNOSIS — Z743 Need for continuous supervision: Secondary | ICD-10-CM | POA: Diagnosis not present

## 2019-12-04 DIAGNOSIS — I361 Nonrheumatic tricuspid (valve) insufficiency: Secondary | ICD-10-CM | POA: Diagnosis not present

## 2019-12-04 DIAGNOSIS — Z79899 Other long term (current) drug therapy: Secondary | ICD-10-CM | POA: Diagnosis not present

## 2019-12-04 DIAGNOSIS — R05 Cough: Secondary | ICD-10-CM | POA: Diagnosis not present

## 2019-12-04 DIAGNOSIS — D6869 Other thrombophilia: Secondary | ICD-10-CM | POA: Diagnosis not present

## 2019-12-04 DIAGNOSIS — I5031 Acute diastolic (congestive) heart failure: Secondary | ICD-10-CM | POA: Diagnosis not present

## 2019-12-04 DIAGNOSIS — Z833 Family history of diabetes mellitus: Secondary | ICD-10-CM | POA: Diagnosis not present

## 2019-12-04 DIAGNOSIS — E872 Acidosis: Secondary | ICD-10-CM | POA: Diagnosis not present

## 2019-12-04 DIAGNOSIS — I5033 Acute on chronic diastolic (congestive) heart failure: Secondary | ICD-10-CM | POA: Diagnosis not present

## 2019-12-04 DIAGNOSIS — I110 Hypertensive heart disease with heart failure: Secondary | ICD-10-CM | POA: Diagnosis not present

## 2019-12-04 DIAGNOSIS — J189 Pneumonia, unspecified organism: Secondary | ICD-10-CM | POA: Diagnosis present

## 2019-12-04 DIAGNOSIS — R918 Other nonspecific abnormal finding of lung field: Secondary | ICD-10-CM | POA: Diagnosis not present

## 2019-12-04 DIAGNOSIS — Z7901 Long term (current) use of anticoagulants: Secondary | ICD-10-CM | POA: Diagnosis not present

## 2019-12-04 DIAGNOSIS — J9601 Acute respiratory failure with hypoxia: Secondary | ICD-10-CM | POA: Diagnosis not present

## 2019-12-04 DIAGNOSIS — R0689 Other abnormalities of breathing: Secondary | ICD-10-CM | POA: Diagnosis not present

## 2019-12-04 DIAGNOSIS — Z7984 Long term (current) use of oral hypoglycemic drugs: Secondary | ICD-10-CM | POA: Diagnosis not present

## 2019-12-04 DIAGNOSIS — R0602 Shortness of breath: Secondary | ICD-10-CM | POA: Diagnosis not present

## 2019-12-04 DIAGNOSIS — I484 Atypical atrial flutter: Secondary | ICD-10-CM

## 2019-12-04 DIAGNOSIS — Z8249 Family history of ischemic heart disease and other diseases of the circulatory system: Secondary | ICD-10-CM | POA: Diagnosis not present

## 2019-12-04 DIAGNOSIS — J984 Other disorders of lung: Secondary | ICD-10-CM | POA: Diagnosis not present

## 2019-12-04 DIAGNOSIS — R069 Unspecified abnormalities of breathing: Secondary | ICD-10-CM | POA: Diagnosis not present

## 2019-12-04 DIAGNOSIS — R778 Other specified abnormalities of plasma proteins: Secondary | ICD-10-CM | POA: Diagnosis not present

## 2019-12-04 DIAGNOSIS — E1165 Type 2 diabetes mellitus with hyperglycemia: Secondary | ICD-10-CM | POA: Diagnosis not present

## 2019-12-04 DIAGNOSIS — D696 Thrombocytopenia, unspecified: Secondary | ICD-10-CM

## 2019-12-04 DIAGNOSIS — Z885 Allergy status to narcotic agent status: Secondary | ICD-10-CM | POA: Diagnosis not present

## 2019-12-04 DIAGNOSIS — E785 Hyperlipidemia, unspecified: Secondary | ICD-10-CM | POA: Diagnosis not present

## 2019-12-04 DIAGNOSIS — E669 Obesity, unspecified: Secondary | ICD-10-CM | POA: Diagnosis present

## 2019-12-04 DIAGNOSIS — Z20822 Contact with and (suspected) exposure to covid-19: Secondary | ICD-10-CM | POA: Diagnosis not present

## 2019-12-04 DIAGNOSIS — I340 Nonrheumatic mitral (valve) insufficiency: Secondary | ICD-10-CM | POA: Diagnosis not present

## 2019-12-04 DIAGNOSIS — J441 Chronic obstructive pulmonary disease with (acute) exacerbation: Secondary | ICD-10-CM | POA: Diagnosis not present

## 2019-12-04 DIAGNOSIS — Z87891 Personal history of nicotine dependence: Secondary | ICD-10-CM | POA: Diagnosis not present

## 2019-12-04 DIAGNOSIS — I5023 Acute on chronic systolic (congestive) heart failure: Secondary | ICD-10-CM | POA: Diagnosis not present

## 2019-12-04 DIAGNOSIS — J9600 Acute respiratory failure, unspecified whether with hypoxia or hypercapnia: Secondary | ICD-10-CM | POA: Insufficient documentation

## 2019-12-04 DIAGNOSIS — I4819 Other persistent atrial fibrillation: Secondary | ICD-10-CM | POA: Diagnosis not present

## 2019-12-04 LAB — CBC
HCT: 34.3 % — ABNORMAL LOW (ref 36.0–46.0)
Hemoglobin: 10 g/dL — ABNORMAL LOW (ref 12.0–15.0)
MCH: 28.1 pg (ref 26.0–34.0)
MCHC: 29.2 g/dL — ABNORMAL LOW (ref 30.0–36.0)
MCV: 96.3 fL (ref 80.0–100.0)
Platelets: 141 10*3/uL — ABNORMAL LOW (ref 150–400)
RBC: 3.56 MIL/uL — ABNORMAL LOW (ref 3.87–5.11)
RDW: 15.2 % (ref 11.5–15.5)
WBC: 15.1 10*3/uL — ABNORMAL HIGH (ref 4.0–10.5)
nRBC: 0 % (ref 0.0–0.2)

## 2019-12-04 LAB — COMPREHENSIVE METABOLIC PANEL
ALT: 41 U/L (ref 0–44)
AST: 39 U/L (ref 15–41)
Albumin: 3.1 g/dL — ABNORMAL LOW (ref 3.5–5.0)
Alkaline Phosphatase: 47 U/L (ref 38–126)
Anion gap: 12 (ref 5–15)
BUN: 29 mg/dL — ABNORMAL HIGH (ref 8–23)
CO2: 22 mmol/L (ref 22–32)
Calcium: 8.2 mg/dL — ABNORMAL LOW (ref 8.9–10.3)
Chloride: 100 mmol/L (ref 98–111)
Creatinine, Ser: 1.04 mg/dL — ABNORMAL HIGH (ref 0.44–1.00)
GFR calc Af Amer: >60 mL/min (ref >60)
GFR calc non Af Amer: 52 mL/min — ABNORMAL LOW (ref >60)
Glucose, Bld: 229 mg/dL — ABNORMAL HIGH (ref 70–99)
Potassium: 4.2 mmol/L (ref 3.5–5.1)
Sodium: 134 mmol/L — ABNORMAL LOW (ref 135–145)
Total Bilirubin: 1 mg/dL (ref 0.3–1.2)
Total Protein: 5.5 g/dL — ABNORMAL LOW (ref 6.5–8.1)

## 2019-12-04 LAB — URINALYSIS, ROUTINE W REFLEX MICROSCOPIC
Bilirubin Urine: NEGATIVE
Glucose, UA: NEGATIVE mg/dL
Hgb urine dipstick: NEGATIVE
Ketones, ur: NEGATIVE mg/dL
Leukocytes,Ua: NEGATIVE
Nitrite: NEGATIVE
Protein, ur: NEGATIVE mg/dL
Specific Gravity, Urine: 1.023 (ref 1.005–1.030)
pH: 5 (ref 5.0–8.0)

## 2019-12-04 LAB — LACTIC ACID, PLASMA
Lactic Acid, Venous: 2 mmol/L (ref 0.5–1.9)
Lactic Acid, Venous: 2.4 mmol/L (ref 0.5–1.9)

## 2019-12-04 LAB — TROPONIN I (HIGH SENSITIVITY)
Troponin I (High Sensitivity): 355 ng/L (ref <18)
Troponin I (High Sensitivity): 310 ng/L (ref <18)

## 2019-12-04 LAB — HEMOGLOBIN A1C
Hgb A1c MFr Bld: 6.9 % — ABNORMAL HIGH (ref 4.8–5.6)
Mean Plasma Glucose: 151.33 mg/dL

## 2019-12-04 LAB — D-DIMER, QUANTITATIVE: D-Dimer, Quant: 0.42 ug/mL-FEU (ref 0.00–0.50)

## 2019-12-04 LAB — GLUCOSE, CAPILLARY: Glucose-Capillary: 242 mg/dL — ABNORMAL HIGH (ref 70–99)

## 2019-12-04 LAB — HIV ANTIBODY (ROUTINE TESTING W REFLEX): HIV Screen 4th Generation wRfx: NONREACTIVE

## 2019-12-04 LAB — RESPIRATORY PANEL BY RT PCR (FLU A&B, COVID)
Influenza A by PCR: NEGATIVE
Influenza B by PCR: NEGATIVE
SARS Coronavirus 2 by RT PCR: NEGATIVE

## 2019-12-04 LAB — BRAIN NATRIURETIC PEPTIDE: B Natriuretic Peptide: 217.9 pg/mL — ABNORMAL HIGH (ref 0.0–100.0)

## 2019-12-04 MED ORDER — LOSARTAN POTASSIUM 50 MG PO TABS
100.0000 mg | ORAL_TABLET | Freq: Every day | ORAL | Status: DC
  Administered 2019-12-05 – 2019-12-07 (×3): 100 mg via ORAL
  Filled 2019-12-04 (×3): qty 2

## 2019-12-04 MED ORDER — SODIUM CHLORIDE 0.9 % IV SOLN
500.0000 mg | INTRAVENOUS | Status: DC
  Administered 2019-12-04: 15:00:00 500 mg via INTRAVENOUS
  Filled 2019-12-04: qty 500

## 2019-12-04 MED ORDER — SODIUM CHLORIDE 0.9 % IV SOLN
2.0000 g | INTRAVENOUS | Status: DC
  Administered 2019-12-04: 2 g via INTRAVENOUS
  Filled 2019-12-04: qty 20

## 2019-12-04 MED ORDER — HYDROCHLOROTHIAZIDE 25 MG PO TABS: 12.5000 mg | ORAL_TABLET | Freq: Every day | ORAL | Status: DC

## 2019-12-04 MED ORDER — AZITHROMYCIN 250 MG PO TABS
500.0000 mg | ORAL_TABLET | Freq: Every day | ORAL | Status: DC
  Administered 2019-12-05 – 2019-12-06 (×2): 500 mg via ORAL
  Filled 2019-12-04 (×2): qty 2

## 2019-12-04 MED ORDER — SODIUM CHLORIDE 0.9 % IV BOLUS: 1000.0000 mL | Freq: Once | INTRAVENOUS | Status: DC

## 2019-12-04 MED ORDER — IBUPROFEN 200 MG PO TABS: 400.0000 mg | ORAL_TABLET | Freq: Four times a day (QID) | ORAL | Status: DC | PRN

## 2019-12-04 MED ORDER — FUROSEMIDE 10 MG/ML IJ SOLN
20.0000 mg | Freq: Once | INTRAMUSCULAR | Status: AC
  Administered 2019-12-04: 20 mg via INTRAVENOUS
  Filled 2019-12-04: qty 2

## 2019-12-04 MED ORDER — EZETIMIBE 10 MG PO TABS
10.0000 mg | ORAL_TABLET | Freq: Every day | ORAL | Status: DC
  Administered 2019-12-05 – 2019-12-07 (×3): 10 mg via ORAL
  Filled 2019-12-04 (×4): qty 1

## 2019-12-04 MED ORDER — AMLODIPINE BESYLATE 5 MG PO TABS: 5.0000 mg | ORAL_TABLET | Freq: Every day | ORAL | Status: DC

## 2019-12-04 MED ORDER — APIXABAN 5 MG PO TABS
5.0000 mg | ORAL_TABLET | Freq: Two times a day (BID) | ORAL | Status: DC
  Administered 2019-12-04 – 2019-12-07 (×6): 5 mg via ORAL
  Filled 2019-12-04 (×7): qty 1

## 2019-12-04 MED ORDER — HYDRALAZINE HCL 25 MG PO TABS: 25.0000 mg | ORAL_TABLET | Freq: Four times a day (QID) | ORAL | Status: DC | PRN

## 2019-12-04 MED ORDER — SODIUM CHLORIDE 0.9 % IV SOLN
1.0000 g | INTRAVENOUS | Status: DC
  Administered 2019-12-05 – 2019-12-06 (×2): 1 g via INTRAVENOUS
  Filled 2019-12-04 (×2): qty 1

## 2019-12-04 MED ORDER — PREDNISONE 20 MG PO TABS
40.0000 mg | ORAL_TABLET | Freq: Every day | ORAL | Status: DC
  Administered 2019-12-04 – 2019-12-05 (×2): 40 mg via ORAL
  Filled 2019-12-04 (×2): qty 2

## 2019-12-04 MED ORDER — INSULIN ASPART 100 UNIT/ML ~~LOC~~ SOLN
0.0000 [IU] | Freq: Three times a day (TID) | SUBCUTANEOUS | Status: DC
  Administered 2019-12-05: 5 [IU] via SUBCUTANEOUS
  Administered 2019-12-05: 9 [IU] via SUBCUTANEOUS
  Administered 2019-12-05: 2 [IU] via SUBCUTANEOUS
  Administered 2019-12-06: 5 [IU] via SUBCUTANEOUS
  Administered 2019-12-06: 2 [IU] via SUBCUTANEOUS
  Administered 2019-12-06: 5 [IU] via SUBCUTANEOUS
  Administered 2019-12-07: 3 [IU] via SUBCUTANEOUS

## 2019-12-04 MED ORDER — SODIUM CHLORIDE 0.9 % IV BOLUS
1000.0000 mL | Freq: Once | INTRAVENOUS | Status: AC
  Administered 2019-12-04: 17:00:00 1000 mL via INTRAVENOUS

## 2019-12-04 MED ORDER — INSULIN ASPART 100 UNIT/ML ~~LOC~~ SOLN
0.0000 [IU] | Freq: Every day | SUBCUTANEOUS | Status: DC
  Administered 2019-12-04: 2 [IU] via SUBCUTANEOUS

## 2019-12-04 MED ORDER — SODIUM CHLORIDE 0.9 % IV SOLN: INTRAVENOUS | Status: DC

## 2019-12-04 MED ORDER — ACETAMINOPHEN 650 MG RE SUPP: 650.0000 mg | Freq: Four times a day (QID) | RECTAL | Status: DC | PRN

## 2019-12-04 MED ORDER — MOMETASONE FURO-FORMOTEROL FUM 200-5 MCG/ACT IN AERO
1.0000 | INHALATION_SPRAY | Freq: Two times a day (BID) | RESPIRATORY_TRACT | Status: DC
  Administered 2019-12-04 – 2019-12-07 (×6): 1 via RESPIRATORY_TRACT
  Filled 2019-12-04: qty 8.8

## 2019-12-04 MED ORDER — VITAMIN B-12 1000 MCG PO TABS
2500.0000 ug | ORAL_TABLET | Freq: Every day | ORAL | Status: DC
  Administered 2019-12-05 – 2019-12-07 (×3): 2500 ug via ORAL
  Filled 2019-12-04 (×3): qty 3

## 2019-12-04 MED ORDER — ACETAMINOPHEN 325 MG PO TABS: 650.0000 mg | ORAL_TABLET | Freq: Four times a day (QID) | ORAL | Status: DC | PRN

## 2019-12-04 MED ORDER — OMEGA-3-ACID ETHYL ESTERS 1 G PO CAPS
1.0000 g | ORAL_CAPSULE | Freq: Every day | ORAL | Status: DC
  Administered 2019-12-05 – 2019-12-07 (×3): 1 g via ORAL
  Filled 2019-12-04 (×4): qty 1

## 2019-12-04 MED ORDER — PANTOPRAZOLE SODIUM 40 MG PO TBEC
40.0000 mg | DELAYED_RELEASE_TABLET | Freq: Every day | ORAL | Status: DC
  Administered 2019-12-05 – 2019-12-07 (×3): 40 mg via ORAL
  Filled 2019-12-04 (×4): qty 1

## 2019-12-04 MED ORDER — VITAMIN D 25 MCG (1000 UNIT) PO TABS
5000.0000 [IU] | ORAL_TABLET | Freq: Every day | ORAL | Status: DC
  Administered 2019-12-05 – 2019-12-07 (×3): 5000 [IU] via ORAL
  Filled 2019-12-04 (×3): qty 5

## 2019-12-04 MED ORDER — IPRATROPIUM BROMIDE 0.02 % IN SOLN
0.5000 mg | RESPIRATORY_TRACT | Status: DC
  Administered 2019-12-04 – 2019-12-05 (×3): 0.5 mg via RESPIRATORY_TRACT
  Filled 2019-12-04 (×5): qty 2.5

## 2019-12-04 MED ORDER — CARVEDILOL 3.125 MG PO TABS
3.1250 mg | ORAL_TABLET | Freq: Two times a day (BID) | ORAL | Status: DC
  Administered 2019-12-04 – 2019-12-07 (×6): 3.125 mg via ORAL
  Filled 2019-12-04 (×7): qty 1

## 2019-12-04 NOTE — ED Provider Notes (Signed)
[facility] EMERGENCY DEPARTMENT Provider Note   CSN: [id number] Arrival date & time: 12/04/19  1011     History Chief Complaint  Patient presents with  . Shortness of Breath    Virginia Gilbert is a 79 y.o. female.  Patient with hx a fib, dm, c/o sob for the past day. Symptoms acute onset, mod-severe, constant, persistent, worse today, worse w exertion, improved on o2. States had an ablation procedure 2 days ago, had covid test prior to that, negative. +non prod cough in past 1-2 days. No sore throat or runny nose. No known covid exposure, has not received vaccination. No hx cad, chf, or dvt/pe. No leg pain or swelling. Denies fever/chills. No chest pain or discomfort. No change in meds.   The history is provided by the patient.  Shortness of Breath Associated symptoms: cough   Associated symptoms: no abdominal pain, no chest pain, no fever, no headaches, no neck pain, no rash, no sore throat and no vomiting        Past Medical History:  Diagnosis Date  . Diabetes mellitus without complication (HCC)   . Hyperlipidemia   . Hypertension   . Mitral regurgitation    evaluated by TEE 09/2019  . Persistent atrial fibrillation [facility])     Patient Active Problem List   Diagnosis Date Noted  . Nonrheumatic mitral valve regurgitation   . Secondary hypercoagulable state (HCC) 09/22/2019  . HTN (hypertension) 07/24/2019  . Atrial fibrillation (HCC) 07/24/2019    Past Surgical History:  Procedure Laterality Date  . ATRIAL FIBRILLATION ABLATION N/A 12/02/2019   Procedure: ATRIAL FIBRILLATION ABLATION;  Surgeon: Hillis Range, MD;  Location: [facility] INVASIVE CV LAB;  Service: Cardiovascular;  Laterality: N/A;  . BUBBLE STUDY  10/08/2019   Procedure: BUBBLE STUDY;  Surgeon: [name], MD;  Location: [facility] ENDOSCOPY;  Service: Cardiology;;  . CARDIOVERSION N/A 07/08/2019   Procedure: CARDIOVERSION;  Surgeon: Yates Decamp, MD;  Location: [facility] ENDOSCOPY;  Service:  Cardiovascular;  Laterality: N/A;  . CARDIOVERSION N/A 09/02/2019   Procedure: CARDIOVERSION;  Surgeon: Yates Decamp, MD;  Location: [facility] ENDOSCOPY;  Service: Cardiovascular;  Laterality: N/A;  . PARTIAL HYSTERECTOMY     1975  . TEE WITHOUT CARDIOVERSION N/A 10/08/2019   Procedure: TRANSESOPHAGEAL ECHOCARDIOGRAM (TEE);  Surgeon: [name], MD;  Location: [facility] ENDOSCOPY;  Service: Cardiology;  Laterality: N/A;     OB History   No obstetric history on file.     Family History  Problem Relation Age of Onset  . Heart disease Mother   . Diabetes Mother   . Emphysema Father   . Emphysema Brother   . Heart disease Brother   . Diabetes Brother   . Heart disease Brother     Social History   Tobacco Use  . Smoking status: Former Smoker    Packs/day: 0.50    Years: 15.00    Pack years: 7.50    Types: Cigarettes    Quit date: 05/14/1985    Years since quitting: 34.5  . Smokeless tobacco: Never Used  Substance Use Topics  . Alcohol use: No    Alcohol/week: 0.0 standard drinks  . Drug use: Never    Home Medications Prior to Admission medications   Medication Sig Start Date End Date Taking? Authorizing Provider  amLODipine (NORVASC) 5 MG tablet Take 5 mg by mouth daily. 05/10/18   [provider]  apixaban (ELIQUIS) 5 MG TABS tablet Take 1 tablet (5 mg total) by mouth 2 (  two) times daily. 10/21/19   [name], MD  carvedilol (COREG) 3.125 MG tablet Take 3.125 mg by mouth 2 (two) times daily.  04/18/18   [provider]  Cholecalciferol (VITAMIN D-3) 125 MCG (5000 UT) TABS Take 5,000 Units by mouth daily after lunch.    [provider]  CINNAMON PO Take 1,000 mg by mouth daily.    [provider]  Cyanocobalamin (VITAMIN B-12) 2500 MCG SUBL Place 2,500 mcg under the tongue daily with lunch.    [provider]  ezetimibe (ZETIA) 10 MG tablet Take 10 mg by mouth daily.    [provider]  glimepiride (AMARYL) 4 MG tablet Take  4 mg by mouth 2 (two) times daily.     [provider]  hydrochlorothiazide (HYDRODIURIL) 12.5 MG tablet Take 12.5 mg by mouth daily.    [provider]  losartan (COZAAR) 100 MG tablet Take 100 mg by mouth daily.  03/06/15   [provider]  metFORMIN (GLUCOPHAGE) 1000 MG tablet Take 1,000 mg by mouth 2 (two) times daily.  02/26/15   [provider]  Omega-3 Fatty Acids (FISH OIL) 1000 MG CAPS Take 1,000 mg by mouth daily.    [provider]  pantoprazole (PROTONIX) 40 MG tablet Take 1 tablet (40 mg total) by mouth daily. 12/02/19 01/16/20  Allred, Fayrene Fearing, MD  TRESIBA 100 UNIT/ML SOLN Inject 5 Units into the skin daily.    [provider]  TRULICITY 1.5 MG/0.5ML SOPN Inject 1.5 mg into the skin every Friday.  05/29/18   [provider]    Allergies    Codeine, Penicillins, and Tramadol  Review of Systems   Review of Systems  Constitutional: Negative for chills and fever.  HENT: Negative for sore throat.   Eyes: Negative for visual disturbance.  Respiratory: Positive for cough and shortness of breath.   Cardiovascular: Negative for chest pain, palpitations and leg swelling.  Gastrointestinal: Negative for abdominal pain and vomiting.  Endocrine: Negative for polyuria.  Genitourinary: Negative for dysuria and flank pain.  Musculoskeletal: Negative for back pain and neck pain.  Skin: Negative for rash.  Neurological: Negative for headaches.  Hematological: Does not bruise/bleed easily.  Psychiatric/Behavioral: Negative for confusion.    Physical Exam Updated Vital Signs BP (!) 125/56 (BP Location: Right Arm)   Pulse 85   Temp 99 F (37.2 C) (Oral)   Resp (!) 21   Ht 1.499 m (4\' 11" )   Wt 68.9 kg   SpO2 94%   BMI 30.68 kg/m   Physical Exam Vitals and nursing note reviewed.  Constitutional:      Appearance: Normal appearance. She is well-developed.  HENT:     Head: Atraumatic.     Nose: Nose normal.      Mouth/Throat:     Mouth: Mucous membranes are moist.  Eyes:     General: No scleral icterus.    Conjunctiva/sclera: Conjunctivae normal.     Pupils: Pupils are equal, round, and reactive to light.  Neck:     Trachea: No tracheal deviation.  Cardiovascular:     Rate and Rhythm: Normal rate and regular rhythm.     Pulses: Normal pulses.     Heart sounds: Normal heart sounds. No murmur. No friction rub. No gallop.   Pulmonary:     Effort: Pulmonary effort is normal.     Breath sounds: Rhonchi and rales present.  Abdominal:     General: Bowel sounds are normal. There is no  distension.     Palpations: Abdomen is soft.     Tenderness: There is no abdominal tenderness. There is no guarding.  Genitourinary:    Comments: No cva tenderness.  Musculoskeletal:        General: No swelling or tenderness.     Cervical back: Normal range of motion and neck supple. No rigidity. No muscular tenderness.     Right lower leg: No edema.     Left lower leg: No edema.  Skin:    General: Skin is warm and dry.     Findings: No rash.  Neurological:     Mental Status: She is alert.     Comments: Alert, speech normal.   Psychiatric:        Mood and Affect: Mood normal.     ED Results / Procedures / Treatments   Labs (all labs ordered are listed, but only abnormal results are displayed) Results for orders placed or performed during the hospital encounter of 12/04/19  D-dimer, quantitative (not at [facility])  Result Value Ref Range   D-Dimer, Quant 0.42 0.00 - 0.50 ug/mL-FEU  CBC  Result Value Ref Range   WBC 15.1 (H) 4.0 - 10.5 K/uL   RBC 3.56 (L) 3.87 - 5.11 MIL/uL   Hemoglobin 10.0 (L) 12.0 - 15.0 g/dL   HCT 84.1 (L) 66.0 - 63.0 %   MCV 96.3 80.0 - 100.0 fL   MCH 28.1 26.0 - 34.0 pg   MCHC 29.2 (L) 30.0 - 36.0 g/dL   RDW 16.0 10.9 - 32.3 %   Platelets 141 (L) 150 - 400 K/uL   nRBC 0.0 0.0 - 0.2 %  Comprehensive metabolic panel  Result Value Ref Range   Sodium 134 (L) 135 - 145 mmol/L    Potassium 4.2 3.5 - 5.1 mmol/L   Chloride 100 98 - 111 mmol/L   CO2 22 22 - 32 mmol/L   Glucose, Bld 229 (H) 70 - 99 mg/dL   BUN 29 (H) 8 - 23 mg/dL   Creatinine, Ser 5.57 (H) 0.44 - 1.00 mg/dL   Calcium 8.2 (L) 8.9 - 10.3 mg/dL   Total Protein 5.5 (L) 6.5 - 8.1 g/dL   Albumin 3.1 (L) 3.5 - 5.0 g/dL   AST 39 15 - 41 U/L   ALT 41 0 - 44 U/L   Alkaline Phosphatase 47 38 - 126 U/L   Total Bilirubin 1.0 0.3 - 1.2 mg/dL   GFR calc non Af Amer 52 (L) >60 mL/min   GFR calc Af Amer >60 >60 mL/min   Anion gap 12 5 - 15  Troponin I (High Sensitivity)  Result Value Ref Range   Troponin I (High Sensitivity) 355 (HH) <18 ng/L   EP STUDY  Result Date: 12/02/2019 SURGEON:  Hillis Range, MD PREPROCEDURE DIAGNOSES: 1. Persistent atrial fibrillation. POSTPROCEDURE DIAGNOSES: 1. Persistent atrial fibrillation. PROCEDURES: 1. Comprehensive electrophysiologic study. 2. Coronary sinus pacing and recording. 3. Three-dimensional mapping of atrial fibrillation (with additional mapping and ablation within the left atrium due to persistence of afib) 4. Ablation of atrial fibrillation (with additional mapping and ablation within the left atrium due to persistence of afib) 5. Intracardiac echocardiography. 6. Transseptal puncture of an intact septum. 7. Arrhythmia induction with pacing 8. External cardioversion. 9. Right femoral venous ultrasound guided access INTRODUCTION:  DESPINA BOAN is a 79 y.o. female with a history of persistent atrial fibrillation who now presents for EP study and radiofrequency ablation.  The patient reports initially being diagnosed with atrial  fibrillation after presenting with symptomatic palpitations and fatgiue.  The patient has failed medical therapy with amiodarone.  The patient therefore presents today for catheter ablation of atrial fibrillation. DESCRIPTION OF PROCEDURE:  Informed written consent was obtained, and the patient was brought to the electrophysiology lab in a fasting  state.  The patient was adequately sedated with intravenous medications as outlined in the anesthesia report.  The patient's left and right groins were prepped and draped in the usual sterile fashion by the EP lab staff.  Using a percutaneous Seldinger technique, two 7-French and one 9-French hemostasis sheaths were placed into the right common femoral vein.  Direct Ultrasound guidance was used today for right common femoral venous access with normal vessel patency.  Ultrasound images are captured and stored in the patients chart. Catheter Placement:  A 7-French Biosense Webster Decapolar coronary sinus catheter was introduced through the right common femoral vein and advanced into the coronary sinus for recording and pacing from this location.  A 7 F Biosense Webster 3.5 mm SmartTouch SF ablation catheter was introduced through the right common femoral vein and advanced into the right ventricle for recording and pacing.  This catheter was then pulled back to the His bundle location.  A luminal esophageal temperature probe was placed and used for continuous monitoring of the luminal esophageal temperature throughout the procedure as well as to localize the esophagus on fluoroscopy. In addition, the esophagus was directly visualized with intracardiac echo and its positioned marked on Carto.  During ablation at the posterior wall there was limited esophageal heating noted during RF energy delivery with the maximal temperature recorded by the luminal temperature probe of < 38.8 degrees C (baseline 36.6). Initial Measurements: The patient presented to the electrophysiology lab in atrial fibrillation.  The QRS interval was 85 msec. The average RR interval measured 1025 msec.  The HV interval 50 msec.   Intracardiac Echocardiography: An 8-French Biosense Webster AcuNav intracardiac echocardiography catheter was introduced through the right common femoral vein and advanced into the right atrium. Intracardiac  echocardiography was performed of the left atrium, and a three-dimensional anatomical rendering of the left atrium was performed using CARTO sound technology.  The patient was noted to have a moderately enlarged left atrium.  The interatrial septum was prominent but not aneurysmal. All 4 pulmonary veins were visualized and noted to have separate ostia.  The pulmonary veins were moderate in size.  The left atrial appendage was visualized and did not reveal thrombus.   There was no evidence of pulmonary vein stenosis. Transseptal Puncture: The middle right common femoral vein sheath was exchanged for an 8.5 [place] SL2 transseptal sheath and transseptal access was achieved in a standard fashion using a Baylis needle with intracardiac echocardiography confirmation of the transseptal puncture.  Once transseptal access had been achieved, heparin was administered intravenously and intra- arterially in order to maintain an ACT of greater than 350 seconds throughout the procedure.  3D Mapping and Ablation: The His bundle catheter was removed and in its place a 3.5 mm [employer] Thermocool SF ablation catheter was advanced into the right atrium.  The transseptal sheath was pulled back into the IVC over a guidewire.  The ablation catheter was advanced across the transseptal hole using the wire as a guide.  The transseptal sheath was then re-advanced over the guidewire into the left atrium.  A duodecapolar Biosense Webster pentaray mapping catheter was introduced through the transseptal sheath and positioned over the mouth of all 4  pulmonary veins.  Three-dimensional electroanatomical mapping was performed using CARTO technology.  This demonstrated electrical activity within all four pulmonary veins at baseline. The patient underwent successful sequential electrical isolation and anatomical encircling of all four pulmonary veins using radiofrequency current with a pentaray mapping catheter as a guide. A WACA  approach was used.  [employer] was used.  The target Ablation Index was 300-350 on the posterior wall, 450-500 on the anterior wall of the left atrium.  RF was delivered at 50 watts for all lesions. Due to persistence of atrial fibrillation, additional left atrial mapping and ablation was performed.  A series of radiofrequency lesions were delivered along the roof and floor of the left atrium in order to create a "standard box" lesion along the posterior wall of the left atrium. Cardioversion: The patient was then cardioverted to sinus rhythm with a single synchronized 360-J biphasic shock with cardioversion electrodes in the anterior-posterior thoracic configuration.   she remained in sinus rhythm thereafter. Measurements Following Ablation: In sinus rhythm the RR interval was 1167 msec, with PR 159 msec, QRS 90 msec, and Qt 488 msec.  Following ablation the AH interval measured 69 msec with an HV interval of 53 msec. Ventricular pacing was performed, which revealed VA dissociation when pacing at 600 msec. Rapid atrial pacing was performed, which revealed an AV Wenckebach cycle length of 540 msec.  Electroisolation was then again confirmed in all four pulmonary veins.  There was trivial conduction remaining within the left superior pulmonary vein. The procedure was therefore considered completed. Repeat intracardiac echocardiogram at the end of the procedure revealed no pericardial effusion.  All catheters were removed, and the sheaths were aspirated and flushed.   Sheaths were removed and perclose stitch x 3 applied.  The patient was transferred to the recovery area per protocol.  EBL<61ml.  There were no early apparent complications. CONCLUSIONS: 1. Atrial fibrillation upon presentation.  2. Intracardiac echo reveals a moderate sized left atrium with four separate pulmonary veins without evidence of pulmonary vein stenosis. 3. Successful electrical isolation and anatomical encircling of all four  pulmonary veins with radiofrequency current.  A WACA approach was used.  There was trivial conduction remaining within the left superior pulmonary vein. 3. Additional left atrial ablation was performed with a standard box lesion created along the posterior wall of the left atrium 4. Atrial fibrillation successfully cardioverted to sinus rhythm. 5. No early apparent complications. Fayrene Fearing Allred,MD 10:13 AM 12/02/2019   CT CARDIAC MORPH/PULM VEIN W/CM&W/O CA SCORE  Addendum Date: 11/25/2019   ADDENDUM REPORT: 11/25/2019 19:01 CLINICAL DATA:  78 with persistent atrial fibrillation, mitral regurgitation and hypertension scheduled for an ablation. EXAM: Cardiac CT/ACTA TECHNIQUE: The patient was scanned on a Siemens Somatom scanner. FINDINGS: A 120 kV prospective scan was triggered in the descending thoracic aorta at 111 HU's. Gantry rotation speed was 280 msec's and collimation was .9 mm. No beta blockade and no NTG was given. The 3 D data set was reconstructed in 5% intervals of the 60-80 % of the R-R cycle. Diastolic phases were analyzed on a dedicated work station using NPR, MIP and VT modes. The patient received 80 cc of contrast. There is normal pulmonary vein drainage into the left atrium (2 on the right and 2 on the left) with ostial measurements as follows: ROUP: 21.4 mm x 18.1 mm RFLP: 19.7 mm x 17.6 mm LUPV: 20.0 mm x 14.9 mm LLPV: 17.5 mm x 15.8 mm The left atrial appendage is large chicken  wing type with two lobes and ostial size 16.3 mm x 15.8 mm and length 35 mm. There is no thrombus in the left atrial appendage. The esophagus is adjacent to the left upper and lower pulmonary veins. Aorta: Normal caliber. Ascending aorta 2.8 cm. No dissection. There is calcification of the aortic root. Aortic Valve:  Trileaflet.  No calcifications. Coronary Arteries: Normal coronary origin. Right dominance. The study was performed without use of NTG and insufficient for plaque evaluation. IMPRESSION: 1. There is normal  pulmonary vein drainage into the left atrium. 2. The left atrial appendage is large chicken wing type with two lobes and ostial size 16.3 mm x 15.8 mm and length 35 mm. There is no thrombus in the left atrial appendage. 3. The esophagus is adjacent to the left upper and lower pulmonary veins. Chilton Siiffany Wilder Electronically Signed   By: Chilton Siiffany  [place]   On: 11/25/2019 19:01   Result Date: 11/25/2019 EXAM: OVER-READ INTERPRETATION  CT CHEST The following report is an over-read performed by radiologist Dr. Trudie Reed[facility]iel [facility] on 11/25/2019. This over-read does not include interpretation of cardiac or coronary anatomy or pathology. The coronary calcium score/coronary CTA interpretation by the cardiologist is attached. COMPARISON:  None. FINDINGS: Aortic atherosclerosis. Within the visualized portions of the thorax there are no suspicious appearing pulmonary nodules or masses, there is no acute consolidative airspace disease, no pleural effusions, no pneumothorax and no lymphadenopathy. Visualized portions of the upper abdomen are unremarkable. There are no aggressive appearing lytic or blastic lesions noted in the visualized portions of the skeleton. IMPRESSION: 1.  Aortic Atherosclerosis (ICD10-I70.0). Electronically Signed: By: Trudie Reedaniel  Entrikin M.D. On: 11/25/2019 09:29   DG Chest Port 1 View  Result Date: 12/04/2019 CLINICAL DATA:  79 year old who is postprocedure day 2 from a cardiac ablation for atrial fibrillation. She presents with acute onset of cough and shortness of breath that began yesterday. EXAM: PORTABLE CHEST 1 VIEW COMPARISON:  CT heart 11/25/2019 and chest x-ray 03/31/2019. FINDINGS: Cardiac silhouette upper normal in size to slightly enlarged. Airspace opacities throughout both lungs, most confluent in the upper lobes. Chronic elevation of the RIGHT hemidiaphragm. No visible pleural effusions. IMPRESSION: Bilateral airspace opacities, most confluent involving the upper  lobes, indicating airspace pulmonary edema versus pneumonia. Electronically Signed   By: Hulan Saashomas  Lawrence M.D.   On: 12/04/2019 11:21    EKG EKG Interpretation  Date/Time:  Thursday December 04 2019 10:37:54 EDT Ventricular Rate:  80 PR Interval:    QRS Duration: 87 QT Interval:  378 QTC Calculation: 436 R Axis:   26 Text Interpretation: Sinus rhythm Premature atrial complexes Confirmed by Cathren LaineSteinl, Olivya Sobol ([id number]) on 12/04/2019 12:47:03 PM   Radiology DG Chest Port 1 View  Result Date: 12/04/2019 CLINICAL DATA:  79 year old who is postprocedure day 2 from a cardiac ablation for atrial fibrillation. She presents with acute onset of cough and shortness of breath that began yesterday. EXAM: PORTABLE CHEST 1 VIEW COMPARISON:  CT heart 11/25/2019 and chest x-ray 03/31/2019. FINDINGS: Cardiac silhouette upper normal in size to slightly enlarged. Airspace opacities throughout both lungs, most confluent in the upper lobes. Chronic elevation of the RIGHT hemidiaphragm. No visible pleural effusions. IMPRESSION: Bilateral airspace opacities, most confluent involving the upper lobes, indicating airspace pulmonary edema versus pneumonia. Electronically Signed   By: Hulan Saashomas  Lawrence M.D.   On: 12/04/2019 11:21    Procedures Procedures (including critical care time)  Medications Ordered in ED Medications  0.9 %  sodium chloride infusion (has no  administration in time range)    ED Course  I have reviewed the triage vital signs and the nursing notes.  Pertinent labs & imaging results that were available during my care of the patient were reviewed by me and considered in my medical decision making (see chart for details).    MDM Rules/Calculators/A&P                      Iv ns. Stat labs. Cultures sent. Iv abx. Continuous pulse ox and monitor.   Room air sats 80%. On 4 liters [place], sat 94%.  Reviewed nursing notes and prior charts for additional history.  Recent ablation procedure for afib.   Labs  reviewed/interpreted by me - chem normal, w mild aki. Ns bolus. Initial lactate 2. Iv ns bolus.   CXR reviewed/interpreted by me - bil infiltrates, esp upper. After cultures sent, iv antibiotics given.   Additional labs reviewed/interpreted by me - ddimer normal. Wbc elev. Trop is high. No chest pain or discomfort on recheck.   MDM Number of Diagnoses or Management Options   Amount and/or Complexity of Data Reviewed Clinical lab tests: ordered and reviewed Tests in the radiology section of CPT: reviewed Tests in the medicine section of CPT: ordered and reviewed Decide to obtain previous medical records or to obtain history from someone other than the patient: yes Obtain history from someone other than the patient: yes Review and summarize past medical records: yes Discuss the patient with other providers: yes Independent visualization of images, tracings, or specimens: yes  Risk of Complications, Morbidity, and/or Mortality Presenting problems: high Diagnostic procedures: high Management options: high   Recheck pt, no cp or discomfort. No change in resp status, stable on supplemental o2.   covid test remains pending.   hospitalists consulted for admission.  CRITICAL CARE RE: acute respiratory failure due to bilateral pna w hypoxia, aki, elevated troponin Performed by: Suzi Roots Total critical care time: 40 minutes Critical care time was exclusive of separately billable procedures and treating other patients. Critical care was necessary to treat or prevent imminent or life-threatening deterioration. Critical care was time spent personally by me on the following activities: development of treatment plan with patient and/or surrogate as well as nursing, discussions with consultants, evaluation of patient's response to treatment, examination of patient, obtaining history from patient or surrogate, ordering and performing treatments and interventions, ordering and review of  laboratory studies, ordering and review of radiographic studies, pulse oximetry and re-evaluation of patient's condition.    Final Clinical Impression(s) / ED Diagnoses Final diagnoses:  None    Rx / DC Orders ED Discharge Orders    None       Cathren Laine, MD 12/04/19 [phone]

## 2019-12-04 NOTE — H&P (Signed)
History and Physical    Virginia Gilbert:[id number] DOB: 04/22/1941 DOA: 12/04/2019  PCP: Pearson Grippe, MD  Patient coming from: Home  I have personally briefly reviewed patient's old medical records in [facility] Link  Chief Complaint: SOB  HPI: Virginia Gilbert is a 79 y.o. female with medical history significant of paroxysmal A. fib, hypertension, IDDM, mitral regurgitation, presented with increasing short of breath.  Patient went to have ablation for her chronic A. fib the day before yesterday.  The procedure lasted about 2 hours.  Patient went home without any significant complaints.  Yesterday she had few episodes of chills, no fever, no cough.  This morning she started to feel significant short of breath, occasional cough and new onset of wheezing, no fever or chills.  ED Course: Patient was found to be very tachypneic, hypoxia, stabilized on 4 L.  X-ray showing multifocal pneumonia.  WBC 15  Review of Systems: As per HPI otherwise 10 point review of systems negative.    Past Medical History:  Diagnosis Date  . Diabetes mellitus without complication (HCC)   . Hyperlipidemia   . Hypertension   . Mitral regurgitation    evaluated by TEE 09/2019  . Persistent atrial fibrillation [facility])     Past Surgical History:  Procedure Laterality Date  . ATRIAL FIBRILLATION ABLATION N/A 12/02/2019   Procedure: ATRIAL FIBRILLATION ABLATION;  Surgeon: Hillis Range, MD;  Location: [facility] INVASIVE CV LAB;  Service: Cardiovascular;  Laterality: N/A;  . BUBBLE STUDY  10/08/2019   Procedure: BUBBLE STUDY;  Surgeon: [name], MD;  Location: [facility] ENDOSCOPY;  Service: Cardiology;;  . CARDIOVERSION N/A 07/08/2019   Procedure: CARDIOVERSION;  Surgeon: Yates Decamp, MD;  Location: [facility] ENDOSCOPY;  Service: Cardiovascular;  Laterality: N/A;  . CARDIOVERSION N/A 09/02/2019   Procedure: CARDIOVERSION;  Surgeon: Yates Decamp, MD;  Location: [facility] ENDOSCOPY;  Service: Cardiovascular;  Laterality: N/A;  . PARTIAL  HYSTERECTOMY     1977  . TEE WITHOUT CARDIOVERSION N/A 10/08/2019   Procedure: TRANSESOPHAGEAL ECHOCARDIOGRAM (TEE);  Surgeon: [name], MD;  Location: [facility] ENDOSCOPY;  Service: Cardiology;  Laterality: N/A;     reports that she quit smoking about 34 years ago. Her smoking use included cigarettes. She has a 7.50 pack-year smoking history. She has never used smokeless tobacco. She reports that she does not drink alcohol or use drugs.  Allergies  Allergen Reactions  . Codeine Nausea And Vomiting  . Penicillins Anaphylaxis    Did it involve swelling of the face/tongue/throat, SOB, or low BP? Yes Did it involve sudden or severe rash/hives, skin peeling, or any reaction on the inside of your mouth or nose? Unknown Did you need to seek medical attention at a hospital or doctor's office? Yes--inpatient when reaction occurred When did it last happen?1964 or 1965 If all above answers are "NO", may proceed with cephalosporin use.   . Tramadol Itching    Family History  Problem Relation Age of Onset  . Heart disease Mother   . Diabetes Mother   . Emphysema Father   . Emphysema Brother   . Heart disease Brother   . Diabetes Brother   . Heart disease Brother      Prior to Admission medications   Medication Sig Start Date End Date Taking? Authorizing Provider  amLODipine (NORVASC) 5 MG tablet Take 5 mg by mouth daily. 05/10/18  Yes [provider]  apixaban (ELIQUIS) 5 MG TABS tablet Take 1 tablet (5 mg total) by mouth 2 (  two) times daily. 10/21/19  Yes [name], MD  carvedilol (COREG) 3.125 MG tablet Take 3.125 mg by mouth 2 (two) times daily.  04/18/18  Yes [provider]  Cholecalciferol (VITAMIN D-3) 125 MCG (5000 UT) TABS Take 5,000 Units by mouth daily after lunch.   Yes [provider]  CINNAMON PO Take 1,000 mg by mouth daily.   Yes [provider]  Cyanocobalamin (VITAMIN B-12) 2500 MCG SUBL Place 2,500 mcg under the tongue  daily with lunch.   Yes [provider]  ezetimibe (ZETIA) 10 MG tablet Take 10 mg by mouth daily.   Yes [provider]  glimepiride (AMARYL) 4 MG tablet Take 4 mg by mouth 2 (two) times daily.    Yes [provider]  hydrochlorothiazide (HYDRODIURIL) 12.5 MG tablet Take 12.5 mg by mouth daily.   Yes [provider]  ibuprofen (ADVIL) 200 MG tablet Take 400 mg by mouth every 6 (six) hours as needed for headache or moderate pain.   Yes [provider]  losartan (COZAAR) 100 MG tablet Take 100 mg by mouth daily.  03/06/15  Yes [provider]  metFORMIN (GLUCOPHAGE) 1000 MG tablet Take 1,000 mg by mouth 2 (two) times daily.  02/26/15  Yes [provider]  Omega-3 Fatty Acids (FISH OIL) 1000 MG CAPS Take 1,000 mg by mouth daily.   Yes [provider]  pantoprazole (PROTONIX) 40 MG tablet Take 1 tablet (40 mg total) by mouth daily. 12/02/19 01/16/20 Yes Allred, Fayrene Fearing, MD  TRULICITY 1.5 MG/0.5ML SOPN Inject 1.5 mg into the skin every Friday.  05/29/18  Yes [provider]  TRESIBA 100 UNIT/ML SOLN Inject 5 Units into the skin daily.    [provider]    Physical Exam: Vitals:   12/04/19 1400 12/04/19 1415 12/04/19 1430 12/04/19 1443  BP: (!) 102/52 (!) 116/52 (!) 112/50 (!) 112/50  Pulse: (!) 111 (!) 120 (!) 103 62  Resp: (!) 24 (!) 26 (!) 21 (!) 28  Temp:      TempSrc:      SpO2: 96% 95% 96% 95%  Weight:      Height:        Constitutional: NAD, calm, comfortable Vitals:   12/04/19 1400 12/04/19 1415 12/04/19 1430 12/04/19 1443  BP: (!) 102/52 (!) 116/52 (!) 112/50 (!) 112/50  Pulse: (!) 111 (!) 120 (!) 103 62  Resp: (!) 24 (!) 26 (!) 21 (!) 28  Temp:      TempSrc:      SpO2: 96% 95% 96% 95%  Weight:      Height:       Eyes: PERRL, lids and conjunctivae normal ENMT: Mucous membranes are moist. Posterior pharynx clear of any exudate or lesions.Normal dentition.  Neck: normal, supple, no masses, no  thyromegaly Respiratory: Diffuse wheezing, no crackles.  Increased respiratory effort. No accessory muscle use.  Cardiovascular: Irregular heart rate, no murmurs / rubs / gallops. No extremity edema. 2+ pedal pulses. No carotid bruits.  Abdomen: no tenderness, no masses palpated. No hepatosplenomegaly. Bowel sounds positive.  Musculoskeletal: no clubbing / cyanosis. No joint deformity upper and lower extremities. Good ROM, no contractures. Normal muscle tone.  Skin: no rashes, lesions, ulcers. No induration Neurologic: CN 2-12 grossly intact. Sensation intact, DTR normal. Strength 5/5 in all 4.  Psychiatric: Normal judgment and insight. Alert and oriented x 3. Normal mood.    Labs on Admission: I have personally reviewed following labs and imaging studies  CBC:  Recent Labs  Lab 12/04/19 1041  WBC 15.1*  HGB 10.0*  HCT 34.3*  MCV 96.3  PLT 141*   Basic Metabolic Panel: Recent Labs  Lab 12/04/19 1041  NA 134*  K 4.2  CL 100  CO2 22  GLUCOSE 229*  BUN 29*  CREATININE 1.04*  CALCIUM 8.2*   GFR: Estimated Creatinine Clearance: 38.9 mL/min (A) (by C-G formula based on SCr of 1.04 mg/dL (H)). Liver Function Tests: Recent Labs  Lab 12/04/19 1041  AST 39  ALT 41  ALKPHOS 47  BILITOT 1.0  PROT 5.5*  ALBUMIN 3.1*   No results for input(s): LIPASE, AMYLASE in the last 168 hours. No results for input(s): AMMONIA in the last 168 hours. Coagulation Profile: No results for input(s): INR, PROTIME in the last 168 hours. Cardiac Enzymes: No results for input(s): CKTOTAL, CKMB, CKMBINDEX, TROPONINI in the last 168 hours. BNP (last 3 results) No results for input(s): PROBNP in the last 8760 hours. HbA1C: No results for input(s): HGBA1C in the last 72 hours. CBG: Recent Labs  Lab 12/02/19 0554 12/02/19 1141  GLUCAP 74 229*   Lipid Profile: No results for input(s): CHOL, HDL, LDLCALC, TRIG, CHOLHDL, LDLDIRECT in the last 72 hours. Thyroid Function Tests: No results for  input(s): TSH, T4TOTAL, FREET4, T3FREE, THYROIDAB in the last 72 hours. Anemia Panel: No results for input(s): VITAMINB12, FOLATE, FERRITIN, TIBC, IRON, RETICCTPCT in the last 72 hours. Urine analysis:    Component Value Date/Time   COLORURINE YELLOW 12/04/2019 1550   APPEARANCEUR CLEAR 12/04/2019 1550   LABSPEC 1.023 12/04/2019 1550   PHURINE 5.0 12/04/2019 1550   GLUCOSEU NEGATIVE 12/04/2019 1550   HGBUR NEGATIVE 12/04/2019 1550   BILIRUBINUR NEGATIVE 12/04/2019 1550   KETONESUR NEGATIVE 12/04/2019 1550   PROTEINUR NEGATIVE 12/04/2019 1550   NITRITE NEGATIVE 12/04/2019 1550   LEUKOCYTESUR NEGATIVE 12/04/2019 1550    Radiological Exams on Admission: DG Chest Port 1 View  Result Date: 12/04/2019 CLINICAL DATA:  79 year old who is postprocedure day 2 from a cardiac ablation for atrial fibrillation. She presents with acute onset of cough and shortness of breath that began yesterday. EXAM: PORTABLE CHEST 1 VIEW COMPARISON:  CT heart 11/25/2019 and chest x-ray 03/31/2019. FINDINGS: Cardiac silhouette upper normal in size to slightly enlarged. Airspace opacities throughout both lungs, most confluent in the upper lobes. Chronic elevation of the RIGHT hemidiaphragm. No visible pleural effusions. IMPRESSION: Bilateral airspace opacities, most confluent involving the upper lobes, indicating airspace pulmonary edema versus pneumonia. Electronically Signed   By: Hulan Saas M.D.   On: 12/04/2019 11:21    EKG: Independently reviewed.  No acute ST-T changes  Assessment/Plan Active Problems:   Community acquired pneumonia   Pneumonia  Acute hypoxic respiratory failure CAP vs aspiration pneumonia  Acute bacterial pneumonia With x-ray showing infiltrates and elevated white count, will cover for CAP for now  Elevated troponin No chest pain, no significant ST-T changes on EKG, troponin elevation pattern is flat, and she had a normal stress test in Oct 2020, suspect this is related to  ablation 2 days ago, one more set in AM  Paroxysmal A. Fib status post ablation In and out A. 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  But she never had a formal pulmonary function test We will  treat as a mild exacerbation, with p.o. steroid, Atrovent only to prevent tachycardia, and LABA. Outpatient pulmonology follow-up  Hypertension Blood pressure borderline, will hold Norvasc, ARB and HCTZ Continue Coreg As needed hydralazine  IIDM uncontrolled Hold p.o. DM meds, start sliding scale   DVT prophylaxis: Eliquis Code Status: Full code Family Communication: Daughter at bedside Disposition Plan: Likely can be discharged in 2 to 3 days Consults called: None Admission status: Telemetry admission   Lequita Halt MD [facility] Pager [phone]    12/04/2019, 5:06 PM

## 2019-12-04 NOTE — ED Triage Notes (Signed)
Pt arrives via [facility] from home with complaints of SOB. Pt states she had multiple ablations done on 12/02/2019 for AFIB. Last night she had sudden onset of SOB which has increased through the night. Upon EMS arrival 76% . Placed on NRB with relief. Pt currently on 4 liters [place]. Pt diaphoretic and pale.   128/68 P 80 AFIB 96% 4 liters [place] 97.9  300 NS en route

## 2019-12-04 NOTE — ED Notes (Signed)
Pt [place] came off nose, SPO2 dropped to 87%, reapplied and reasses, SPO2 93% with increase O2 level to 5L

## 2019-12-04 NOTE — ED Notes (Signed)
Attempted to call report to 2W. Was informed RN could not take report d/t trying to finish up shift. No other personal could take report.

## 2019-12-05 ENCOUNTER — Inpatient Hospital Stay (HOSPITAL_COMMUNITY): Payer: Medicare Other

## 2019-12-05 DIAGNOSIS — I340 Nonrheumatic mitral (valve) insufficiency: Secondary | ICD-10-CM

## 2019-12-05 DIAGNOSIS — I361 Nonrheumatic tricuspid (valve) insufficiency: Secondary | ICD-10-CM

## 2019-12-05 DIAGNOSIS — J9601 Acute respiratory failure with hypoxia: Secondary | ICD-10-CM

## 2019-12-05 DIAGNOSIS — J189 Pneumonia, unspecified organism: Secondary | ICD-10-CM

## 2019-12-05 DIAGNOSIS — I5023 Acute on chronic systolic (congestive) heart failure: Secondary | ICD-10-CM

## 2019-12-05 DIAGNOSIS — I4819 Other persistent atrial fibrillation: Principal | ICD-10-CM

## 2019-12-05 LAB — LACTIC ACID, PLASMA: Lactic Acid, Venous: 1.6 mmol/L (ref 0.5–1.9)

## 2019-12-05 LAB — BASIC METABOLIC PANEL
Anion gap: 11 (ref 5–15)
Anion gap: 8 (ref 5–15)
BUN: 19 mg/dL (ref 8–23)
BUN: 19 mg/dL (ref 8–23)
CO2: 23 mmol/L (ref 22–32)
CO2: 25 mmol/L (ref 22–32)
Calcium: 8.2 mg/dL — ABNORMAL LOW (ref 8.9–10.3)
Calcium: 8.2 mg/dL — ABNORMAL LOW (ref 8.9–10.3)
Chloride: 103 mmol/L (ref 98–111)
Chloride: 104 mmol/L (ref 98–111)
Creatinine, Ser: 0.83 mg/dL (ref 0.44–1.00)
Creatinine, Ser: 0.79 mg/dL (ref 0.44–1.00)
GFR calc Af Amer: >60 mL/min (ref >60)
GFR calc Af Amer: >60 mL/min (ref >60)
GFR calc non Af Amer: >60 mL/min (ref >60)
GFR calc non Af Amer: >60 mL/min (ref >60)
Glucose, Bld: 255 mg/dL — ABNORMAL HIGH (ref 70–99)
Glucose, Bld: 235 mg/dL — ABNORMAL HIGH (ref 70–99)
Potassium: 3.5 mmol/L (ref 3.5–5.1)
Potassium: 3.3 mmol/L — ABNORMAL LOW (ref 3.5–5.1)
Sodium: 137 mmol/L (ref 135–145)
Sodium: 137 mmol/L (ref 135–145)

## 2019-12-05 LAB — ECHOCARDIOGRAM COMPLETE
Height: 59 in
Weight: 2430.35 oz

## 2019-12-05 LAB — CBC
HCT: 31.5 % — ABNORMAL LOW (ref 36.0–46.0)
Hemoglobin: 9.6 g/dL — ABNORMAL LOW (ref 12.0–15.0)
MCH: 28.2 pg (ref 26.0–34.0)
MCHC: 30.5 g/dL (ref 30.0–36.0)
MCV: 92.6 fL (ref 80.0–100.0)
Platelets: 138 10*3/uL — ABNORMAL LOW (ref 150–400)
RBC: 3.4 MIL/uL — ABNORMAL LOW (ref 3.87–5.11)
RDW: 15.3 % (ref 11.5–15.5)
WBC: 9.7 10*3/uL (ref 4.0–10.5)
nRBC: 0 % (ref 0.0–0.2)

## 2019-12-05 LAB — PROCALCITONIN: Procalcitonin: 0.31 ng/mL

## 2019-12-05 LAB — GLUCOSE, CAPILLARY
Glucose-Capillary: 165 mg/dL — ABNORMAL HIGH (ref 70–99)
Glucose-Capillary: 290 mg/dL — ABNORMAL HIGH (ref 70–99)
Glucose-Capillary: 383 mg/dL — ABNORMAL HIGH (ref 70–99)
Glucose-Capillary: 407 mg/dL — ABNORMAL HIGH (ref 70–99)

## 2019-12-05 LAB — TROPONIN I (HIGH SENSITIVITY): Troponin I (High Sensitivity): 143 ng/L (ref <18)

## 2019-12-05 MED ORDER — IPRATROPIUM-ALBUTEROL 0.5-2.5 (3) MG/3ML IN SOLN
3.0000 mL | Freq: Three times a day (TID) | RESPIRATORY_TRACT | Status: DC
  Administered 2019-12-05 – 2019-12-07 (×6): 3 mL via RESPIRATORY_TRACT
  Filled 2019-12-05 (×8): qty 3

## 2019-12-05 MED ORDER — FUROSEMIDE 10 MG/ML IJ SOLN
80.0000 mg | Freq: Once | INTRAMUSCULAR | Status: AC
  Administered 2019-12-05: 80 mg via INTRAVENOUS
  Filled 2019-12-05: qty 8

## 2019-12-05 MED ORDER — INSULIN ASPART 100 UNIT/ML ~~LOC~~ SOLN
7.0000 [IU] | Freq: Once | SUBCUTANEOUS | Status: AC
  Administered 2019-12-05: 7 [IU] via SUBCUTANEOUS

## 2019-12-05 MED ORDER — GLIMEPIRIDE 4 MG PO TABS
4.0000 mg | ORAL_TABLET | Freq: Two times a day (BID) | ORAL | Status: DC
  Administered 2019-12-05 – 2019-12-07 (×4): 4 mg via ORAL
  Filled 2019-12-05 (×4): qty 1

## 2019-12-05 MED ORDER — FUROSEMIDE 10 MG/ML IJ SOLN
80.0000 mg | Freq: Once | INTRAMUSCULAR | Status: AC
  Administered 2019-12-06: 80 mg via INTRAVENOUS
  Filled 2019-12-05: qty 8

## 2019-12-05 MED ORDER — DULAGLUTIDE 1.5 MG/0.5ML ~~LOC~~ SOAJ
1.5000 mg | SUBCUTANEOUS | Status: DC
  Administered 2019-12-06: 1.5 mg via SUBCUTANEOUS
  Filled 2019-12-05: qty 1

## 2019-12-05 MED ORDER — POTASSIUM CHLORIDE CRYS ER 20 MEQ PO TBCR
40.0000 meq | EXTENDED_RELEASE_TABLET | Freq: Once | ORAL | Status: AC
  Administered 2019-12-05: 40 meq via ORAL
  Filled 2019-12-05: qty 2

## 2019-12-05 NOTE — Evaluation (Signed)
Occupational Therapy Evaluation Patient Details Name: Virginia Gilbert MRN: [id number] DOB: 04-07-1941 Today's Date: 12/05/2019    History of Present Illness 79 yo female admitted to ED On 4/22 with shortness of breath, medical diagnosis of multifocal PNA. Pt with ablation procedure on 4/20 for afib. PMH includes afib, DM, HLD, MVR.   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 She presents with generalized weakness, decreased activity tolerance and impaired standing balance. Assisted pt for LB ADL due to difficulty using R UE with IV site at elbow and painful. Pt required HHA to ambulate to bathroom. Pt likely to progress well as medical issues resolve. Will follow acutely.    Follow Up Recommendations  Home health OT;Supervision/Assistance - 24 hour    Equipment Recommendations  None recommended by OT    Recommendations for Other Services       Precautions / Restrictions Precautions Precautions: Fall Precaution Comments: monitor HR Restrictions Weight Bearing Restrictions: No      Mobility Bed Mobility Overal bed mobility: Needs Assistance Bed Mobility: Supine to Sit;Sit to Supine     Supine to sit: Min guard;HOB elevated Sit to supine: Min assist;HOB elevated   General bed mobility comments: Min guard for supine to sit, lines and leads management by PT/OT. Light assist for LE lifting into bed upon return to supine, increased time and effort.  Transfers Overall transfer level: Needs assistance Equipment used: 2 person hand held assist Transfers: Sit to/from Stand Sit to Stand: Min assist;From elevated surface         General transfer comment: Min assist +2 HHA for power up, steadying upon standing. Increased time to rise from EOB. Stood with min +1 from toilet for rise.    Balance Overall balance assessment: Needs assistance Sitting-balance  support: No upper extremity supported;Feet supported Sitting balance-Leahy Scale: Fair     Standing balance support: Bilateral upper extremity supported Standing balance-Leahy Scale: Poor Standing balance comment: unsteady, reliant on PT/OT HHA                           ADL either performed or assessed with clinical judgement   ADL Overall ADL's : Needs assistance/impaired Eating/Feeding: Independent   Grooming: Min guard;Standing;Wash/dry hands   Upper Body Bathing: Set up;Sitting   Lower Body Bathing: Maximal assistance;Sit to/from stand   Upper Body Dressing : Minimal assistance;Sitting   Lower Body Dressing: Maximal assistance;Sit to/from stand   Toilet Transfer: Minimal assistance;Ambulation   Toileting- Clothing Manipulation and Hygiene: Minimal assistance;Sit to/from stand       Functional mobility during ADLs: Minimal assistance General ADL Comments: +2 min B hand held assist     Vision Baseline Vision/History: Wears glasses Wears Glasses: At all times Patient Visual Report: No change from baseline       Perception     Praxis      Pertinent Vitals/Pain Pain Assessment: Faces Faces Pain Scale: Hurts a little bit Pain Location: generalized Pain Descriptors / Indicators: Discomfort Pain Intervention(s): Repositioned;Monitored during session     Hand Dominance Right   Extremity/Trunk Assessment Upper Extremity Assessment Upper Extremity Assessment: Overall WFL for tasks assessed   Lower Extremity Assessment Lower Extremity Assessment: Defer to PT evaluation   Cervical / Trunk Assessment Cervical / Trunk Assessment: Kyphotic   Communication Communication Communication: No difficulties  Cognition Arousal/Alertness: Awake/alert Behavior During Therapy: WFL for tasks assessed/performed Overall Cognitive Status: Within Functional Limits for tasks assessed                                     General Comments  SpO2 92%  and above on 5LO2 via [place].    Exercises     Shoulder Instructions      Home Living Family/patient expects to be discharged to:: Private residence Living Arrangements: Children(son that works) Available Help at Discharge: Family;Available 24 hours/day Type of Home: House Home Access: Stairs to enter [employer] of Steps: 6 Entrance Stairs-Rails: Right;Left;Can reach both Home Layout: One level     Bathroom Shower/Tub: Occupational psychologist: Standard     Home Equipment: Shower seat;Grab bars - tub/shower;Bedside commode;Walker - 2 wheels;Cane - single point          Prior Functioning/Environment Level of Independence: Independent with assistive device(s)        Comments: drives, she works as Building control surveyor, keeps 3 in 1 over toilet and sits to shower        OT Problem List: Decreased activity tolerance;Impaired balance (sitting and/or standing);Decreased strength;Decreased knowledge of use of DME or AE;Cardiopulmonary status limiting activity      OT Treatment/Interventions: Self-care/ADL training;DME and/or AE instruction;Energy conservation;Therapeutic activities;Patient/family education;Balance training    OT Goals(Current goals can be found in the care plan section) Acute Rehab OT Goals Patient Stated Goal: go home, return to independence OT Goal Formulation: With patient Time For Goal Achievement: 12/19/19 Potential to Achieve Goals: Good ADL Goals Pt Will Perform Grooming: with supervision;standing Pt Will Perform Lower Body Bathing: with supervision;sit to/from stand Pt Will Perform Lower Body Dressing: with supervision;sit to/from stand Pt Will Transfer to Toilet: with supervision;ambulating Pt Will Perform Toileting - Clothing Manipulation and hygiene: with supervision;sit to/from stand Additional ADL Goal #1: Pt will generalize energy conservation strategies in ADL and mobility as instructed.  OT Frequency: Min 2X/week   Barriers to  D/C:            Co-evaluation PT/OT/SLP Co-Evaluation/Treatment: Yes Reason for Co-Treatment: For patient/therapist safety PT goals addressed during session: Mobility/safety with mobility OT goals addressed during session: ADL's and self-care      AM-PAC OT "6 Clicks" Daily Activity     Outcome Measure Help from another person eating meals?: None Help from another person taking care of personal grooming?: A Little Help from another person toileting, which includes using toliet, bedpan, or urinal?: A Little Help from another person bathing (including washing, rinsing, drying)?: A Lot Help from another person to put on and taking off regular upper body clothing?: A Little Help from another person to put on and taking off regular lower body clothing?: A Lot 6 Click Score: 17   End of Session Equipment Utilized During Treatment: Oxygen(5L)  Activity Tolerance: Patient limited by fatigue Patient left: in bed;with call bell/phone within reach  OT Visit Diagnosis: Unsteadiness on feet (R26.81);Other abnormalities of gait and mobility (R26.89);Muscle weakness (generalized) (M62.81)                Time: 9323-5573 OT Time Calculation (min): 24 min Charges:  OT General Charges $OT Visit: 1 Visit OT Evaluation $OT Eval Moderate Complexity: 1 Mod  Nestor Lewandowsky, OTR/L Acute Rehabilitation Services Pager: [phone] Office: [phone]  Malka So 12/05/2019, 12:15 PM

## 2019-12-05 NOTE — Progress Notes (Signed)
PT was identified using name,DOB and MRN

## 2019-12-05 NOTE — Progress Notes (Signed)
Nutrition Brief Note  RD consulted as part of the COPD Protocol.  Spoke with pt, daughter, and granddaughter at bedside. Noted ~90% completed lunch meal tray. Pt reports that she had a decreased appetite yesterday for a bit but is has now returned. Pt denies any issues with her appetite or eating PTA. Pt states that she eats 2-3 meals daily. A meal may include meatloaf and green beans. Pt enjoys cooking.  Pt denies any recent weight changes and reports her UBW as 152 lbs. Pt states that her clothes are fitting "the same." Pt denies any issues with N/V or any difficulties chewing and swallowing. Pt denies any other nutrition-related issues.  Wt Readings from Last 15 Encounters:  12/04/19 68.9 kg  12/02/19 68.9 kg  10/22/19 71.2 kg  10/21/19 71.6 kg  10/08/19 70.3 kg  09/26/19 70.3 kg  09/22/19 72.2 kg  09/18/19 70.5 kg  09/11/19 71 kg  09/02/19 70.8 kg  08/25/19 70.8 kg  07/24/19 71.6 kg  07/08/19 70.3 kg  06/18/19 72.6 kg  05/15/19 75 kg    Body mass index is 30.68 kg/m. Patient meets criteria for obesity class I based on current BMI.   Current diet order is Heart Healthy/Carb Modified. Labs and medications reviewed.   No nutrition interventions warranted at this time. If nutrition issues arise, please consult RD.   Earma Reading, MS, RD, LDN Inpatient Clinical Dietitian Pager: [phone] Weekend/After Hours: [phone]

## 2019-12-05 NOTE — Discharge Instructions (Signed)

## 2019-12-05 NOTE — Progress Notes (Signed)
PT was id using her name, DOB and MRN

## 2019-12-05 NOTE — Consult Note (Signed)
ELECTROPHYSIOLOGY CONSULT NOTE    Primary Care Physician: Pearson Grippe, MD Referring Physician:  Admit Date: 12/04/2019  Reason for consultation:  afib  Virginia Gilbert is a 79 y.o. female with a h/o HTN, persistent afib, and DM who is admitted with acute diastolic dysfunction/ hypoxic respiratory failure post ablation. She underwent afib ablation by me 12/02/19.  She did well initially post ablation.  She subsequently developed worsening SOB.  She did not have fevers, chills, cough, or chest pain.  She was admitted with CT findings consistent with edema.  She is improving.  She has been noted to have afib (rate controlled).     Past Medical History:  Diagnosis Date  . Diabetes mellitus without complication (HCC)   . Hyperlipidemia   . Hypertension   . Mitral regurgitation    evaluated by TEE 09/2019  . Persistent atrial fibrillation [facility])    Past Surgical History:  Procedure Laterality Date  . ATRIAL FIBRILLATION ABLATION N/A 12/02/2019   Procedure: ATRIAL FIBRILLATION ABLATION;  Surgeon: Hillis Range, MD;  Location: [facility] INVASIVE CV LAB;  Service: Cardiovascular;  Laterality: N/A;  . BUBBLE STUDY  10/08/2019   Procedure: BUBBLE STUDY;  Surgeon: [name], MD;  Location: [facility] ENDOSCOPY;  Service: Cardiology;;  . CARDIOVERSION N/A 07/08/2019   Procedure: CARDIOVERSION;  Surgeon: Yates Decamp, MD;  Location: [facility] ENDOSCOPY;  Service: Cardiovascular;  Laterality: N/A;  . CARDIOVERSION N/A 09/02/2019   Procedure: CARDIOVERSION;  Surgeon: Yates Decamp, MD;  Location: [facility] ENDOSCOPY;  Service: Cardiovascular;  Laterality: N/A;  . PARTIAL HYSTERECTOMY     1977  . TEE WITHOUT CARDIOVERSION N/A 10/08/2019   Procedure: TRANSESOPHAGEAL ECHOCARDIOGRAM (TEE);  Surgeon: [name], MD;  Location: [facility] ENDOSCOPY;  Service: Cardiology;  Laterality: N/A;    . apixaban  5 mg Oral BID  . azithromycin  500 mg Oral Daily  . carvedilol  3.125 mg Oral BID  . cholecalciferol  5,000 Units Oral QPC  lunch  . Dulaglutide  1.5 mg Subcutaneous Q Fri  . ezetimibe  10 mg Oral Daily  . glimepiride  4 mg Oral BID WC  . insulin aspart  0-5 Units Subcutaneous QHS  . insulin aspart  0-9 Units Subcutaneous TID WC  . ipratropium-albuterol  3 mL Nebulization TID  . losartan  100 mg Oral Daily  . mometasone-formoterol  1 puff Inhalation BID  . omega-3 acid ethyl esters  1 g Oral Daily  . pantoprazole  40 mg Oral Daily  . predniSONE  40 mg Oral Q breakfast  . vitamin B-12  2,500 mcg Oral Q lunch   . sodium chloride    . cefTRIAXone (ROCEPHIN)  IV 1 g (12/05/19 0849)    Allergies  Allergen Reactions  . Codeine Nausea And Vomiting  . Penicillins Anaphylaxis    Did it involve swelling of the face/tongue/throat, SOB, or low BP? Yes Did it involve sudden or severe rash/hives, skin peeling, or any reaction on the inside of your mouth or nose? Unknown Did you need to seek medical attention at a hospital or doctor's office? Yes--inpatient when reaction occurred When did it last happen?1964 or 1965 If all above answers are "NO", may proceed with cephalosporin use.   . Tramadol Itching    Social History   Socioeconomic History  . Marital status: Widowed    Spouse name: Not on file  . Number of children: 3  . Years of education: Not on file  . Highest education level: Not on file  Occupational  History  . Not on file  Tobacco Use  . Smoking status: Former Smoker    Packs/day: 0.50    Years: 15.00    Pack years: 7.50    Types: Cigarettes    Quit date: 05/14/1985    Years since quitting: 34.5  . Smokeless tobacco: Never Used  Substance and Sexual Activity  . Alcohol use: No    Alcohol/week: 0.0 standard drinks  . Drug use: Never  . Sexual activity: Not on file  Other Topics Concern  . Not on file  Social History Narrative  . Not on file   Social Determinants of Health   Financial Resource Strain:   . Difficulty of Paying Living Expenses:   Food Insecurity:   . Worried  About Charity fundraiser in the Last Year:   . Arboriculturist in the Last Year:   Transportation Needs:   . Film/video editor (Medical):   Marland Kitchen Lack of Transportation (Non-Medical):   Physical Activity:   . Days of Exercise per Week:   . Minutes of Exercise per Session:   Stress:   . Feeling of Stress :   Social Connections:   . Frequency of Communication with Friends and Family:   . Frequency of Social Gatherings with Friends and Family:   . Attends Religious Services:   . Active Member of Clubs or Organizations:   . Attends Archivist Meetings:   Marland Kitchen Marital Status:   Intimate Partner Violence:   . Fear of Current or Ex-Partner:   . Emotionally Abused:   Marland Kitchen Physically Abused:   . Sexually Abused:     Family History  Problem Relation Age of Onset  . Heart disease Mother   . Diabetes Mother   . Emphysema Father   . Emphysema Brother   . Heart disease Brother   . Diabetes Brother   . Heart disease Brother     ROS- All systems are reviewed and negative except as per the HPI above  Physical Exam: Telemetry:  Rate controlled atypical atrial flutter/ afib Vitals:   12/04/19 2300 12/05/19 0714 12/05/19 0806 12/05/19 1451  BP:   (!) 112/44   Pulse: 90 90 65 72  Resp: (!) 22 (!) 21 20 20   Temp:   97.8 F (36.6 C)   TempSrc:   Oral   SpO2: 96% 97% 94% 95%  Weight:      Height:        GEN- The patient is elderly and frail appearing, alert and oriented x 3 today.   Head- normocephalic, atraumatic Eyes-  Sclera clear, conjunctiva pink Ears- hearing intact Oropharynx- clear Neck- + JVD Lungs-  normal work of breathing Heart- irregular rate and rhythm  GI- soft  Extremities- no clubbing, cyanosis, or edema MS-  Diffuse atrophy Skin- no rash or lesion Psych- euthymic mood, full affect Neuro- strength and sensation are intact  EKG- sinus with PACs  Labs:   Lab Results  Component Value Date   WBC 9.7 12/05/2019   HGB 9.6 (L) 12/05/2019   HCT 31.5  (L) 12/05/2019   MCV 92.6 12/05/2019   PLT 138 (L) 12/05/2019    Recent Labs  Lab 12/04/19 1041 12/04/19 2329 12/05/19 0314  NA 134*   < > 137  K 4.2   < > 3.3*  CL 100   < > 104  CO2 22   < > 25  BUN 29*   < > 19  CREATININE 1.04*   < >  0.79  CALCIUM 8.2*   < > 8.2*  PROT 5.5*  --   --   BILITOT 1.0  --   --   ALKPHOS 47  --   --   ALT 41  --   --   AST 39  --   --   GLUCOSE 229*   < > 235*   < > = values in this interval not displayed.   No results found for: CKTOTAL, CKMB, CKMBINDEX, TROPONINI No results found for: CHOL No results found for: HDL No results found for: LDLCALC No results found for: TRIG No results found for: CHOLHDL No results found for: LDLDIRECT    Radiology:  Chest CT shows pulmonary edema  Echo:  EF 60%, modererate LA enlargement, mild MR  ASSESSMENT AND PLAN:   1. Acute hypoxic respiratory failure Clinical picture is consistent with acute diastolic dysfunction rather than infection. This is common post AF ablation. She has marked elevation in her JVP.   I would advise IV diuresis over the next 24 hours. Will given lasix 80mg  IV x 1 now and again in the AM. If  she continues to improve, hopefully she can be discharged in the next 24-48 hours. Given acute CHF with admission, I ask [place] research team to screen for Alleviate HF trial as an outpatient.  2. Persistent afib She remains in the early healing phase post ablation Continue eliquis without interruption. She is rate controlled and intermittently in sinus.  No changes are advised during her admission.  3. hsTn elevation This is secondary to ablation.  No further workup advised  4. HTN Stable No change required today  No further inpatient cardiology workup is planned. Ok to discharge once diuresed.   I will arrange follow-up in AF clinic next week Dr to see over the weekend for EP   Ladona Ridgel, MD 12/05/2019  4:34 PM

## 2019-12-05 NOTE — Progress Notes (Signed)
Results for IMOGINE, CARVELL (MRN [id number]) as of 12/05/2019 13:47  Ref. Range 12/02/2019 05:54 12/02/2019 11:41 12/04/2019 22:08 12/05/2019 07:18 12/05/2019 11:26  Glucose-Capillary Latest Ref Range: 70 - 99 mg/dL 74 224 (H) 114 (H) 643 (H) 290 (H)  Noted that postprandial blood sugars are greater than 200 mg/dl.  Recommend adding Novolog 3 units TID with meals if patient eats at least 50% of meal and blood sugars continue to be elevated.   Smith Mince RN BSN CDE Diabetes Coordinator Pager: [phone]  8am-5pm

## 2019-12-05 NOTE — Progress Notes (Signed)
  Echocardiogram 2D Echocardiogram has been performed.  Virginia Gilbert 12/05/2019, 11:00 AM

## 2019-12-05 NOTE — Progress Notes (Signed)
PROGRESS NOTE  Virginia Gilbert  DOB: 28-May-1941  PCP: Jani Gravel, MD TGG:[id number]  DOA: 12/04/2019  LOS: 1 day   Chief Complaint  Patient presents with  . Shortness of Breath   Brief narrative: Virginia Gilbert is a 79 y.o. female with medical history significant of paroxysmal A. fib, hypertension, IDDM, mitral regurgitation who underwent ablation for chronic A. fib on 4/20.  Patient presented to the ED on 4/22 with complaint of worsening shortness of breath.  Her procedure on 4/20 lasted 2 hours and she felt fine at discharge. 4/21, patient started having shortness of breath associated with chills, no fever, no cough.  Shortness of breath got significant worse with wheezing and occasional cough and hence presented to the ED on 4/22.  In the ED, patient was afebrile, found to be tachypneic, hypoxic requiring 4 L oxygen by nasal cannula. Labs showed WBC count normal at 9.7, renal function normal. Chest x-ray showed bilateral airspace opacities, most confluent involving the upper lobes, indicating airspace pulmonary edema versus pneumonia. CT chest showed interval development of bilateral perihilar airspace disease, favor edema over infection. Initial troponin was elevated to 355, subsequently trended down to 310 and 143 Patient was admitted to hospitalist service for further evaluation and management.  Subjective: Patient was seen and examined this afternoon.  He denies any Caucasian female.  Remains on 5 L oxygen by nasal cannula.  Does not use any at home.  Coughs on deep breathing.  No fever. Daughter and granddaughter at bedside.  Assessment/Plan: Acute hypoxic respiratory failure Multifocal pneumonia versus pulmonary edema.   -Chest x-ray showed bilateral airspace opacities.  CT chest favors edema over infection. -Currently on empiric antibiotics with IV Rocephin and IV azithromycin.  QTc 436 ms -Echocardiogram showed EF of 50 to 55%.  No wall motion abnormality. -Was not  checked for pulmonary embolism but patient reports compliance to Eliquis.  Lactic acidosis -Initial lactic acid elevated to 2, subsequently worsened to 2.4. -Repeat lactic acid this morning shows improvement to 1.3. -Does not seem to be in sepsis.  Elevated troponin -355 >> 310>> 143 -No chest pain, no significant ST-T changes on EKG -normal stress test in Oct 2020, -suspect troponin rise related to ablation 2 days ago, echo did not show any drop in EF or wall motion abnormality. -I called for cardiology consultation.  Paroxysmal A. Fib status post ablation Mitral regurgitation -In and out A. 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  -never had a formal pulmonary function test -On admission, she was noted to have wheezing on admission and hence started on p.o. steroids and inhalers. -Outpatient pulmonology follow-up  Hypertension -Home meds include Coreg, HCTZ, losartan, amlodipine.   -Continue Coreg.  Keep others on hold.  Monitor blood pressure.  Continue as needed hydralazine.    Diabetes mellitus -A1c 6.9 on 4/22 -Hyperglycemia to more than 200 on admission. -Home meds include glimepiride, Metformin, Trulicity,. -Resume glimepiride and Trulicity.  Keep Metformin on hold.   -Continue sliding scale insulin with Accu-Cheks.   Mobility: Encourage ambulation.  Independent prior to admission Code Status:  Full code  DVT prophylaxis:  Eliquis Antimicrobials:  IV Rocephin/IV azithromycin Fluid: None Diet: Cardiac/diabetic diet  Consultants: Cardiology consult called Family Communication:  Daughter and granddaughter at bedside.  Status is: Inpatient  Remains inpatient appropriate because:Ongoing diagnostic testing needed not appropriate for outpatient work up   Dispo: The patient is from: Home  Anticipated d/c is to: Home              Anticipated  d/c date is: 2 days              Patient currently is not medically stable to d/c.   Antimicrobials: Anti-infectives (From admission, onward)   Start     Dose/Rate Route Frequency Ordered Stop   12/05/19 1000  cefTRIAXone (ROCEPHIN) 1 g in sodium chloride 0.9 % 100 mL IVPB     1 g 200 mL/hr over 30 Minutes Intravenous Every 24 hours 12/04/19 1641     12/05/19 1000  azithromycin (ZITHROMAX) tablet 500 mg     500 mg Oral Daily 12/04/19 1642     12/04/19 1300  cefTRIAXone (ROCEPHIN) 2 g in sodium chloride 0.9 % 100 mL IVPB  Status:  Discontinued     2 g 200 mL/hr over 30 Minutes Intravenous Every 24 hours 12/04/19 1132 12/04/19 1641   12/04/19 1300  azithromycin (ZITHROMAX) 500 mg in sodium chloride 0.9 % 250 mL IVPB  Status:  Discontinued     500 mg 250 mL/hr over 60 Minutes Intravenous Every 24 hours 12/04/19 1132 12/04/19 1642        Code Status: Full Code   Diet Order            Diet heart healthy/carb modified Room service appropriate? Yes; Fluid consistency: Thin  Diet effective now              Infusions:  . sodium chloride    . cefTRIAXone (ROCEPHIN)  IV 1 g (12/05/19 0849)    Scheduled Meds: . apixaban  5 mg Oral BID  . azithromycin  500 mg Oral Daily  . carvedilol  3.125 mg Oral BID  . cholecalciferol  5,000 Units Oral QPC lunch  . ezetimibe  10 mg Oral Daily  . insulin aspart  0-5 Units Subcutaneous QHS  . insulin aspart  0-9 Units Subcutaneous TID WC  . ipratropium-albuterol  3 mL Nebulization TID  . losartan  100 mg Oral Daily  . mometasone-formoterol  1 puff Inhalation BID  . omega-3 acid ethyl esters  1 g Oral Daily  . pantoprazole  40 mg Oral Daily  . predniSONE  40 mg Oral Q breakfast  . vitamin B-12  2,500 mcg Oral Q lunch    PRN meds: acetaminophen **OR** acetaminophen, hydrALAZINE, ibuprofen   Objective: Vitals:   12/05/19 0714 12/05/19 0806  BP:  (!) 112/44  Pulse: 90 65  Resp: (!) 21 20  Temp:  97.8 F (36.6 C)  SpO2: 97% 94%     Intake/Output Summary (Last 24 hours) at 12/05/2019 0900 Last data filed at 12/05/2019 0304 Gross per 24 hour  Intake 250 ml  Output 1000 ml  Net -750 ml   Filed Weights   12/04/19 1030  Weight: 68.9 kg   Weight change:  Body mass index is 30.68 kg/m.   Physical Exam: General exam: Appears calm and comfortable.  Not in physical distress Skin: No rashes, lesions or ulcers. HEENT: Atraumatic, normocephalic, supple neck, no obvious bleeding Lungs: Clear to auscultation bilaterally CVS: Regular rate and rhythm, no murmur GI/Abd soft, nontender, nondistended, bowel sound present CNS: Alert, awake monitor x3 Psychiatry: Mood appropriate Extremities: No pedal edema, no calf tenderness  Data Review: I have personally reviewed the laboratory data and studies available.  Recent Labs  Lab 12/04/19 1041 12/05/19 0314  WBC 15.1* 9.7  HGB 10.0* 9.6*  HCT 34.3* 31.5*  MCV 96.3 92.6  PLT 141* 138*   Recent Labs  Lab 12/04/19 1041 12/04/19 2329 12/05/19 0314  NA 134* 137 137  K 4.2 3.5 3.3*  CL 100 103 104  CO2 22 23 25   GLUCOSE 229* 255* 235*  BUN 29* 19 19  CREATININE 1.04* 0.83 0.79  CALCIUM 8.2* 8.2* 8.2*    Signed, , MD [facility] Pager: [phone] (Secure Chat preferred). 12/05/2019

## 2019-12-05 NOTE — Evaluation (Signed)
Physical Therapy Evaluation Patient Details Name: Virginia Gilbert MRN: [id number] DOB: 1940/12/27 Today's Date: 12/05/2019   History of Present Illness  79 yo female admitted to ED On 4/22 with shortness of breath, medical diagnosis of multifocal PNA. Pt with ablation procedure on 4/20 for afib. PMH includes afib, DM, HLD, MVR.  Clinical Impression   Pt presents with dyspnea on exertion, generalized weakness, increased time and effort to mobilize, unsteadiness in standing, and decreased activity tolerance. Pt to benefit from acute PT to address deficits. Pt ambulated room distance with use of HHA +2, requires light assist for steadying this day. PT recommending HHPT to address deficits and return pt to PLOF, as pt is very independent, works, and drives at baseline. PT to progress mobility as tolerated, and will continue to follow acutely.    SpO2 92% and above on 5LO2 via     Follow Up Recommendations Home health PT    Equipment Recommendations  None recommended by PT    Recommendations for Other Services       Precautions / Restrictions Precautions Precautions: Fall Restrictions Weight Bearing Restrictions: No      Mobility  Bed Mobility Overal bed mobility: Needs Assistance Bed Mobility: Supine to Sit;Sit to Supine     Supine to sit: Min guard;HOB elevated Sit to supine: Min assist;HOB elevated   General bed mobility comments: Min guard for supine to sit, lines and leads management by PT/OT. Light assist for LE lifting into bed upon return to supine, increased time and effort.  Transfers Overall transfer level: Needs assistance Equipment used: 2 person hand held assist Transfers: Sit to/from Stand Sit to Stand: Min assist;From elevated surface         General transfer comment: Min assist +2 HHA for power up, steadying upon standing. Increased time to rise from EOB. Stood with min +1 from toilet for rise.  Ambulation/Gait Ambulation/Gait assistance: Min assist;+2  safety/equipment Gait Distance (Feet): 20 Feet Assistive device: 2 person hand held assist Gait Pattern/deviations: Step-through pattern;Decreased stride length;Trunk flexed;Shuffle Gait velocity: decr   General Gait Details: min assist to steady, with +1 HHA pt reaching for environment. verbal cuing for taking her time as pt taking short, quick, shuffling to toilet secondary to urinary incontinence.  Stairs            Wheelchair Mobility    Modified Rankin (Stroke Patients Only)       Balance Overall balance assessment: Needs assistance Sitting-balance support: No upper extremity supported;Feet supported Sitting balance-Leahy Scale: Fair     Standing balance support: Bilateral upper extremity supported Standing balance-Leahy Scale: Poor Standing balance comment: unsteady, reliant on PT/OT HHA                             Pertinent Vitals/Pain Pain Assessment: Faces Faces Pain Scale: Hurts a little bit Pain Location: generalized Pain Intervention(s): Limited activity within patient's tolerance;Monitored during session    Pace expects to be discharged to:: Private residence Living Arrangements: Children(son that works) Available Help at Discharge: Family;Available 24 hours/day Type of Home: House Home Access: Stairs to enter Entrance Stairs-Rails: Right;Left;Can reach both Entrance Stairs-Number of Steps: 6 Home Layout: One level Home Equipment: Shower seat;Grab bars - tub/shower;Bedside commode;Walker - 2 wheels;Cane - single point      Prior Function Level of Independence: Independent         Comments: drives, she works as Psychologist, counselling  Dominant Hand: Right    Extremity/Trunk Assessment   Upper Extremity Assessment Upper Extremity Assessment: Defer to OT evaluation    Lower Extremity Assessment Lower Extremity Assessment: Generalized weakness    Cervical / Trunk Assessment Cervical / Trunk  Assessment: Kyphotic  Communication   Communication: No difficulties  Cognition Arousal/Alertness: Awake/alert Behavior During Therapy: WFL for tasks assessed/performed Overall Cognitive Status: Within Functional Limits for tasks assessed                                        General Comments General comments (skin integrity, edema, etc.): SpO2 92% and above on 5LO2 via [place].    Exercises     Assessment/Plan    PT Assessment Patient needs continued PT services  PT Problem List Decreased strength;Decreased mobility;Decreased activity tolerance;Decreased balance;Decreased knowledge of use of DME;Cardiopulmonary status limiting activity       PT Treatment Interventions DME instruction;Therapeutic activities;Gait training;Therapeutic exercise;Patient/family education;Stair training;Balance training;Functional mobility training;Neuromuscular re-education    PT Goals (Current goals can be found in the Care Plan section)  Acute Rehab PT Goals Patient Stated Goal: go home, return to independence PT Goal Formulation: With patient Time For Goal Achievement: 12/19/19 Potential to Achieve Goals: Good    Frequency Min 3X/week   Barriers to discharge        Co-evaluation PT/OT/SLP Co-Evaluation/Treatment: Yes Reason for Co-Treatment: For patient/therapist safety;To address functional/ADL transfers PT goals addressed during session: Mobility/safety with mobility         AM-PAC PT "6 Clicks" Mobility  Outcome Measure Help needed turning from your back to your side while in a flat bed without using bedrails?: None Help needed moving from lying on your back to sitting on the side of a flat bed without using bedrails?: A Little Help needed moving to and from a bed to a chair (including a wheelchair)?: A Little Help needed standing up from a chair using your arms (e.g., wheelchair or bedside chair)?: A Little Help needed to walk in hospital room?: A Little Help needed  climbing 3-5 steps with a railing? : A Little 6 Click Score: 19    End of Session Equipment Utilized During Treatment: Oxygen Activity Tolerance: Patient tolerated treatment well;Patient limited by fatigue Patient left: in bed;with call bell/phone within reach;Other (comment)(pt going to echo) Nurse Communication: Mobility status PT Visit Diagnosis: Other abnormalities of gait and mobility (R26.89);Muscle weakness (generalized) (M62.81)    Time: 7209-4709 PT Time Calculation (min) (ACUTE ONLY): 17 min   Charges:   PT Evaluation $PT Eval Low Complexity: 1 Low          Yohanna Tow E, PT Acute Rehabilitation Services Pager [phone]  Office [phone]  [place]aklen Thiam D Despina Hidden 12/05/2019, 11:33 AM

## 2019-12-06 DIAGNOSIS — I5033 Acute on chronic diastolic (congestive) heart failure: Secondary | ICD-10-CM

## 2019-12-06 LAB — BASIC METABOLIC PANEL
Anion gap: 8 (ref 5–15)
BUN: 18 mg/dL (ref 8–23)
CO2: 29 mmol/L (ref 22–32)
Calcium: 8.3 mg/dL — ABNORMAL LOW (ref 8.9–10.3)
Chloride: 101 mmol/L (ref 98–111)
Creatinine, Ser: 0.81 mg/dL (ref 0.44–1.00)
GFR calc Af Amer: >60 mL/min (ref >60)
GFR calc non Af Amer: >60 mL/min (ref >60)
Glucose, Bld: 252 mg/dL — ABNORMAL HIGH (ref 70–99)
Potassium: 3.5 mmol/L (ref 3.5–5.1)
Sodium: 138 mmol/L (ref 135–145)

## 2019-12-06 LAB — GLUCOSE, CAPILLARY
Glucose-Capillary: 171 mg/dL — ABNORMAL HIGH (ref 70–99)
Glucose-Capillary: 195 mg/dL — ABNORMAL HIGH (ref 70–99)
Glucose-Capillary: 245 mg/dL — ABNORMAL HIGH (ref 70–99)
Glucose-Capillary: 271 mg/dL — ABNORMAL HIGH (ref 70–99)
Glucose-Capillary: 43 mg/dL — CL (ref 70–99)
Glucose-Capillary: 76 mg/dL (ref 70–99)

## 2019-12-06 LAB — PROCALCITONIN: Procalcitonin: 0.23 ng/mL

## 2019-12-06 MED ORDER — POTASSIUM CHLORIDE CRYS ER 20 MEQ PO TBCR
40.0000 meq | EXTENDED_RELEASE_TABLET | Freq: Once | ORAL | Status: AC
  Administered 2019-12-06: 40 meq via ORAL
  Filled 2019-12-06: qty 2

## 2019-12-06 NOTE — TOC Progression Note (Signed)
PT was ID by name MRN and DOB

## 2019-12-06 NOTE — Progress Notes (Signed)
Patient not wearing Bipap at night for two nights. J Nachman Sundt RT

## 2019-12-06 NOTE — Progress Notes (Signed)
PROGRESS NOTE  Virginia Gilbert  DOB: 1941-04-13  PCP: Jani Gravel, MD SWF:[id number]  DOA: 12/04/2019  LOS: 2 days   Chief Complaint  Patient presents with  . Shortness of Breath   Brief narrative: Virginia Gilbert is a 79 y.o. female with medical history significant of paroxysmal A. fib, hypertension, IDDM, mitral regurgitation who underwent ablation for chronic A. fib on 4/20.  Patient presented to the ED on 4/22 with complaint of worsening shortness of breath.  Her procedure on 4/20 lasted 2 hours and she felt fine at discharge. 4/21, patient started having shortness of breath associated with chills, no fever, no cough.  Shortness of breath got significant worse with wheezing and occasional cough and hence presented to the ED on 4/22.  In the ED, patient was afebrile, found to be tachypneic, hypoxic requiring 4 L oxygen by nasal cannula. Labs showed WBC count normal at 9.7, renal function normal. Chest x-ray showed bilateral airspace opacities, most confluent involving the upper lobes, indicating airspace pulmonary edema versus pneumonia. CT chest showed interval development of bilateral perihilar airspace disease, favor edema over infection. Initial troponin was elevated to 355, subsequently trended down to 310 and 143 Patient was admitted to hospitalist service for further evaluation and management.  Subjective: Patient was seen and examined this afternoon.   Feels tired today.  On 2 L oxygen by nasal cannula.   On diuresis with IV Lasix.  Granddaughter at bedside.  Assessment/Plan: Acute hypoxic respiratory failure Likely acute diastolic CHF exacerbation. -Chest x-ray showed bilateral airspace opacities.  CT chest favors edema over infection. -Cardiology consult appreciated.  CHF favored more than infection. -Currently on empiric antibiotics with IV Rocephin and IV azithromycin.  I will stop antibiotics today. -Echocardiogram showed EF of 50 to 55%.  No wall motion  abnormality. -Was not checked for pulmonary embolism but patient reports compliance to Eliquis.  Lactic acidosis -Initial lactic acid elevated to 2, subsequently worsened to 2.4. -Repeat lactic acid this morning shows improvement to 1.3. -Does not seem to be in sepsis.  Elevated troponin -355 >> 310>> 143 -No chest pain, no significant ST-T changes on EKG -normal stress test in Oct 2020, -suspect troponin rise related to ablation 2 days ago, echo did not show any drop in EF or wall motion abnormality. -cardiology consultation appreciated.  Paroxysmal A. Fib status post ablation Mitral regurgitation -In and out A. 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  -never had a formal pulmonary function test -On admission, she was noted to have wheezing on admission and hence started on p.o. steroids and inhalers. -Outpatient pulmonology follow-up  Hypertension -Home meds include Coreg, HCTZ, losartan, amlodipine.   -Continue Coreg and IV Lasix.  Keep others on hold.  Monitor blood pressure.  Continue as needed hydralazine.    Diabetes mellitus -A1c 6.9 on 4/22 -Hyperglycemia to more than 200 on admission. -Home meds include glimepiride, Metformin, Trulicity,. -Resume glimepiride and Trulicity.  Keep Metformin on hold.   -Continue sliding scale insulin with Accu-Cheks.   Mobility: Encourage ambulation.  Independent prior to admission Code Status:  Full code  DVT prophylaxis:  Eliquis Antimicrobials:  Stop IV antibiotics today Fluid: None Diet: Cardiac/diabetic diet  Consultants: Cardiology consult called Family Communication:  Daughter and granddaughter at bedside.  Status is: Inpatient  Remains inpatient appropriate because:Ongoing diagnostic testing needed not appropriate for outpatient work up   Dispo: The patient is from: Home  Anticipated d/c is to:  Home              Anticipated d/c date is: 2 days              Patient currently is not medically stable to d/c.   Antimicrobials: Anti-infectives (From admission, onward)   Start     Dose/Rate Route Frequency Ordered Stop   12/05/19 1000  cefTRIAXone (ROCEPHIN) 1 g in sodium chloride 0.9 % 100 mL IVPB  Status:  Discontinued     1 g 200 mL/hr over 30 Minutes Intravenous Every 24 hours 12/04/19 1641 12/06/19 1457   12/05/19 1000  azithromycin (ZITHROMAX) tablet 500 mg  Status:  Discontinued     500 mg Oral Daily 12/04/19 1642 12/06/19 1457   12/04/19 1300  cefTRIAXone (ROCEPHIN) 2 g in sodium chloride 0.9 % 100 mL IVPB  Status:  Discontinued     2 g 200 mL/hr over 30 Minutes Intravenous Every 24 hours 12/04/19 1132 12/04/19 1641   12/04/19 1300  azithromycin (ZITHROMAX) 500 mg in sodium chloride 0.9 % 250 mL IVPB  Status:  Discontinued     500 mg 250 mL/hr over 60 Minutes Intravenous Every 24 hours 12/04/19 1132 12/04/19 1642        Code Status: Full Code   Diet Order            Diet heart healthy/carb modified Room service appropriate? Yes; Fluid consistency: Thin  Diet effective now              Infusions:  . sodium chloride      Scheduled Meds: . apixaban  5 mg Oral BID  . carvedilol  3.125 mg Oral BID  . cholecalciferol  5,000 Units Oral QPC lunch  . Dulaglutide  1.5 mg Subcutaneous Q Fri  . ezetimibe  10 mg Oral Daily  . glimepiride  4 mg Oral BID WC  . insulin aspart  0-5 Units Subcutaneous QHS  . insulin aspart  0-9 Units Subcutaneous TID WC  . ipratropium-albuterol  3 mL Nebulization TID  . losartan  100 mg Oral Daily  . mometasone-formoterol  1 puff Inhalation BID  . omega-3 acid ethyl esters  1 g Oral Daily  . pantoprazole  40 mg Oral Daily  . vitamin B-12  2,500 mcg Oral Q lunch    PRN meds: acetaminophen **OR** acetaminophen, hydrALAZINE   Objective: Vitals:   12/06/19 0722 12/06/19 0741  BP:  (!) 119/50  Pulse: 80 (!) 59  Resp: 18 16  Temp:   97.9 F (36.6 C)  SpO2: 97% 90%    Intake/Output Summary (Last 24 hours) at 12/06/2019 1459 Last data filed at 12/06/2019 1100 Gross per 24 hour  Intake --  Output 3400 ml  Net -3400 ml   Filed Weights   12/04/19 1030  Weight: 68.9 kg   Weight change:  Body mass index is 30.68 kg/m.   Physical Exam: General exam: Appears calm and comfortable.  Not in physical distress Skin: No rashes, lesions or ulcers. HEENT: Atraumatic, normocephalic, supple neck, no obvious bleeding Lungs: Clear to auscultation bilaterally. CVS: Regular rate and rhythm, no murmur GI/Abd soft, nontender, nondistended, bowel sound present CNS: Alert, awake monitor x3 Psychiatry: Mood appropriate Extremities: No pedal edema, no calf tenderness  Data Review: I have personally reviewed the laboratory data and studies available.  Recent Labs  Lab 12/04/19 1041 12/05/19 0314  WBC 15.1* 9.7  HGB 10.0* 9.6*  HCT 34.3* 31.5*  MCV 96.3 92.6  PLT 141* 138*   Recent Labs  Lab 12/04/19 1041 12/04/19 2329 12/05/19 0314 12/06/19 0539  NA 134* 137 137 138  K 4.2 3.5 3.3* 3.5  CL 100 103 104 101  CO2 22 23 25 29   GLUCOSE 229* 255* 235* 252*  BUN 29* 19 19 18   CREATININE 1.04* 0.83 0.79 0.81  CALCIUM 8.2* 8.2* 8.2* 8.3*    Signed, , MD [facility] Pager: [phone] (Secure Chat preferred). 12/06/2019

## 2019-12-06 NOTE — Progress Notes (Addendum)
Progress Note  Patient Name: Virginia MillerLillie M Colucci Date of Encounter: 12/06/2019  Primary Cardiologist: No primary care provider on file.   Subjective   "I am breathing better."  Inpatient Medications    Scheduled Meds: . apixaban  5 mg Oral BID  . azithromycin  500 mg Oral Daily  . carvedilol  3.125 mg Oral BID  . cholecalciferol  5,000 Units Oral QPC lunch  . Dulaglutide  1.5 mg Subcutaneous Q Fri  . ezetimibe  10 mg Oral Daily  . glimepiride  4 mg Oral BID WC  . insulin aspart  0-5 Units Subcutaneous QHS  . insulin aspart  0-9 Units Subcutaneous TID WC  . ipratropium-albuterol  3 mL Nebulization TID  . losartan  100 mg Oral Daily  . mometasone-formoterol  1 puff Inhalation BID  . omega-3 acid ethyl esters  1 g Oral Daily  . pantoprazole  40 mg Oral Daily  . potassium chloride  40 mEq Oral Once  . vitamin B-12  2,500 mcg Oral Q lunch   Continuous Infusions: . sodium chloride    . cefTRIAXone (ROCEPHIN)  IV 1 g (12/06/19 0935)   PRN Meds: acetaminophen **OR** acetaminophen, hydrALAZINE   Vital Signs    Vitals:   12/05/19 1959 12/05/19 2232 12/06/19 0722 12/06/19 0741  BP:  124/60  (!) 119/50  Pulse:  80 80 (!) 59  Resp:  16 18 16   Temp:  97.9 F (36.6 C)  97.9 F (36.6 C)  TempSrc:  Oral    SpO2: 95% 96% 97% 90%  Weight:      Height:        Intake/Output Summary (Last 24 hours) at 12/06/2019 1049 Last data filed at 12/06/2019 0300 Gross per 24 hour  Intake --  Output 1400 ml  Net -1400 ml   Filed Weights   12/04/19 1030  Weight: 68.9 kg    Telemetry    Atypical atrial flutter with a controlled VR - Personally Reviewed  ECG    none - Personally Reviewed  Physical Exam   GEN: No acute distress.   Neck: 7 cm JVD Cardiac: RRR, no murmurs, rubs, or gallops.  Respiratory: Clear to auscultation bilaterally. GI: Soft, nontender, non-distended  MS: No edema; No deformity. Neuro:  Nonfocal  Psych: Normal affect   Labs    Chemistry Recent Labs   Lab 12/04/19 1041 12/04/19 1041 12/04/19 2329 12/05/19 0314 12/06/19 0539  NA 134*   < > 137 137 138  K 4.2   < > 3.5 3.3* 3.5  CL 100   < > 103 104 101  CO2 22   < > 23 25 29   GLUCOSE 229*   < > 255* 235* 252*  BUN 29*   < > 19 19 18   CREATININE 1.04*   < > 0.83 0.79 0.81  CALCIUM 8.2*   < > 8.2* 8.2* 8.3*  PROT 5.5*  --   --   --   --   ALBUMIN 3.1*  --   --   --   --   AST 39  --   --   --   --   ALT 41  --   --   --   --   ALKPHOS 47  --   --   --   --   BILITOT 1.0  --   --   --   --   GFRNONAA 52*   < > >60 >60 >60  GFRAA >60   < > >  60 >60 >60  ANIONGAP 12   < > 11 8 8    < > = values in this interval not displayed.     Hematology Recent Labs  Lab 12/04/19 1041 12/05/19 0314  WBC 15.1* 9.7  RBC 3.56* 3.40*  HGB 10.0* 9.6*  HCT 34.3* 31.5*  MCV 96.3 92.6  MCH 28.1 28.2  MCHC 29.2* 30.5  RDW 15.2 15.3  PLT 141* 138*    Cardiac EnzymesNo results for input(s): TROPONINI in the last 168 hours. No results for input(s): TROPIPOC in the last 168 hours.   BNP Recent Labs  Lab 12/04/19 1030  BNP 217.9*     DDimer  Recent Labs  Lab 12/04/19 1041  DDIMER 0.42     Radiology    CT CHEST WO CONTRAST  Result Date: 12/04/2019 CLINICAL DATA:  Hypoxemia, recent cardiac ablation for atrial fibrillation EXAM: CT CHEST WITHOUT CONTRAST TECHNIQUE: Multidetector CT imaging of the chest was performed following the standard protocol without IV contrast. COMPARISON:  12/04/2019, 11/25/2019 FINDINGS: Cardiovascular: Unenhanced imaging of the heart and great vessels demonstrates no pericardial effusion. Stable calcification of the mitral and aortic valves. Mild atherosclerosis of the aorta and coronary vessels. Thoracic aorta is normal in caliber. Mediastinum/Nodes: Multiple subcentimeter mediastinal lymph nodes are seen, new since prior study and likely reactive. Thyroid, trachea, and esophagus are grossly unremarkable. Lungs/Pleura: Since the previous exam, bilateral  perihilar airspace disease has developed, greatest in the upper lung zones. There is interlobular septal thickening. Trace bilateral pleural effusions. No pneumothorax. Central airways are patent. Upper Abdomen: No acute abnormality. 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 Musculoskeletal: No acute or destructive bony lesions. Reconstructed images demonstrate no additional findings. IMPRESSION: 1. Interval development of bilateral perihilar airspace disease the, favor edema over infection. 2.  Aortic Atherosclerosis (ICD10-I70.0). Electronically Signed   By: 11/27/2019 M.D.   On: 12/04/2019 19:14   DG CHEST PORT 1 VIEW  Result Date: 12/05/2019 CLINICAL DATA:  79 year old female with increasing shortness of breath, abnormal pulmonary auscultation. Status post Lasix x1. recent cardiac ablation. EXAM: PORTABLE CHEST 1 VIEW COMPARISON:  Chest CT and radiographs 12/04/2019 and earlier. FINDINGS: Portable AP semi upright view at 0633 hours. Continued low lung volumes. Stable cardiac size and mediastinal contours. Visualized tracheal air column is within normal limits. No change since yesterday in bilateral confluent pulmonary interstitial opacity, corresponding to upper lobe predominant bilateral peribronchial mostly ground-glass opacity on CT. No pleural effusion is evident. No areas of worsening ventilation. Paucity of bowel gas. No acute osseous abnormality identified. IMPRESSION: Unchanged bilateral pulmonary opacity since yesterday. No pleural fluid is evident. Consider viral/atypical infection if the patient does not respond to diuresis. Electronically Signed   By: 12/06/2019 M.D.   On: 12/05/2019 08:28   DG Chest Port 1 View  Result Date: 12/04/2019 CLINICAL DATA:  79 year old who is postprocedure day 2 from a cardiac ablation for atrial fibrillation. She presents with acute onset of cough and shortness of breath that began yesterday. EXAM:  PORTABLE CHEST 1 VIEW COMPARISON:  CT heart 11/25/2019 and chest x-ray 03/31/2019. FINDINGS: Cardiac silhouette upper normal in size to slightly enlarged. Airspace opacities throughout both lungs, most confluent in the upper lobes. Chronic elevation of the RIGHT hemidiaphragm. No visible pleural effusions. IMPRESSION: Bilateral airspace opacities, most confluent involving the upper lobes, indicating airspace pulmonary edema versus pneumonia. Electronically Signed   By: 04/02/2019 M.D.   On:  12/04/2019 11:21   ECHOCARDIOGRAM COMPLETE  Result Date: 12/05/2019    ECHOCARDIOGRAM REPORT   Patient Name:   Virginia Gilbert Date of Exam: 12/05/2019 Medical Rec #:  [id number]       Height:       59.0 in Accession #:    [id number]      Weight:       151.9 lb Date of Birth:  [birth year]        BSA:          1.641 m Patient Age:    58 years        BP:           112/44 mmHg Patient Gender: F               HR:           65 bpm. Exam Location:  Inpatient Procedure: 2D Echo, Cardiac Doppler and Color Doppler Indications:    Dyspnea  History:        Patient has prior history of Echocardiogram examinations, most                 recent 10/08/2019. Mitral Valve Disease and MR, Arrythmias:Atrial                 Fibrillation, Signs/Symptoms:Shortness of Breath; Risk                 Factors:Diabetes and Hypertension.  Sonographer:    Dustin Flock Referring Phys: [id number] [place]  1. Patient appears to be in atrial flutter throughout exam.  2. Left ventricular ejection fraction, by estimation, is 60 to 65%. The left ventricle has normal function. The left ventricle has no regional wall motion abnormalities. Left ventricular diastolic parameters are indeterminate.  3. Right ventricular systolic function is normal. The right ventricular size is normal. There is mildly elevated pulmonary artery systolic pressure.  4. Left atrial size was moderately dilated.  5. The mitral valve is normal in structure. Mild mitral  valve regurgitation. No evidence of mitral stenosis.  6. The aortic valve is tricuspid. Aortic valve regurgitation is trivial. Mild to moderate aortic valve sclerosis/calcification is present, without any evidence of aortic stenosis.  7. The inferior vena cava is normal in size with greater than 50% respiratory variability, suggesting right atrial pressure of 3 mmHg. FINDINGS  Left Ventricle: Left ventricular ejection fraction, by estimation, is 60 to 65%. The left ventricle has normal function. The left ventricle has no regional wall motion abnormalities. The left ventricular internal cavity size was normal in size. There is  no left ventricular hypertrophy. Left ventricular diastolic parameters are indeterminate. Right Ventricle: The right ventricular size is normal. No increase in right ventricular wall thickness. Right ventricular systolic function is normal. There is mildly elevated pulmonary artery systolic pressure. The tricuspid regurgitant velocity is 2.82  m/s, and with an assumed right atrial pressure of 3 mmHg, the estimated right ventricular systolic pressure is 48.5 mmHg. Left Atrium: Left atrial size was moderately dilated. Right Atrium: Right atrial size was normal in size. Pericardium: There is no evidence of pericardial effusion. Mitral Valve: The mitral valve is normal in structure. There is mild thickening of the mitral valve leaflet(s). There is mild calcification of the mitral valve leaflet(s). Normal mobility of the mitral valve leaflets. Moderate mitral annular calcification. Mild mitral valve regurgitation. No evidence of mitral valve stenosis. Tricuspid Valve: The tricuspid valve is normal in structure. Tricuspid valve regurgitation is mild . No  evidence of tricuspid stenosis. Aortic Valve: The aortic valve is tricuspid. Aortic valve regurgitation is trivial. Mild to moderate aortic valve sclerosis/calcification is present, without any evidence of aortic stenosis. Pulmonic Valve: The  pulmonic valve was normal in structure. Pulmonic valve regurgitation is not visualized. No evidence of pulmonic stenosis. Aorta: The aortic root is normal in size and structure. Venous: The inferior vena cava is normal in size with greater than 50% respiratory variability, suggesting right atrial pressure of 3 mmHg. IAS/Shunts: No atrial level shunt detected by color flow Doppler. Additional Comments: Patient appears to be in atrial flutter throughout exam.  LEFT VENTRICLE PLAX 2D LVIDd:         4.60 cm  Diastology LVIDs:         2.40 cm  LV e' lateral:   8.27 cm/s LV PW:         1.00 cm  LV E/e' lateral: 11.8 LV IVS:        1.00 cm  LV e' medial:    7.62 cm/s LVOT diam:     1.70 cm  LV E/e' medial:  12.8 LV SV:         54 LV SV Index:   33 LVOT Area:     2.27 cm  RIGHT VENTRICLE RV Basal diam:  2.80 cm RV S prime:     12.90 cm/s TAPSE (M-mode): 3.1 cm LEFT ATRIUM           Index       RIGHT ATRIUM           Index LA diam:      4.10 cm 2.50 cm/m  RA Area:     14.20 cm LA Vol (A4C): 91.9 ml 56.01 ml/m RA Volume:   32.50 ml  19.81 ml/m  AORTIC VALVE LVOT Vmax:   122.00 cm/s LVOT Vmean:  73.900 cm/s LVOT VTI:    0.239 m  AORTA Ao Root diam: 2.40 cm MITRAL VALVE               TRICUSPID VALVE MV Area (PHT): 3.27 cm    TR Peak grad:   31.8 mmHg MV Decel Time: 232 msec    TR Vmax:        282.00 cm/s MV E velocity: 97.40 cm/s MV A velocity: 39.90 cm/s  SHUNTS MV E/A ratio:  2.44        Systemic VTI:  0.24 m                            Systemic Diam: 1.70 cm Charlton Haws MD Electronically signed by Charlton Haws MD Signature Date/Time: 12/05/2019/11:13:09 AM    Final     Cardiac Studies   2D echo reviewed  Patient Profile     79 y.o. female admitted with volume overload and atypical atrial flutter after atrial fib ablation.  Assessment & Plan    1. Acute on chronic diastolic heart failure - we do not have an accurate weight yet. I have asked her to be weighed. She will walk in the halls. 2. Atrial fib - she  appears to be in atypical atrial flutter but her rate is well controlled. We will follow. 3. Obesity - weight loss will need to be considered.  4. Disp. - she can be discharged home later today if she is able to ambulate without too much difficulty.  For questions or updates, please contact [facility] Please consult [email or web address] for contact  info under Cardiology/STEMI.   Signed, Lewayne Bunting, MD  12/06/2019, 10:49 AM  Patient ID: Virginia Gilbert, female   DOB: September 09, 1940, 79 y.o.   MRN: [id number]

## 2019-12-07 DIAGNOSIS — I484 Atypical atrial flutter: Secondary | ICD-10-CM

## 2019-12-07 DIAGNOSIS — I5031 Acute diastolic (congestive) heart failure: Secondary | ICD-10-CM

## 2019-12-07 LAB — GLUCOSE, CAPILLARY
Glucose-Capillary: 39 mg/dL — CL (ref 70–99)
Glucose-Capillary: 49 mg/dL — ABNORMAL LOW (ref 70–99)
Glucose-Capillary: 133 mg/dL — ABNORMAL HIGH (ref 70–99)
Glucose-Capillary: 68 mg/dL — ABNORMAL LOW (ref 70–99)
Glucose-Capillary: 221 mg/dL — ABNORMAL HIGH (ref 70–99)

## 2019-12-07 MED ORDER — FUROSEMIDE 20 MG PO TABS
20.0000 mg | ORAL_TABLET | Freq: Every day | ORAL | 0 refills | Status: DC
Start: 2019-12-07 — End: 2019-12-18

## 2019-12-07 MED ORDER — GLIMEPIRIDE 4 MG PO TABS: 4.0000 mg | ORAL_TABLET | Freq: Every day | ORAL | 1 refills | Status: AC

## 2019-12-07 MED ORDER — DEXTROSE 50 % IV SOLN: 12.5000 g | Freq: Once | INTRAVENOUS | Status: DC

## 2019-12-07 MED ORDER — IPRATROPIUM-ALBUTEROL 0.5-2.5 (3) MG/3ML IN SOLN: 3.0000 mL | Freq: Four times a day (QID) | RESPIRATORY_TRACT | Status: DC | PRN

## 2019-12-07 NOTE — Progress Notes (Addendum)
2143 Blood sugar was 43, pt alert and was given juice with sugar, will recheck after 15 minutes.   2210 Blood sugar recheck was 76, pt eating snacks that daughter brought in for her earlier and having Sprite soda.   2349 Blood sugar recheck 171, resting in bed with no acute distress noted, will cont to monitor.  40 Dr Rana Snare notified of events and that standing orders were utilized.

## 2019-12-07 NOTE — Progress Notes (Signed)
Received referral to assist with HH O2. Contacted Jermaine with [employer] for referral. He accepted the referral.

## 2019-12-07 NOTE — Progress Notes (Signed)
Progress Note  Patient Name: Virginia Gilbert Date of Encounter: 12/07/2019  Primary Cardiologist:  Acharya/Allred  Subjective   Repeat well.  No edema.  Eager to go home. Remains in atypical atrial flutter with ventricular rate in the 60s.  Inpatient Medications    Scheduled Meds: . apixaban  5 mg Oral BID  . carvedilol  3.125 mg Oral BID  . cholecalciferol  5,000 Units Oral QPC lunch  . dextrose  12.5 g Intravenous Once  . ezetimibe  10 mg Oral Daily  . insulin aspart  0-5 Units Subcutaneous QHS  . insulin aspart  0-9 Units Subcutaneous TID WC  . losartan  100 mg Oral Daily  . mometasone-formoterol  1 puff Inhalation BID  . omega-3 acid ethyl esters  1 g Oral Daily  . pantoprazole  40 mg Oral Daily  . vitamin B-12  2,500 mcg Oral Q lunch   Continuous Infusions: . sodium chloride     PRN Meds: acetaminophen **OR** acetaminophen, hydrALAZINE, ipratropium-albuterol   Vital Signs    Vitals:   12/06/19 2245 12/06/19 2332 12/07/19 0802 12/07/19 0832  BP:  (!) 110/52 (!) 133/56   Pulse: 78 80 (!) 57 61  Resp: 18 18 16 18   Temp:  98 F (36.7 C) 98.4 F (36.9 C)   TempSrc:  Oral    SpO2: 98% 97% 95% 96%  Weight:      Height:       No intake or output data in the 24 hours ending 12/07/19 1318 Last 3 Weights 12/04/2019 12/02/2019 10/22/2019  Weight (lbs) 151 lb 14.4 oz 152 lb 157 lb  Weight (kg) 68.9 kg 68.947 kg 71.215 kg      Telemetry    Atypical atrial flutter, rate controlled- Personally Reviewed  ECG    No new tracings- Personally Reviewed  Physical Exam  Mildly obese GEN: No acute distress.   Neck:  4-5 cm JVD Cardiac:  Irregular, no murmurs, rubs, or gallops.  Respiratory: Clear to auscultation bilaterally. GI: Soft, nontender, non-distended  MS: No edema; No deformity. Neuro:  Nonfocal  Psych: Normal affect   Labs    High Sensitivity Troponin:   Recent Labs  Lab 12/04/19 1041 12/04/19 1455 12/05/19 0510  TROPONINIHS 355* 310* 143*        Chemistry Recent Labs  Lab 12/04/19 1041 12/04/19 1041 12/04/19 2329 12/05/19 0314 12/06/19 0539  NA 134*   < > 137 137 138  K 4.2   < > 3.5 3.3* 3.5  CL 100   < > 103 104 101  CO2 22   < > 23 25 29   GLUCOSE 229*   < > 255* 235* 252*  BUN 29*   < > 19 19 18   CREATININE 1.04*   < > 0.83 0.79 0.81  CALCIUM 8.2*   < > 8.2* 8.2* 8.3*  PROT 5.5*  --   --   --   --   ALBUMIN 3.1*  --   --   --   --   AST 39  --   --   --   --   ALT 41  --   --   --   --   ALKPHOS 47  --   --   --   --   BILITOT 1.0  --   --   --   --   GFRNONAA 52*   < > >60 >60 >60  GFRAA >60   < > >60 >60 >60  ANIONGAP 12   < > 11 8 8    < > = values in this interval not displayed.     Hematology Recent Labs  Lab 12/04/19 1041 12/05/19 0314  WBC 15.1* 9.7  RBC 3.56* 3.40*  HGB 10.0* 9.6*  HCT 34.3* 31.5*  MCV 96.3 92.6  MCH 28.1 28.2  MCHC 29.2* 30.5  RDW 15.2 15.3  PLT 141* 138*    BNP Recent Labs  Lab 12/04/19 1030  BNP 217.9*     DDimer  Recent Labs  Lab 12/04/19 1041  DDIMER 0.42     Radiology    No results found.  Cardiac Studies   1. Patient appears to be in atrial flutter throughout exam.  2. Left ventricular ejection fraction, by estimation, is 60 to 65%. The  left ventricle has normal function. The left ventricle has no regional  wall motion abnormalities. Left ventricular diastolic parameters are  indeterminate.  3. Right ventricular systolic function is normal. The right ventricular  size is normal. There is mildly elevated pulmonary artery systolic  pressure.  4. Left atrial size was moderately dilated.  5. The mitral valve is normal in structure. Mild mitral valve  regurgitation. No evidence of mitral stenosis.  6. The aortic valve is tricuspid. Aortic valve regurgitation is trivial.  Mild to moderate aortic valve sclerosis/calcification is present, without  any evidence of aortic stenosis.  7. The inferior vena cava is normal in size with greater than  50%  respiratory variability, suggesting right atrial pressure of 3 mmHg.   Patient Profile     79 y.o. female with an episode of diastolic heart failure exacerbation and atypical atrial flutter, early following atrial fibrillation ablation.  Assessment & Plan    Diuresis 4 L since admission. Has subtle evidence of elevated right heart pressures on physical exam, but by echo  the inferior vena cava was not dilated. I would recommend discharging home on furosemide 40 mg once daily and potassium chloride 20 mEq daily.  She has a follow-up visit scheduled for Wednesday the 28th when we can recheck her renal parameters and potassium level. Advised her to weigh herself as soon as she gets home and call us if she gains more than 3 pounds from that weight over the next few days. Continue current dose of beta-blocker and anticoagulant. [facility] will sign off.   Medication Recommendations: Carvedilol 3.25 mg twice daily, apixaban 5 mg twice daily, furosemide 40 mg once daily, potassium chloride 20 mEq equivalents daily Other recommendations (labs, testing, etc): Daily weights, call for 3 pound weight increase from today's weight at home. Follow up as an outpatient: Scheduled for 04/28 with Dr. Carilyn Goodpasture  For questions or updates, please contact [place] Please consult [email or web address] for contact info under        Signed, Sanda Klein, MD  12/07/2019, 1:18 PM

## 2019-12-07 NOTE — Progress Notes (Signed)
Patient lethargic and weak tonight. Patient saturations 96% on 2L [place].

## 2019-12-07 NOTE — Discharge Summary (Addendum)
Physician Discharge Summary  [place]ia [place]:[id number] DOB: 10/25/1940 DOA: 12/04/2019  PCP: Pearson Grippe, MD  Admit date: 12/04/2019 Discharge date: 12/07/2019  Admitted From: Home Discharge disposition: Home   Code Status: Full Code  Diet Recommendation: Cardiac diet  Recommendations for Outpatient Follow-Up:   1. Follow-up with PCP as an outpatient 2. Follow-up with cardiology as an outpatient  Discharge Diagnosis:   Active Problems:   Community acquired pneumonia   Pneumonia  History of Present Illness / Brief narrative:  Virginia Gilbert a 79 y.o.femalewith medical history significant ofparoxysmal A. fib, hypertension, IDDM, mitral regurgitation who underwent ablation for chronic A. fib on 4/20.  Patient presented to the ED on 4/22 with complaint of worsening shortness of breath.  Her procedure on 4/20 lasted 2 hours and she felt fine at discharge. 4/21, patient started having shortness of breath associated with chills, no fever, no cough.  Shortness of breath got significant worse with wheezing and occasional cough and hence presented to the ED on 4/22.  In the ED, patient was afebrile, found to be tachypneic, hypoxic requiring 4 L oxygen by nasal cannula. Labs showed WBC count normal at 9.7, renal function normal. Chest x-ray showed bilateral airspace opacities, most confluent involving the upper lobes, indicating airspace pulmonary edema versus pneumonia. CT chest showed interval development of bilateral perihilar airspace disease, favor edema over infection. Initial troponin was elevated to 355, subsequently trended down to 310 and 143 Patient was admitted to hospitalist service for further evaluation and management.  Hospital Course:  Acute hypoxic respiratory failure Acute diastolic CHF exacerbation post A. fib ablation procedure -Chest x-ray showed bilateral airspace opacities.  CT chest favors edema over infection. -Initially patient was on empiric  antibiotics which I stopped on 4/24. -Was not checked for pulmonary embolism but patient reports compliance to Eliquis. -Cardiology consult appreciated. -Echocardiogram showed EF of 50 to 55%.  No wall motion abnormality. -Patient was started on diuresis with IV Lasix.  Continue Lasix 20 mg daily at home.  -Feels better.  Able to breathe better today.  To check for ambulatory oxygen level to see if he qualifies for home oxygen.  Lactic acidosis -Lactic acid level peaked at 2.4.  May be secondary to Metformin.  Not in sepsis.  Elevated troponin -355 >> 310>> 143 -No chest pain, no significant ST-T changes on EKG -normal stress test in Oct 2020, -suspect troponin rise related to ablation 2 days ago, echo did not show any drop in EF or wall motion abnormality. -cardiology consultation appreciated.  Paroxysmal A. Fib status post ablation Mitral regurgitation -In and out A. fib, overall rate controlled.  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  -never had a formal pulmonary function test -On admission, she was noted to have wheezing on admission and hence started on p.o. steroids and inhalers. -Outpatient pulmonology follow-up  Hypertension -Home meds include Coreg, HCTZ, losartan, amlodipine.   -Switch from HCTZ to Lasix.  Continue others.  Diabetes mellitus -A1c 6.9 on 4/22 -Hyperglycemia to more than 200 on admission. -Home meds include glimepiride, Metformin, Trulicity,. -Home meds were resumed.  This morning, patient's blood pressure dropped as low as 39.  Patient states that she has episodes of hypoglycemia at home as well and they are less frequent now after Evaristo Bury was held.   -With low blood sugar episode this morning, I would advise the patient to cut down on glimepiride from 4 mg twice daily  to daily and continue Trulicity and Metformin.   -Continue to monitor blood sugar at home.   Mobility: Encourage ambulation.  Independent prior to admission Code Status: Full code  Subjective:  Seen and examined this morning.  Patient elderly Caucasian female.  Sitting up in chair.  Had episode of hypoglycemia this morning.  Poor appetite in the hospital.  Discharge Exam:   Vitals:   12/06/19 2245 12/06/19 2332 12/07/19 0802 12/07/19 0832  BP:  (!) 110/52 (!) 133/56   Pulse: 78 80 (!) 57 61  Resp: 18 18 16 18   Temp:  98 F (36.7 C) 98.4 F (36.9 C)   TempSrc:  Oral    SpO2: 98% 97% 95% 96%  Weight:      Height:        Body mass index is 30.68 kg/m.  General exam: Appears calm and comfortable.  Skin: No rashes, lesions or ulcers. HEENT: Atraumatic, normocephalic, supple neck, no obvious bleeding Lungs: Clear to auscultate bilaterally CVS: Regular rate and rhythm, no murmur GI/Abd soft, nontender, nondistended, bowel sound present CNS: Alert, awake, oriented x3 Psychiatry: Mood appropriate Extremities: No pedal edema, no calf tenderness  Discharge Instructions:  Wound care: None Discharge Instructions    Diet - low sodium heart healthy   Complete by: As directed    Increase activity slowly   Complete by: As directed      Follow-up Information    Pearson GrippeKim, James, MD Follow up.   Specialty: Internal Medicine Contact information: [address] [place] [id number] [phone]          Allergies as of 12/07/2019      Reactions   Codeine Nausea And Vomiting   Penicillins Anaphylaxis   Did it involve swelling of the face/tongue/throat, SOB, or low BP? Yes Did it involve sudden or severe rash/hives, skin peeling, or any reaction on the inside of your mouth or nose? Unknown Did you need to seek medical attention at a hospital or doctor's office? Yes--inpatient when reaction occurred When did it last happen?1964 or 1965 If all above answers are "NO", may proceed with cephalosporin use.   Tramadol Itching      Medication List     STOP taking these medications   hydrochlorothiazide 12.5 MG tablet Commonly known as: HYDRODIURIL   Tresiba 100 UNIT/ML Soln Generic drug: Insulin Degludec     TAKE these medications   amLODipine 5 MG tablet Commonly known as: NORVASC Take 5 mg by mouth daily.   apixaban 5 MG Tabs tablet Commonly known as: Eliquis Take 1 tablet (5 mg total) by mouth 2 (two) times daily.   carvedilol 3.125 MG tablet Commonly known as: COREG Take 3.125 mg by mouth 2 (two) times daily.   CINNAMON PO Take 1,000 mg by mouth daily.   ezetimibe 10 MG tablet Commonly known as: ZETIA Take 10 mg by mouth daily.   Fish Oil 1000 MG Caps Take 1,000 mg by mouth daily.   furosemide 20 MG tablet Commonly known as: Lasix Take 1 tablet (20 mg total) by mouth daily.   glimepiride 4 MG tablet Commonly known as: AMARYL Take 1 tablet (4 mg total) by mouth daily with breakfast. What changed: when to take this   ibuprofen 200 MG tablet Commonly known as: ADVIL Take 400 mg by mouth every 6 (six) hours as needed for headache or moderate pain.   losartan 100 MG tablet Commonly known as: COZAAR Take 100 mg by mouth daily.   metFORMIN 1000  MG tablet Commonly known as: GLUCOPHAGE Take 1,000 mg by mouth 2 (two) times daily.   pantoprazole 40 MG tablet Commonly known as: Protonix Take 1 tablet (40 mg total) by mouth daily.   Trulicity 1.5 MG/0.5ML Sopn Generic drug: Dulaglutide Inject 1.5 mg into the skin every Friday.   Vitamin B-12 2500 MCG Subl Place 2,500 mcg under the tongue daily with lunch.   Vitamin D-3 125 MCG (5000 UT) Tabs Take 5,000 Units by mouth daily after lunch.       Time coordinating discharge: 35 minutes  The results of significant diagnostics from this hospitalization (including imaging, microbiology, ancillary and laboratory) are listed below for reference.    Procedures and Diagnostic Studies:   CT CHEST WO CONTRAST  Result Date: 12/04/2019 CLINICAL DATA:   Hypoxemia, recent cardiac ablation for atrial fibrillation EXAM: CT CHEST WITHOUT CONTRAST TECHNIQUE: Multidetector CT imaging of the chest was performed following the standard protocol without IV contrast. COMPARISON:  12/04/2019, 11/25/2019 FINDINGS: Cardiovascular: Unenhanced imaging of the heart and great vessels demonstrates no pericardial effusion. Stable calcification of the mitral and aortic valves. Mild atherosclerosis of the aorta and coronary vessels. Thoracic aorta is normal in caliber. Mediastinum/Nodes: Multiple subcentimeter mediastinal lymph nodes are seen, new since prior study and likely reactive. Thyroid, trachea, and esophagus are grossly unremarkable. Lungs/Pleura: Since the previous exam, bilateral perihilar airspace disease has developed, greatest in the upper lung zones. There is interlobular septal thickening. Trace bilateral pleural effusions. No pneumothorax. Central airways are patent. Upper Abdomen: No acute abnormality. 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 Musculoskeletal: No acute or destructive bony lesions. Reconstructed images demonstrate no additional findings. IMPRESSION: 1. Interval development of bilateral perihilar airspace disease the, favor edema over infection. 2.  Aortic Atherosclerosis (ICD10-I70.0). Electronically Signed   By: Sharlet Salina M.D.   On: 12/04/2019 19:14   DG CHEST PORT 1 VIEW  Result Date: 12/05/2019 CLINICAL DATA:  79 year old female with increasing shortness of breath, abnormal pulmonary auscultation. Status post Lasix x1. recent cardiac ablation. EXAM: PORTABLE CHEST 1 VIEW COMPARISON:  Chest CT and radiographs 12/04/2019 and earlier. FINDINGS: Portable AP semi upright view at 0633 hours. Continued low lung volumes. Stable cardiac size and mediastinal contours. Visualized tracheal air column is within normal limits. No change since yesterday in bilateral confluent pulmonary interstitial  opacity, corresponding to upper lobe predominant bilateral peribronchial mostly ground-glass opacity on CT. No pleural effusion is evident. No areas of worsening ventilation. Paucity of bowel gas. No acute osseous abnormality identified. IMPRESSION: Unchanged bilateral pulmonary opacity since yesterday. No pleural fluid is evident. Consider viral/atypical infection if the patient does not respond to diuresis. Electronically Signed   By: Odessa Fleming M.D.   On: 12/05/2019 08:28   DG Chest Port 1 View  Result Date: 12/04/2019 CLINICAL DATA:  79 year old who is postprocedure day 2 from a cardiac ablation for atrial fibrillation. She presents with acute onset of cough and shortness of breath that began yesterday. EXAM: PORTABLE CHEST 1 VIEW COMPARISON:  CT heart 11/25/2019 and chest x-ray 03/31/2019. FINDINGS: Cardiac silhouette upper normal in size to slightly enlarged. Airspace opacities throughout both lungs, most confluent in the upper lobes. Chronic elevation of the RIGHT hemidiaphragm. No visible pleural effusions. IMPRESSION: Bilateral airspace opacities, most confluent involving the upper lobes, indicating airspace pulmonary edema versus pneumonia. Electronically Signed   By: Hulan Saas M.D.   On: 12/04/2019 11:21   ECHOCARDIOGRAM COMPLETE  Result  Date: 12/05/2019    ECHOCARDIOGRAM REPORT   Patient Name:   Virginia Gilbert Date of Exam: 12/05/2019 Medical Rec #:  [id number]       Height:       59.0 in Accession #:    [id number]      Weight:       151.9 lb Date of Birth:  05-Mar-1941        BSA:          1.641 m Patient Age:    78 years        BP:           112/44 mmHg Patient Gender: F               HR:           65 bpm. Exam Location:  Inpatient Procedure: 2D Echo, Cardiac Doppler and Color Doppler Indications:    Dyspnea  History:        Patient has prior history of Echocardiogram examinations, most                 recent 10/08/2019. Mitral Valve Disease and MR, Arrythmias:Atrial                  Fibrillation, Signs/Symptoms:Shortness of Breath; Risk                 Factors:Diabetes and Hypertension.  Sonographer:    Lavenia Atlas Referring Phys: [id number] PING T ZHANG IMPRESSIONS  1. Patient appears to be in atrial flutter throughout exam.  2. Left ventricular ejection fraction, by estimation, is 60 to 65%. The left ventricle has normal function. The left ventricle has no regional wall motion abnormalities. Left ventricular diastolic parameters are indeterminate.  3. Right ventricular systolic function is normal. The right ventricular size is normal. There is mildly elevated pulmonary artery systolic pressure.  4. Left atrial size was moderately dilated.  5. The mitral valve is normal in structure. Mild mitral valve regurgitation. No evidence of mitral stenosis.  6. The aortic valve is tricuspid. Aortic valve regurgitation is trivial. Mild to moderate aortic valve sclerosis/calcification is present, without any evidence of aortic stenosis.  7. The inferior vena cava is normal in size with greater than 50% respiratory variability, suggesting right atrial pressure of 3 mmHg. FINDINGS  Left Ventricle: Left ventricular ejection fraction, by estimation, is 60 to 65%. The left ventricle has normal function. The left ventricle has no regional wall motion abnormalities. The left ventricular internal cavity size was normal in size. There is  no left ventricular hypertrophy. Left ventricular diastolic parameters are indeterminate. Right Ventricle: The right ventricular size is normal. No increase in right ventricular wall thickness. Right ventricular systolic function is normal. There is mildly elevated pulmonary artery systolic pressure. The tricuspid regurgitant velocity is 2.82  m/s, and with an assumed right atrial pressure of 3 mmHg, the estimated right ventricular systolic pressure is 34.8 mmHg. Left Atrium: Left atrial size was moderately dilated. Right Atrium: Right atrial size was normal in size.  Pericardium: There is no evidence of pericardial effusion. Mitral Valve: The mitral valve is normal in structure. There is mild thickening of the mitral valve leaflet(s). There is mild calcification of the mitral valve leaflet(s). Normal mobility of the mitral valve leaflets. Moderate mitral annular calcification. Mild mitral valve regurgitation. No evidence of mitral valve stenosis. Tricuspid Valve: The tricuspid valve is normal in structure. Tricuspid valve regurgitation is mild . No evidence of tricuspid stenosis. Aortic Valve: The  aortic valve is tricuspid. Aortic valve regurgitation is trivial. Mild to moderate aortic valve sclerosis/calcification is present, without any evidence of aortic stenosis. Pulmonic Valve: The pulmonic valve was normal in structure. Pulmonic valve regurgitation is not visualized. No evidence of pulmonic stenosis. Aorta: The aortic root is normal in size and structure. Venous: The inferior vena cava is normal in size with greater than 50% respiratory variability, suggesting right atrial pressure of 3 mmHg. IAS/Shunts: No atrial level shunt detected by color flow Doppler. Additional Comments: Patient appears to be in atrial flutter throughout exam.  LEFT VENTRICLE PLAX 2D LVIDd:         4.60 cm  Diastology LVIDs:         2.40 cm  LV e' lateral:   8.27 cm/s LV PW:         1.00 cm  LV E/e' lateral: 11.8 LV IVS:        1.00 cm  LV e' medial:    7.62 cm/s LVOT diam:     1.70 cm  LV E/e' medial:  12.8 LV SV:         54 LV SV Index:   33 LVOT Area:     2.27 cm  RIGHT VENTRICLE RV Basal diam:  2.80 cm RV S prime:     12.90 cm/s TAPSE (M-mode): 3.1 cm LEFT ATRIUM           Index       RIGHT ATRIUM           Index LA diam:      4.10 cm 2.50 cm/m  RA Area:     14.20 cm LA Vol (A4C): 91.9 ml 56.01 ml/m RA Volume:   32.50 ml  19.81 ml/m  AORTIC VALVE LVOT Vmax:   122.00 cm/s LVOT Vmean:  73.900 cm/s LVOT VTI:    0.239 m  AORTA Ao Root diam: 2.40 cm MITRAL VALVE               TRICUSPID VALVE MV  Area (PHT): 3.27 cm    TR Peak grad:   31.8 mmHg MV Decel Time: 232 msec    TR Vmax:        282.00 cm/s MV E velocity: 97.40 cm/s MV A velocity: 39.90 cm/s  SHUNTS MV E/A ratio:  2.44        Systemic VTI:  0.24 m                            Systemic Diam: 1.70 cm Jenkins Rouge MD Electronically signed by Jenkins Rouge MD Signature Date/Time: 12/05/2019/11:13:09 AM    Final      Labs:   Basic Metabolic Panel: Recent Labs  Lab 12/04/19 1041 12/04/19 1041 12/04/19 2329 12/04/19 2329 12/05/19 0314 12/06/19 0539  NA 134*  --  137  --  137 138  K 4.2   < > 3.5   < > 3.3* 3.5  CL 100  --  103  --  104 101  CO2 22  --  23  --  25 29  GLUCOSE 229*  --  255*  --  235* 252*  BUN 29*  --  19  --  19 18  CREATININE 1.04*  --  0.83  --  0.79 0.81  CALCIUM 8.2*  --  8.2*  --  8.2* 8.3*   < > = values in this interval not displayed.   GFR Estimated Creatinine Clearance: 49.9  mL/min (by C-G formula based on SCr of 0.81 mg/dL). Liver Function Tests: Recent Labs  Lab 12/04/19 1041  AST 39  ALT 41  ALKPHOS 47  BILITOT 1.0  PROT 5.5*  ALBUMIN 3.1*   No results for input(s): LIPASE, AMYLASE in the last 168 hours. No results for input(s): AMMONIA in the last 168 hours. Coagulation profile No results for input(s): INR, PROTIME in the last 168 hours.  CBC: Recent Labs  Lab 12/04/19 1041 12/05/19 0314  WBC 15.1* 9.7  HGB 10.0* 9.6*  HCT 34.3* 31.5*  MCV 96.3 92.6  PLT 141* 138*   Cardiac Enzymes: No results for input(s): CKTOTAL, CKMB, CKMBINDEX, TROPONINI in the last 168 hours. BNP: Invalid input(s): POCBNP CBG: Recent Labs  Lab 12/07/19 0803 12/07/19 0820 12/07/19 0853 12/07/19 0927 12/07/19 1146  GLUCAP 39* 49* 68* 133* 221*   D-Dimer No results for input(s): DDIMER in the last 72 hours. Hgb A1c Recent Labs    12/04/19 1856  HGBA1C 6.9*   Lipid Profile No results for input(s): CHOL, HDL, LDLCALC, TRIG, CHOLHDL, LDLDIRECT in the last 72 hours. Thyroid function  studies No results for input(s): TSH, T4TOTAL, T3FREE, THYROIDAB in the last 72 hours.  Invalid input(s): FREET3 Anemia work up No results for input(s): VITAMINB12, FOLATE, FERRITIN, TIBC, IRON, RETICCTPCT in the last 72 hours. Microbiology Recent Results (from the past 240 hour(s))  SARS CORONAVIRUS 2 (TAT 6-24 HRS) Nasopharyngeal Nasopharyngeal Swab     Status: None   Collection Time: 11/29/19  1:35 PM   Specimen: Nasopharyngeal Swab  Result Value Ref Range Status   SARS Coronavirus 2 NEGATIVE NEGATIVE Final    Comment: (NOTE) SARS-CoV-2 target nucleic acids are NOT DETECTED. The SARS-CoV-2 RNA is generally detectable in upper and lower respiratory specimens during the acute phase of infection. Negative results do not preclude SARS-CoV-2 infection, do not rule out co-infections with other pathogens, and should not be used as the sole basis for treatment or other patient management decisions. Negative results must be combined with clinical observations, patient history, and epidemiological information. The expected result is Negative. Fact Sheet for Patients: HairSlick.no Fact Sheet for Healthcare Providers: quierodirigir.com This test is not yet approved or cleared by the [place] FDA and  has been authorized for detection and/or diagnosis of SARS-CoV-2 by FDA under an Emergency Use Authorization (EUA). This EUA will remain  in effect (meaning this test can be used) for the duration of the COVID-19 declaration under Section 56 4(b)(1) of the Act, 21 U.S.C. section 360bbb-3(b)(1), unless the authorization is terminated or revoked sooner. Performed at [facility], [address], [place], [place] [address]   Blood Culture (routine x 2)     Status: None (Preliminary result)   Collection Time: 12/04/19 11:45 AM   Specimen: BLOOD RIGHT ARM  Result Value Ref Range Status   Specimen Description BLOOD RIGHT ARM  Final    Special Requests   Final    BOTTLES DRAWN AEROBIC AND ANAEROBIC Blood Culture results may not be optimal due to an inadequate volume of blood received in culture bottles   Culture   Final    NO GROWTH 2 DAYS Performed at [facility], [address] [place], [place] [address]    Report Status PENDING  Incomplete  Blood Culture (routine x 2)     Status: None (Preliminary result)   Collection Time: 12/04/19 11:58 AM   Specimen: BLOOD RIGHT HAND  Result Value Ref Range Status  Specimen Description BLOOD RIGHT HAND  Final   Special Requests   Final    BOTTLES DRAWN AEROBIC AND ANAEROBIC Blood Culture results may not be optimal due to an inadequate volume of blood received in culture bottles   Culture   Final    NO GROWTH 2 DAYS Performed at [facility], [address], [place], [place] [address]    Report Status PENDING  Incomplete  Respiratory Panel by RT PCR (Flu A&B, Covid) - Nasopharyngeal Swab     Status: None   Collection Time: 12/04/19  3:45 PM   Specimen: Nasopharyngeal Swab  Result Value Ref Range Status   SARS Coronavirus 2 by RT PCR NEGATIVE NEGATIVE Final    Comment: (NOTE) SARS-CoV-2 target nucleic acids are NOT DETECTED. The SARS-CoV-2 RNA is generally detectable in upper respiratoy specimens during the acute phase of infection. The lowest concentration of SARS-CoV-2 viral copies this assay can detect is 131 copies/mL. A negative result does not preclude SARS-Cov-2 infection and should not be used as the sole basis for treatment or other patient management decisions. A negative result may occur with  improper specimen collection/handling, submission of specimen other than nasopharyngeal swab, presence of viral mutation(s) within the areas targeted by this assay, and inadequate number of viral copies (<131 copies/mL). A negative result must be combined with clinical observations, patient history, and epidemiological information. The expected result is  Negative. Fact Sheet for Patients:  https://www.moore.com/ Fact Sheet for Healthcare Providers:  https://www.young.biz/ This test is not yet ap proved or cleared by the [place] FDA and  has been authorized for detection and/or diagnosis of SARS-CoV-2 by FDA under an Emergency Use Authorization (EUA). This EUA will remain  in effect (meaning this test can be used) for the duration of the COVID-19 declaration under Section 564(b)(1) of the Act, 21 U.S.C. section 360bbb-3(b)(1), unless the authorization is terminated or revoked sooner.    Influenza A by PCR NEGATIVE NEGATIVE Final   Influenza B by PCR NEGATIVE NEGATIVE Final    Comment: (NOTE) The Xpert Xpress SARS-CoV-2/FLU/RSV assay is intended as an aid in  the diagnosis of influenza from Nasopharyngeal swab specimens and  should not be used as a sole basis for treatment. Nasal washings and  aspirates are unacceptable for Xpert Xpress SARS-CoV-2/FLU/RSV  testing. Fact Sheet for Patients: https://www.moore.com/ Fact Sheet for Healthcare Providers: https://www.young.biz/ This test is not yet approved or cleared by the [place] FDA and  has been authorized for detection and/or diagnosis of SARS-CoV-2 by  FDA under an Emergency Use Authorization (EUA). This EUA will remain  in effect (meaning this test can be used) for the duration of the  Covid-19 declaration under Section 564(b)(1) of the Act, 21  U.S.C. section 360bbb-3(b)(1), unless the authorization is  terminated or revoked. Performed at [facility], [address], [place], [place] [address]     Please note: You were cared for by a hospitalist during your hospital stay. Once you are discharged, your primary care physician will handle any further medical issues. Please note that NO REFILLS for any discharge medications will be authorized once you are discharged, as it is imperative  that you return to your primary care physician (or establish a relationship with a primary care physician if you do not have one) for your post hospital discharge needs so that they can reassess your need for medications and monitor your lab values.  Signed: Melina Schools Carrington Olazabal  [facility] 12/07/2019, 1:20 PM

## 2019-12-07 NOTE — Progress Notes (Addendum)
SATURATION QUALIFICATIONS: (This note is used to comply with regulatory documentation for home oxygen)  Patient Saturations on Room Air at Rest = 96%  Patient Saturations on Room Air while Ambulating = 88%  Patient Saturations on 2 Liters of oxygen while Ambulating = 94%  Please briefly explain why patient needs home oxygen: Multifocal PNA

## 2019-12-09 ENCOUNTER — Telehealth (HOSPITAL_COMMUNITY): Payer: Self-pay | Admitting: *Deleted

## 2019-12-09 LAB — CULTURE, BLOOD (ROUTINE X 2)
Culture: NO GROWTH
Culture: NO GROWTH

## 2019-12-09 NOTE — Progress Notes (Signed)
Cardiology Office Note:    Date:  12/10/2019   ID:  DUNG PRIEN, DOB 1941-07-07, MRN [id number]  PCP:  Jani Gravel, MD  Cardiologist:  No primary care provider on file.  Electrophysiologist:  None   Referring MD: Jani Gravel, MD   Chief Complaint: f/u afib ablation and hospital admission for diastolic HF.   History of Present Illness:    Virginia Gilbert is a 79 y.o. female with a history of hypertension, persistent atrial fibrillation status post atrial fibrillation ablation on 12/02/2019, diabetes mellitus.  She recently had a hospitalization for acute diastolic dysfunction with multifactorial respiratory failure from heart failure exacerbation as well as hypoxic respiratory failure.  She developed worsening shortness of breath at home and presented to the hospital on 12/05/2019.  Feels much better. Does not want to wear the oxygen that she was dismised on and feels her sats are much better.   Felt better after the ablation.   Still in atypical atrial flutter.   No swelling, no SOB.   No longer feels like her "heart is running away.".  Taking as needed lasix 20 mg. Only took once since ablation. Prefers to only take it as needed. Weight 155 lb dry weight and stable. Volume overloaded today.   Past Medical History:  Diagnosis Date  . Diabetes mellitus without complication ([place])   . Hyperlipidemia   . Hypertension   . Mitral regurgitation    evaluated by TEE 09/2019  . Persistent atrial fibrillation [facility])     Past Surgical History:  Procedure Laterality Date  . ATRIAL FIBRILLATION ABLATION N/A 12/02/2019   Procedure: ATRIAL FIBRILLATION ABLATION;  Surgeon: Thompson Grayer, MD;  Location: [place] CV LAB;  Service: Cardiovascular;  Laterality: N/A;  . BUBBLE STUDY  10/08/2019   Procedure: BUBBLE STUDY;  Surgeon: Elouise Munroe, MD;  Location: [facility] ENDOSCOPY;  Service: Cardiology;;  . CARDIOVERSION N/A 07/08/2019   Procedure: CARDIOVERSION;  Surgeon: Adrian Prows, MD;   Location: [facility] ENDOSCOPY;  Service: Cardiovascular;  Laterality: N/A;  . CARDIOVERSION N/A 09/02/2019   Procedure: CARDIOVERSION;  Surgeon: Adrian Prows, MD;  Location: [place];  Service: Cardiovascular;  Laterality: N/A;  . PARTIAL HYSTERECTOMY     1977  . TEE WITHOUT CARDIOVERSION N/A 10/08/2019   Procedure: TRANSESOPHAGEAL ECHOCARDIOGRAM (TEE);  Surgeon: Elouise Munroe, MD;  Location: [place];  Service: Cardiology;  Laterality: N/A;    Current Medications: Current Meds  Medication Sig  . apixaban (ELIQUIS) 5 MG TABS tablet Take 1 tablet (5 mg total) by mouth 2 (two) times daily.  . carvedilol (COREG) 3.125 MG tablet Take 3.125 mg by mouth 2 (two) times daily.   . Cholecalciferol (VITAMIN D-3) 125 MCG (5000 UT) TABS Take 5,000 Units by mouth daily after lunch.  Marland Kitchen CINNAMON PO Take 1,000 mg by mouth daily.  . furosemide (LASIX) 20 MG tablet Take 1 tablet (20 mg total) by mouth daily.  Marland Kitchen glimepiride (AMARYL) 4 MG tablet Take 1 tablet (4 mg total) by mouth daily with breakfast.  . ibuprofen (ADVIL) 200 MG tablet Take 400 mg by mouth every 6 (six) hours as needed for headache or moderate pain.  Marland Kitchen losartan (COZAAR) 100 MG tablet Take 100 mg by mouth daily.   . metFORMIN (GLUCOPHAGE) 1000 MG tablet Take 1,000 mg by mouth 2 (two) times daily.   . pantoprazole (PROTONIX) 40 MG tablet Take 1 tablet (40 mg total) by mouth daily.  . TRULICITY 1.5 ZG/0.1VC SOPN Inject 1.5 mg into the skin  every Friday.      Allergies:   Codeine, Penicillins, and Tramadol   Social History   Socioeconomic History  . Marital status: Widowed    Spouse name: Not on file  . Number of children: 3  . Years of education: Not on file  . Highest education level: Not on file  Occupational History  . Not on file  Tobacco Use  . Smoking status: Former Smoker    Packs/day: 0.50    Years: 15.00    Pack years: 7.50    Types: Cigarettes    Quit date: 05/14/1985    Years since quitting: 34.5  . Smokeless  tobacco: Never Used  Substance and Sexual Activity  . Alcohol use: No    Alcohol/week: 0.0 standard drinks  . Drug use: Never  . Sexual activity: Not on file  Other Topics Concern  . Not on file  Social History Narrative  . Not on file   Social Determinants of Health   Financial Resource Strain:   . Difficulty of Paying Living Expenses:   Food Insecurity:   . Worried About Programme researcher, broadcasting/film/video in the Last Year:   . Barista in the Last Year:   Transportation Needs:   . Freight forwarder (Medical):   Marland Kitchen Lack of Transportation (Non-Medical):   Physical Activity:   . Days of Exercise per Week:   . Minutes of Exercise per Session:   Stress:   . Feeling of Stress :   Social Connections:   . Frequency of Communication with Friends and Family:   . Frequency of Social Gatherings with Friends and Family:   . Attends Religious Services:   . Active Member of Clubs or Organizations:   . Attends Banker Meetings:   Marland Kitchen Marital Status:      Family History: The patient's family history includes Diabetes in her brother and mother; Emphysema in her brother and father; Heart disease in her brother, brother, and mother.  ROS:   Please see the history of present illness.    All other systems reviewed and are negative.  EKGs/Labs/Other Studies Reviewed:    The following studies were reviewed today:  EKG:  Atrial flutter, atypical with variable block, rate 71 bpm  Recent Labs: 12/04/2019: ALT 41; B Natriuretic Peptide 217.9 12/05/2019: Hemoglobin 9.6; Platelets 138 12/06/2019: BUN 18; Creatinine, Ser 0.81; Potassium 3.5; Sodium 138  Recent Lipid Panel No results found for: CHOL, TRIG, HDL, CHOLHDL, VLDL, LDLCALC, LDLDIRECT  Physical Exam:    VS:  BP (!) 142/74   Pulse 71   Ht 4\' 11"  (1.499 m)   Wt 155 lb 12.8 oz (70.7 kg)   SpO2 93%   BMI 31.47 kg/m     Wt Readings from Last 5 Encounters:  12/10/19 155 lb 12.8 oz (70.7 kg)  12/04/19 151 lb 14.4 oz (68.9  kg)  12/02/19 152 lb (68.9 kg)  10/22/19 157 lb (71.2 kg)  10/21/19 157 lb 12.8 oz (71.6 kg)     Constitutional: No acute distress Eyes: sclera non-icteric, normal conjunctiva and lids ENMT: normal dentition, moist mucous membranes Cardiovascular: regular rhythm, normal rate, no murmurs. S1 and S2 normal. Radial pulses normal bilaterally. jugular venous distention to the lower 1/3 of the neck sitting upright.12/21/19  Respiratory: clear to auscultation bilaterally GI : normal bowel sounds, soft and nontender. No distention.   MSK: extremities warm, well perfused. 1+ edema to the knee.  NEURO: grossly nonfocal exam, moves all extremities. PSYCH:  alert and oriented x 3, normal mood and affect.   ASSESSMENT:    1. Persistent atrial fibrillation (HCC)   2. Mild mitral regurgitation   3. Secondary hypercoagulable state (HCC)   4. Shortness of breath   5. Mixed hyperlipidemia   6. Acute diastolic heart failure (HCC)   7. Atypical atrial flutter (HCC)   8. Essential hypertension    PLAN:    Persistent atrial fibrillation (HCC) status post atrial fibrillation ablation 12/02/2019 Atypical atrial flutter (HCC)  -she is now in an atypical atrial flutter, this has been reviewed by electrophysiology while in hospital recently.  She is rate controlled, continue to observe.  She says she feels much better out of atrial fibrillation.  Mild mitral regurgitation-recent TEE, can be followed by echo as needed.  Secondary hypercoagulable state (HCC)-continue Eliquis, no bleeding events.  Shortness of breath Acute diastolic heart failure (HCC)-recent hospitalization shortly after A. fib ablation for diastolic heart failure decompensation.  Was to be dismissed on Lasix 40 mg daily with potassium supplementation, patient misunderstood and is only taking Lasix 20 mg as needed for swelling or shortness of breath, or weight gain.  Her weights have been stable, however she has 1+ pitting edema in her lower  extremities and JVP is elevated to the lower one third of the neck while sitting upright.  This suggests continued volume overload, I would like for her to take Lasix 40 mg daily and potassium supplementation 20 mEq daily for the next 7 to 10 days, and she will follow-up with the APP in our clinic.  She feels well with no shortness of breath.  Essential hypertension-mildly elevated blood pressure today, will increase dose of Lasix for heart failure symptoms and continue to observe.  Mixed hyperlipidemia-not on lipid-lowering therapy, review at next visit.  DM2 -she needs to establish with a primary care doctor which she does not currently have to help manage her diabetes going forward.  Do the following things EVERY DAY:  1) Weigh yourself EVERY morning after you go to the bathroom but before you eat or drink anything. Write this number down in a weight log/diary. If you gain 3 pounds overnight or 5 pounds in a week, call the office.  2) Take your medicines as prescribed. If you have concerns about your medications, please call us before you stop taking them.   3) Eat low salt foods--Limit salt (sodium) to 2000 mg per day. This will help prevent your body from holding onto fluid. Read food labels as many processed foods have a lot of sodium, especially canned goods and prepackaged meats. If you would like some assistance choosing low sodium foods, we would be happy to set you up with a nutritionist.  4) Stay as active as you can everyday. Staying active will give you more energy and make your muscles stronger. Start with 5 minutes at a time and work your way up to 30 minutes a day. Break up your activities--do some in the morning and some in the afternoon. Start with 3 days per week and work your way up to 5 days as you can.  If you have chest pain, feel short of breath, dizzy, or lightheaded, STOP. If you don't feel better after a short rest, call 911. If you do feel better, call the office to let  us know you have symptoms with exercise.  5) Limit all fluids for the day to less than 2 liters. Fluid includes all drinks, coffee, juice, ice chips, soup, jello,  and all other liquids.   Total time of encounter: 40 minutes total time of encounter, including 25 minutes spent in face-to-face patient care on the date of this encounter. This time includes coordination of care and counseling regarding above mentioned problem list. Remainder of non-face-to-face time involved reviewing chart documents/testing relevant to the patient encounter and documentation in the medical record. I have independently reviewed documentation from referring provider.   Weston Brass, MD [place]  [facility]    Medication Adjustments/Labs and Tests Ordered: Current medicines are reviewed at length with the patient today.  Concerns regarding medicines are outlined above.  Orders Placed This Encounter  Procedures  . EKG 12-Lead   Meds ordered this encounter  Medications  . potassium chloride SA (KLOR-CON) 20 MEQ tablet    Sig: Take 1 tablet (20 mEq total) by mouth daily.    Dispense:  30 tablet    Refill:  6    Patient Instructions  Medication Instructions:   Your physician has recommended you make the following change in your medication:   1) Increase Lasix to 40 mg (2 tablets) by mouth once a day 2) Start Potassium 20 mEq, 1 tablet by mouth once a day  *If you need a refill on your cardiac medications before your next appointment, please call your pharmacy*  Lab Work:  Your physician recommends that you return for lab work in 7-10 days for BMET and Magnesium  If you have labs (blood work) drawn today and your tests are completely normal, you will receive your results only by: Marland Kitchen MyChart Message (if you have MyChart) OR . A paper copy in the mail If you have any lab test that is abnormal or we need to change your treatment, we will call you to review the  results.  Testing/Procedures:  None ordered today   Follow-Up: At [facility], you and your health needs are our priority.  As part of our continuing mission to provide you with exceptional heart care, we have created designated Provider Care Teams.  These Care Teams include your primary Cardiologist (physician) and Advanced Practice Providers (APPs -  Physician Assistants and Nurse Practitioners) who all work together to provide you with the care you need, when you need it.  We recommend signing up for the patient portal called "MyChart".  Sign up information is provided on this After Visit Summary.  MyChart is used to connect with patients for Virtual Visits (Telemedicine).  Patients are able to view lab/test results, encounter notes, upcoming appointments, etc.  Non-urgent messages can be sent to your provider as well.   To learn more about what you can do with MyChart, go to [email or web address].    Your next appointment:   7 day(s)  The format for your next appointment:   In Person  Provider:   You may see:    Theodore Demark, PA-C  Joni Reining, DNP, ANP  Cadence Fransico Michael, NP  Follow up with Dr. Jacques Navy in 1 month

## 2019-12-09 NOTE — Telephone Encounter (Signed)
-----   Message from Hillis Range, MD sent at 12/05/2019 10:32 PM EDT ----- Please schedule post hospital follow-up this coming week. She had chf post ablation and was hospitalized.

## 2019-12-09 NOTE — Telephone Encounter (Signed)
Called to talk with patient - states she has follow up with Dr. Jacques Navy tomorrow morning and if will call us back if she feels needs to be seen before 5/18 as scheduled. Pt feels better since discharge just very weak.

## 2019-12-10 ENCOUNTER — Ambulatory Visit: Payer: Medicare Other | Admitting: Internal Medicine

## 2019-12-10 ENCOUNTER — Other Ambulatory Visit: Payer: Self-pay

## 2019-12-10 ENCOUNTER — Encounter: Payer: Self-pay | Admitting: Internal Medicine

## 2019-12-10 VITALS — BP 142/74 | HR 71 | Ht 59.0 in | Wt 155.8 lb

## 2019-12-10 DIAGNOSIS — R0602 Shortness of breath: Secondary | ICD-10-CM

## 2019-12-10 DIAGNOSIS — E782 Mixed hyperlipidemia: Secondary | ICD-10-CM | POA: Diagnosis not present

## 2019-12-10 DIAGNOSIS — I4819 Other persistent atrial fibrillation: Secondary | ICD-10-CM | POA: Diagnosis not present

## 2019-12-10 DIAGNOSIS — D6869 Other thrombophilia: Secondary | ICD-10-CM

## 2019-12-10 DIAGNOSIS — I5031 Acute diastolic (congestive) heart failure: Secondary | ICD-10-CM

## 2019-12-10 DIAGNOSIS — I484 Atypical atrial flutter: Secondary | ICD-10-CM

## 2019-12-10 DIAGNOSIS — I10 Essential (primary) hypertension: Secondary | ICD-10-CM

## 2019-12-10 DIAGNOSIS — I340 Nonrheumatic mitral (valve) insufficiency: Secondary | ICD-10-CM

## 2019-12-10 MED ORDER — POTASSIUM CHLORIDE CRYS ER 20 MEQ PO TBCR: 20.0000 meq | EXTENDED_RELEASE_TABLET | Freq: Every day | ORAL | 6 refills | Status: DC

## 2019-12-10 NOTE — Patient Instructions (Addendum)
Medication Instructions:   Your physician has recommended you make the following change in your medication:   1) Increase Lasix to 40 mg (2 tablets) by mouth once a day 2) Start Potassium 20 mEq, 1 tablet by mouth once a day  *If you need a refill on your cardiac medications before your next appointment, please call your pharmacy*  Lab Work:  Your physician recommends that you return for lab work in 7-10 days for BMET and Magnesium  If you have labs (blood work) drawn today and your tests are completely normal, you will receive your results only by: Marland Kitchen MyChart Message (if you have MyChart) OR . A paper copy in the mail If you have any lab test that is abnormal or we need to change your treatment, we will call you to review the results.  Testing/Procedures:  None ordered today   Follow-Up: At [facility], you and your health needs are our priority.  As part of our continuing mission to provide you with exceptional heart care, we have created designated Provider Care Teams.  These Care Teams include your primary Cardiologist (physician) and Advanced Practice Providers (APPs -  Physician Assistants and Nurse Practitioners) who all work together to provide you with the care you need, when you need it.  We recommend signing up for the patient portal called "MyChart".  Sign up information is provided on this After Visit Summary.  MyChart is used to connect with patients for Virtual Visits (Telemedicine).  Patients are able to view lab/test results, encounter notes, upcoming appointments, etc.  Non-urgent messages can be sent to your provider as well.   To learn more about what you can do with MyChart, go to [email or web address].    Your next appointment:   7 day(s)  The format for your next appointment:   In Person  Provider:   You may see:    Theodore Demark, PA-C  Joni Reining, DNP, ANP  Cadence Fransico Michael, NP  Follow up with Dr. Jacques Navy in 1 month

## 2019-12-15 DIAGNOSIS — Z780 Asymptomatic menopausal state: Secondary | ICD-10-CM | POA: Diagnosis not present

## 2019-12-15 DIAGNOSIS — E118 Type 2 diabetes mellitus with unspecified complications: Secondary | ICD-10-CM | POA: Diagnosis not present

## 2019-12-15 DIAGNOSIS — G252 Other specified forms of tremor: Secondary | ICD-10-CM | POA: Diagnosis not present

## 2019-12-15 DIAGNOSIS — K219 Gastro-esophageal reflux disease without esophagitis: Secondary | ICD-10-CM | POA: Diagnosis not present

## 2019-12-15 DIAGNOSIS — I10 Essential (primary) hypertension: Secondary | ICD-10-CM | POA: Diagnosis not present

## 2019-12-15 NOTE — Progress Notes (Signed)
Cardiology Office Note   Date:  12/15/2019   ID:  Virginia Gilbert, Virginia Gilbert 03-Aug-1941, MRN [id number]  PCP:  Pearson Grippe, MD  Cardiologist:  Dr.Acharya  No chief complaint on file.    History of Present Illness: Virginia Gilbert is a 79 y.o. female who presents for ongoing assessment and management of HTN, persistent atrial fib,s/p afib ablation on 12/02/2019. Other history includes Type II diabetes. She was recently admitted for acute diastolic CHF with multifactorial respiratory failure and hypoxia 11/2019.   On last office visit with Dr.Acharya,on 12/10/2019, she was found to be in atypical atrial flutter. She was rate controlled. She had been seen by EP during hospitalization in April.She was also found to have mild MR. She was not taking lasix as directed due to misunderstanding on dosing. She was advised to take lasix 40 mg daily as she was found to have evidence of fluid overload. Weight was 155 lbs. She was to continue with potassium supplementation of 20 mEq daily. Virginia Gilbert comes today feeling well.  She denies any further complaints of lower extremity edema, she denies any dyspnea on exertion.  She is not very active.  She states she is gracious of daily and her weight is unchanged.  She still gets good response from Lasix that she takes in the morning not so much in the afternoon.  Past Medical History:  Diagnosis Date  . Diabetes mellitus without complication (HCC)   . Hyperlipidemia   . Hypertension   . Mitral regurgitation    evaluated by TEE 09/2019  . Persistent atrial fibrillation [facility])     Past Surgical History:  Procedure Laterality Date  . ATRIAL FIBRILLATION ABLATION N/A 12/02/2019   Procedure: ATRIAL FIBRILLATION ABLATION;  Surgeon: Hillis Range, MD;  Location: [facility] INVASIVE CV LAB;  Service: Cardiovascular;  Laterality: N/A;  . BUBBLE STUDY  10/08/2019   Procedure: BUBBLE STUDY;  Surgeon: [name], MD;  Location: [facility] ENDOSCOPY;  Service: Cardiology;;  . CARDIOVERSION  N/A 07/08/2019   Procedure: CARDIOVERSION;  Surgeon: Yates Decamp, MD;  Location: [facility] ENDOSCOPY;  Service: Cardiovascular;  Laterality: N/A;  . CARDIOVERSION N/A 09/02/2019   Procedure: CARDIOVERSION;  Surgeon: Yates Decamp, MD;  Location: [facility] ENDOSCOPY;  Service: Cardiovascular;  Laterality: N/A;  . PARTIAL HYSTERECTOMY     1977  . TEE WITHOUT CARDIOVERSION N/A 10/08/2019   Procedure: TRANSESOPHAGEAL ECHOCARDIOGRAM (TEE);  Surgeon: [name], MD;  Location: [facility] ENDOSCOPY;  Service: Cardiology;  Laterality: N/A;     Current Outpatient Medications  Medication Sig Dispense Refill  . apixaban (ELIQUIS) 5 MG TABS tablet Take 1 tablet (5 mg total) by mouth 2 (two) times daily. 180 tablet 1  . carvedilol (COREG) 3.125 MG tablet Take 3.125 mg by mouth 2 (two) times daily.   12  . Cholecalciferol (VITAMIN D-3) 125 MCG (5000 UT) TABS Take 5,000 Units by mouth daily after lunch.    Marland Kitchen CINNAMON PO Take 1,000 mg by mouth daily.    . furosemide (LASIX) 20 MG tablet Take 1 tablet (20 mg total) by mouth daily. 30 tablet 0  . glimepiride (AMARYL) 4 MG tablet Take 1 tablet (4 mg total) by mouth daily with breakfast.  1  . ibuprofen (ADVIL) 200 MG tablet Take 400 mg by mouth every 6 (six) hours as needed for headache or moderate pain.    Marland Kitchen losartan (COZAAR) 100 MG tablet Take 100 mg by mouth daily.   6  . metFORMIN (GLUCOPHAGE) 1000 MG tablet Take 1,000  mg by mouth 2 (two) times daily.   6  . pantoprazole (PROTONIX) 40 MG tablet Take 1 tablet (40 mg total) by mouth daily. 45 tablet 0  . potassium chloride SA (KLOR-CON) 20 MEQ tablet Take 1 tablet (20 mEq total) by mouth daily. 30 tablet 6  . TRULICITY 1.5 MG/0.5ML SOPN Inject 1.5 mg into the skin every Friday.      No current facility-administered medications for this visit.    Allergies:   Codeine, Penicillins, and Tramadol    Social History:  The patient  reports that she quit smoking about 34 years ago. Her smoking use included cigarettes. She has a  7.50 pack-year smoking history. She has never used smokeless tobacco. She reports that she does not drink alcohol or use drugs.   Family History:  The patient's family history includes Diabetes in her brother and mother; Emphysema in her brother and father; Heart disease in her brother, brother, and mother.    ROS: All other systems are reviewed and negative. Unless otherwise mentioned in H&P    PHYSICAL EXAM: VS:  There were no vitals taken for this visit. , BMI There is no height or weight on file to calculate BMI. GEN: Well nourished, well developed, in no acute distress HEENT: normal Neck: no JVD,left  carotid bruit, or masses Cardiac: RRR; no murmurs, rubs, or gallops,no edema  Respiratory:  Clear to auscultation bilaterally, normal work of breathing GI: soft, nontender, nondistended, + BS MS: no deformity or atrophy Skin: warm and dry, no rash Neuro:  Strength and sensation are intact Psych: euthymic mood, full affect   EKG: Not completed this office visit  Recent Labs: 12/04/2019: ALT 41; B Natriuretic Peptide 217.9 12/26/19: Hemoglobin 9.6; Platelets 138 12/06/2019: BUN 18; Creatinine, Ser 0.81; Potassium 3.5; Sodium 138    Lipid Panel No results found for: CHOL, TRIG, HDL, CHOLHDL, VLDL, LDLCALC, LDLDIRECT    Wt Readings from Last 3 Encounters:  12/10/19 155 lb 12.8 oz (70.7 kg)  12/04/19 151 lb 14.4 oz (68.9 kg)  12/02/19 152 lb (68.9 kg)      Other studies Reviewed: Echocardiogram 26-Dec-2019.  Marland Kitchen Patient appears to be in atrial flutter throughout exam.  2. Left ventricular ejection fraction, by estimation, is 60 to 65%. The  left ventricle has normal function. The left ventricle has no regional  wall motion abnormalities. Left ventricular diastolic parameters are  indeterminate.  3. Right ventricular systolic function is normal. The right ventricular  size is normal. There is mildly elevated pulmonary artery systolic  pressure.  4. Left atrial size was  moderately dilated.  5. The mitral valve is normal in structure. Mild mitral valve  regurgitation. No evidence of mitral stenosis.  6. The aortic valve is tricuspid. Aortic valve regurgitation is trivial.  Mild to moderate aortic valve sclerosis/calcification is present, without  any evidence of aortic stenosis.  7. The inferior vena cava is normal in size with greater than 50%  respiratory variability, suggesting right atrial pressure of 3 mmHg.   PROCEDURES AFib Ablation 12/02/2019 1. Comprehensive electrophysiologic study. 2. Coronary sinus pacing and recording. 3. Three-dimensional mapping of atrial fibrillation (with additional mapping and ablation within the left atrium due to persistence of afib) 4. Ablation of atrial fibrillation (with additional mapping and ablation within the left atrium due to persistence of afib) 5. Intracardiac echocardiography. 6. Transseptal puncture of an intact septum. 7. Arrhythmia induction with pacing 8. External cardioversion. 9. Right femoral venous ultrasound guided access   ASSESSMENT AND  PLAN:  `1.  Chronic diastolic heart failure: The patient is now on Lasix 20 mg twice daily.  She would like to go down on the dose to once a day.  However her weight is unchanged although she does appear to be better compared to assessment note from last office visit.  She denies any lower extremity edema or dyspnea on exertion.  I will check a BM ET and a BNP to evaluate her status before making decision concerning decreasing her Lasix dose.    2.  Hypertension: Much better controlled at this time.  It appears increased dose of Lasix did assist with this.  We will continue losartan 100 mg daily carvedilol 3.125 mg daily repeating BMET today  3.  PAF: Continues on apixaban..  History of ablation 12/02/2019.  May consider stopping this if she remains in normal sinus rhythm.  She will follow up with EP Current medicines are reviewed at length with the patient  today.  I have spent 25 minutes dedicated to the care of this patient on the date of this encounter to include pre-visit review of records, assessment, management and diagnostic testing,with shared decision making.  Labs/ tests ordered today include: BMET  Phill Myron. West Pugh, ANP, [facility]   12/15/2019 11:38 AM    [facility] [place] [address] Office [phone] Fax [phone]  Notice: This dictation was prepared with Dragon dictation along with smaller phrase technology. Any transcriptional errors that result from this process are unintentional and may not be corrected upon review.

## 2019-12-17 ENCOUNTER — Ambulatory Visit: Payer: Medicare Other | Admitting: Adult Health

## 2019-12-17 ENCOUNTER — Other Ambulatory Visit: Payer: Self-pay

## 2019-12-17 ENCOUNTER — Encounter: Payer: Self-pay | Admitting: Adult Health

## 2019-12-17 VITALS — BP 130/70 | HR 72 | Temp 98.1°F | Ht 59.0 in | Wt 156.0 lb

## 2019-12-17 DIAGNOSIS — I5031 Acute diastolic (congestive) heart failure: Secondary | ICD-10-CM | POA: Diagnosis not present

## 2019-12-17 DIAGNOSIS — I480 Paroxysmal atrial fibrillation: Secondary | ICD-10-CM

## 2019-12-17 DIAGNOSIS — I10 Essential (primary) hypertension: Secondary | ICD-10-CM

## 2019-12-17 DIAGNOSIS — R0602 Shortness of breath: Secondary | ICD-10-CM | POA: Diagnosis not present

## 2019-12-17 DIAGNOSIS — Z79899 Other long term (current) drug therapy: Secondary | ICD-10-CM

## 2019-12-17 NOTE — Patient Instructions (Signed)
Medication Instructions:  Continue current medications  *If you need a refill on your cardiac medications before your next appointment, please call your pharmacy*   Lab Work: BMP and BNP  If you have labs (blood work) drawn today and your tests are completely normal, you will receive your results only by: Marland Kitchen MyChart Message (if you have MyChart) OR . A paper copy in the mail If you have any lab test that is abnormal or we need to change your treatment, we will call you to review the results.   Testing/Procedures: None Ordered   Follow-Up: At [facility], you and your health needs are our priority.  As part of our continuing mission to provide you with exceptional heart care, we have created designated Provider Care Teams.  These Care Teams include your primary Cardiologist (physician) and Advanced Practice Providers (APPs -  Physician Assistants and Nurse Practitioners) who all work together to provide you with the care you need, when you need it.  We recommend signing up for the patient portal called "MyChart".  Sign up information is provided on this After Visit Summary.  MyChart is used to connect with patients for Virtual Visits (Telemedicine).  Patients are able to view lab/test results, encounter notes, upcoming appointments, etc.  Non-urgent messages can be sent to your provider as well.   To learn more about what you can do with MyChart, go to [email or web address].    Your next appointment:   3 month(s)  The format for your next appointment:   In Person  Provider:   You may see Dr Jacques Navy or one of the following Advanced Practice Providers on your designated Care Team:    Theodore Demark, PA-C  Joni Reining, DNP, ANP  Cadence Fransico Michael, NP

## 2019-12-18 ENCOUNTER — Other Ambulatory Visit: Payer: Self-pay

## 2019-12-18 DIAGNOSIS — Z79899 Other long term (current) drug therapy: Secondary | ICD-10-CM

## 2019-12-18 LAB — BASIC METABOLIC PANEL
BUN/Creatinine Ratio: 17 (ref 12–28)
BUN: 18 mg/dL (ref 8–27)
CO2: 24 mmol/L (ref 20–29)
Calcium: 8.2 mg/dL — ABNORMAL LOW (ref 8.7–10.3)
Chloride: 101 mmol/L (ref 96–106)
Creatinine, Ser: 1.09 mg/dL — ABNORMAL HIGH (ref 0.57–1.00)
GFR calc Af Amer: 57 mL/min/{1.73_m2} — ABNORMAL LOW (ref >59)
GFR calc non Af Amer: 49 mL/min/{1.73_m2} — ABNORMAL LOW (ref >59)
Glucose: 155 mg/dL — ABNORMAL HIGH (ref 65–99)
Potassium: 4.4 mmol/L (ref 3.5–5.2)
Sodium: 141 mmol/L (ref 134–144)

## 2019-12-18 LAB — BRAIN NATRIURETIC PEPTIDE: BNP: 242 pg/mL — ABNORMAL HIGH (ref 0.0–100.0)

## 2019-12-18 MED ORDER — FUROSEMIDE 20 MG PO TABS
10.0000 mg | ORAL_TABLET | Freq: Every day | ORAL | 3 refills | Status: DC
Start: 2019-12-18 — End: 2019-12-31

## 2019-12-18 NOTE — Progress Notes (Signed)
Assessment/Plan:   1.  Rest tremor bilaterally  -Patient with some features of parkinsonism, but complains mostly of intention tremor.  I did not see that today.  We will schedule DaTscan.  -We will do TSH  2.  Anemia  -May be longstanding.  Patient is going to follow-up with primary care physician.  3.  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  Likely is the source of the wide-based gait.  Safety discussed.  A1c is under 7, at goal.  4.  Follow-up after the above has been completed.  Subjective:   Virginia Gilbert was seen today in the movement disorders clinic for neurologic consultation at the request of Berle Mull, MD.  The consultation is for the evaluation of R arm tremor. This patient is accompanied in the office by her daughter who supplements the history. Ortho records reviewed.  Hospital records reviewed.  Pt just in hospital at end of April for CAP.  Tremor: Yes.     How long has it been going on? 3-4 months ago, gotten worse  At rest or with activation?  activation  When is it noted the most?  writing  Fam hx of tremor?  No.  Located where?  R arm and occasionally L arm  Affected by caffeine:  No. (drinks 1 cup coffee/day)  Affected by alcohol:  Doesn't drink EtOH  Affected by stress:  No.  Affected by fatigue:  No.  Spills soup if on spoon:  No. but getting more troublesome  Spills glass of liquid if full:  No. , but it shakes  Tremor inducing meds:  No.   Meds to help tremor:  Yes, coreg  Other Specific Symptoms:  Voice: no change Sleep:   Vivid Dreams:  No.  Acting out dreams:  No. Wet Pillows: No. Postural symptoms:  Yes.    Falls?  No. Bradykinesia symptoms: no bradykinesia noted Loss of smell:  No. Loss of taste:  No. Urinary Incontinence:  Yes.  , wears depends for leakage Difficulty Swallowing:  No. Depression:  No. Memory changes:  Yes.   (mild, mostly names of people; able to remember how to get  places/take meds) N/V:  No. Lightheaded:  No.  Syncope: No. Diplopia:  No.  Neuroimaging of the brain has  previously been performed.  Patient had MRI of the brain in September, 2013 by [facility] neurology.    I did review it and it was essentially unremarkable.   ALLERGIES:   Allergies  Allergen Reactions  . Codeine Nausea And Vomiting  . Penicillins Anaphylaxis    Did it involve swelling of the face/tongue/throat, SOB, or low BP? Yes Did it involve sudden or severe rash/hives, skin peeling, or any reaction on the inside of your mouth or nose? Unknown Did you need to seek medical attention at a hospital or doctor's office? Yes--inpatient when reaction occurred When did it last happen?1964 or 1965 If all above answers are "NO", may proceed with cephalosporin use.   . Tramadol Itching    CURRENT MEDICATIONS:  Current Outpatient Medications  Medication Instructions  . acetaminophen (TYLENOL) 500 mg, Oral, As needed  . apixaban (ELIQUIS) 5 mg, Oral, 2 times daily  . carvedilol (COREG) 3.125 mg, Oral, 2 times daily  . furosemide (LASIX) 10 mg, Oral, Daily  . glimepiride (AMARYL) 4 mg, Oral, Daily with breakfast  . losartan (COZAAR) 100 mg, Oral, Daily  . metFORMIN (GLUCOPHAGE) 1,000 mg, Oral, 2 times daily  . pantoprazole (PROTONIX)  40 mg, Oral, Daily  . potassium chloride SA (KLOR-CON) 20 MEQ tablet 20 mEq, Oral, Daily  . Trulicity 1.5 mg, Subcutaneous, Every Fri  . Vitamin D-3 5,000 Units, Oral, Daily after lunch    Objective:   PHYSICAL EXAMINATION:    VITALS:   Vitals:   12/22/19 0906  BP: 130/66  Pulse: 60  SpO2: 91%  Weight: 160 lb (72.6 kg)  Height: 4\' 11"  (1.499 m)    GEN:  The patient appears stated age and is in NAD. HEENT:  Normocephalic, atraumatic.  The mucous membranes are moist. The superficial temporal arteries are without ropiness or tenderness. CV:  RRR Lungs:  CTAB Neck/HEME:  There are no carotid bruits bilaterally.  Neurological  examination:  Orientation: The patient is alert and oriented x3.  Cranial nerves: There is good facial symmetry.  Extraocular muscles are intact. The visual fields are full to confrontational testing. The speech is fluent and clear. Soft palate rises symmetrically and there is no tongue deviation. Hearing is intact to conversational tone. Sensation: Sensation is intact to light touch throughout (facial, trunk, extremities). Vibration is decreased at the bilateral big toe. There is no extinction with double simultaneous stimulation.  Motor: Strength is 5/5 in the bilateral upper and lower extremities.   Shoulder shrug is equal and symmetric.  There is no pronator drift. Deep tendon reflexes: Deep tendon reflexes are 0-1/4 at the bilateral biceps, triceps, brachioradialis, patella and achilles. Plantar responses are downgoing bilaterally.  Movement examination: Tone: There is normal tone in the bilateral upper extremities.  There is slight increased tone in the LLE  Abnormal movements: there is R>LUE rest tremor, with distraction only.  Does well with archimedes spirals.  No intention tremor.  No postural tremor.   Coordination:  There is min decremation with RAM's, with finger taps on the right.  Gait and Station: The patient has no difficulty arising out of a deep-seated chair without the use of the hands. The patient's stride length is good but wide based.   I have reviewed and interpreted the following labs independently   Chemistry      Component Value Date/Time   NA 141 12/17/2019 1455   K 4.4 12/17/2019 1455   CL 101 12/17/2019 1455   CO2 24 12/17/2019 1455   BUN 18 12/17/2019 1455   CREATININE 1.09 (H) 12/17/2019 1455      Component Value Date/Time   CALCIUM 8.2 (L) 12/17/2019 1455   ALKPHOS 47 12/04/2019 1041   AST 39 12/04/2019 1041   ALT 41 12/04/2019 1041   BILITOT 1.0 12/04/2019 1041      No results found for: TSH Lab Results  Component Value Date   WBC 9.7 12/05/2019     HGB 9.6 (L) 12/05/2019   HCT 31.5 (L) 12/05/2019   MCV 92.6 12/05/2019   PLT 138 (L) 12/05/2019   Lab Results  Component Value Date   HGBA1C 6.9 (H) 12/04/2019      Total time spent on today's visit was 45 minutes, including both face-to-face time and nonface-to-face time.  Time included that spent on review of records (prior notes available to me/labs/imaging if pertinent), discussing treatment and goals, answering patient's questions and coordinating care.  Cc:  12/06/2019, MD

## 2019-12-22 ENCOUNTER — Telehealth: Payer: Self-pay | Admitting: Internal Medicine

## 2019-12-22 ENCOUNTER — Other Ambulatory Visit: Payer: Medicare Other

## 2019-12-22 ENCOUNTER — Encounter: Payer: Self-pay | Admitting: Neurology

## 2019-12-22 ENCOUNTER — Other Ambulatory Visit: Payer: Self-pay

## 2019-12-22 ENCOUNTER — Ambulatory Visit: Payer: Medicare Other | Admitting: Neurology

## 2019-12-22 VITALS — BP 130/66 | HR 60 | Ht 59.0 in | Wt 160.0 lb

## 2019-12-22 DIAGNOSIS — E1142 Type 2 diabetes mellitus with diabetic polyneuropathy: Secondary | ICD-10-CM | POA: Diagnosis not present

## 2019-12-22 DIAGNOSIS — R251 Tremor, unspecified: Secondary | ICD-10-CM

## 2019-12-22 DIAGNOSIS — D649 Anemia, unspecified: Secondary | ICD-10-CM | POA: Diagnosis not present

## 2019-12-22 LAB — TSH: TSH: 2.87 u[IU]/mL (ref 0.35–4.50)

## 2019-12-22 NOTE — Telephone Encounter (Signed)
Left message to call back  

## 2019-12-22 NOTE — Patient Instructions (Addendum)
After Visit Summary:   Medication Changes: Your physician recommends that you continue on your current medications as directed. Please refer to the Current Medication list given to you today.   Lab work: Your physician recommends that you return for lab work in: TODAY-TSH The lab is located on the Second floor at [address]. When you get off the elevator, turn right and go in the [facility] [address]; the first brown door on the left.  Tell the ladies behind the desk that you are there for lab work. If you are not called within 15 minutes please check with the front desk.  Once you complete your labs you are free to go. You will receive a call or message via MyChart with your lab results.    Testing: Your physician would like for you to have a DatScan. This test will be completed at the hospital. Once this test has been approved by your insurance someone will contact your from the hospital.    Referral:  None    Follow up:  Your physician recommends that you schedule a follow-up appointment in: 8-10 weeks with Dr Tat after Charlann Noss. So once your DatScan is schedule please contact the office to schedule your follow up appointment.

## 2019-12-22 NOTE — Telephone Encounter (Signed)
Spoke with patient and her weight went from 155-160 today  She did take full Lasix (was decreased to 1/2 last week), increased urinating since  Denies any change in breathing Yesterday she did eat [employer] take out Advised to watch her sodium and would forward to Harriet Pho DNP for review

## 2019-12-22 NOTE — Telephone Encounter (Signed)
She will need to take a full dose of lasix today due to high salt meal yesterday. If she has taken the 1/2 tablet, she will need to take an additional 1/2 tablet this afternoon and go back to usual doses tomorrow. Thank you,   KL

## 2019-12-22 NOTE — Telephone Encounter (Signed)
New message   Patient wants to know if she should start taking the whole pill again for furosemide (LASIX) 20 MG tablet because she is having swelling in ankles.

## 2019-12-23 ENCOUNTER — Telehealth: Payer: Self-pay | Admitting: Neurology

## 2019-12-23 NOTE — Telephone Encounter (Signed)
Spoke with patient. Patient reports she took a whole dose of Lasix yesterday due to weight going from 155-160lbs. Today she has only taken half a tablet. She reports she is 158lbs today. Breathing is better than it was yesterday. No other changes. Will route to Joni Reining, DNP to see if she recommends patient increase Lasix today as well.

## 2019-12-23 NOTE — Telephone Encounter (Signed)
Patient called in and said to cancel the testing she is supposed to have at [facility] coming up. She is also canceling 01/29/20 follow up appointment.p

## 2019-12-23 NOTE — Telephone Encounter (Signed)
Contacted Dr Elmyra Ricks office has been notified. Receptionist stated Dr Selena Batten in out on leave, but she would make his nurse aware.

## 2019-12-23 NOTE — Telephone Encounter (Signed)
I am glad that the extra dose of lasix helped. She may take one more extra dose and then go  back to the usual daily dose. She may need to be on higher doses every other day if necessary. We can reassess this if she continues to have weight gain despite taking the lasix at the lower dose.   Thank you, Samara Deist

## 2019-12-23 NOTE — Telephone Encounter (Signed)
Spoke with patient to make sure everything was ok and she stated she has just decided she does not want to follow up or have any other testing.   All future appointments are canceled.

## 2019-12-23 NOTE — Telephone Encounter (Signed)
Patient returning call.

## 2019-12-23 NOTE — Telephone Encounter (Signed)
Spoke with patient. Patient advised of Samara Deist Lawrence's recommendation to take an additional 1/2 pill for today and resume regular dosing tomorrow. Patient verbalized understanding and will call back if she has continued weight / edema issues.

## 2019-12-23 NOTE — Telephone Encounter (Signed)
Please let PCP know as she probably does need continued neuro care.

## 2019-12-25 ENCOUNTER — Other Ambulatory Visit: Payer: Self-pay

## 2019-12-25 DIAGNOSIS — Z79899 Other long term (current) drug therapy: Secondary | ICD-10-CM

## 2019-12-30 ENCOUNTER — Telehealth: Payer: Self-pay | Admitting: Internal Medicine

## 2019-12-30 ENCOUNTER — Ambulatory Visit (HOSPITAL_COMMUNITY): Payer: Medicare Other | Admitting: Physician Assistant

## 2019-12-30 NOTE — Telephone Encounter (Signed)
Scheduled appointment tomorrow at 1:20, patient aware of location

## 2019-12-30 NOTE — Telephone Encounter (Signed)
Please offer her an appointment on my schedule tomorrow. 1:20 pm is available.

## 2019-12-30 NOTE — Telephone Encounter (Signed)
Feet swelling started couple of days ago, today worse. Feels weak  Blood pressure 141/66 HR 71  Weakness started late yesterday afternoon  O2 sat 94%, feels like can not get a good deep breath,  Normal weight 154-155 lbs, 159 lb today  No change in diet, avoids salt  Swelling does not go down at night, up 3 times to urinate  Lasix decreased to 10 mg last week secondary to labs Will forward to Dr Jacques Navy and Jarold Motto DNP  Cell # [phone]

## 2019-12-30 NOTE — Telephone Encounter (Signed)
New message   Pt c/o swelling: STAT is pt has developed SOB within 24 hours  1) How much weight have you gained and in what time span? Patient states that she has gained about 5 lbs within a week  If swelling, where is the swelling located? In both feet 2) Are you currently taking a fluid pill? Yes   3) Are you currently SOB? Yes   4) Do you have a log of your daily weights (if so, list)? Yes   5) Have you gained 3 pounds in a day or 5 pounds in a week? Yes   6) Have you traveled recently? No

## 2019-12-31 ENCOUNTER — Encounter: Payer: Self-pay | Admitting: Internal Medicine

## 2019-12-31 ENCOUNTER — Other Ambulatory Visit: Payer: Self-pay

## 2019-12-31 ENCOUNTER — Ambulatory Visit: Payer: Medicare Other | Admitting: Internal Medicine

## 2019-12-31 VITALS — BP 168/88 | HR 103 | Ht 59.0 in | Wt 159.8 lb

## 2019-12-31 DIAGNOSIS — I480 Paroxysmal atrial fibrillation: Secondary | ICD-10-CM

## 2019-12-31 DIAGNOSIS — Z79899 Other long term (current) drug therapy: Secondary | ICD-10-CM | POA: Diagnosis not present

## 2019-12-31 DIAGNOSIS — I4819 Other persistent atrial fibrillation: Secondary | ICD-10-CM

## 2019-12-31 DIAGNOSIS — I5031 Acute diastolic (congestive) heart failure: Secondary | ICD-10-CM | POA: Diagnosis not present

## 2019-12-31 MED ORDER — FUROSEMIDE 20 MG PO TABS: 20.0000 mg | ORAL_TABLET | Freq: Two times a day (BID) | ORAL | 6 refills | Status: DC

## 2019-12-31 NOTE — Patient Instructions (Signed)
Medication Instructions:    stop current Furosemide dose Start taking 20 mg Furosemide (lasix)  One tablet twice a day   *If you need a refill on your cardiac medications before your next appointment, please call your pharmacy*   Lab Work: Please have lab done 01/05/20 BMP- when you go to afib clinic  If you have labs (blood work) drawn today and your tests are completely normal, you will receive your results only by: Marland Kitchen MyChart Message (if you have MyChart) OR . A paper copy in the mail If you have any lab test that is abnormal or we need to change your treatment, we will call you to review the results.   Testing/Procedures: Not needed   Follow-Up: At [facility], you and your health needs are our priority.  As part of our continuing mission to provide you with exceptional heart care, we have created designated Provider Care Teams.  These Care Teams include your primary Cardiologist (physician) and Advanced Practice Providers (APPs -  Physician Assistants and Nurse Practitioners) who all work together to provide you with the care you need, when you need it.  We recommend signing up for the patient portal called "MyChart".  Sign up information is provided on this After Visit Summary.  MyChart is used to connect with patients for Virtual Visits (Telemedicine).  Patients are able to view lab/test results, encounter notes, upcoming appointments, etc.  Non-urgent messages can be sent to your provider as well.   To learn more about what you can do with MyChart, go to [email or web address].    Your next appointment:   Jan 13, 2020   The format for your next appointment:   In Person  Provider:   Weston Brass, MD   Other Instructions Keep appointment  C Fenton Pa at the [facility]

## 2019-12-31 NOTE — Progress Notes (Signed)
Cardiology Office Note:    Date:  12/31/2019   ID:  SHAKILA MAK, DOB 02/28/1941, MRN [id number]  PCP:  Pearson Grippe, MD  Cardiologist:  No primary care provider on file.  Electrophysiologist:  None   Referring MD: Pearson Grippe, MD   Chief Complaint: SOB, LE swelling  History of Present Illness:    Virginia Gilbert is a 79 y.o. female well known to my clinic. Recent AF ablation and subsequent hospitalization for acute diastolic heart failure.   She describes several days of feet swelling worsened yesterday when she called into the office for guidance.  Appointment arranged after discussion of symptoms.  She is feeling weak which started 2 days ago,  weight is up 4 pounds with no change in diet or intake of salt.  She notes getting up 3 times at night to urinate, swelling does not decrease despite elevation of legs.  Lasix was decreased 1 week ago due to creatinine rising from 0.81 to 1.09. She has been taking lasix 10 mg daily.   She is SOB, with LE swelling today. She is in atrial fibrillation on ECG. No chest pain, no definite palpitations.  Past Medical History:  Diagnosis Date  . Diabetes mellitus without complication (HCC)   . Hyperlipidemia   . Hypertension   . Mitral regurgitation    evaluated by TEE 09/2019  . Persistent atrial fibrillation [facility])     Past Surgical History:  Procedure Laterality Date  . ATRIAL FIBRILLATION ABLATION N/A 12/02/2019   Procedure: ATRIAL FIBRILLATION ABLATION;  Surgeon: Hillis Range, MD;  Location: [facility] INVASIVE CV LAB;  Service: Cardiovascular;  Laterality: N/A;  . BUBBLE STUDY  10/08/2019   Procedure: BUBBLE STUDY;  Surgeon: [name], MD;  Location: [facility] ENDOSCOPY;  Service: Cardiology;;  . CARDIOVERSION N/A 07/08/2019   Procedure: CARDIOVERSION;  Surgeon: Yates Decamp, MD;  Location: [facility] ENDOSCOPY;  Service: Cardiovascular;  Laterality: N/A;  . CARDIOVERSION N/A 09/02/2019   Procedure: CARDIOVERSION;  Surgeon: Yates Decamp, MD;  Location:  [facility] ENDOSCOPY;  Service: Cardiovascular;  Laterality: N/A;  . CATARACT EXTRACTION, BILATERAL    . PARTIAL HYSTERECTOMY     1977  . TEE WITHOUT CARDIOVERSION N/A 10/08/2019   Procedure: TRANSESOPHAGEAL ECHOCARDIOGRAM (TEE);  Surgeon: [name], MD;  Location: [facility] ENDOSCOPY;  Service: Cardiology;  Laterality: N/A;    Current Medications: Current Meds  Medication Sig  . acetaminophen (TYLENOL) 500 MG tablet Take 500 mg by mouth as needed.  Marland Kitchen apixaban (ELIQUIS) 5 MG TABS tablet Take 1 tablet (5 mg total) by mouth 2 (two) times daily.  . carvedilol (COREG) 3.125 MG tablet Take 3.125 mg by mouth 2 (two) times daily.   . Cholecalciferol (VITAMIN D-3) 125 MCG (5000 UT) TABS Take 5,000 Units by mouth daily after lunch.  . furosemide (LASIX) 20 MG tablet Take 0.5 tablets (10 mg total) by mouth daily.  Marland Kitchen glimepiride (AMARYL) 4 MG tablet Take 1 tablet (4 mg total) by mouth daily with breakfast.  . losartan (COZAAR) 100 MG tablet Take 100 mg by mouth daily.   . metFORMIN (GLUCOPHAGE) 1000 MG tablet Take 1,000 mg by mouth 2 (two) times daily.   . pantoprazole (PROTONIX) 40 MG tablet Take 1 tablet (40 mg total) by mouth daily.  . potassium chloride SA (KLOR-CON) 20 MEQ tablet Take 1 tablet (20 mEq total) by mouth daily.  . TRULICITY 1.5 MG/0.5ML SOPN Inject 1.5 mg into the skin every Friday.      Allergies:   Codeine,  Penicillins, and Tramadol   Social History   Socioeconomic History  . Marital status: Widowed    Spouse name: Not on file  . Number of children: 3  . Years of education: Not on file  . Highest education level: Not on file  Occupational History  . Not on file  Tobacco Use  . Smoking status: Former Smoker    Packs/day: 0.50    Years: 15.00    Pack years: 7.50    Types: Cigarettes    Quit date: 05/14/1985    Years since quitting: 34.6  . Smokeless tobacco: Never Used  Substance and Sexual Activity  . Alcohol use: No    Alcohol/week: 0.0 standard drinks  . Drug use:  Never  . Sexual activity: Not on file  Other Topics Concern  . Not on file  Social History Narrative  . Not on file   Social Determinants of Health   Financial Resource Strain:   . Difficulty of Paying Living Expenses:   Food Insecurity:   . Worried About Charity fundraiser in the Last Year:   . Arboriculturist in the Last Year:   Transportation Needs:   . Film/video editor (Medical):   Marland Kitchen Lack of Transportation (Non-Medical):   Physical Activity:   . Days of Exercise per Week:   . Minutes of Exercise per Session:   Stress:   . Feeling of Stress :   Social Connections:   . Frequency of Communication with Friends and Family:   . Frequency of Social Gatherings with Friends and Family:   . Attends Religious Services:   . Active Member of Clubs or Organizations:   . Attends Archivist Meetings:   Marland Kitchen Marital Status:      Family History: The patient's family history includes Breast cancer in her daughter and daughter; Diabetes in her brother and mother; Emphysema in her brother and father; Heart disease in her brother, brother, and mother.  ROS:   Please see the history of present illness.    All other systems reviewed and are negative.  EKGs/Labs/Other Studies Reviewed:    The following studies were reviewed today:  EKG:  Atrial fibrillation, rate 96  Recent Labs: 12/04/2019: ALT 41 12/05/2019: Hemoglobin 9.6; Platelets 138 12/17/2019: BNP 242.0; BUN 18; Creatinine, Ser 1.09; Potassium 4.4; Sodium 141 12/22/2019: TSH 2.87  Recent Lipid Panel No results found for: CHOL, TRIG, HDL, CHOLHDL, VLDL, LDLCALC, LDLDIRECT  Physical Exam:    VS:  BP (!) 168/88   Pulse (!) 103   Ht 4\' 11"  (1.499 m)   Wt 159 lb 12.8 oz (72.5 kg)   BMI 32.28 kg/m     Wt Readings from Last 5 Encounters:  12/31/19 159 lb 12.8 oz (72.5 kg)  12/22/19 160 lb (72.6 kg)  12/17/19 156 lb (70.8 kg)  12/10/19 155 lb 12.8 oz (70.7 kg)  12/04/19 151 lb 14.4 oz (68.9 kg)      Constitutional: No acute distress Eyes: sclera non-icteric, normal conjunctiva and lids ENMT: normal dentition, moist mucous membranes Cardiovascular: irregularly irregular rhythm, normal rate, no murmurs. S1 and S2 normal. Radial pulses normal bilaterally. No jugular venous distention.  Respiratory: clear to auscultation bilaterally GI : normal bowel sounds, soft and nontender. No distention.   MSK: extremities warm, well perfused. 1+ edema.  NEURO: grossly nonfocal exam, moves all extremities. PSYCH: alert and oriented x 3, normal mood and affect.   ASSESSMENT:    1. Persistent atrial fibrillation ([place])  2. Paroxysmal atrial fibrillation (HCC)   3. Medication management   4. Acute diastolic heart failure (HCC)    PLAN:    Paroxysmal atrial fibrillation (HCC) after atrial fibrillation ablation- Plan: EKG 12-Lead, Basic metabolic panel, Basic metabolic panel  Medication management - Plan: EKG 12-Lead, Basic metabolic panel, Basic metabolic panel  Acute diastolic heart failure (HCC) - Plan: EKG 12-Lead, Basic metabolic panel, Basic metabolic panel   We will increase lasix to 20 mg BID and she will be seen in afib clinic on Monday 5/24 as previously scheduled. BMET at that time to monitor renal function. I suspect 10 mg of lasix was not enough to maintain euvolemia.  Tolerating Eliquis without bleeding. Continue.  Rate is reasonably controlled at this time, continue carvedilol.  Mildly elevated blood pressure, will continue current BP therapy and increase lasix.   Total time of encounter: 30 minutes total time of encounter, including 25 minutes spent in face-to-face patient care on the date of this encounter. This time includes coordination of care and counseling regarding above mentioned problem list. Remainder of non-face-to-face time involved reviewing chart documents/testing relevant to the patient encounter and documentation in the medical record. I have independently  reviewed documentation from referring provider.   Weston Brass, MD [place]  [facility]    Medication Adjustments/Labs and Tests Ordered: Current medicines are reviewed at length with the patient today.  Concerns regarding medicines are outlined above.  Orders Placed This Encounter  Procedures  . Basic metabolic panel  . EKG 12-Lead   Meds ordered this encounter  Medications  . furosemide (LASIX) 20 MG tablet    Sig: Take 1 tablet (20 mg total) by mouth 2 (two) times daily.    Dispense:  60 tablet    Refill:  6    Does not need med at present patient will contact you    Patient Instructions  Medication Instructions:    stop current Furosemide dose Start taking 20 mg Furosemide (lasix)  One tablet twice a day   *If you need a refill on your cardiac medications before your next appointment, please call your pharmacy*   Lab Work: Please have lab done 01/05/20 BMP- when you go to afib clinic  If you have labs (blood work) drawn today and your tests are completely normal, you will receive your results only by: Marland Kitchen MyChart Message (if you have MyChart) OR . A paper copy in the mail If you have any lab test that is abnormal or we need to change your treatment, we will call you to review the results.   Testing/Procedures: Not needed   Follow-Up: At [facility], you and your health needs are our priority.  As part of our continuing mission to provide you with exceptional heart care, we have created designated Provider Care Teams.  These Care Teams include your primary Cardiologist (physician) and Advanced Practice Providers (APPs -  Physician Assistants and Nurse Practitioners) who all work together to provide you with the care you need, when you need it.  We recommend signing up for the patient portal called "MyChart".  Sign up information is provided on this After Visit Summary.  MyChart is used to connect with patients for Virtual Visits (Telemedicine).  Patients  are able to view lab/test results, encounter notes, upcoming appointments, etc.  Non-urgent messages can be sent to your provider as well.   To learn more about what you can do with MyChart, go to [email or web address].    Your next appointment:  Jan 13, 2020   The format for your next appointment:   In Person  Provider:   Weston Brass, MD   Other Instructions Keep appointment  [facility] [place] at the [facility]

## 2020-01-05 ENCOUNTER — Ambulatory Visit (HOSPITAL_COMMUNITY)
Admission: RE | Admit: 2020-01-05 | Discharge: 2020-01-05 | Disposition: A | Discharge status: Home / Self Care | Payer: Medicare Other | Source: Ambulatory Visit | Attending: Physician Assistant | Admitting: Physician Assistant

## 2020-01-05 ENCOUNTER — Other Ambulatory Visit: Payer: Self-pay

## 2020-01-05 ENCOUNTER — Encounter (HOSPITAL_COMMUNITY): Payer: Self-pay | Admitting: Physician Assistant

## 2020-01-05 VITALS — BP 158/82 | HR 80 | Ht 59.0 in | Wt 156.6 lb

## 2020-01-05 DIAGNOSIS — Z87891 Personal history of nicotine dependence: Secondary | ICD-10-CM | POA: Insufficient documentation

## 2020-01-05 DIAGNOSIS — Z7901 Long term (current) use of anticoagulants: Secondary | ICD-10-CM | POA: Diagnosis not present

## 2020-01-05 DIAGNOSIS — Z833 Family history of diabetes mellitus: Secondary | ICD-10-CM | POA: Insufficient documentation

## 2020-01-05 DIAGNOSIS — Z6831 Body mass index (BMI) 31.0-31.9, adult: Secondary | ICD-10-CM | POA: Diagnosis not present

## 2020-01-05 DIAGNOSIS — I4819 Other persistent atrial fibrillation: Secondary | ICD-10-CM | POA: Diagnosis not present

## 2020-01-05 DIAGNOSIS — Z885 Allergy status to narcotic agent status: Secondary | ICD-10-CM | POA: Diagnosis not present

## 2020-01-05 DIAGNOSIS — Z7984 Long term (current) use of oral hypoglycemic drugs: Secondary | ICD-10-CM | POA: Diagnosis not present

## 2020-01-05 DIAGNOSIS — E785 Hyperlipidemia, unspecified: Secondary | ICD-10-CM | POA: Insufficient documentation

## 2020-01-05 DIAGNOSIS — I080 Rheumatic disorders of both mitral and aortic valves: Secondary | ICD-10-CM | POA: Diagnosis not present

## 2020-01-05 DIAGNOSIS — I484 Atypical atrial flutter: Secondary | ICD-10-CM | POA: Insufficient documentation

## 2020-01-05 DIAGNOSIS — Z880 Allergy status to penicillin: Secondary | ICD-10-CM | POA: Insufficient documentation

## 2020-01-05 DIAGNOSIS — E669 Obesity, unspecified: Secondary | ICD-10-CM | POA: Diagnosis not present

## 2020-01-05 DIAGNOSIS — E119 Type 2 diabetes mellitus without complications: Secondary | ICD-10-CM | POA: Insufficient documentation

## 2020-01-05 DIAGNOSIS — D6869 Other thrombophilia: Secondary | ICD-10-CM | POA: Diagnosis not present

## 2020-01-05 DIAGNOSIS — I110 Hypertensive heart disease with heart failure: Secondary | ICD-10-CM | POA: Insufficient documentation

## 2020-01-05 DIAGNOSIS — I5032 Chronic diastolic (congestive) heart failure: Secondary | ICD-10-CM | POA: Diagnosis not present

## 2020-01-05 DIAGNOSIS — Z79899 Other long term (current) drug therapy: Secondary | ICD-10-CM | POA: Insufficient documentation

## 2020-01-05 DIAGNOSIS — Z8249 Family history of ischemic heart disease and other diseases of the circulatory system: Secondary | ICD-10-CM | POA: Insufficient documentation

## 2020-01-05 LAB — BASIC METABOLIC PANEL
Anion gap: 11 (ref 5–15)
BUN: 19 mg/dL (ref 8–23)
CO2: 28 mmol/L (ref 22–32)
Calcium: 8.6 mg/dL — ABNORMAL LOW (ref 8.9–10.3)
Chloride: 99 mmol/L (ref 98–111)
Creatinine, Ser: 0.79 mg/dL (ref 0.44–1.00)
GFR calc Af Amer: >60 mL/min (ref >60)
GFR calc non Af Amer: >60 mL/min (ref >60)
Glucose, Bld: 123 mg/dL — ABNORMAL HIGH (ref 70–99)
Potassium: 3.8 mmol/L (ref 3.5–5.1)
Sodium: 138 mmol/L (ref 135–145)

## 2020-01-05 LAB — CBC
HCT: 34.4 % — ABNORMAL LOW (ref 36.0–46.0)
Hemoglobin: 10.1 g/dL — ABNORMAL LOW (ref 12.0–15.0)
MCH: 27.8 pg (ref 26.0–34.0)
MCHC: 29.4 g/dL — ABNORMAL LOW (ref 30.0–36.0)
MCV: 94.8 fL (ref 80.0–100.0)
Platelets: 271 10*3/uL (ref 150–400)
RBC: 3.63 MIL/uL — ABNORMAL LOW (ref 3.87–5.11)
RDW: 15.9 % — ABNORMAL HIGH (ref 11.5–15.5)
WBC: 4.5 10*3/uL (ref 4.0–10.5)
nRBC: 0 % (ref 0.0–0.2)

## 2020-01-05 MED ORDER — AMIODARONE HCL 200 MG PO TABS
200.0000 mg | ORAL_TABLET | Freq: Every day | ORAL | 3 refills | Status: DC
Start: 2020-01-05 — End: 2020-03-01

## 2020-01-05 NOTE — H&P (View-Only) (Signed)
Primary Care Physician: Pearson Grippe, MD Primary Cardiologist: Dr Jacques Navy Primary Electrophysiologist: Dr Johney Frame Referring Physician: Dr Albertina Parr Virginia Gilbert is a 79 y.o. female with a history of DM, HLD, HTN, and persistent atrial fibrillation who presents for follow up in the [facility]. The patient was initially diagnosed with atrial fibrillation 05/2019 after presenting with symptoms of worsening dyspnea on exertion. Patient is on Eliquis for a CHADS2VASC score of 5. There were no specific triggers that the patient could identify. She underwent DCCV on 07/08/19 but had ERAF. She was loaded on amiodarone and had DCCV again on 09/02/19 but unfortunately she was back in afib again. She denies snoring or alcohol use.  On follow up today, patient is s/p afib ablation with Dr Johney Frame on 12/02/19. Patient reports that she has done reasonably well since her last visit. She was hospitalized for acute diastolic CHF two days post ablation and was diuresed. She denies CP, swallowing, or groin issues. She remains in atypical atrial flutter today.   Today, she denies symptoms of palpitations, chest pain, shortness of breath, orthopnea, PND, dizziness, presyncope, syncope, snoring, daytime somnolence, bleeding, or neurologic sequela. The patient is tolerating medications without difficulties and is otherwise without complaint today.    Atrial Fibrillation Risk Factors:  she does not have symptoms or diagnosis of sleep apnea. she does not have a history of rheumatic fever. she does not have a history of alcohol use. The patient does not have a history of early familial atrial fibrillation or other arrhythmias.  she has a BMI of Body mass index is 31.63 kg/m.Marland Kitchen Filed Weights   01/05/20 0838  Weight: 71 kg    Family History  Problem Relation Age of Onset  . Heart disease Mother   . Diabetes Mother   . Emphysema Father   . Emphysema Brother   . Heart disease Brother    . Diabetes Brother   . Heart disease Brother   . Breast cancer Daughter   . Breast cancer Daughter      Atrial Fibrillation Management history:  Previous antiarrhythmic drugs: amiodarone Previous cardioversions: 07/08/19, 09/02/19 Previous ablations: 12/02/19 CHADS2VASC score: 5 Anticoagulation history: Eliquis   Past Medical History:  Diagnosis Date  . Diabetes mellitus without complication (HCC)   . Hyperlipidemia   . Hypertension   . Mitral regurgitation    evaluated by TEE 09/2019  . Persistent atrial fibrillation [facility])    Past Surgical History:  Procedure Laterality Date  . ATRIAL FIBRILLATION ABLATION N/A 12/02/2019   Procedure: ATRIAL FIBRILLATION ABLATION;  Surgeon: Hillis Range, MD;  Location: [facility] INVASIVE CV LAB;  Service: Cardiovascular;  Laterality: N/A;  . BUBBLE STUDY  10/08/2019   Procedure: BUBBLE STUDY;  Surgeon: [name], MD;  Location: [facility] ENDOSCOPY;  Service: Cardiology;;  . CARDIOVERSION N/A 07/08/2019   Procedure: CARDIOVERSION;  Surgeon: Yates Decamp, MD;  Location: [facility] ENDOSCOPY;  Service: Cardiovascular;  Laterality: N/A;  . CARDIOVERSION N/A 09/02/2019   Procedure: CARDIOVERSION;  Surgeon: Yates Decamp, MD;  Location: [facility] ENDOSCOPY;  Service: Cardiovascular;  Laterality: N/A;  . CATARACT EXTRACTION, BILATERAL    . PARTIAL HYSTERECTOMY     1977  . TEE WITHOUT CARDIOVERSION N/A 10/08/2019   Procedure: TRANSESOPHAGEAL ECHOCARDIOGRAM (TEE);  Surgeon: [name], MD;  Location: [facility] ENDOSCOPY;  Service: Cardiology;  Laterality: N/A;    Current Outpatient Medications  Medication Sig Dispense Refill  . acetaminophen (TYLENOL) 500 MG tablet Take 500 mg by mouth as  needed.    Marland Kitchen apixaban (ELIQUIS) 5 MG TABS tablet Take 1 tablet (5 mg total) by mouth 2 (two) times daily. 180 tablet 1  . carvedilol (COREG) 3.125 MG tablet Take 3.125 mg by mouth 2 (two) times daily.   12  . Cholecalciferol (VITAMIN D-3) 125 MCG (5000 UT) TABS Take 5,000 Units by mouth  daily after lunch.    . furosemide (LASIX) 20 MG tablet Take 1 tablet (20 mg total) by mouth 2 (two) times daily. 60 tablet 6  . glimepiride (AMARYL) 4 MG tablet Take 1 tablet (4 mg total) by mouth daily with breakfast.  1  . losartan (COZAAR) 100 MG tablet Take 100 mg by mouth daily.   6  . metFORMIN (GLUCOPHAGE) 1000 MG tablet Take 1,000 mg by mouth 2 (two) times daily.   6  . pantoprazole (PROTONIX) 40 MG tablet Take 1 tablet (40 mg total) by mouth daily. 45 tablet 0  . potassium chloride SA (KLOR-CON) 20 MEQ tablet Take 1 tablet (20 mEq total) by mouth daily. 30 tablet 6  . TRULICITY 1.5 IW/5.8KD SOPN Inject 1.5 mg into the skin every Friday.     Marland Kitchen amiodarone (PACERONE) 200 MG tablet Take 1 tablet (200 mg total) by mouth daily. 30 tablet 3   No current facility-administered medications for this encounter.    Allergies  Allergen Reactions  . Codeine Nausea And Vomiting  . Penicillins Anaphylaxis    Did it involve swelling of the face/tongue/throat, SOB, or low BP? Yes Did it involve sudden or severe rash/hives, skin peeling, or any reaction on the inside of your mouth or nose? Unknown Did you need to seek medical attention at a hospital or doctor's office? Yes--inpatient when reaction occurred When did it last happen?1964 or 1965 If all above answers are "NO", may proceed with cephalosporin use.   . Tramadol Itching    Social History   Socioeconomic History  . Marital status: Widowed    Spouse name: Not on file  . Number of children: 3  . Years of education: Not on file  . Highest education level: Not on file  Occupational History  . Not on file  Tobacco Use  . Smoking status: Former Smoker    Packs/day: 0.50    Years: 15.00    Pack years: 7.50    Types: Cigarettes    Quit date: 05/14/1985    Years since quitting: 34.6  . Smokeless tobacco: Never Used  Substance and Sexual Activity  . Alcohol use: No    Alcohol/week: 0.0 standard drinks  . Drug use: Never  .  Sexual activity: Not on file  Other Topics Concern  . Not on file  Social History Narrative  . Not on file   Social Determinants of Health   Financial Resource Strain:   . Difficulty of Paying Living Expenses:   Food Insecurity:   . Worried About Charity fundraiser in the Last Year:   . Arboriculturist in the Last Year:   Transportation Needs:   . Film/video editor (Medical):   Marland Kitchen Lack of Transportation (Non-Medical):   Physical Activity:   . Days of Exercise per Week:   . Minutes of Exercise per Session:   Stress:   . Feeling of Stress :   Social Connections:   . Frequency of Communication with Friends and Family:   . Frequency of Social Gatherings with Friends and Family:   . Attends Religious Services:   . Active  Member of Clubs or Organizations:   . Attends Club or Organization Meetings:   . Marital Status:   Intimate Partner Violence:   . Fear of Current or Ex-Partner:   . Emotionally Abused:   . Physically Abused:   . Sexually Abused:      ROS- All systems are reviewed and negative except as per the HPI above.  Physical Exam: Vitals:   01/05/20 0838  BP: (!) 158/82  Pulse: 80  Weight: 71 kg  Height: 4' 11" (1.499 m)    GEN- The patient is well appearing obese elderly female, alert and oriented x 3 today.   HEENT-head normocephalic, atraumatic, sclera clear, conjunctiva pink, hearing intact, trachea midline. Lungs- Clear to ausculation bilaterally, normal work of breathing Heart- Regular rate and rhythm, no murmurs, rubs or gallops  GI- soft, NT, ND, + BS Extremities- no clubbing, cyanosis, or edema MS- no significant deformity or atrophy Skin- no rash or lesion Psych- euthymic mood, full affect Neuro- strength and sensation are intact   Wt Readings from Last 3 Encounters:  01/05/20 71 kg  12/31/19 72.5 kg  12/22/19 72.6 kg    EKG today demonstrates atypical atrial flutter HR 80, QRS 72, QTc 429  Echo 12/05/19 demonstrated  1. Patient  appears to be in atrial flutter throughout exam.  2. Left ventricular ejection fraction, by estimation, is 60 to 65%. The  left ventricle has normal function. The left ventricle has no regional  wall motion abnormalities. Left ventricular diastolic parameters are  indeterminate.  3. Right ventricular systolic function is normal. The right ventricular  size is normal. There is mildly elevated pulmonary artery systolic  pressure.  4. Left atrial size was moderately dilated.  5. The mitral valve is normal in structure. Mild mitral valve  regurgitation. No evidence of mitral stenosis.  6. The aortic valve is tricuspid. Aortic valve regurgitation is trivial.  Mild to moderate aortic valve sclerosis/calcification is present, without  any evidence of aortic stenosis.  7. The inferior vena cava is normal in size with greater than 50%  respiratory variability, suggesting right atrial pressure of 3 mmHg.   Epic records are reviewed at length today  CHA2DS2-VASc Score = 5 The patient's score is based upon: CHF History: No HTN History: Yes Age : 77 + Diabetes History: Yes Stroke History: No Vascular Disease History: No Gender: Female   ASSESSMENT AND PLAN: 1. Persistent Atrial Fibrillation/atrial flutter The patient's CHA2DS2-VASc score is 5, indicating a 7.2% annual risk of stroke.   S/p ablation 12/02/19 She remains in atypical atrial flutter today. Reassured patient that recurrence of atrial arrhythmias is common in the first 3 months post ablation. Will restart amiodarone 200 mg daily. After discussing the risks and benefits, will arrange for DCCV as it appears she has been persistent.  Check bmet/CBC Continue Eliquis 5 mg BID Continue coreg 3.125 mg BID  2. Secondary Hypercoagulable State (ICD10:  D68.69) The patient is at significant risk for stroke/thromboembolism based upon her CHA2DS2-VASc Score of 5.  Continue Apixaban (Eliquis).   3. Obesity Body mass index is 31.63  kg/m. Lifestyle modification was discussed and encouraged including regular physical activity and weight reduction.  4. HTN Stable, no changes today.  5. Chronic diastolic CHF Patient's weight is down ~4 lbs. No signs or symptoms of fluid overload today.  Repeat bmet as above.   Follow up with Dr Acharya and Dr Allred as scheduled.    Ricky Slevin Gunby PA-C Afib [facility] [address]   [address] [place], [place] [address] [phone] 01/05/2020 9:13 AM

## 2020-01-05 NOTE — Patient Instructions (Signed)
Cardioversion scheduled for Tuesday, June 1st  - Arrive at the [facility] and go to admitting at 8:30AM  - Do not eat or drink anything after midnight the night prior to your procedure.  - Take all your morning medication (except diabetic medications) with a sip of water prior to arrival.  - You will not be able to drive home after your procedure.  - Do NOT miss any doses of your blood thinner - if you should miss a dose please notify our office immediately.  Restart Amiodarone 200mg  once a day

## 2020-01-05 NOTE — Progress Notes (Signed)
Primary Care Physician: Pearson Grippe, MD Primary Cardiologist: Dr Jacques Navy Primary Electrophysiologist: Dr Johney Frame Referring Physician: Dr Albertina Parr Virginia Gilbert is a 79 y.o. female with a history of DM, HLD, HTN, and persistent atrial fibrillation who presents for follow up in the [facility]. The patient was initially diagnosed with atrial fibrillation 05/2019 after presenting with symptoms of worsening dyspnea on exertion. Patient is on Eliquis for a CHADS2VASC score of 5. There were no specific triggers that the patient could identify. She underwent DCCV on 07/08/19 but had ERAF. She was loaded on amiodarone and had DCCV again on 09/02/19 but unfortunately she was back in afib again. She denies snoring or alcohol use.  On follow up today, patient is s/p afib ablation with Dr Johney Frame on 12/02/19. Patient reports that she has done reasonably well since her last visit. She was hospitalized for acute diastolic CHF two days post ablation and was diuresed. She denies CP, swallowing, or groin issues. She remains in atypical atrial flutter today.   Today, she denies symptoms of palpitations, chest pain, shortness of breath, orthopnea, PND, dizziness, presyncope, syncope, snoring, daytime somnolence, bleeding, or neurologic sequela. The patient is tolerating medications without difficulties and is otherwise without complaint today.    Atrial Fibrillation Risk Factors:  she does not have symptoms or diagnosis of sleep apnea. she does not have a history of rheumatic fever. she does not have a history of alcohol use. The patient does not have a history of early familial atrial fibrillation or other arrhythmias.  she has a BMI of Body mass index is 31.63 kg/m.Marland Kitchen Filed Weights   01/05/20 0838  Weight: 71 kg    Family History  Problem Relation Age of Onset  . Heart disease Mother   . Diabetes Mother   . Emphysema Father   . Emphysema Brother   . Heart disease Brother    . Diabetes Brother   . Heart disease Brother   . Breast cancer Daughter   . Breast cancer Daughter      Atrial Fibrillation Management history:  Previous antiarrhythmic drugs: amiodarone Previous cardioversions: 07/08/19, 09/02/19 Previous ablations: 12/02/19 CHADS2VASC score: 5 Anticoagulation history: Eliquis   Past Medical History:  Diagnosis Date  . Diabetes mellitus without complication (HCC)   . Hyperlipidemia   . Hypertension   . Mitral regurgitation    evaluated by TEE 09/2019  . Persistent atrial fibrillation [facility])    Past Surgical History:  Procedure Laterality Date  . ATRIAL FIBRILLATION ABLATION N/A 12/02/2019   Procedure: ATRIAL FIBRILLATION ABLATION;  Surgeon: Hillis Range, MD;  Location: [facility] INVASIVE CV LAB;  Service: Cardiovascular;  Laterality: N/A;  . BUBBLE STUDY  10/08/2019   Procedure: BUBBLE STUDY;  Surgeon: [name], MD;  Location: [facility] ENDOSCOPY;  Service: Cardiology;;  . CARDIOVERSION N/A 07/08/2019   Procedure: CARDIOVERSION;  Surgeon: Yates Decamp, MD;  Location: [facility] ENDOSCOPY;  Service: Cardiovascular;  Laterality: N/A;  . CARDIOVERSION N/A 09/02/2019   Procedure: CARDIOVERSION;  Surgeon: Yates Decamp, MD;  Location: [facility] ENDOSCOPY;  Service: Cardiovascular;  Laterality: N/A;  . CATARACT EXTRACTION, BILATERAL    . PARTIAL HYSTERECTOMY     1977  . TEE WITHOUT CARDIOVERSION N/A 10/08/2019   Procedure: TRANSESOPHAGEAL ECHOCARDIOGRAM (TEE);  Surgeon: [name], MD;  Location: [facility] ENDOSCOPY;  Service: Cardiology;  Laterality: N/A;    Current Outpatient Medications  Medication Sig Dispense Refill  . acetaminophen (TYLENOL) 500 MG tablet Take 500 mg by mouth as  needed.    Marland Kitchen apixaban (ELIQUIS) 5 MG TABS tablet Take 1 tablet (5 mg total) by mouth 2 (two) times daily. 180 tablet 1  . carvedilol (COREG) 3.125 MG tablet Take 3.125 mg by mouth 2 (two) times daily.   12  . Cholecalciferol (VITAMIN D-3) 125 MCG (5000 UT) TABS Take 5,000 Units by mouth  daily after lunch.    . furosemide (LASIX) 20 MG tablet Take 1 tablet (20 mg total) by mouth 2 (two) times daily. 60 tablet 6  . glimepiride (AMARYL) 4 MG tablet Take 1 tablet (4 mg total) by mouth daily with breakfast.  1  . losartan (COZAAR) 100 MG tablet Take 100 mg by mouth daily.   6  . metFORMIN (GLUCOPHAGE) 1000 MG tablet Take 1,000 mg by mouth 2 (two) times daily.   6  . pantoprazole (PROTONIX) 40 MG tablet Take 1 tablet (40 mg total) by mouth daily. 45 tablet 0  . potassium chloride SA (KLOR-CON) 20 MEQ tablet Take 1 tablet (20 mEq total) by mouth daily. 30 tablet 6  . TRULICITY 1.5 IW/5.8KD SOPN Inject 1.5 mg into the skin every Friday.     Marland Kitchen amiodarone (PACERONE) 200 MG tablet Take 1 tablet (200 mg total) by mouth daily. 30 tablet 3   No current facility-administered medications for this encounter.    Allergies  Allergen Reactions  . Codeine Nausea And Vomiting  . Penicillins Anaphylaxis    Did it involve swelling of the face/tongue/throat, SOB, or low BP? Yes Did it involve sudden or severe rash/hives, skin peeling, or any reaction on the inside of your mouth or nose? Unknown Did you need to seek medical attention at a hospital or doctor's office? Yes--inpatient when reaction occurred When did it last happen?1964 or 1965 If all above answers are "NO", may proceed with cephalosporin use.   . Tramadol Itching    Social History   Socioeconomic History  . Marital status: Widowed    Spouse name: Not on file  . Number of children: 3  . Years of education: Not on file  . Highest education level: Not on file  Occupational History  . Not on file  Tobacco Use  . Smoking status: Former Smoker    Packs/day: 0.50    Years: 15.00    Pack years: 7.50    Types: Cigarettes    Quit date: 05/14/1985    Years since quitting: 34.6  . Smokeless tobacco: Never Used  Substance and Sexual Activity  . Alcohol use: No    Alcohol/week: 0.0 standard drinks  . Drug use: Never  .  Sexual activity: Not on file  Other Topics Concern  . Not on file  Social History Narrative  . Not on file   Social Determinants of Health   Financial Resource Strain:   . Difficulty of Paying Living Expenses:   Food Insecurity:   . Worried About Charity fundraiser in the Last Year:   . Arboriculturist in the Last Year:   Transportation Needs:   . Film/video editor (Medical):   Marland Kitchen Lack of Transportation (Non-Medical):   Physical Activity:   . Days of Exercise per Week:   . Minutes of Exercise per Session:   Stress:   . Feeling of Stress :   Social Connections:   . Frequency of Communication with Friends and Family:   . Frequency of Social Gatherings with Friends and Family:   . Attends Religious Services:   . Active  Member of Clubs or Organizations:   . Attends Banker Meetings:   Marland Kitchen Marital Status:   Intimate Partner Violence:   . Fear of Current or Ex-Partner:   . Emotionally Abused:   Marland Kitchen Physically Abused:   . Sexually Abused:      ROS- All systems are reviewed and negative except as per the HPI above.  Physical Exam: Vitals:   01/05/20 0838  BP: (!) 158/82  Pulse: 80  Weight: 71 kg  Height: 4\' 11"  (1.499 m)    GEN- The patient is well appearing obese elderly female, alert and oriented x 3 today.   HEENT-head normocephalic, atraumatic, sclera clear, conjunctiva pink, hearing intact, trachea midline. Lungs- Clear to ausculation bilaterally, normal work of breathing Heart- Regular rate and rhythm, no murmurs, rubs or gallops  GI- soft, NT, ND, + BS Extremities- no clubbing, cyanosis, or edema MS- no significant deformity or atrophy Skin- no rash or lesion Psych- euthymic mood, full affect Neuro- strength and sensation are intact   Wt Readings from Last 3 Encounters:  01/05/20 71 kg  12/31/19 72.5 kg  12/22/19 72.6 kg    EKG today demonstrates atypical atrial flutter HR 80, QRS 72, QTc 429  Echo 12/05/19 demonstrated  1. Patient  appears to be in atrial flutter throughout exam.  2. Left ventricular ejection fraction, by estimation, is 60 to 65%. The  left ventricle has normal function. The left ventricle has no regional  wall motion abnormalities. Left ventricular diastolic parameters are  indeterminate.  3. Right ventricular systolic function is normal. The right ventricular  size is normal. There is mildly elevated pulmonary artery systolic  pressure.  4. Left atrial size was moderately dilated.  5. The mitral valve is normal in structure. Mild mitral valve  regurgitation. No evidence of mitral stenosis.  6. The aortic valve is tricuspid. Aortic valve regurgitation is trivial.  Mild to moderate aortic valve sclerosis/calcification is present, without  any evidence of aortic stenosis.  7. The inferior vena cava is normal in size with greater than 50%  respiratory variability, suggesting right atrial pressure of 3 mmHg.   Epic records are reviewed at length today  CHA2DS2-VASc Score = 5 The patient's score is based upon: CHF History: No HTN History: Yes Age : 63 + Diabetes History: Yes Stroke History: No Vascular Disease History: No Gender: Female   ASSESSMENT AND PLAN: 1. Persistent Atrial Fibrillation/atrial flutter The patient's CHA2DS2-VASc score is 5, indicating a 7.2% annual risk of stroke.   S/p ablation 12/02/19 She remains in atypical atrial flutter today. Reassured patient that recurrence of atrial arrhythmias is common in the first 3 months post ablation. Will restart amiodarone 200 mg daily. After discussing the risks and benefits, will arrange for DCCV as it appears she has been persistent.  Check bmet/CBC Continue Eliquis 5 mg BID Continue coreg 3.125 mg BID  2. Secondary Hypercoagulable State (ICD10:  D68.69) The patient is at significant risk for stroke/thromboembolism based upon her CHA2DS2-VASc Score of 5.  Continue Apixaban (Eliquis).   3. Obesity Body mass index is 31.63  kg/m. Lifestyle modification was discussed and encouraged including regular physical activity and weight reduction.  4. HTN Stable, no changes today.  5. Chronic diastolic CHF Patient's weight is down ~4 lbs. No signs or symptoms of fluid overload today.  Repeat bmet as above.   Follow up with Dr 12/04/19 and Dr Jacques Navy as scheduled.    Johney Frame PA-C [facility] [address]  [address] [place], [place] [address] [phone] 01/05/2020 9:13 AM

## 2020-01-10 ENCOUNTER — Other Ambulatory Visit (HOSPITAL_COMMUNITY)
Admission: RE | Admit: 2020-01-10 | Discharge: 2020-01-10 | Disposition: A | Discharge status: Home / Self Care | Payer: Medicare Other | Source: Ambulatory Visit | Attending: Cardiovascular Disease | Admitting: Cardiovascular Disease

## 2020-01-10 DIAGNOSIS — Z01812 Encounter for preprocedural laboratory examination: Secondary | ICD-10-CM | POA: Insufficient documentation

## 2020-01-10 DIAGNOSIS — Z20822 Contact with and (suspected) exposure to covid-19: Secondary | ICD-10-CM | POA: Diagnosis not present

## 2020-01-10 LAB — SARS CORONAVIRUS 2 (TAT 6-24 HRS): SARS Coronavirus 2: NEGATIVE

## 2020-01-13 ENCOUNTER — Ambulatory Visit (HOSPITAL_COMMUNITY): Payer: Medicare Other | Admitting: Certified Registered"

## 2020-01-13 ENCOUNTER — Encounter (HOSPITAL_COMMUNITY): Payer: Self-pay | Admitting: Cardiovascular Disease

## 2020-01-13 ENCOUNTER — Ambulatory Visit: Payer: Medicare Other | Admitting: Internal Medicine

## 2020-01-13 ENCOUNTER — Encounter (HOSPITAL_COMMUNITY)
Admission: RE | Disposition: A | Discharge status: Home / Self Care | Payer: Self-pay | Source: Home / Self Care | Attending: Cardiovascular Disease

## 2020-01-13 ENCOUNTER — Other Ambulatory Visit: Payer: Self-pay

## 2020-01-13 ENCOUNTER — Ambulatory Visit (HOSPITAL_BASED_OUTPATIENT_CLINIC_OR_DEPARTMENT_OTHER)
Admission: RE | Admit: 2020-01-13 | Discharge: 2020-01-13 | Disposition: A | Discharge status: Home / Self Care | Payer: Medicare Other | Source: Home / Self Care | Attending: Cardiovascular Disease | Admitting: Cardiovascular Disease

## 2020-01-13 DIAGNOSIS — I4892 Unspecified atrial flutter: Secondary | ICD-10-CM | POA: Diagnosis not present

## 2020-01-13 DIAGNOSIS — Z87891 Personal history of nicotine dependence: Secondary | ICD-10-CM | POA: Insufficient documentation

## 2020-01-13 DIAGNOSIS — I484 Atypical atrial flutter: Secondary | ICD-10-CM | POA: Insufficient documentation

## 2020-01-13 DIAGNOSIS — E119 Type 2 diabetes mellitus without complications: Secondary | ICD-10-CM | POA: Insufficient documentation

## 2020-01-13 DIAGNOSIS — I480 Paroxysmal atrial fibrillation: Secondary | ICD-10-CM | POA: Diagnosis not present

## 2020-01-13 DIAGNOSIS — E669 Obesity, unspecified: Secondary | ICD-10-CM | POA: Insufficient documentation

## 2020-01-13 DIAGNOSIS — Z6831 Body mass index (BMI) 31.0-31.9, adult: Secondary | ICD-10-CM | POA: Insufficient documentation

## 2020-01-13 DIAGNOSIS — Z880 Allergy status to penicillin: Secondary | ICD-10-CM | POA: Insufficient documentation

## 2020-01-13 DIAGNOSIS — R0602 Shortness of breath: Secondary | ICD-10-CM | POA: Diagnosis not present

## 2020-01-13 DIAGNOSIS — I4819 Other persistent atrial fibrillation: Secondary | ICD-10-CM | POA: Insufficient documentation

## 2020-01-13 DIAGNOSIS — E785 Hyperlipidemia, unspecified: Secondary | ICD-10-CM | POA: Insufficient documentation

## 2020-01-13 DIAGNOSIS — I110 Hypertensive heart disease with heart failure: Secondary | ICD-10-CM | POA: Insufficient documentation

## 2020-01-13 DIAGNOSIS — R0609 Other forms of dyspnea: Secondary | ICD-10-CM | POA: Insufficient documentation

## 2020-01-13 DIAGNOSIS — Z794 Long term (current) use of insulin: Secondary | ICD-10-CM | POA: Insufficient documentation

## 2020-01-13 DIAGNOSIS — Z79899 Other long term (current) drug therapy: Secondary | ICD-10-CM | POA: Insufficient documentation

## 2020-01-13 DIAGNOSIS — I340 Nonrheumatic mitral (valve) insufficiency: Secondary | ICD-10-CM | POA: Insufficient documentation

## 2020-01-13 DIAGNOSIS — I5031 Acute diastolic (congestive) heart failure: Secondary | ICD-10-CM | POA: Diagnosis not present

## 2020-01-13 DIAGNOSIS — D6869 Other thrombophilia: Secondary | ICD-10-CM | POA: Insufficient documentation

## 2020-01-13 DIAGNOSIS — Z20822 Contact with and (suspected) exposure to covid-19: Secondary | ICD-10-CM | POA: Diagnosis not present

## 2020-01-13 DIAGNOSIS — I5032 Chronic diastolic (congestive) heart failure: Secondary | ICD-10-CM | POA: Insufficient documentation

## 2020-01-13 DIAGNOSIS — J9621 Acute and chronic respiratory failure with hypoxia: Secondary | ICD-10-CM | POA: Diagnosis not present

## 2020-01-13 DIAGNOSIS — Z7901 Long term (current) use of anticoagulants: Secondary | ICD-10-CM | POA: Insufficient documentation

## 2020-01-13 DIAGNOSIS — I5033 Acute on chronic diastolic (congestive) heart failure: Secondary | ICD-10-CM | POA: Diagnosis not present

## 2020-01-13 HISTORY — PX: CARDIOVERSION: SHX1299

## 2020-01-13 LAB — GLUCOSE, CAPILLARY: Glucose-Capillary: 107 mg/dL — ABNORMAL HIGH (ref 70–99)

## 2020-01-13 SURGERY — CARDIOVERSION
Anesthesia: General

## 2020-01-13 MED ORDER — LIDOCAINE 2% (20 MG/ML) 5 ML SYRINGE
INTRAMUSCULAR | Status: DC | PRN
  Administered 2020-01-13: 40 mg via INTRAVENOUS

## 2020-01-13 MED ORDER — PROPOFOL 10 MG/ML IV BOLUS
INTRAVENOUS | Status: DC | PRN
  Administered 2020-01-13: 70 mg via INTRAVENOUS

## 2020-01-13 MED ORDER — SODIUM CHLORIDE 0.9 % IV SOLN: INTRAVENOUS | Status: DC | PRN

## 2020-01-13 NOTE — Anesthesia Postprocedure Evaluation (Signed)
Anesthesia Post Note  Patient: Virginia Gilbert  Procedure(s) Performed: CARDIOVERSION (N/A )     Patient location during evaluation: PACU Anesthesia Type: General Level of consciousness: awake and alert and oriented Pain management: pain level controlled Vital Signs Assessment: post-procedure vital signs reviewed and stable Respiratory status: spontaneous breathing, nonlabored ventilation and respiratory function stable Cardiovascular status: blood pressure returned to baseline Postop Assessment: no apparent nausea or vomiting Anesthetic complications: no    Last Vitals:  Vitals:   01/13/20 0944 01/13/20 0945  BP:    Pulse: 61 (!) 59  Resp: 14 (!) 21  Temp:    SpO2: (!) 89% 91%    Last Pain:  Vitals:   01/13/20 0943  TempSrc: Axillary  PainSc: Asleep                 Kaylyn Layer

## 2020-01-13 NOTE — Anesthesia Preprocedure Evaluation (Addendum)
Anesthesia Evaluation  Patient identified by MRN, date of birth, ID band Patient awake    Reviewed: Allergy & Precautions, NPO status , Patient's Chart, lab work & pertinent test results, reviewed documented beta blocker date and time   History of Anesthesia Complications Negative for: history of anesthetic complications  Airway Mallampati: II  TM Distance: >3 FB Neck ROM: Full    Dental  (+) Lower Dentures, Upper Dentures   Pulmonary former smoker,    Pulmonary exam normal        Cardiovascular hypertension, Pt. on medications and Pt. on home beta blockers Normal cardiovascular exam+ dysrhythmias Atrial Fibrillation   TTE 11/2019: EF 60-65%, mildly elevated PASP,moderate LAE, mild MR    Neuro/Psych negative neurological ROS  negative psych ROS   GI/Hepatic Neg liver ROS, GERD  Medicated and Controlled,  Endo/Other  diabetes, Type 2, Oral Hypoglycemic Agents  Renal/GU negative Renal ROS  negative genitourinary   Musculoskeletal negative musculoskeletal ROS (+)   Abdominal   Peds  Hematology  (+) anemia , Hgb 10.1   Anesthesia Other Findings Day of surgery medications reviewed with patient.  Reproductive/Obstetrics negative OB ROS                           Anesthesia Physical Anesthesia Plan  ASA: III  Anesthesia Plan: General   Post-op Pain Management:    Induction: Intravenous  PONV Risk Score and Plan: 3 and Treatment may vary due to age or medical condition and Propofol infusion  Airway Management Planned: Mask  Additional Equipment: None  Intra-op Plan:   Post-operative Plan:   Informed Consent: I have reviewed the patients History and Physical, chart, labs and discussed the procedure including the risks, benefits and alternatives for the proposed anesthesia with the patient or authorized representative who has indicated his/her understanding and acceptance.        Plan Discussed with: CRNA  Anesthesia Plan Comments:       Anesthesia Quick Evaluation

## 2020-01-13 NOTE — Discharge Instructions (Signed)
Electrical Cardioversion   What can I expect after the procedure?  Your blood pressure, heart rate, breathing rate, and blood oxygen level will be monitored until you leave the hospital or clinic.  Your heart rhythm will be watched to make sure it does not change.  You may have some redness on the skin where the shocks were given. If skin is irritated you can use hydrocortisone cream/ aloe vera.   Follow these instructions at home:  Do not drive for 24 hours if you were given a sedative during your procedure.  Take over-the-counter and prescription medicines only as told by your health care provider.  Ask your health care provider how to check your pulse. Check it often.  Rest for 48 hours after the procedure or as told by your health care provider.  Avoid or limit your caffeine use as told by your health care provider.  Keep all follow-up visits as told by your health care provider. This is important.  Contact a health care provider if:  You feel like your heart is beating too quickly or your pulse is not regular.  You have a serious muscle cramp that does not go away.  Get help right away if:  You have discomfort in your chest.  You are dizzy or you feel faint.  You have trouble breathing or you are short of breath.  Your speech is slurred.  You have trouble moving an arm or leg on one side of your body.  Your fingers or toes turn cold or blue.  Summary  Electrical cardioversion is the delivery of a jolt of electricity to restore a normal rhythm to the heart.  This procedure may be done right away in an emergency or may be a scheduled procedure if the condition is not an emergency.  Generally, this is a safe procedure.  After the procedure, check your pulse often as told by your health care provider.  This information is not intended to replace advice given to you by your health care provider. Make sure you discuss any questions you have with your health care  provider. Document Revised: 03/03/2019 Document Reviewed: 03/03/2019 Elsevier Patient Education  2020 [employer].

## 2020-01-13 NOTE — Transfer of Care (Signed)
Immediate Anesthesia Transfer of Care Note  Patient: Virginia Gilbert  Procedure(s) Performed: CARDIOVERSION (N/A )  Patient Location: Endoscopy Unit  Anesthesia Type:General  Level of Consciousness: awake, alert  and oriented  Airway & Oxygen Therapy: Patient Spontanous Breathing  Post-op Assessment: Report given to RN and Post -op Vital signs reviewed and stable  Post vital signs: Reviewed and stable  Last Vitals:  Vitals Value Taken Time  BP 136/61 01/13/20 0943  Temp 36.9 C 01/13/20 0943  Pulse 59 01/13/20 0945  Resp 21 01/13/20 0945  SpO2 91 % 01/13/20 0945    Last Pain:  Vitals:   01/13/20 0943  TempSrc: Axillary  PainSc: Asleep         Complications: No apparent anesthesia complications

## 2020-01-13 NOTE — CV Procedure (Signed)
° °  DIRECT CURRENT CARDIOVERSION  NAME:  Virginia Gilbert    MRN: [id number] DOB:  03-14-1941    ADMIT DATE: 01/13/2020  Indication:  Symptomatic atrial flutter  Procedure Note:  The patient signed informed consent.  They have had had therapeutic anticoagulation with eliquis greater than 3 weeks.  Anesthesia was administered by Dr. Stephannie Peters.  Adequate airway was maintained throughout and vital followed per protocol.  They were cardioverted x 1 with 200J of biphasic synchronized energy.  They converted to NSR.  There were no apparent complications.  The patient had normal neuro status and respiratory status post procedure with vitals stable as recorded elsewhere.    Follow up: They will continue on current medical therapy and follow up with cardiology as scheduled.  Gerri Spore T. Flora Lipps, MD [facility]  [address], [address] [place], [place] [address] [phone]  9:45 AM

## 2020-01-13 NOTE — Anesthesia Procedure Notes (Signed)
Procedure Name: General with mask airway Performed by: Macie Burows, CRNA Pre-anesthesia Checklist: Patient identified, Emergency Drugs available, Suction available and Patient being monitored Patient Re-evaluated:Patient Re-evaluated prior to induction Oxygen Delivery Method: Ambu bag Induction Type: IV induction Ventilation: Mask ventilation without difficulty Placement Confirmation: positive ETCO2 and breath sounds checked- equal and bilateral Dental Injury: Teeth and Oropharynx as per pre-operative assessment

## 2020-01-13 NOTE — Interval H&P Note (Signed)
History and Physical Interval Note:  01/13/2020 9:01 AM  Virginia Gilbert  has presented today for surgery, with the diagnosis of AFIB.  The various methods of treatment have been discussed with the patient and family. After consideration of risks, benefits and other options for treatment, the patient has consented to  Procedure(s): CARDIOVERSION (N/A) as a surgical intervention.  The patient's history has been reviewed, patient examined, no change in status, stable for surgery.  I have reviewed the patient's chart and labs.  Questions were answered to the patient's satisfaction.     DCCV for Afib. NPO. On eliquis for 3 weeks and no missed doses.   Gerri Spore T. Flora Lipps, MD [facility]  [address] [place], [place] [address] [phone]  9:02 AM

## 2020-01-14 ENCOUNTER — Emergency Department (HOSPITAL_COMMUNITY): Payer: Medicare Other

## 2020-01-14 ENCOUNTER — Inpatient Hospital Stay (HOSPITAL_COMMUNITY)
Admission: EM | Admit: 2020-01-14 | Discharge: 2020-01-16 | DRG: 291 | Disposition: A | Discharge status: Home / Self Care | Payer: Medicare Other | Attending: Internal Medicine | Admitting: Internal Medicine

## 2020-01-14 ENCOUNTER — Telehealth: Payer: Self-pay | Admitting: Internal Medicine

## 2020-01-14 DIAGNOSIS — I4891 Unspecified atrial fibrillation: Secondary | ICD-10-CM | POA: Diagnosis not present

## 2020-01-14 DIAGNOSIS — I509 Heart failure, unspecified: Secondary | ICD-10-CM

## 2020-01-14 DIAGNOSIS — D72829 Elevated white blood cell count, unspecified: Secondary | ICD-10-CM | POA: Diagnosis not present

## 2020-01-14 DIAGNOSIS — Z90711 Acquired absence of uterus with remaining cervical stump: Secondary | ICD-10-CM | POA: Diagnosis not present

## 2020-01-14 DIAGNOSIS — I340 Nonrheumatic mitral (valve) insufficiency: Secondary | ICD-10-CM | POA: Diagnosis present

## 2020-01-14 DIAGNOSIS — E785 Hyperlipidemia, unspecified: Secondary | ICD-10-CM | POA: Diagnosis not present

## 2020-01-14 DIAGNOSIS — R112 Nausea with vomiting, unspecified: Secondary | ICD-10-CM | POA: Diagnosis not present

## 2020-01-14 DIAGNOSIS — Z79899 Other long term (current) drug therapy: Secondary | ICD-10-CM

## 2020-01-14 DIAGNOSIS — D638 Anemia in other chronic diseases classified elsewhere: Secondary | ICD-10-CM | POA: Diagnosis present

## 2020-01-14 DIAGNOSIS — I110 Hypertensive heart disease with heart failure: Principal | ICD-10-CM | POA: Diagnosis present

## 2020-01-14 DIAGNOSIS — Z825 Family history of asthma and other chronic lower respiratory diseases: Secondary | ICD-10-CM

## 2020-01-14 DIAGNOSIS — Z7901 Long term (current) use of anticoagulants: Secondary | ICD-10-CM

## 2020-01-14 DIAGNOSIS — E1165 Type 2 diabetes mellitus with hyperglycemia: Secondary | ICD-10-CM | POA: Diagnosis not present

## 2020-01-14 DIAGNOSIS — J9601 Acute respiratory failure with hypoxia: Secondary | ICD-10-CM

## 2020-01-14 DIAGNOSIS — Z885 Allergy status to narcotic agent status: Secondary | ICD-10-CM

## 2020-01-14 DIAGNOSIS — I4892 Unspecified atrial flutter: Secondary | ICD-10-CM | POA: Diagnosis not present

## 2020-01-14 DIAGNOSIS — J189 Pneumonia, unspecified organism: Secondary | ICD-10-CM | POA: Diagnosis present

## 2020-01-14 DIAGNOSIS — R0902 Hypoxemia: Secondary | ICD-10-CM

## 2020-01-14 DIAGNOSIS — Z20822 Contact with and (suspected) exposure to covid-19: Secondary | ICD-10-CM | POA: Diagnosis present

## 2020-01-14 DIAGNOSIS — D6869 Other thrombophilia: Secondary | ICD-10-CM | POA: Diagnosis present

## 2020-01-14 DIAGNOSIS — K5790 Diverticulosis of intestine, part unspecified, without perforation or abscess without bleeding: Secondary | ICD-10-CM | POA: Diagnosis not present

## 2020-01-14 DIAGNOSIS — J9600 Acute respiratory failure, unspecified whether with hypoxia or hypercapnia: Secondary | ICD-10-CM | POA: Diagnosis present

## 2020-01-14 DIAGNOSIS — E669 Obesity, unspecified: Secondary | ICD-10-CM | POA: Diagnosis present

## 2020-01-14 DIAGNOSIS — I10 Essential (primary) hypertension: Secondary | ICD-10-CM | POA: Diagnosis present

## 2020-01-14 DIAGNOSIS — R778 Other specified abnormalities of plasma proteins: Secondary | ICD-10-CM | POA: Diagnosis present

## 2020-01-14 DIAGNOSIS — J9621 Acute and chronic respiratory failure with hypoxia: Secondary | ICD-10-CM | POA: Diagnosis not present

## 2020-01-14 DIAGNOSIS — I5033 Acute on chronic diastolic (congestive) heart failure: Secondary | ICD-10-CM | POA: Diagnosis not present

## 2020-01-14 DIAGNOSIS — E876 Hypokalemia: Secondary | ICD-10-CM | POA: Diagnosis not present

## 2020-01-14 DIAGNOSIS — Z8249 Family history of ischemic heart disease and other diseases of the circulatory system: Secondary | ICD-10-CM

## 2020-01-14 DIAGNOSIS — Z7984 Long term (current) use of oral hypoglycemic drugs: Secondary | ICD-10-CM | POA: Diagnosis not present

## 2020-01-14 DIAGNOSIS — R0602 Shortness of breath: Secondary | ICD-10-CM | POA: Diagnosis not present

## 2020-01-14 DIAGNOSIS — R0989 Other specified symptoms and signs involving the circulatory and respiratory systems: Secondary | ICD-10-CM | POA: Diagnosis not present

## 2020-01-14 DIAGNOSIS — J9811 Atelectasis: Secondary | ICD-10-CM | POA: Diagnosis present

## 2020-01-14 DIAGNOSIS — Z6830 Body mass index (BMI) 30.0-30.9, adult: Secondary | ICD-10-CM

## 2020-01-14 DIAGNOSIS — Z880 Allergy status to penicillin: Secondary | ICD-10-CM

## 2020-01-14 DIAGNOSIS — J90 Pleural effusion, not elsewhere classified: Secondary | ICD-10-CM

## 2020-01-14 DIAGNOSIS — Z743 Need for continuous supervision: Secondary | ICD-10-CM | POA: Diagnosis not present

## 2020-01-14 DIAGNOSIS — R0689 Other abnormalities of breathing: Secondary | ICD-10-CM | POA: Diagnosis not present

## 2020-01-14 DIAGNOSIS — Z87891 Personal history of nicotine dependence: Secondary | ICD-10-CM

## 2020-01-14 DIAGNOSIS — I480 Paroxysmal atrial fibrillation: Secondary | ICD-10-CM | POA: Diagnosis not present

## 2020-01-14 DIAGNOSIS — R7989 Other specified abnormal findings of blood chemistry: Secondary | ICD-10-CM | POA: Diagnosis present

## 2020-01-14 DIAGNOSIS — Z833 Family history of diabetes mellitus: Secondary | ICD-10-CM

## 2020-01-14 DIAGNOSIS — R109 Unspecified abdominal pain: Secondary | ICD-10-CM | POA: Diagnosis not present

## 2020-01-14 LAB — CBC
HCT: 35.4 % — ABNORMAL LOW (ref 36.0–46.0)
Hemoglobin: 10.1 g/dL — ABNORMAL LOW (ref 12.0–15.0)
MCH: 26.4 pg (ref 26.0–34.0)
MCHC: 28.5 g/dL — ABNORMAL LOW (ref 30.0–36.0)
MCV: 92.7 fL (ref 80.0–100.0)
Platelets: 277 10*3/uL (ref 150–400)
RBC: 3.82 MIL/uL — ABNORMAL LOW (ref 3.87–5.11)
RDW: 15.5 % (ref 11.5–15.5)
WBC: 10.8 10*3/uL — ABNORMAL HIGH (ref 4.0–10.5)
nRBC: 0 % (ref 0.0–0.2)

## 2020-01-14 LAB — COMPREHENSIVE METABOLIC PANEL
ALT: 34 U/L (ref 0–44)
AST: 35 U/L (ref 15–41)
Albumin: 3.3 g/dL — ABNORMAL LOW (ref 3.5–5.0)
Alkaline Phosphatase: 57 U/L (ref 38–126)
Anion gap: 13 (ref 5–15)
BUN: 21 mg/dL (ref 8–23)
CO2: 25 mmol/L (ref 22–32)
Calcium: 8.4 mg/dL — ABNORMAL LOW (ref 8.9–10.3)
Chloride: 99 mmol/L (ref 98–111)
Creatinine, Ser: 0.85 mg/dL (ref 0.44–1.00)
GFR calc Af Amer: >60 mL/min (ref >60)
GFR calc non Af Amer: >60 mL/min (ref >60)
Glucose, Bld: 176 mg/dL — ABNORMAL HIGH (ref 70–99)
Potassium: 3.8 mmol/L (ref 3.5–5.1)
Sodium: 137 mmol/L (ref 135–145)
Total Bilirubin: 0.8 mg/dL (ref 0.3–1.2)
Total Protein: 6.3 g/dL — ABNORMAL LOW (ref 6.5–8.1)

## 2020-01-14 LAB — GLUCOSE, CAPILLARY
Glucose-Capillary: 93 mg/dL (ref 70–99)
Glucose-Capillary: 107 mg/dL — ABNORMAL HIGH (ref 70–99)

## 2020-01-14 LAB — LIPASE, BLOOD: Lipase: 25 U/L (ref 11–51)

## 2020-01-14 LAB — CBG MONITORING, ED: Glucose-Capillary: 102 mg/dL — ABNORMAL HIGH (ref 70–99)

## 2020-01-14 LAB — BRAIN NATRIURETIC PEPTIDE: B Natriuretic Peptide: 98.4 pg/mL (ref 0.0–100.0)

## 2020-01-14 LAB — TROPONIN I (HIGH SENSITIVITY)
Troponin I (High Sensitivity): 16 ng/L (ref <18)
Troponin I (High Sensitivity): 18 ng/L — ABNORMAL HIGH (ref <18)

## 2020-01-14 LAB — SARS CORONAVIRUS 2 BY RT PCR (HOSPITAL ORDER, PERFORMED IN ~~LOC~~ HOSPITAL LAB): SARS Coronavirus 2: NEGATIVE

## 2020-01-14 LAB — STREP PNEUMONIAE URINARY ANTIGEN: Strep Pneumo Urinary Antigen: NEGATIVE

## 2020-01-14 LAB — D-DIMER, QUANTITATIVE: D-Dimer, Quant: 0.54 ug/mL-FEU — ABNORMAL HIGH (ref 0.00–0.50)

## 2020-01-14 MED ORDER — PANTOPRAZOLE SODIUM 40 MG PO TBEC
40.0000 mg | DELAYED_RELEASE_TABLET | Freq: Every day | ORAL | Status: DC
  Administered 2020-01-15 – 2020-01-16 (×2): 40 mg via ORAL
  Filled 2020-01-14 (×2): qty 1

## 2020-01-14 MED ORDER — SODIUM CHLORIDE 0.9% FLUSH: 3.0000 mL | INTRAVENOUS | Status: DC | PRN

## 2020-01-14 MED ORDER — ACETAMINOPHEN 325 MG PO TABS
650.0000 mg | ORAL_TABLET | ORAL | Status: DC | PRN
  Administered 2020-01-14 – 2020-01-16 (×3): 650 mg via ORAL
  Filled 2020-01-14 (×3): qty 2

## 2020-01-14 MED ORDER — INSULIN ASPART 100 UNIT/ML ~~LOC~~ SOLN
0.0000 [IU] | Freq: Every day | SUBCUTANEOUS | Status: DC
  Administered 2020-01-15: 3 [IU] via SUBCUTANEOUS

## 2020-01-14 MED ORDER — CARVEDILOL 3.125 MG PO TABS
3.1250 mg | ORAL_TABLET | Freq: Two times a day (BID) | ORAL | Status: DC
  Administered 2020-01-14 – 2020-01-15 (×3): 3.125 mg via ORAL
  Filled 2020-01-14 (×5): qty 1

## 2020-01-14 MED ORDER — INSULIN ASPART 100 UNIT/ML ~~LOC~~ SOLN
0.0000 [IU] | Freq: Three times a day (TID) | SUBCUTANEOUS | Status: DC
  Administered 2020-01-15: 1 [IU] via SUBCUTANEOUS
  Administered 2020-01-15 – 2020-01-16 (×2): 2 [IU] via SUBCUTANEOUS

## 2020-01-14 MED ORDER — LOSARTAN POTASSIUM 50 MG PO TABS: 100.0000 mg | ORAL_TABLET | Freq: Every day | ORAL | Status: DC

## 2020-01-14 MED ORDER — SODIUM CHLORIDE 0.9 % IV SOLN: 250.0000 mL | INTRAVENOUS | Status: DC | PRN

## 2020-01-14 MED ORDER — SODIUM CHLORIDE 0.9% FLUSH
3.0000 mL | Freq: Two times a day (BID) | INTRAVENOUS | Status: DC
  Administered 2020-01-14 – 2020-01-16 (×4): 3 mL via INTRAVENOUS

## 2020-01-14 MED ORDER — POTASSIUM CHLORIDE CRYS ER 20 MEQ PO TBCR
20.0000 meq | EXTENDED_RELEASE_TABLET | Freq: Every day | ORAL | Status: DC
  Administered 2020-01-15: 20 meq via ORAL
  Filled 2020-01-14: qty 1

## 2020-01-14 MED ORDER — SODIUM CHLORIDE 0.9 % IV SOLN
2.0000 g | INTRAVENOUS | Status: DC
  Filled 2020-01-14: qty 20

## 2020-01-14 MED ORDER — FUROSEMIDE 10 MG/ML IJ SOLN
40.0000 mg | Freq: Two times a day (BID) | INTRAMUSCULAR | Status: DC
  Administered 2020-01-15 – 2020-01-16 (×3): 40 mg via INTRAVENOUS
  Filled 2020-01-14 (×4): qty 4

## 2020-01-14 MED ORDER — ONDANSETRON HCL 4 MG/2ML IJ SOLN
4.0000 mg | Freq: Four times a day (QID) | INTRAMUSCULAR | Status: DC | PRN
  Administered 2020-01-14: 4 mg via INTRAVENOUS
  Filled 2020-01-14: qty 2

## 2020-01-14 MED ORDER — SODIUM CHLORIDE 0.9 % IV SOLN: 500.0000 mg | INTRAVENOUS | Status: DC

## 2020-01-14 MED ORDER — FUROSEMIDE 10 MG/ML IJ SOLN
40.0000 mg | Freq: Once | INTRAMUSCULAR | Status: AC
  Administered 2020-01-14: 40 mg via INTRAVENOUS
  Filled 2020-01-14: qty 4

## 2020-01-14 MED ORDER — INSULIN ASPART 100 UNIT/ML ~~LOC~~ SOLN: 0.0000 [IU] | Freq: Three times a day (TID) | SUBCUTANEOUS | Status: DC

## 2020-01-14 MED ORDER — LEVOFLOXACIN IN D5W 750 MG/150ML IV SOLN
750.0000 mg | INTRAVENOUS | Status: DC
  Administered 2020-01-14: 750 mg via INTRAVENOUS
  Filled 2020-01-14: qty 150

## 2020-01-14 MED ORDER — GUAIFENESIN ER 600 MG PO TB12
600.0000 mg | ORAL_TABLET | Freq: Two times a day (BID) | ORAL | Status: DC
  Administered 2020-01-14 – 2020-01-16 (×4): 600 mg via ORAL
  Filled 2020-01-14 (×4): qty 1

## 2020-01-14 MED ORDER — AMIODARONE HCL 200 MG PO TABS
200.0000 mg | ORAL_TABLET | Freq: Every day | ORAL | Status: DC
  Administered 2020-01-15 – 2020-01-16 (×2): 200 mg via ORAL
  Filled 2020-01-14 (×2): qty 1

## 2020-01-14 MED ORDER — APIXABAN 5 MG PO TABS
5.0000 mg | ORAL_TABLET | Freq: Two times a day (BID) | ORAL | Status: DC
  Administered 2020-01-14 – 2020-01-16 (×4): 5 mg via ORAL
  Filled 2020-01-14 (×5): qty 1

## 2020-01-14 MED ORDER — IOHEXOL 300 MG/ML  SOLN
100.0000 mL | Freq: Once | INTRAMUSCULAR | Status: AC | PRN
  Administered 2020-01-14: 100 mL via INTRAVENOUS

## 2020-01-14 NOTE — ED Provider Notes (Signed)
[facility] EMERGENCY DEPARTMENT Provider Note   CSN: [id number] Arrival date & time: 01/14/20  8850     History Chief Complaint  Patient presents with  . Abdominal Pain    Virginia Gilbert is a 79 y.o. female.  Patient is a 79 year old female with past medical history of diabetes, hypertension, heart failure, atrial fibrillation on Xarelto presenting to the emergency department for shortness of breath and epigastric pain.  Patient reports that she woke up with a coughing fit this morning which seemed to get worse.  Also reports nausea and vomiting with a pressure in her epigastric region.  She denies any fever, chills, diarrhea, dysuria.  Has not tried anything for relief.  She was apparently satting in the 70s on room air on arrival per EMS.  She is not on home oxygen regularly.        Past Medical History:  Diagnosis Date  . Diabetes mellitus without complication (HCC)   . Hyperlipidemia   . Hypertension   . Mitral regurgitation    evaluated by TEE 09/2019  . Persistent atrial fibrillation [facility])     Patient Active Problem List   Diagnosis Date Noted  . Acute diastolic heart failure (HCC)   . Atypical atrial flutter (HCC)   . Community acquired pneumonia 12/04/2019  . Pneumonia 12/04/2019  . Acute respiratory failure with hypoxia (HCC)   . Nonrheumatic mitral valve regurgitation   . Secondary hypercoagulable state (HCC) 09/22/2019  . HTN (hypertension) 07/24/2019  . Atrial fibrillation (HCC) 07/24/2019    Past Surgical History:  Procedure Laterality Date  . ATRIAL FIBRILLATION ABLATION N/A 12/02/2019   Procedure: ATRIAL FIBRILLATION ABLATION;  Surgeon: Hillis Range, MD;  Location: [facility] INVASIVE CV LAB;  Service: Cardiovascular;  Laterality: N/A;  . BUBBLE STUDY  10/08/2019   Procedure: BUBBLE STUDY;  Surgeon: [name], MD;  Location: [facility] ENDOSCOPY;  Service: Cardiology;;  . CARDIOVERSION N/A 07/08/2019   Procedure: CARDIOVERSION;  Surgeon:  Yates Decamp, MD;  Location: [facility] ENDOSCOPY;  Service: Cardiovascular;  Laterality: N/A;  . CARDIOVERSION N/A 09/02/2019   Procedure: CARDIOVERSION;  Surgeon: Yates Decamp, MD;  Location: [facility] ENDOSCOPY;  Service: Cardiovascular;  Laterality: N/A;  . CARDIOVERSION N/A 01/13/2020   Procedure: CARDIOVERSION;  Surgeon: Sande Rives, MD;  Location: [facility] ENDOSCOPY;  Service: Cardiovascular;  Laterality: N/A;  . CATARACT EXTRACTION, BILATERAL    . PARTIAL HYSTERECTOMY     1975  . TEE WITHOUT CARDIOVERSION N/A 10/08/2019   Procedure: TRANSESOPHAGEAL ECHOCARDIOGRAM (TEE);  Surgeon: [name], MD;  Location: [facility] ENDOSCOPY;  Service: Cardiology;  Laterality: N/A;     OB History   No obstetric history on file.     Family History  Problem Relation Age of Onset  . Heart disease Mother   . Diabetes Mother   . Emphysema Father   . Emphysema Brother   . Heart disease Brother   . Diabetes Brother   . Heart disease Brother   . Breast cancer Daughter   . Breast cancer Daughter     Social History   Tobacco Use  . Smoking status: Former Smoker    Packs/day: 0.50    Years: 15.00    Pack years: 7.50    Types: Cigarettes    Quit date: 05/14/1985    Years since quitting: 34.6  . Smokeless tobacco: Never Used  Substance Use Topics  . Alcohol use: No    Alcohol/week: 0.0 standard drinks  . Drug use: Never  Home Medications Prior to Admission medications   Medication Sig Start Date End Date Taking? Authorizing Provider  acetaminophen (TYLENOL) 500 MG tablet Take 500 mg by mouth every 8 (eight) hours as needed for moderate pain or headache.    Yes [provider]  amiodarone (PACERONE) 200 MG tablet Take 1 tablet (200 mg total) by mouth daily. 01/05/20  Yes Fenton, Clint R, PA  apixaban (ELIQUIS) 5 MG TABS tablet Take 1 tablet (5 mg total) by mouth 2 (two) times daily. 10/21/19  Yes [name], MD  carvedilol (COREG) 3.125 MG tablet Take 3.125 mg by mouth 2 (two) times  daily.  04/18/18  Yes [provider]  Cholecalciferol (VITAMIN D-3) 125 MCG (5000 UT) TABS Take 5,000 Units by mouth daily with lunch.    Yes [provider]  Cyanocobalamin (B-12) 2500 MCG TABS Take 2,500 mcg by mouth daily with lunch.    Yes [provider]  furosemide (LASIX) 20 MG tablet Take 1 tablet (20 mg total) by mouth 2 (two) times daily. 12/31/19  Yes [name], MD  glimepiride (AMARYL) 4 MG tablet Take 1 tablet (4 mg total) by mouth daily with breakfast. 12/07/19  Yes Dahal, Melina Schools, MD  losartan (COZAAR) 100 MG tablet Take 100 mg by mouth daily.  03/06/15  Yes [provider]  metFORMIN (GLUCOPHAGE) 1000 MG tablet Take 1,000 mg by mouth 2 (two) times daily with a meal.  02/26/15  Yes [provider]  pantoprazole (PROTONIX) 40 MG tablet Take 1 tablet (40 mg total) by mouth daily. Patient taking differently: Take 40 mg by mouth daily before breakfast.  12/02/19 01/16/20 Yes Allred, Fayrene Fearing, MD  potassium chloride SA (KLOR-CON) 20 MEQ tablet Take 1 tablet (20 mEq total) by mouth daily. Patient taking differently: Take 20 mEq by mouth daily with lunch.  12/10/19  Yes [name], MD  TRULICITY 1.5 MG/0.5ML SOPN Inject 1.5 mg into the skin every Friday.  05/29/18  Yes [provider]    Allergies    Penicillins, Tramadol, and Codeine  Review of Systems   Review of Systems  Constitutional: Negative for appetite change, chills and fever.  HENT: Negative for congestion and rhinorrhea.   Respiratory: Positive for cough and shortness of breath.   Cardiovascular: Negative for chest pain.  Gastrointestinal: Positive for abdominal pain, nausea and vomiting.  Genitourinary: Negative for dysuria.  Musculoskeletal: Negative for back pain.  Skin: Negative for rash.  Neurological: Negative for dizziness and syncope.  All other systems reviewed and are negative.   Physical Exam Updated Vital Signs BP (!) 172/80   Pulse 75   Temp  98.5 F (36.9 C) (Oral)   Resp (!) 24   Ht 4\' 11"  (1.499 m)   Wt 69.4 kg   SpO2 94%   BMI 30.90 kg/m   Physical Exam Vitals and nursing note reviewed.  Constitutional:      General: She is not in acute distress.    Appearance: Normal appearance. She is well-developed. She is not ill-appearing, toxic-appearing or diaphoretic.  HENT:     Head: Normocephalic.     Mouth/Throat:     Mouth: Mucous membranes are moist.  Eyes:     Conjunctiva/sclera: Conjunctivae normal.  Cardiovascular:     Rate and Rhythm: Normal rate and regular rhythm.  Pulmonary:     Effort: Pulmonary effort is normal.  Abdominal:     General: Abdomen is flat. Bowel sounds are normal.     Palpations: Abdomen  is soft.     Tenderness: There is no abdominal tenderness.  Musculoskeletal:     Right lower leg: No edema.     Left lower leg: No edema.  Skin:    General: Skin is dry.  Neurological:     General: No focal deficit present.     Mental Status: She is alert and oriented to person, place, and time.  Psychiatric:        Mood and Affect: Mood normal.     ED Results / Procedures / Treatments   Labs (all labs ordered are listed, but only abnormal results are displayed) Labs Reviewed  CBC - Abnormal; Notable for the following components:      Result Value   WBC 10.8 (*)    RBC 3.82 (*)    Hemoglobin 10.1 (*)    HCT 35.4 (*)    MCHC 28.5 (*)    All other components within normal limits  COMPREHENSIVE METABOLIC PANEL - Abnormal; Notable for the following components:   Glucose, Bld 176 (*)    Calcium 8.4 (*)    Total Protein 6.3 (*)    Albumin 3.3 (*)    All other components within normal limits  D-DIMER, QUANTITATIVE (NOT AT [facility]) - Abnormal; Notable for the following components:   D-Dimer, Quant 0.54 (*)    All other components within normal limits  CBG MONITORING, ED - Abnormal; Notable for the following components:   Glucose-Capillary 102 (*)    All other components within normal limits    TROPONIN I (HIGH SENSITIVITY) - Abnormal; Notable for the following components:   Troponin I (High Sensitivity) 18 (*)    All other components within normal limits  SARS CORONAVIRUS 2 BY RT PCR (HOSPITAL ORDER, PERFORMED IN [facility])  LIPASE, BLOOD  BRAIN NATRIURETIC PEPTIDE  TROPONIN I (HIGH SENSITIVITY)    EKG None  Radiology CT ABDOMEN PELVIS W CONTRAST  Result Date: 01/14/2020 CLINICAL DATA:  Abdominal pain, recent cardioversion, new onset shortness of breath, diffuse abdominal pain EXAM: CT ABDOMEN AND PELVIS WITH CONTRAST TECHNIQUE: Multidetector CT imaging of the abdomen and pelvis was performed using the standard protocol following bolus administration of intravenous contrast. CONTRAST:  OMNIPAQUE IOHEXOL 300 MG/ML  SOLN COMPARISON:  CT chest, 12/04/2019, CT abdomen pelvis, 11/07/2012 FINDINGS: Lower chest: Moderate bilateral pleural effusions and associated atelectasis or consolidation. Hepatobiliary: No solid liver abnormality is seen. No gallstones, gallbladder wall thickening, or biliary dilatation. Pancreas: Unremarkable. No pancreatic ductal dilatation or surrounding inflammatory changes. Spleen: Normal in size without significant abnormality. Adrenals/Urinary Tract: Small, benign fat containing left adrenal myelolipoma. Tiny nonobstructive calculus of the posterior midportion of the right kidney. Numerous bilateral parapelvic cysts. Bladder is unremarkable. Stomach/Bowel: Stomach is within normal limits. Appendix appears normal. No evidence of bowel wall thickening, distention, or inflammatory changes. Descending and sigmoid diverticulosis. Vascular/Lymphatic: Aortic atherosclerosis. No enlarged abdominal or pelvic lymph nodes. Reproductive: Status post hysterectomy Other: No abdominal wall hernia or abnormality. No abdominopelvic ascites. Musculoskeletal: No acute or significant osseous findings. IMPRESSION: 1. No acute CT findings of the abdomen or pelvis to  explain abdominal pain. 2. Moderate bilateral pleural effusions and associated atelectasis or consolidation. 3. Tiny nonobstructive right renal calculus.  No hydronephrosis. 4. Descending and sigmoid diverticulosis without evidence of diverticulitis. 5. Aortic Atherosclerosis (ICD10-I70.0). Electronically Signed   By: Lauralyn Primes M.D.   On: 01/14/2020 13:36   DG Chest Port 1 View  Result Date: 01/14/2020 CLINICAL DATA:  Shortness of breath.  Hypoxia.  Abdominal pain. EXAM: PORTABLE CHEST 1 VIEW COMPARISON:  12/05/2019 and 03/31/2019 FINDINGS: The heart size and pulmonary vascularity. Slight diffuse accentuation of the interstitial markings without discrete infiltrates or effusions. The interstitial accentuation is likely due to a shallow inspiration but could represent slight. No bone abnormality. IMPRESSION: Slight diffuse accentuation of the interstitial markings as described above. Electronically Signed   By: Lorriane Shire M.D.   On: 01/14/2020 10:55    Procedures Procedures (including critical care time)  Medications Ordered in ED Medications  levofloxacin (LEVAQUIN) IVPB 750 mg (has no administration in time range)  furosemide (LASIX) injection 40 mg (has no administration in time range)  iohexol (OMNIPAQUE) 300 MG/ML solution 100 mL (100 mLs Intravenous Contrast Given 01/14/20 1250)    ED Course  I have reviewed the triage vital signs and the nursing notes.  Pertinent labs & imaging results that were available during my care of the patient were reviewed by me and considered in my medical decision making (see chart for details).  Clinical Course as of Jan 14 1535  Wed Jan 14, 2020  1418 Patient is a 79 year old with CHF presenting to the emergency department for shortness of breath and epigastric pain.  Patient was in the 70s on room air on arrival, no hx of needing home oxygen.  This is in the context of recently decreasing her Lasix medication.  She was given a dose of IV Lasix.   Work-up revealed CT of her abdomen showing no acute abdominal process but showing bilateral pleural effusions and question of consolidation.  For this reason, she was given Levaquin for possible community-acquired pneumonia.  She has a penicillin allergy.   [KM]  5009 Care signed out to lindsey layden PA due to change of shift.  Patient meets admission criteria due to her hypoxia and CHF exacerbation.  Patient is willing to stay but reports that she really does not want to stay.  I spoke with Dr. Tamala Julian about this patient who would like to try to diurese the patient in the ED and if she is weaned off of oxygen she may go home with the CHF outpatient clinic.  Patient would much prefer this option.  We will see if diuresis helps in the ED.  If not patient will be admitted to the hospitalist.   [KM]    Clinical Course User Index [KM] Kristine Royal   MDM Rules/Calculators/A&P                       Final Clinical Impression(s) / ED Diagnoses Final diagnoses:  None    Rx / DC Orders ED Discharge Orders    None       Kristine Royal 01/14/20 1536    Carmin Muskrat, MD 01/17/20 2044

## 2020-01-14 NOTE — Telephone Encounter (Signed)
Spoke with patient, she started having shortness of breath last night around 3:30 am and has gotten progressively worse. Patient having difficult time talking secondary to shortness of breath Advised patient she needed to hang up and call 911. Phone went dead, called 911 to get EMS out ASAP. Had [facility] LPN in triage call patient back while talking to 911. Patient answered and confirmed address. They will send someone out now to patients home.

## 2020-01-14 NOTE — H&P (Signed)
History and Physical    Virginia Gilbert TGG:[id number] DOB: 29-Aug-1940 DOA: 01/14/2020  Referring MD/NP/PA: Ronnie Doss, PA-C PCP: Pearson Grippe, MD  Patient coming from: Home  Chief Complaint: Shortness of breath and cough  I have personally briefly reviewed patient's old medical records in [facility]   HPI: Virginia Gilbert is a 79 y.o. female with medical history significant of HTN, HLD, paroxysmal atrial fibrillation, diabetes mellitus type 2, and obesity presents with complaints of shortness of breath and cough.  She reports that she was waking up out of her sleep and around 3 AM this morning coughing.  Patient was unable to get back to sleep due to symptoms.  Later on noted episode of nausea and vomiting with epigastric pain.  Denies having any recent sick contacts, fever, chills, diarrhea, or dysuria symptoms.  Her primary care provider had changed her Lasix dose from 20 mg to 10 mg over 2 weeks ago, but this was corrected by her cardiologist Dr. Jacques Navy.  Since that time patient reported that her lower extremity swelling had improved.  Yesterday, she underwent cardioversion by cardiology for atrial fibrillation.  ED Course: Upon emergency department patient was seen to be afebrile respirations 18-26, blood pressure 182/69, and O2 saturations as low as 89% O2 on room air placed on 3 L nasal cannula oxygen with improvement to greater than 90%.  Labs significant for WBC 10.8, hemoglobin 10.1, BUN 21, creatinine 0.85, glucose 176, troponin 16->18, and BNP 98.4.  CT scan of the abdomen and pelvis significant for moderate bilateral effusions and diverticulosis without signs of diverticulitis.  Patient had been ordered 40 mg of Lasix and noted that she would like to go home.  Discussed the [facility] at home program.  However, after patient realized that they had to  Review of Systems  Constitutional: Positive for malaise/fatigue. Negative for fever.  HENT: Negative for congestion and ear discharge.     Eyes: Negative for photophobia and pain.  Respiratory: Positive for cough and shortness of breath.   Cardiovascular: Negative for chest pain and leg swelling.  Gastrointestinal: Positive for nausea and vomiting.  Genitourinary: Negative for dysuria and hematuria.  Musculoskeletal: Negative for falls and myalgias.  Skin: Negative for itching and rash.  Neurological: Negative for focal weakness and loss of consciousness.  Psychiatric/Behavioral: Negative for substance abuse. The patient is nervous/anxious and has insomnia.     Past Medical History:  Diagnosis Date   Diabetes mellitus without complication (HCC)    Hyperlipidemia    Hypertension    Mitral regurgitation    evaluated by TEE 09/2019   Persistent atrial fibrillation (HCC)     Past Surgical History:  Procedure Laterality Date   ATRIAL FIBRILLATION ABLATION N/A 12/02/2019   Procedure: ATRIAL FIBRILLATION ABLATION;  Surgeon: Hillis Range, MD;  Location: [facility] INVASIVE CV LAB;  Service: Cardiovascular;  Laterality: N/A;   BUBBLE STUDY  10/08/2019   Procedure: BUBBLE STUDY;  Surgeon: [name], MD;  Location: [facility] ENDOSCOPY;  Service: Cardiology;;   CARDIOVERSION N/A 07/08/2019   Procedure: CARDIOVERSION;  Surgeon: Yates Decamp, MD;  Location: [facility] ENDOSCOPY;  Service: Cardiovascular;  Laterality: N/A;   CARDIOVERSION N/A 09/02/2019   Procedure: CARDIOVERSION;  Surgeon: Yates Decamp, MD;  Location: [facility] ENDOSCOPY;  Service: Cardiovascular;  Laterality: N/A;   CARDIOVERSION N/A 01/13/2020   Procedure: CARDIOVERSION;  Surgeon: Sande Rives, MD;  Location: [facility] ENDOSCOPY;  Service: Cardiovascular;  Laterality: N/A;   CATARACT EXTRACTION, BILATERAL     PARTIAL HYSTERECTOMY  1977   TEE WITHOUT CARDIOVERSION N/A 10/08/2019   Procedure: TRANSESOPHAGEAL ECHOCARDIOGRAM (TEE);  Surgeon: Elouise Munroe, MD;  Location: [facility];  Service: Cardiology;  Laterality: N/A;     reports that she quit smoking about 34  years ago. Her smoking use included cigarettes. She has a 7.50 pack-year smoking history. She has never used smokeless tobacco. She reports that she does not drink alcohol or use drugs.  Allergies  Allergen Reactions   Penicillins Anaphylaxis    Did it involve swelling of the face/tongue/throat, SOB, or low BP? Yes Did it involve sudden or severe rash/hives, skin peeling, or any reaction on the inside of your mouth or nose? Unknown Did you need to seek medical attention at a hospital or doctor's office? Yes--inpatient when reaction occurred When did it last happen?1964 or 1965 If all above answers are NO, may proceed with cephalosporin use.    Tramadol Itching   Codeine Nausea And Vomiting    Family History  Problem Relation Age of Onset   Heart disease Mother    Diabetes Mother    Emphysema Father    Emphysema Brother    Heart disease Brother    Diabetes Brother    Heart disease Brother    Breast cancer Daughter    Breast cancer Daughter     Prior to Admission medications   Medication Sig Start Date End Date Taking? Authorizing Provider  acetaminophen (TYLENOL) 500 MG tablet Take 500 mg by mouth every 8 (eight) hours as needed for moderate pain or headache.    Yes [provider]  amiodarone (PACERONE) 200 MG tablet Take 1 tablet (200 mg total) by mouth daily. 01/05/20  Yes Fenton, Clint R, PA  apixaban (ELIQUIS) 5 MG TABS tablet Take 1 tablet (5 mg total) by mouth 2 (two) times daily. 10/21/19  Yes Elouise Munroe, MD  carvedilol (COREG) 3.125 MG tablet Take 3.125 mg by mouth 2 (two) times daily.  04/18/18  Yes [provider]  Cholecalciferol (VITAMIN D-3) 125 MCG (5000 UT) TABS Take 5,000 Units by mouth daily with lunch.    Yes [provider]  Cyanocobalamin (B-12) 2500 MCG TABS Take 2,500 mcg by mouth daily with lunch.    Yes [provider]  furosemide (LASIX) 20 MG tablet Take 1 tablet (20 mg total) by mouth 2 (two)  times daily. 12/31/19  Yes Elouise Munroe, MD  glimepiride (AMARYL) 4 MG tablet Take 1 tablet (4 mg total) by mouth daily with breakfast. 12/07/19  Yes Dahal, Marlowe Aschoff, MD  losartan (COZAAR) 100 MG tablet Take 100 mg by mouth daily.  03/06/15  Yes [provider]  metFORMIN (GLUCOPHAGE) 1000 MG tablet Take 1,000 mg by mouth 2 (two) times daily with a meal.  02/26/15  Yes [provider]  pantoprazole (PROTONIX) 40 MG tablet Take 1 tablet (40 mg total) by mouth daily. Patient taking differently: Take 40 mg by mouth daily before breakfast.  12/02/19 01/16/20 Yes Allred, Jeneen Rinks, MD  potassium chloride SA (KLOR-CON) 20 MEQ tablet Take 1 tablet (20 mEq total) by mouth daily. Patient taking differently: Take 20 mEq by mouth daily with lunch.  12/10/19  Yes Elouise Munroe, MD  TRULICITY 1.5 OA/4.1YS SOPN Inject 1.5 mg into the skin every Friday.  05/29/18  Yes [provider]    Physical Exam:  Constitutional: Elderly female who appears to be some respiratory distress Vitals:   01/14/20 1330 01/14/20 1500 01/14/20 1600 01/14/20 1641  BP: (!) 172/80  112/89 (!) 137/53  Pulse:    77  Resp:  (!) 24 19 (!) 22  Temp:      TempSrc:      SpO2:  94% 95% 91%  Weight:      Height:       Eyes: PERRL, lids and conjunctivae normal ENMT: Mucous membranes are moist. Posterior pharynx clear of any exudate or lesions.  Neck: normal, supple, no masses, no thyromegaly.  Mild JVD appreciated. Respiratory: Tachypneic with positive crackles heard in the mid to lower lung fields.  Patient currently on 4 L nasal cannula oxygen with O2 saturations maintained. Cardiovascular: Regular rate and rhythm, no murmurs / rubs / gallops.  Trace lower extremity edema. 2+ pedal pulses. No carotid bruits.  Abdomen: no tenderness, no masses palpated. No hepatosplenomegaly. Bowel sounds positive.  Musculoskeletal: no clubbing / cyanosis. No joint deformity upper and lower extremities. Good ROM, no  contractures. Normal muscle tone.  Skin: no rashes, lesions, ulcers. No induration Neurologic: CN 2-12 grossly intact. Sensation intact, DTR normal. Strength 5/5 in all 4.  Psychiatric: Normal judgment and insight. Alert and oriented x 3. Normal mood.     Labs on Admission: I have personally reviewed following labs and imaging studies  CBC: Recent Labs  Lab 01/14/20 1025  WBC 10.8*  HGB 10.1*  HCT 35.4*  MCV 92.7  PLT 277   Basic Metabolic Panel: Recent Labs  Lab 01/14/20 1025  NA 137  K 3.8  CL 99  CO2 25  GLUCOSE 176*  BUN 21  CREATININE 0.85  CALCIUM 8.4*   GFR: Estimated Creatinine Clearance: 47.7 mL/min (by C-G formula based on SCr of 0.85 mg/dL). Liver Function Tests: Recent Labs  Lab 01/14/20 1025  AST 35  ALT 34  ALKPHOS 57  BILITOT 0.8  PROT 6.3*  ALBUMIN 3.3*   Recent Labs  Lab 01/14/20 1025  LIPASE 25   No results for input(s): AMMONIA in the last 168 hours. Coagulation Profile: No results for input(s): INR, PROTIME in the last 168 hours. Cardiac Enzymes: No results for input(s): CKTOTAL, CKMB, CKMBINDEX, TROPONINI in the last 168 hours. BNP (last 3 results) No results for input(s): PROBNP in the last 8760 hours. HbA1C: No results for input(s): HGBA1C in the last 72 hours. CBG: Recent Labs  Lab 01/13/20 0856 01/14/20 1325  GLUCAP 107* 102*   Lipid Profile: No results for input(s): CHOL, HDL, LDLCALC, TRIG, CHOLHDL, LDLDIRECT in the last 72 hours. Thyroid Function Tests: No results for input(s): TSH, T4TOTAL, FREET4, T3FREE, THYROIDAB in the last 72 hours. Anemia Panel: No results for input(s): VITAMINB12, FOLATE, FERRITIN, TIBC, IRON, RETICCTPCT in the last 72 hours. Urine analysis:    Component Value Date/Time   COLORURINE YELLOW 12/04/2019 1550   APPEARANCEUR CLEAR 12/04/2019 1550   LABSPEC 1.023 12/04/2019 1550   PHURINE 5.0 12/04/2019 1550   GLUCOSEU NEGATIVE 12/04/2019 1550   HGBUR NEGATIVE 12/04/2019 1550   BILIRUBINUR  NEGATIVE 12/04/2019 1550   KETONESUR NEGATIVE 12/04/2019 1550   PROTEINUR NEGATIVE 12/04/2019 1550   NITRITE NEGATIVE 12/04/2019 1550   LEUKOCYTESUR NEGATIVE 12/04/2019 1550   Sepsis Labs: Recent Results (from the past 240 hour(s))  SARS CORONAVIRUS 2 (TAT 6-24 HRS) Nasopharyngeal Nasopharyngeal Swab     Status: None   Collection Time: 01/10/20  1:48 PM   Specimen: Nasopharyngeal Swab  Result Value Ref Range Status   SARS Coronavirus 2 NEGATIVE NEGATIVE Final    Comment: (NOTE) SARS-CoV-2 target nucleic acids are NOT DETECTED. The SARS-CoV-2 RNA  is generally detectable in upper and lower respiratory specimens during the acute phase of infection. Negative results do not preclude SARS-CoV-2 infection, do not rule out co-infections with other pathogens, and should not be used as the sole basis for treatment or other patient management decisions. Negative results must be combined with clinical observations, patient history, and epidemiological information. The expected result is Negative. Fact Sheet for Patients: HairSlick.no Fact Sheet for Healthcare Providers: quierodirigir.com This test is not yet approved or cleared by the [place] FDA and  has been authorized for detection and/or diagnosis of SARS-CoV-2 by FDA under an Emergency Use Authorization (EUA). This EUA will remain  in effect (meaning this test can be used) for the duration of the COVID-19 declaration under Section 56 4(b)(1) of the Act, 21 U.S.C. section 360bbb-3(b)(1), unless the authorization is terminated or revoked sooner. Performed at [facility], [address], [place], [place] [address]   SARS Coronavirus 2 by RT PCR (hospital order, performed in [facility] hospital lab) Nasopharyngeal Nasopharyngeal Swab     Status: None   Collection Time: 01/14/20 10:33 AM   Specimen: Nasopharyngeal Swab  Result Value Ref Range Status   SARS Coronavirus 2  NEGATIVE NEGATIVE Final    Comment: (NOTE) SARS-CoV-2 target nucleic acids are NOT DETECTED. The SARS-CoV-2 RNA is generally detectable in upper and lower respiratory specimens during the acute phase of infection. The lowest concentration of SARS-CoV-2 viral copies this assay can detect is 250 copies / mL. A negative result does not preclude SARS-CoV-2 infection and should not be used as the sole basis for treatment or other patient management decisions.  A negative result may occur with improper specimen collection / handling, submission of specimen other than nasopharyngeal swab, presence of viral mutation(s) within the areas targeted by this assay, and inadequate number of viral copies (<250 copies / mL). A negative result must be combined with clinical observations, patient history, and epidemiological information. Fact Sheet for Patients:   BoilerBrush.com.cy Fact Sheet for Healthcare Providers: https://pope.com/ This test is not yet approved or cleared  by the [place] FDA and has been authorized for detection and/or diagnosis of SARS-CoV-2 by FDA under an Emergency Use Authorization (EUA).  This EUA will remain in effect (meaning this test can be used) for the duration of the COVID-19 declaration under Section 564(b)(1) of the Act, 21 U.S.C. section 360bbb-3(b)(1), unless the authorization is terminated or revoked sooner. Performed at [facility], [address] [place], [place] [address]      Radiological Exams on Admission: CT ABDOMEN PELVIS W CONTRAST  Result Date: 01/14/2020 CLINICAL DATA:  Abdominal pain, recent cardioversion, new onset shortness of breath, diffuse abdominal pain EXAM: CT ABDOMEN AND PELVIS WITH CONTRAST TECHNIQUE: Multidetector CT imaging of the abdomen and pelvis was performed using the standard protocol following bolus administration of intravenous contrast. CONTRAST:  OMNIPAQUE IOHEXOL  300 MG/ML  SOLN COMPARISON:  CT chest, 12/04/2019, CT abdomen pelvis, 11/07/2012 FINDINGS: Lower chest: Moderate bilateral pleural effusions and associated atelectasis or consolidation. Hepatobiliary: No solid liver abnormality is seen. No gallstones, gallbladder wall thickening, or biliary dilatation. Pancreas: Unremarkable. No pancreatic ductal dilatation or surrounding inflammatory changes. Spleen: Normal in size without significant abnormality. Adrenals/Urinary Tract: Small, benign fat containing left adrenal myelolipoma. Tiny nonobstructive calculus of the posterior midportion of the right kidney. Numerous bilateral parapelvic cysts. Bladder is unremarkable. Stomach/Bowel: Stomach is within normal limits. Appendix appears normal. No evidence of bowel wall thickening, distention, or inflammatory  changes. Descending and sigmoid diverticulosis. Vascular/Lymphatic: Aortic atherosclerosis. No enlarged abdominal or pelvic lymph nodes. Reproductive: Status post hysterectomy Other: No abdominal wall hernia or abnormality. No abdominopelvic ascites. Musculoskeletal: No acute or significant osseous findings. IMPRESSION: 1. No acute CT findings of the abdomen or pelvis to explain abdominal pain. 2. Moderate bilateral pleural effusions and associated atelectasis or consolidation. 3. Tiny nonobstructive right renal calculus.  No hydronephrosis. 4. Descending and sigmoid diverticulosis without evidence of diverticulitis. 5. Aortic Atherosclerosis (ICD10-I70.0). Electronically Signed   By: Lauralyn PrimesAlex  Bibbey M.D.   On: 01/14/2020 13:36   DG Chest Port 1 View  Result Date: 01/14/2020 CLINICAL DATA:  Shortness of breath.  Hypoxia.  Abdominal pain. EXAM: PORTABLE CHEST 1 VIEW COMPARISON:  12/05/2019 and 03/31/2019 FINDINGS: The heart size and pulmonary vascularity. Slight diffuse accentuation of the interstitial markings without discrete infiltrates or effusions. The interstitial accentuation is likely due to a shallow inspiration  but could represent slight. No bone abnormality. IMPRESSION: Slight diffuse accentuation of the interstitial markings as described above. Electronically Signed   By: Francene BoyersJames  Maxwell M.D.   On: 01/14/2020 10:55    EKG: Independently reviewed.  Sinus rhythm at 74 bpm  Assessment/Plan  Acute respiratory failure with hypoxia secondary to suspected acute on chronic diastolic congestive heart failure: Patient presents with complaints of lower extremity swelling and shortness of breath.  O2 saturations as low as 70%  on room air reported by EMS for which patient was placed on 3 L nasal cannula oxygen.  Noted temporary changes in her Lasix dose from 20 mg to 10 mg by her primary care provider over 2 weeks ago.  CT scan abdomen significant for moderate bilateral pleural effusions.  Patient just recently had echocardiogram which revealed EF of 60-65% in April.  BNP only noted to be 98.4. Patient having given 40 mg of Lasix IV in ED with adequate diuresis and improvement in respiratory complaints. -Admit a cardiac telemetry bed -Heart failure orders set  initiated  -Continuous pulse oximetry with nasal cannula oxygen as needed to keep O2 saturations >92% -Strict I&Os and daily weights -Elevate lower extremities -Lasix 40 mg IV Bid -Reassess in a.m. and adjust diuresis as needed -Reassess in a.m. and determine if cardiology needs to formally consulted or if symptoms are more likely related to pneumonia  Suspect community-acquired pneumonia: CT scan concerning for bilateral pleural effusions with possible consolidation.  WBC elevated up to 10.8.  Patient has been started empirically on antibiotics of Levaquin.  Patient had just recently underwent cardioversion by cardiology and with nausea and vomiting possibly aspirated. -Elevate head of bed -Aspiration precautions -Check procalcitonin -Change antibiotics to Rocephin and azithromycin -De-escalate antibiotics when medically appropriate  Leukocytosis:  Acute.  WBC elevated at 10.8.   -Recheck CBC in a.m.  Elevated troponin: Troponin 16-> 18 on admission.  Likely secondary to demand in the setting of acute respiratory distress.  Having any chest pain. -Continue to monitor  Paroxysmal atrial fibrillation on chronic anticoagulation: Patient appears to be currently in sinus rhythm. She just underwent cardioversion by cardiology on 6/1. CHA2DS2-VASc score =5.   -Goal potassium 4 and magnesium 2 -Continue amiodarone and Eliquis  Diabetes mellitus type 2: Home medications include Amaryl 4 mg daily, Metformin 1000 mg twice daily, and Trulicity 1.5 mg every Friday.  Patient appears to be relatively well controlled with last hemoglobin A1c noted to be 6.9 on 12/04/2019. -Hypoglycemic protocols -Hold home medication -CBGs before every meal with moderate SSI  Essential hypertension: Home blood  pressure medications include losartan 100 mg daily, furosemide 20 mg to times daily, and Coreg 3.125mg  twice daily. -Hold p.o. furosemide -Continue tolerating losartan  Obesity: BMI 30.9 kg/m    DVT prophylaxis: Eliquis Code Status: Full Family Communication: Daughter present at bedside updated Disposition Plan: Possible discharge home in 2 to 3 days Consults called: None Admission status: Inpatient  Clydie Braun MD [facility] Pager [phone]   If 7PM-7AM, please contact night-coverage [email or web address] Password [facility]  01/14/2020, 4:50 PM

## 2020-01-14 NOTE — Telephone Encounter (Signed)
New Message  Pt c/o Shortness Of Breath: STAT if SOB developed within the last 24 hours or pt is noticeably SOB on the phone  1. Are you currently SOB (can you hear that pt is SOB on the phone)? Yes  2. How long have you been experiencing SOB? Within the last hour  3. Are you SOB when sitting or when up moving around? Both  4. Are you currently experiencing any other symptoms? Really cold

## 2020-01-14 NOTE — ED Triage Notes (Signed)
Pt arrived via [facility] from home c/o SOB and abd pain since 0300 this morning. Pt states she had one episode of emesis this morning. She notified her MD whom notified EMS for transport. Pt now denies SOB but endorses stomach pain. Hx of A-fib and DM2. CBG 234 w/ EMS. Alert and oriented at this time. Family at bedside.

## 2020-01-14 NOTE — Plan of Care (Signed)
[facility]  Consult Note  Presented to the bedside to evaluate the patient and discuss admission to the Hospital at Home program. After discussion, the patient declined admission to the program.    Please do not hesitate to call with questions/concerns.   Levora Dredge, MD 01/14/2020, 4:24 PM  Cell: [phone]

## 2020-01-14 NOTE — Progress Notes (Signed)
Consulted to possibly admit patient for seems more likely a CHF exacerbation.  Patient present with complaints of shortness of breath.  Noted recent change in Lasix dose temporarily by primary.  However, after change in dose developed lower extremity swelling and was switched back few days later.  BNP pending.  CT scan of the abdomen and pelvis noting moderate bilateral pleural effusions.  Patient O2 saturations noted to be as low as 89% on room air was placed on 3 L nasal cannula oxygen.  Patient ordered to be given 40 mg of Lasix IV.  Ultimately patient would like to go home if she has adequate diuresis furosemide.  Dr. Frances Furbish of the at home program notified of the patient and agreed to evaluate.

## 2020-01-14 NOTE — ED Notes (Signed)
Report given.

## 2020-01-14 NOTE — ED Provider Notes (Signed)
Care assumed from [facility], PA-C at shift change with consulting pending.   In brief, this patient is a 79 year old female with past history of diabetes, hypertension, heart failure, atrial fibrillation who presents for evaluation of shortness of breath that began this morning.  She reports that when she woke up this morning, she was in a coughing fit and could not catch her breath.  It became progressively worse until she could not breathe.  She called EMS and she was satting at 70% on room air.  She was placed on 4 L O2 which improved her O2 sats.  She is not on home oxygen. Please see note from previous provider for full history/physical exam.    Physical Exam  BP (!) 137/53   Pulse 77   Temp 98.5 F (36.9 C) (Oral)   Resp (!) 22   Ht 4\' 11"  (1.499 m)   Wt 69.4 kg   SpO2 91%   BMI 30.90 kg/m   Physical Exam  ED Course/Procedures   Clinical Course as of Jan 13 1753  Wed Jan 14, 2020  1418 Patient is a 79 year old with CHF presenting to the emergency department for shortness of breath and epigastric pain.  Patient was in the 70s on room air on arrival, no hx of needing home oxygen.  This is in the context of recently decreasing her Lasix medication.  She was given a dose of IV Lasix.  Work-up revealed CT of her abdomen showing no acute abdominal process but showing bilateral pleural effusions and question of consolidation.  For this reason, she was given Levaquin for possible community-acquired pneumonia.  She has a penicillin allergy.   [KM]  1534 Care signed out to Averyanna Sax PA due to change of shift.  Patient meets admission criteria due to her hypoxia and CHF exacerbation.  Patient is willing to stay but reports that she really does not want to stay.  I spoke with Dr. 73 about this patient who would like to try to diurese the patient in the ED and if she is weaned off of oxygen she may go home with the CHF outpatient clinic.  Patient would much prefer this option.  We will  see if diuresis helps in the ED.  If not patient will be admitted to the hospitalist.   [KM]    Clinical Course User Index [KM] Katrinka Blazing, PA-C    Procedures  MDM   PLAN: Concern for possible infectious etiology.  There is some questionable pneumonia seen on the lower lobes of her CT on pelvis.  Also question if this is CHF exacerbation. Previous provider had discussed with hospitalist regarding admission given oxygen requirement.  Patient is agreeable to be admitted but is not her primary wish.  We discussed further options and she is agreeable to trying the [facility] in hospital at home program.  Hospitalist agrees.  We will plan to consult them.  MDM:  Discussed patient with [facility]. [facility]).  After discussing with patient, patient does not feel that this is the best option for her and is concerned about home support.  We will plan for admission.  1. Shortness of breath   2. Hypoxia      Portions of this note were generated with [facility]. Dictation errors may occur despite best attempts at proofreading.   Scientist, clinical (histocompatibility and immunogenetics), PA-C 01/14/20 1754    03/15/20, MD 01/17/20 0800

## 2020-01-15 DIAGNOSIS — E1165 Type 2 diabetes mellitus with hyperglycemia: Secondary | ICD-10-CM

## 2020-01-15 LAB — BASIC METABOLIC PANEL
Anion gap: 9 (ref 5–15)
BUN: 17 mg/dL (ref 8–23)
CO2: 29 mmol/L (ref 22–32)
Calcium: 7.9 mg/dL — ABNORMAL LOW (ref 8.9–10.3)
Chloride: 96 mmol/L — ABNORMAL LOW (ref 98–111)
Creatinine, Ser: 0.8 mg/dL (ref 0.44–1.00)
GFR calc Af Amer: >60 mL/min (ref >60)
GFR calc non Af Amer: >60 mL/min (ref >60)
Glucose, Bld: 92 mg/dL (ref 70–99)
Potassium: 3.5 mmol/L (ref 3.5–5.1)
Sodium: 134 mmol/L — ABNORMAL LOW (ref 135–145)

## 2020-01-15 LAB — MAGNESIUM: Magnesium: 1.3 mg/dL — ABNORMAL LOW (ref 1.7–2.4)

## 2020-01-15 LAB — GLUCOSE, CAPILLARY
Glucose-Capillary: 84 mg/dL (ref 70–99)
Glucose-Capillary: 149 mg/dL — ABNORMAL HIGH (ref 70–99)
Glucose-Capillary: 154 mg/dL — ABNORMAL HIGH (ref 70–99)

## 2020-01-15 LAB — PROCALCITONIN: Procalcitonin: <0.1 ng/mL

## 2020-01-15 MED ORDER — LOSARTAN POTASSIUM 25 MG PO TABS: 25.0000 mg | ORAL_TABLET | Freq: Every day | ORAL | Status: DC

## 2020-01-15 NOTE — Progress Notes (Signed)
PROGRESS NOTE  Virginia Gilbert PXT:[id number] DOB: [birth year]   PCP: Pearson Grippe, MD  Patient is from: Home  DOA: 01/14/2020 LOS: 1  Brief Narrative / Interim history: 79 year old female with history of paroxysmal A. fib, DM-2, obesity, HTN and HLD presenting with hypoxia to 70%, shortness of breath, cough, nausea, vomiting and epigastric pain.  In ED, afebrile.  RR 18-26.  BP 182/69.  Saturation 89% on RA but placed on 3 L by [place].  WBC 10.8.  Hgb 10.1.  BMP without significant finding.  BNP 98.  CT abdomen and pelvis significant for moderate bilateral effusions.  Started on IV Lasix and antibiotics, admitted for acute on chronic respiratory failure with hypoxia and pneumonia.  The next day, breathing improved.  Procalcitonin negative.  Antibiotics discontinued.  Continued on IV Lasix.  She could be transitioned to p.o. Lasix and discharged on 6/4.  Subjective: No major events overnight of this morning.  Reports improvement in her breathing.  She denies chest pain, GI or UTI symptoms.   Objective: Vitals:   01/15/20 0003 01/15/20 0353 01/15/20 0718 01/15/20 1156  BP: (!) 110/92 (!) 108/45 (!) 125/55 (!) 110/97  Pulse: 82 60 67 72  Resp: 19 19 18 18   Temp: 98 F (36.7 C) 97.9 F (36.6 C) 98.1 F (36.7 C) 99.1 F (37.3 C)  TempSrc: Oral Oral Oral Oral  SpO2: 98% 95% 94% 91%  Weight: 69.6 kg     Height:        Intake/Output Summary (Last 24 hours) at 01/15/2020 1431 Last data filed at 01/15/2020 1300 Gross per 24 hour  Intake 583 ml  Output 2000 ml  Net -1417 ml   Filed Weights   01/14/20 0958 01/15/20 0003  Weight: 69.4 kg 69.6 kg    Examination:  GENERAL: No apparent distress.  Nontoxic. HEENT: MMM.  Vision and hearing grossly intact.  NECK: Supple.  Notable JVD. RESP: On 2 L by .  No IWOB.  Bibasilar crackles. CVS:  RRR. Heart sounds normal.  ABD/GI/GU: BS+. Abd soft, NTND.  MSK/EXT:  Moves extremities. No apparent deformity. No edema.  SKIN: no apparent skin lesion  or wound NEURO: Awake, alert and oriented appropriately.  No apparent focal neuro deficit. PSYCH: Calm. Normal affect.  Procedures:  None  Microbiology summarized: COVID-19 PCR negative.  Assessment & Plan: Acute on chronic RF with hypoxia: Likely due to CHF exacerbation versus pneumonia.  Saturating at 70% on RA per EMS.  Improving. -Treat CHF as below -Wean oxygen as able -Encourage incentive spirometry -OOB/PT/OT  Acute on chronic diastolic CHF: Echo on 12/05/2019 with EF of 60 to 65%, indeterminate diastolic functions, moderate LAE.  Presents with respiratory and GI symptoms.  Exam with JVD and bibasilar crackles.  Breathing improved with IV Lasix.  UOP 1.2 L / 24 hours.  Renal function improving. -Continue IV Lasix 40 mg twice daily -Monitor fluid status, renal function and electrolytes -Sodium and fluid restrictions  Pneumonia-unlikely with negative procalcitonin.  Respiratory symptoms likely due to CHF. -Discontinued antibiotics -Continue monitoring of antibiotics.  Moderate bilateral pleural effusion: Likely due to CHF. -Diuretics as above. -Continue to repeat CXR to see improvement with diuretics.  Paroxysmal A. fib without RVR.  Just had DCCV on 6/1.  On amiodarone and Eliquis.  CHA2DS2-VASc score 5. -Continue amiodarone and Eliquis -Optimize Mg and K  Controlled DM-2 with hyperglycemia: A1c 6.9% on 12/04/2019. On Amaryl, Metformin and Trulicity Recent Labs    01/15/20 0608 01/15/20 1117 01/15/20 1614  GLUCAP  84 149* 154*  -Continue SSI-thin -Continue statin  Uncontrolled hypertension: BP 182/69 on presentation.  Normotensive for most part now -Continue low-dose Coreg -Hold losartan to allow room for diuretics -IV Lasix as above.          DVT prophylaxis: On Eliquis for A. fib Code Status: Full code Family Communication: Patient and/or RN. Available if any question.  Status is: Inpatient  Remains inpatient appropriate because:IV treatments  appropriate due to intensity of illness or inability to take PO, Inpatient level of care appropriate due to severity of illness and Ongoing supplemental oxygen requirement   Dispo: The patient is from: Home              Anticipated d/c is to: Home              Anticipated d/c date is: 1 day              Patient currently is not medically stable to d/c.       Consultants:  None   Sch Meds:  Scheduled Meds: . amiodarone  200 mg Oral Daily  . apixaban  5 mg Oral BID  . carvedilol  3.125 mg Oral BID  . furosemide  40 mg Intravenous BID  . guaiFENesin  600 mg Oral BID  . insulin aspart  0-5 Units Subcutaneous QHS  . insulin aspart  0-9 Units Subcutaneous TID WC  . pantoprazole  40 mg Oral QAC breakfast  . potassium chloride SA  20 mEq Oral Q lunch  . sodium chloride flush  3 mL Intravenous Q12H   Continuous Infusions: . sodium chloride     PRN Meds:.sodium chloride, acetaminophen, ondansetron (ZOFRAN) IV, sodium chloride flush  Antimicrobials: Anti-infectives (From admission, onward)   Start     Dose/Rate Route Frequency Ordered Stop   01/15/20 1700  azithromycin (ZITHROMAX) 500 mg in sodium chloride 0.9 % 250 mL IVPB  Status:  Discontinued     500 mg 250 mL/hr over 60 Minutes Intravenous Every 24 hours 01/14/20 1719 01/15/20 0753   01/15/20 1600  cefTRIAXone (ROCEPHIN) 2 g in sodium chloride 0.9 % 100 mL IVPB  Status:  Discontinued     2 g 200 mL/hr over 30 Minutes Intravenous Every 24 hours 01/14/20 1719 01/15/20 0753   01/14/20 1430  levofloxacin (LEVAQUIN) IVPB 750 mg  Status:  Discontinued     750 mg 100 mL/hr over 90 Minutes Intravenous Every 24 hours 01/14/20 1417 01/14/20 1719       I have personally reviewed the following labs and images: CBC: Recent Labs  Lab 01/14/20 1025  WBC 10.8*  HGB 10.1*  HCT 35.4*  MCV 92.7  PLT 277   BMP &GFR Recent Labs  Lab 01/14/20 1025 01/15/20 0358  NA 137 134*  K 3.8 3.5  CL 99 96*  CO2 25 29  GLUCOSE 176* 92    BUN 21 17  CREATININE 0.85 0.80  CALCIUM 8.4* 7.9*  MG  --  1.3*   Estimated Creatinine Clearance: 50.8 mL/min (by C-G formula based on SCr of 0.8 mg/dL). Liver & Pancreas: Recent Labs  Lab 01/14/20 1025  AST 35  ALT 34  ALKPHOS 57  BILITOT 0.8  PROT 6.3*  ALBUMIN 3.3*   Recent Labs  Lab 01/14/20 1025  LIPASE 25   No results for input(s): AMMONIA in the last 168 hours. Diabetic: No results for input(s): HGBA1C in the last 72 hours. Recent Labs  Lab 01/14/20 1325 01/14/20 1811 01/14/20 2113 01/15/20  2992 01/15/20 1117  GLUCAP 102* 93 107* 84 149*   Cardiac Enzymes: No results for input(s): CKTOTAL, CKMB, CKMBINDEX, TROPONINI in the last 168 hours. No results for input(s): PROBNP in the last 8760 hours. Coagulation Profile: No results for input(s): INR, PROTIME in the last 168 hours. Thyroid Function Tests: No results for input(s): TSH, T4TOTAL, FREET4, T3FREE, THYROIDAB in the last 72 hours. Lipid Profile: No results for input(s): CHOL, HDL, LDLCALC, TRIG, CHOLHDL, LDLDIRECT in the last 72 hours. Anemia Panel: No results for input(s): VITAMINB12, FOLATE, FERRITIN, TIBC, IRON, RETICCTPCT in the last 72 hours. Urine analysis:    Component Value Date/Time   COLORURINE YELLOW 12/04/2019 1550   APPEARANCEUR CLEAR 12/04/2019 1550   LABSPEC 1.023 12/04/2019 1550   PHURINE 5.0 12/04/2019 1550   GLUCOSEU NEGATIVE 12/04/2019 1550   HGBUR NEGATIVE 12/04/2019 1550   BILIRUBINUR NEGATIVE 12/04/2019 1550   KETONESUR NEGATIVE 12/04/2019 1550   PROTEINUR NEGATIVE 12/04/2019 1550   NITRITE NEGATIVE 12/04/2019 1550   LEUKOCYTESUR NEGATIVE 12/04/2019 1550   Sepsis Labs: Invalid input(s): PROCALCITONIN, LACTICIDVEN  Microbiology: Recent Results (from the past 240 hour(s))  SARS CORONAVIRUS 2 (TAT 6-24 HRS) Nasopharyngeal Nasopharyngeal Swab     Status: None   Collection Time: 01/10/20  1:48 PM   Specimen: Nasopharyngeal Swab  Result Value Ref Range Status   SARS  Coronavirus 2 NEGATIVE NEGATIVE Final    Comment: (NOTE) SARS-CoV-2 target nucleic acids are NOT DETECTED. The SARS-CoV-2 RNA is generally detectable in upper and lower respiratory specimens during the acute phase of infection. Negative results do not preclude SARS-CoV-2 infection, do not rule out co-infections with other pathogens, and should not be used as the sole basis for treatment or other patient management decisions. Negative results must be combined with clinical observations, patient history, and epidemiological information. The expected result is Negative. Fact Sheet for Patients: HairSlick.no Fact Sheet for Healthcare Providers: quierodirigir.com This test is not yet approved or cleared by the [place] FDA and  has been authorized for detection and/or diagnosis of SARS-CoV-2 by FDA under an Emergency Use Authorization (EUA). This EUA will remain  in effect (meaning this test can be used) for the duration of the COVID-19 declaration under Section 56 4(b)(1) of the Act, 21 U.S.C. section 360bbb-3(b)(1), unless the authorization is terminated or revoked sooner. Performed at [facility], [address] [place], [place] [address]   SARS Coronavirus 2 by RT PCR (hospital order, performed in [facility] hospital lab) Nasopharyngeal Nasopharyngeal Swab     Status: None   Collection Time: 01/14/20 10:33 AM   Specimen: Nasopharyngeal Swab  Result Value Ref Range Status   SARS Coronavirus 2 NEGATIVE NEGATIVE Final    Comment: (NOTE) SARS-CoV-2 target nucleic acids are NOT DETECTED. The SARS-CoV-2 RNA is generally detectable in upper and lower respiratory specimens during the acute phase of infection. The lowest concentration of SARS-CoV-2 viral copies this assay can detect is 250 copies / mL. A negative result does not preclude SARS-CoV-2 infection and should not be used as the sole basis for treatment or  other patient management decisions.  A negative result may occur with improper specimen collection / handling, submission of specimen other than nasopharyngeal swab, presence of viral mutation(s) within the areas targeted by this assay, and inadequate number of viral copies (<250 copies / mL). A negative result must be combined with clinical observations, patient history, and epidemiological information. Fact Sheet for Patients:   BoilerBrush.com.cy Fact Sheet for Healthcare Providers: https://pope.com/ This  test is not yet approved or cleared  by the [place] and has been authorized for detection and/or diagnosis of SARS-CoV-2 by FDA under an Emergency Use Authorization (EUA).  This EUA will remain in effect (meaning this test can be used) for the duration of the COVID-19 declaration under Section 564(b)(1) of the Act, 21 U.S.C. section 360bbb-3(b)(1), unless the authorization is terminated or revoked sooner. Performed at [facility], [place] [address], [place],  [address]     Radiology Studies: No results found.    Ahmari Garton T. [place]  If 7PM-7AM, please contact night-coverage [email or web address] Password [facility] 01/15/2020, 2:31 PM

## 2020-01-15 NOTE — Progress Notes (Deleted)
   01/15/20 2050  Assess: MEWS Score  Temp 99.5 F (37.5 C)  BP 125/72  Pulse Rate (!) 119  Resp 20  SpO2 98 %  Assess: MEWS Score  MEWS Temp 0  MEWS Systolic 0  MEWS Pulse 2  MEWS RR 0  MEWS LOC 0  MEWS Score 2  MEWS Score Color Yellow  Assess: if the MEWS score is Yellow or Red  Were vital signs taken at a resting state? Yes  Focused Assessment Documented focused assessment  Early Detection of Sepsis Score *See Row Information* Low  MEWS guidelines implemented *See Row Information* No, vital signs rechecked  It is unknown if these vitals were taken at a resting state. Upon rechecking vital signs, MEWS score had returned to baseline green. Will continue to monitor for changes in condition; however, no interventions or escalations were performed as the patient had returned to baseline prior to or during assessment.

## 2020-01-15 NOTE — Progress Notes (Signed)
   01/15/20 2050  Assess: MEWS Score  Temp 99.5 F (37.5 C)  BP 125/72  Pulse Rate (!) 119  Resp 20  SpO2 98 %  Assess: MEWS Score  MEWS Temp 0  MEWS Systolic 0  MEWS Pulse 2  MEWS RR 0  MEWS LOC 0  MEWS Score 2  MEWS Score Color Yellow  Assess: if the MEWS score is Yellow or Red  Were vital signs taken at a resting state? Yes  Focused Assessment Documented focused assessment  Early Detection of Sepsis Score *See Row Information* Low  MEWS guidelines implemented *See Row Information* No, vital signs rechecked  Treat  MEWS Interventions Other (Comment) (reassessed vital signs)  Notify: Charge Nurse/RN  Name of Charge Nurse/RN Notified Emergency planning/management officer  Date Charge Nurse/RN Notified 01/15/20  Time Charge Nurse/RN Notified 2100  Document  Patient Outcome Other (Comment) (stabilized after no interventions)  Progress note created (see row info) Yes  It is unknown if these vitals were taken at a resting state. Upon rechecking vital signs, MEWS score had returned to baseline green. Will continue to monitor for changes in condition; however, no interventions or escalations were performed as the patient had returned to baseline prior to or during assessment.

## 2020-01-15 NOTE — Progress Notes (Signed)
Pt home medication [place] to pharmacy

## 2020-01-16 ENCOUNTER — Inpatient Hospital Stay (HOSPITAL_COMMUNITY): Payer: Medicare Other

## 2020-01-16 LAB — MAGNESIUM: Magnesium: 1.6 mg/dL — ABNORMAL LOW (ref 1.7–2.4)

## 2020-01-16 LAB — RENAL FUNCTION PANEL
Albumin: 2.8 g/dL — ABNORMAL LOW (ref 3.5–5.0)
Anion gap: 9 (ref 5–15)
BUN: 14 mg/dL (ref 8–23)
CO2: 31 mmol/L (ref 22–32)
Calcium: 8 mg/dL — ABNORMAL LOW (ref 8.9–10.3)
Chloride: 93 mmol/L — ABNORMAL LOW (ref 98–111)
Creatinine, Ser: 0.81 mg/dL (ref 0.44–1.00)
GFR calc Af Amer: >60 mL/min (ref >60)
GFR calc non Af Amer: >60 mL/min (ref >60)
Glucose, Bld: 132 mg/dL — ABNORMAL HIGH (ref 70–99)
Phosphorus: 2.6 mg/dL (ref 2.5–4.6)
Potassium: 3.3 mmol/L — ABNORMAL LOW (ref 3.5–5.1)
Sodium: 133 mmol/L — ABNORMAL LOW (ref 135–145)

## 2020-01-16 LAB — CBC
HCT: 30.5 % — ABNORMAL LOW (ref 36.0–46.0)
Hemoglobin: 9.1 g/dL — ABNORMAL LOW (ref 12.0–15.0)
MCH: 26.9 pg (ref 26.0–34.0)
MCHC: 29.8 g/dL — ABNORMAL LOW (ref 30.0–36.0)
MCV: 90.2 fL (ref 80.0–100.0)
Platelets: 229 10*3/uL (ref 150–400)
RBC: 3.38 MIL/uL — ABNORMAL LOW (ref 3.87–5.11)
RDW: 15.5 % (ref 11.5–15.5)
WBC: 7.8 10*3/uL (ref 4.0–10.5)
nRBC: 0 % (ref 0.0–0.2)

## 2020-01-16 LAB — GLUCOSE, CAPILLARY
Glucose-Capillary: 245 mg/dL — ABNORMAL HIGH (ref 70–99)
Glucose-Capillary: 119 mg/dL — ABNORMAL HIGH (ref 70–99)
Glucose-Capillary: 200 mg/dL — ABNORMAL HIGH (ref 70–99)

## 2020-01-16 MED ORDER — MAGNESIUM SULFATE 2 GM/50ML IV SOLN
2.0000 g | Freq: Once | INTRAVENOUS | Status: AC
  Administered 2020-01-16: 2 g via INTRAVENOUS
  Filled 2020-01-16: qty 50

## 2020-01-16 MED ORDER — POTASSIUM CHLORIDE CRYS ER 20 MEQ PO TBCR
40.0000 meq | EXTENDED_RELEASE_TABLET | Freq: Every day | ORAL | Status: DC
  Administered 2020-01-16: 40 meq via ORAL
  Filled 2020-01-16: qty 2

## 2020-01-16 MED ORDER — FUROSEMIDE 20 MG PO TABS: 20.0000 mg | ORAL_TABLET | Freq: Two times a day (BID) | ORAL | 0 refills | Status: DC

## 2020-01-16 NOTE — Progress Notes (Signed)
Called to pt room approx. 1530. Pt in the chair daughter at the bedside.  Pt upset stating I want to leave now come take this out now" pointing to her IV. Approached the pt and explained to pt her discharge order was just placed we just need to wait for the oxygen to be delivered to the room. She stated she just got off the phone with the oxygen company and they wanted a Credit card to put on file and she stated they are all trying to take my money. I informed her and her daughter the risks of leaving without the oxygen and she would be leaving against medical advice. She stated she was ok with that. Informed MD. Charge Nurse made aware of situation and went to pharmacy to pick up pt medications from home in main pharmacy. IV and tele removed Signed AMA forms and medication given back to pt. Pt walked off the unit with daughter.

## 2020-01-16 NOTE — Progress Notes (Signed)
SATURATION QUALIFICATIONS: (This note is used to comply with regulatory documentation for home oxygen)  Patient Saturations on Room Air at Rest = 93%  Patient Saturations on Room Air while Ambulating = 84%   Patient Saturations on 2 Liters of oxygen while Ambulating = 94%  Please briefly explain why patient needs home oxygen:Pt desats on RA with activity. Will need O2 at home.  Airen Stiehl W,PT Acute Rehabilitation Services Pager:  [phone]  Office:  [phone]

## 2020-01-16 NOTE — Evaluation (Addendum)
Physical Therapy Evaluation Patient Details Name: Virginia Gilbert MRN: [id number] DOB: 1941-03-23 Today's Date: 01/16/2020   History of Present Illness  79 year old female with history of paroxysmal A. fib, DM-2, obesity, HTN and HLD presenting with hypoxia to 70%, shortness of breath, cough, nausea, vomiting and epigastric pain.  Clinical Impression  Pt admitted with above diagnosis. Pt was able to ambulate with RW with good safety. Pt encouraged to use RW at home and she reluctantly agreed.  Pt also with O2 desaturation to 84% on RA with activity.  Pt educated as to the fact she is needing O2 and the importance of use at home. 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 Pt lacks awareness into her deficits. Will continue acute PT.   Pt currently with functional limitations due to the deficits listed below (see PT Problem List). Pt will benefit from skilled PT to increase their independence and safety with mobility to allow discharge to the venue listed below.    Follow Up Recommendations Home health PT;Supervision - Intermittent    Equipment Recommendations  Other (comment)(home O2)    Recommendations for Other Services       Precautions / Restrictions Precautions Precautions: Fall Restrictions Weight Bearing Restrictions: No      Mobility  Bed Mobility Overal bed mobility: Independent                Transfers Overall transfer level: Independent                  Ambulation/Gait Ambulation/Gait assistance: Supervision;Min assist Gait Distance (Feet): 400 Feet Assistive device: Rolling walker (2 wheeled);None Gait Pattern/deviations: Step-through pattern;Decreased stride length;Drifts right/left   Gait velocity interpretation: <1.31 ft/sec, indicative of household ambulator General Gait Details: Pt unsteady without RW but very steady with RW.  Encouraged pt to use RW at home and she was hesitant but then  stated "I will if I need it.".  Pt desaturated to 84% on RA with activity needing 2L to keep sats 94% and >.  Pt given explanation for O2 with daughter also in room and encouraging pt to use it but pt states, " I will not use the O2 at home. "  Stairs Stairs: Yes Stairs assistance: Supervision Stair Management: One rail Right;Alternating pattern;Forwards Number of Stairs: 4 General stair comments: No issues up and down stairs.   Wheelchair Mobility    Modified Rankin (Stroke Patients Only)       Balance Overall balance assessment: Needs assistance Sitting-balance support: No upper extremity supported;Feet supported Sitting balance-Leahy Scale: Fair     Standing balance support: Bilateral upper extremity supported;No upper extremity supported;During functional activity Standing balance-Leahy Scale: Poor Standing balance comment: relies on UE support when fatigued as she rushes due to incr DOE 3/4.                              Pertinent Vitals/Pain Pain Assessment: No/denies pain    Home Living Family/patient expects to be discharged to:: Private residence Living Arrangements: Other relatives Available Help at Discharge: Family;Available 24 hours/day Type of Home: Mobile home Home Access: Stairs to enter Entrance Stairs-Rails: Right;Left;Can reach both Entrance Stairs-Number of Steps: 6 Home Layout: One level Home Equipment: Walker - 2 wheels;Bedside commode;Walker - standard;Shower seat;Hand held shower head;Grab bars - tub/shower;Grab bars - toilet      Prior Function Level  of Independence: Independent               Hand Dominance   Dominant Hand: Right    Extremity/Trunk Assessment   Upper Extremity Assessment Upper Extremity Assessment: Defer to OT evaluation    Lower Extremity Assessment Lower Extremity Assessment: Generalized weakness    Cervical / Trunk Assessment Cervical / Trunk Assessment: Normal  Communication   Communication:  No difficulties  Cognition Arousal/Alertness: Awake/alert Behavior During Therapy: WFL for tasks assessed/performed Overall Cognitive Status: Within Functional Limits for tasks assessed                                 General Comments: Pt with poor awareness into deficits with pt stating, I will not use O2 at home even though explained why O2 is necessary with her desaturation.       General Comments      Exercises     Assessment/Plan    PT Assessment Patient needs continued PT services  PT Problem List Decreased activity tolerance;Decreased balance;Decreased mobility;Decreased knowledge of use of DME;Decreased safety awareness;Decreased knowledge of precautions;Cardiopulmonary status limiting activity       PT Treatment Interventions DME instruction;Gait training;Functional mobility training;Therapeutic activities;Therapeutic exercise;Balance training;Patient/family education;Stair training    PT Goals (Current goals can be found in the Care Plan section)  Acute Rehab PT Goals Patient Stated Goal: to go home PT Goal Formulation: With patient Time For Goal Achievement: 01/30/20 Potential to Achieve Goals: Good    Frequency Min 3X/week   Barriers to discharge        Co-evaluation               AM-PAC PT "6 Clicks" Mobility  Outcome Measure Help needed turning from your back to your side while in a flat bed without using bedrails?: None Help needed moving from lying on your back to sitting on the side of a flat bed without using bedrails?: None Help needed moving to and from a bed to a chair (including a wheelchair)?: None Help needed standing up from a chair using your arms (e.g., wheelchair or bedside chair)?: A Little Help needed to walk in hospital room?: A Little Help needed climbing 3-5 steps with a railing? : A Little 6 Click Score: 21    End of Session Equipment Utilized During Treatment: Gait belt;Oxygen Activity Tolerance: Patient limited  by fatigue Patient left: in chair;with call bell/phone within reach;with chair alarm set;with family/visitor present Nurse Communication: Mobility status PT Visit Diagnosis: Muscle weakness (generalized) (M62.81);Other abnormalities of gait and mobility (R26.89)    Time: 8563-1497 PT Time Calculation (min) (ACUTE ONLY): 24 min   Charges:   PT Evaluation $PT Eval Moderate Complexity: 1 Mod PT Treatments $Gait Training: 8-22 mins        Samyia Motter W,PT Acute Rehabilitation Services Pager:  [phone]  Office:  [phone]    Berline Lopes 01/16/2020, 12:56 PM

## 2020-01-16 NOTE — Discharge Instructions (Signed)

## 2020-01-16 NOTE — Evaluation (Signed)
Occupational Therapy Evaluation Patient Details Name: Virginia Gilbert MRN: [id number] DOB: 05/21/1941 Today's Date: 01/16/2020    History of Present Illness 79 year old female with history of paroxysmal A. fib, DM-2, obesity, HTN and HLD presenting with hypoxia to 70%, shortness of breath, cough, nausea, vomiting and epigastric pain.   Clinical Impression   PTA pt living with family, reports she is independent for BADL/IADL. At time of eval, pt is able to complete transfers at mod I level and mobility with min guard assist and RW. Pt currently requires 2L [place] for mobility, but refuses to use it despite education. Educated pt on ECS techniques, especially pursed lip breathing and rest breaks knowing that she is noncompliant with O2. Issued pt IS- she is currently pulling with fatigue. Encouraged pt to continue working on IS to improve pulmonary hygiene for increased BADL engagement. OT will sign off in anticipation for d/c.    Follow Up Recommendations  No OT follow up;Supervision - Intermittent    Equipment Recommendations  None recommended by OT    Recommendations for Other Services       Precautions / Restrictions Precautions Precautions: Fall Restrictions Weight Bearing Restrictions: No      Mobility Bed Mobility Overal bed mobility: Independent                Transfers Overall transfer level: Modified independent                    Balance Overall balance assessment: Needs assistance   Sitting balance-Leahy Scale: Fair     Standing balance support: Bilateral upper extremity supported;No upper extremity supported;During functional activity Standing balance-Leahy Scale: Poor Standing balance comment: relies on UE support when fatigued as she rushes due to incr DOE 3/4.                            ADL either performed or assessed with clinical judgement   ADL Overall ADL's : Needs assistance/impaired Eating/Feeding: Set up;Sitting    Grooming: Set up;Sitting   Upper Body Bathing: Set up;Sitting   Lower Body Bathing: Set up;Sitting/lateral leans   Upper Body Dressing : Set up;Sitting   Lower Body Dressing: Set up;Sitting/lateral leans;Sit to/from stand   Toilet Transfer: Min guard;Ambulation;Regular Toilet;Grab bars;RW   Toileting- Clothing Manipulation and Hygiene: Set up;Sitting/lateral lean;Sit to/from stand   Tub/ Shower Transfer: Min guard   Functional mobility during ADLs: Min guard;Rolling walker       Vision Baseline Vision/History: Wears glasses Wears Glasses: At all times Patient Visual Report: No change from baseline       Perception     Praxis      Pertinent Vitals/Pain Pain Assessment: No/denies pain     Hand Dominance     Extremity/Trunk Assessment Upper Extremity Assessment Upper Extremity Assessment: Overall WFL for tasks assessed   Lower Extremity Assessment Lower Extremity Assessment: Defer to PT evaluation       Communication Communication Communication: No difficulties   Cognition Arousal/Alertness: Awake/alert Behavior During Therapy: WFL for tasks assessed/performed Overall Cognitive Status: No family/caregiver present to determine baseline cognitive functioning Area of Impairment: Safety/judgement                         Safety/Judgement: Decreased awareness of deficits;Decreased awareness of safety     General Comments: reports that she will not use O2 despite readmissions adn explanation.   General Comments  Exercises     Shoulder Instructions      Home Living Family/patient expects to be discharged to:: Private residence Living Arrangements: Other relatives Available Help at Discharge: Family;Available 24 hours/day Type of Home: Mobile home Home Access: Stairs to enter Entrance Stairs-Number of Steps: 6 Entrance Stairs-Rails: Right;Left;Can reach both Home Layout: One level     Bathroom Shower/Tub: Radiographer, therapeutic: Standard     Home Equipment: Environmental consultant - 2 wheels;Bedside commode;Walker - standard;Shower seat;Hand held shower head;Grab bars - tub/shower;Grab bars - toilet          Prior Functioning/Environment Level of Independence: Independent        Comments: drives, she works as caregiver, keeps 3 in 1 over toilet and sits to shower        OT Problem List: Decreased knowledge of use of DME or AE;Cardiopulmonary status limiting activity;Decreased activity tolerance      OT Treatment/Interventions:      OT Goals(Current goals can be found in the care plan section) Acute Rehab OT Goals Patient Stated Goal: to go home OT Goal Formulation: All assessment and education complete, DC therapy  OT Frequency:     Barriers to D/C:            Co-evaluation              AM-PAC OT "6 Clicks" Daily Activity     Outcome Measure Help from another person eating meals?: None Help from another person taking care of personal grooming?: None Help from another person toileting, which includes using toliet, bedpan, or urinal?: None Help from another person bathing (including washing, rinsing, drying)?: None Help from another person to put on and taking off regular upper body clothing?: None Help from another person to put on and taking off regular lower body clothing?: None 6 Click Score: 24   End of Session Nurse Communication: Mobility status  Activity Tolerance: Patient tolerated treatment well Patient left: in chair;with call bell/phone within reach;with family/visitor present  OT Visit Diagnosis: Other abnormalities of gait and mobility (R26.89)                Time: 6834-1962 OT Time Calculation (min): 12 min Charges:  OT General Charges $OT Visit: 1 Visit OT Evaluation $OT Eval Low Complexity: 1 Low  Zenovia Jarred, MSOT, OTR/L Acute Rehabilitation Services [facility] Office Number: [phone] Pager: [phone]  Zenovia Jarred 01/16/2020, 4:36 PM

## 2020-01-16 NOTE — TOC Transition Note (Signed)
Transition of Care [facility]) - CM/SW Discharge Note   Patient Details  Name: Virginia Gilbert MRN: [id number] Date of Birth: Aug 16, 1940  Transition of Care [facility]) CM/SW Contact:  Leone Haven, RN Phone Number: 01/16/2020, 8:58 AM   Clinical Narrative:    NCM spoke with patient, asked if she would like a HHRN for CHF disease manangement, she states no, she states she does not need any DME.  She goes to [employer] in [place].  She has transportation to go home at [employer].  She has no other needs.    Final next level of care: Home/Self Care Barriers to Discharge: No Barriers Identified   Patient Goals and CMS Choice Patient states their goals for this hospitalization and ongoing recovery are:: get better   Choice offered to / list presented to : NA  Discharge Placement                       Discharge Plan and Services                          HH Arranged: NA          Social Determinants of Health (SDOH) Interventions     Readmission Risk Interventions No flowsheet data found.

## 2020-01-16 NOTE — Discharge Summary (Signed)
Physician Discharge Summary  ARPI DIEBOLD PYK:[id number] DOB: [birth year]  PCP: Pearson Grippe, MD  Admitted from: Home Discharged to: Home  Admit date: 01/14/2020 Discharge date: 01/16/2020  Recommendations for Outpatient Follow-up:   Follow-up Information    Pearson Grippe, MD. Schedule an appointment as soon as possible for a visit in 1 week(s).   Specialty: Internal Medicine Why: To be seen with repeat labs (CBC & BMP). Contact information: [address] [place] [address] [phone]        [name], MD. Schedule an appointment as soon as possible for a visit in 1 week(s).   Specialties: Cardiology, Radiology Contact information: [address] [place] [address] [place] [address] [phone]            Home Health: PT Equipment/Devices: Home oxygen at 2 L/min continuously.  Discharge Condition: Improved and stable CODE STATUS: Full Diet recommendation: Heart healthy & diabetic diet.  Discharge Diagnoses:  Principal Problem:   CHF exacerbation (HCC) Active Problems:   HTN (hypertension)   AF (paroxysmal atrial fibrillation) (HCC)   CAP (community acquired pneumonia)   Acute respiratory failure (HCC)   Elevated troponin   Brief Summary: 79 year old female, lives with her family, independent of activities, not on home oxygen PTA, PMH of paroxysmal atrial fibrillation on Eliquis, type II DM, HTN, HLD, obesity, chronic diastolic CHF presented to ED with complaints of dyspnea, cough, nausea, vomiting and epigastric pain.  In the ED, afebrile, RR 18-26, BP 182/69, saturating 89% on room air, WBC 10.8.  Hemoglobin 10.1.  BUN 21, creatinine 0.85, glucose 176, troponin 16>18 and BNP 98.4.  CT abdomen and pelvis significant for moderate bilateral effusions.  She was admitted for acute respiratory failure with hypoxia due to decompensated CHF and possible pneumonia.  Assessment and plan:  1. Acute respiratory failure with hypoxia: Patient reports  that she was not on home oxygen prior to admission.  Likely related to decompensated CHF, bilateral pleural effusions and atelectasis.  Reportedly was saturating at 70% on room air per EMS.  Initial consideration was for decompensated CHF and possible pneumonia.  Improved with treatment but still hypoxic with activity.  Qualified for home oxygen.  Close outpatient follow-up with PCP and cardiology.  Incentive spirometry encouraged. 2. Acute on chronic diastolic CHF: TTE 12/05/2019 with EF of 60-65%, indeterminate diastolic functions, moderate LAE.  She presented with respiratory symptoms, JVD and bibasilar crackles.  She was treated with IV Lasix 40 mg twice daily.  -1.5 L since admission but not clear if intake and output were totally accurate.  Weight only down by about 1 pound again likely inaccurate.  Transition to slightly higher than prior home dose of Lasix at discharge with close outpatient follow-up with her PCP and cardiologist for further management.  Improved. 3. Suspected pneumonia: After careful clinical consideration, eventually felt that her presentation was more due to decompensated CHF rather than pneumonia.  Procalcitonin was negative.  Empirically started antibiotics were discontinued. 4. Moderate bilateral pleural effusions: Likely due to CHF.  Diuresis as noted above.  Repeat chest x-ray today shows small left pleural effusion.  Mild bibasilar atelectasis.  Right pleural effusion seen on recent CT not appreciable by x-ray.  Lungs otherwise clear.  Periodically follow chest x-ray as outpatient. 5. Paroxysmal A. fib: S/p DCCV on 6/1. CHA2DS2-VASc score 5.  Continue prior home dose of amiodarone, carvedilol and Eliquis.  Close follow-up with outpatient cardiology. 6. Type II DM with hyperglycemia: A1c 6.9 on 4/22.  Continue PTA  Amaryl, Metformin and Trulicity. 7. Essential hypertension: BP 182/69 on presentation.  Losartan was temporarily allowed for IV diuresis in the hospital.  Continue  prior home dose of Coreg and losartan at discharge. 8. Nausea vomiting and abdominal pain: Unclear etiology.?  Related to decompensated CHF.  CT abdomen without acute findings.  Resolved. 9. Anemia: Suspect of chronic disease.  Stable.  Outpatient follow-up. 10. Hypokalemia and hypomagnesemia: Replaced prior to discharge.  Outpatient follow-up.   Consultations:  None  Procedures:  None   Discharge Instructions  Discharge Instructions    (HEART FAILURE PATIENTS) Call MD:  Anytime you have any of the following symptoms: 1) 3 pound weight gain in 24 hours or 5 pounds in 1 week 2) shortness of breath, with or without a dry hacking cough 3) swelling in the hands, feet or stomach 4) if you have to sleep on extra pillows at night in order to breathe.   Complete by: As directed    Call MD for:  difficulty breathing, headache or visual disturbances   Complete by: As directed    Call MD for:  extreme fatigue   Complete by: As directed    Call MD for:  persistant dizziness or light-headedness   Complete by: As directed    Call MD for:  persistant nausea and vomiting   Complete by: As directed    Call MD for:  severe uncontrolled pain   Complete by: As directed    Call MD for:  temperature >100.4   Complete by: As directed    Diet - low sodium heart healthy   Complete by: As directed    Diet Carb Modified   Complete by: As directed    Increase activity slowly   Complete by: As directed        Medication List    TAKE these medications   acetaminophen 500 MG tablet Commonly known as: TYLENOL Take 500 mg by mouth every 8 (eight) hours as needed for moderate pain or headache.   amiodarone 200 MG tablet Commonly known as: PACERONE Take 1 tablet (200 mg total) by mouth daily.   apixaban 5 MG Tabs tablet Commonly known as: Eliquis Take 1 tablet (5 mg total) by mouth 2 (two) times daily.   B-12 2500 MCG Tabs Take 2,500 mcg by mouth daily with lunch.   carvedilol 3.125 MG  tablet Commonly known as: COREG Take 3.125 mg by mouth 2 (two) times daily.   furosemide 20 MG tablet Commonly known as: Lasix Take 1-2 tablets (20-40 mg total) by mouth 2 (two) times daily. Take 2 tablets (40 mg total) in the mornings and 1 tablet (20 mg total) in the evenings. What changed:   how much to take  additional instructions   glimepiride 4 MG tablet Commonly known as: AMARYL Take 1 tablet (4 mg total) by mouth daily with breakfast.   losartan 100 MG tablet Commonly known as: COZAAR Take 100 mg by mouth daily.   metFORMIN 1000 MG tablet Commonly known as: GLUCOPHAGE Take 1,000 mg by mouth 2 (two) times daily with a meal.   pantoprazole 40 MG tablet Commonly known as: Protonix Take 1 tablet (40 mg total) by mouth daily. What changed: when to take this   potassium chloride SA 20 MEQ tablet Commonly known as: KLOR-CON Take 1 tablet (20 mEq total) by mouth daily. What changed: when to take this   Trulicity 1.5 UY/4.0HK Sopn Generic drug: Dulaglutide Inject 1.5 mg into the skin every Friday.  Vitamin D-3 125 MCG (5000 UT) Tabs Take 5,000 Units by mouth daily with lunch.      Allergies  Allergen Reactions  . Penicillins Anaphylaxis    Did it involve swelling of the face/tongue/throat, SOB, or low BP? Yes Did it involve sudden or severe rash/hives, skin peeling, or any reaction on the inside of your mouth or nose? Unknown Did you need to seek medical attention at a hospital or doctor's office? Yes--inpatient when reaction occurred When did it last happen?1964 or 1965 If all above answers are "NO", may proceed with cephalosporin use.   . Tramadol Itching  . Codeine Nausea And Vomiting      Procedures/Studies: DG Chest 2 View  Result Date: 01/16/2020 CLINICAL DATA:  Pleural effusion.  Shortness of breath. EXAM: CHEST - 2 VIEW COMPARISON:  January 14, 2020 chest radiograph as well as January 14, 2020 abdominal CT including lung bases. FINDINGS: Small left  pleural effusion. No pleural effusion appreciable by radiography on the right. There is mild right base atelectasis. No edema or airspace opacity. Heart size and pulmonary vascularity are normal. No adenopathy. No bone lesions. IMPRESSION: Small left pleural effusion. Mild bibasilar atelectasis. Right pleural effusion seen on recent CT not appreciable by radiography. Lungs otherwise clear. Cardiac silhouette within normal limits. Electronically Signed   By: Bretta Bang III M.D.   On: 01/16/2020 08:50   CT ABDOMEN PELVIS W CONTRAST  Result Date: 01/14/2020 CLINICAL DATA:  Abdominal pain, recent cardioversion, new onset shortness of breath, diffuse abdominal pain EXAM: CT ABDOMEN AND PELVIS WITH CONTRAST TECHNIQUE: Multidetector CT imaging of the abdomen and pelvis was performed using the standard protocol following bolus administration of intravenous contrast. CONTRAST:  OMNIPAQUE IOHEXOL 300 MG/ML  SOLN COMPARISON:  CT chest, 12/04/2019, CT abdomen pelvis, 11/07/2012 FINDINGS: Lower chest: Moderate bilateral pleural effusions and associated atelectasis or consolidation. Hepatobiliary: No solid liver abnormality is seen. No gallstones, gallbladder wall thickening, or biliary dilatation. Pancreas: Unremarkable. No pancreatic ductal dilatation or surrounding inflammatory changes. Spleen: Normal in size without significant abnormality. Adrenals/Urinary Tract: Small, benign fat containing left adrenal myelolipoma. Tiny nonobstructive calculus of the posterior midportion of the right kidney. Numerous bilateral parapelvic cysts. Bladder is unremarkable. Stomach/Bowel: Stomach is within normal limits. Appendix appears normal. No evidence of bowel wall thickening, distention, or inflammatory changes. Descending and sigmoid diverticulosis. Vascular/Lymphatic: Aortic atherosclerosis. No enlarged abdominal or pelvic lymph nodes. Reproductive: Status post hysterectomy Other: No abdominal wall hernia or  abnormality. No abdominopelvic ascites. Musculoskeletal: No acute or significant osseous findings. IMPRESSION: 1. No acute CT findings of the abdomen or pelvis to explain abdominal pain. 2. Moderate bilateral pleural effusions and associated atelectasis or consolidation. 3. Tiny nonobstructive right renal calculus.  No hydronephrosis. 4. Descending and sigmoid diverticulosis without evidence of diverticulitis. 5. Aortic Atherosclerosis (ICD10-I70.0). Electronically Signed   By: Lauralyn Primes M.D.   On: 01/14/2020 13:36   DG Chest Port 1 View  Result Date: 01/14/2020 CLINICAL DATA:  Shortness of breath.  Hypoxia.  Abdominal pain. EXAM: PORTABLE CHEST 1 VIEW COMPARISON:  12/05/2019 and 03/31/2019 FINDINGS: The heart size and pulmonary vascularity. Slight diffuse accentuation of the interstitial markings without discrete infiltrates or effusions. The interstitial accentuation is likely due to a shallow inspiration but could represent slight. No bone abnormality. IMPRESSION: Slight diffuse accentuation of the interstitial markings as described above. Electronically Signed   By: Francene Boyers M.D.   On: 01/14/2020 10:55       Subjective: Patient anxious to be discharged.  Denies complaints.  Denies dyspnea.  No nausea, vomiting or abdominal pain.  Tolerating diet.  Reports that she has been ambulating to the bathroom without difficulty.  Daughter at bedside.  Discharge Exam:  Vitals:   01/16/20 0121 01/16/20 0124 01/16/20 0438 01/16/20 0839  BP:  (!) 148/60 (!) 134/50 (!) 126/54  Pulse:  65 63 (!) 59  Resp:  19 19 18   Temp:  99.2 F (37.3 C) 98.5 F (36.9 C) 98 F (36.7 C)  TempSrc:  Oral Oral Oral  SpO2:  92% 99% 95%  Weight: 69.1 kg     Height:        General: Pleasant elderly female, moderately built and overweight sitting up comfortably in chair without distress. Cardiovascular: S1 and S2 heard, RRR.  No JVD, murmurs or pedal edema.  Telemetry personally reviewed: Sinus bradycardia in  the high 50s to sinus rhythm in the 60s/70s. Respiratory: Clear to auscultation without wheezing, rhonchi or crackles. No increased work of breathing. Abdominal:  Non distended, non tender & soft. No organomegaly or masses appreciated. Normal bowel sounds heard. CNS: Alert and oriented. No focal deficits. Extremities: no edema, no cyanosis    The results of significant diagnostics from this hospitalization (including imaging, microbiology, ancillary and laboratory) are listed below for reference.     Microbiology: Recent Results (from the past 240 hour(s))  SARS CORONAVIRUS 2 (TAT 6-24 HRS) Nasopharyngeal Nasopharyngeal Swab     Status: None   Collection Time: 01/10/20  1:48 PM   Specimen: Nasopharyngeal Swab  Result Value Ref Range Status   SARS Coronavirus 2 NEGATIVE NEGATIVE Final    Comment: (NOTE) SARS-CoV-2 target nucleic acids are NOT DETECTED. The SARS-CoV-2 RNA is generally detectable in upper and lower respiratory specimens during the acute phase of infection. Negative results do not preclude SARS-CoV-2 infection, do not rule out co-infections with other pathogens, and should not be used as the sole basis for treatment or other patient management decisions. Negative results must be combined with clinical observations, patient history, and epidemiological information. The expected result is Negative. Fact Sheet for Patients: HairSlick.nohttps://www.fda.gov/media/138098/download Fact Sheet for Healthcare Providers: quierodirigir.comhttps://www.fda.gov/media/138095/download This test is not yet approved or cleared by the [place] FDA and  has been authorized for detection and/or diagnosis of SARS-CoV-2 by FDA under an Emergency Use Authorization (EUA). This EUA will remain  in effect (meaning this test can be used) for the duration of the COVID-19 declaration under Section 56 4(b)(1) of the Act, 21 U.S.C. section 360bbb-3(b)(1), unless the authorization is terminated or revoked  sooner. Performed at [facility], [address] [place], [place] [address]   SARS Coronavirus 2 by RT PCR (hospital order, performed in [facility] hospital lab) Nasopharyngeal Nasopharyngeal Swab     Status: None   Collection Time: 01/14/20 10:33 AM   Specimen: Nasopharyngeal Swab  Result Value Ref Range Status   SARS Coronavirus 2 NEGATIVE NEGATIVE Final    Comment: (NOTE) SARS-CoV-2 target nucleic acids are NOT DETECTED. The SARS-CoV-2 RNA is generally detectable in upper and lower respiratory specimens during the acute phase of infection. The lowest concentration of SARS-CoV-2 viral copies this assay can detect is 250 copies / mL. A negative result does not preclude SARS-CoV-2 infection and should not be used as the sole basis for treatment or other patient management decisions.  A negative result may occur with improper specimen collection / handling, submission of specimen other than nasopharyngeal swab, presence of viral mutation(s) within the areas targeted by  this assay, and inadequate number of viral copies (<250 copies / mL). A negative result must be combined with clinical observations, patient history, and epidemiological information. Fact Sheet for Patients:   BoilerBrush.com.cy Fact Sheet for Healthcare Providers: https://pope.com/ This test is not yet approved or cleared  by the [place] FDA and has been authorized for detection and/or diagnosis of SARS-CoV-2 by FDA under an Emergency Use Authorization (EUA).  This EUA will remain in effect (meaning this test can be used) for the duration of the COVID-19 declaration under Section 564(b)(1) of the Act, 21 U.S.C. section 360bbb-3(b)(1), unless the authorization is terminated or revoked sooner. Performed at [facility], [address], [place], [place] [address]      Labs: CBC: Recent Labs  Lab 01/14/20 1025 01/16/20 0536  WBC 10.8* 7.8  HGB  10.1* 9.1*  HCT 35.4* 30.5*  MCV 92.7 90.2  PLT 277 229    Basic Metabolic Panel: Recent Labs  Lab 01/14/20 1025 01/15/20 0358 01/16/20 0536  NA 137 134* 133*  K 3.8 3.5 3.3*  CL 99 96* 93*  CO2 25 29 31   GLUCOSE 176* 92 132*  BUN 21 17 14   CREATININE 0.85 0.80 0.81  CALCIUM 8.4* 7.9* 8.0*  MG  --  1.3* 1.6*  PHOS  --   --  2.6    Liver Function Tests: Recent Labs  Lab 01/14/20 1025 01/16/20 0536  AST 35  --   ALT 34  --   ALKPHOS 57  --   BILITOT 0.8  --   PROT 6.3*  --   ALBUMIN 3.3* 2.8*    CBG: Recent Labs  Lab 01/15/20 1117 01/15/20 1614 01/15/20 2110 01/16/20 0615 01/16/20 1105  GLUCAP 149* 154* 245* 119* 200*   I discussed in detail with patient's daughter at bedside, updated care and answered questions.  Time coordinating discharge: 40 minutes  SIGNED:  03/17/20, MD, FACP, [facility]. [facility]  To contact the attending provider between 7A-7P or the covering provider during after hours 7P-7A, please log into the web site [email or web address] and access using universal [place] password for that web site. If you do not have the password, please call the hospital operator.

## 2020-01-16 NOTE — TOC Transition Note (Addendum)
Transition of Care [facility]) - CM/SW Discharge Note   Patient Details  Name: Virginia Gilbert MRN: [id number] Date of Birth: 1941/06/10  Transition of Care [facility]) CM/SW Contact:  Leone Haven, RN Phone Number: 01/16/2020, 3:39 PM   Clinical Narrative:    NCM spoke with patient, she states she does not want HHPT at this time, she states she does not need oxygen, NCM informed her MD has ordered oxygen for her and she states well bring it on .  NCM informed Ian Malkin with [employer] of what patient said.  She previously told [employer] she did not want the oxygen.  They will deliver it to her prior to dc.   15:50 NCM notified  Patient left AMA without getting her oxygen, she did not want to wait for the oxygen. Will notify MD.    Final next level of care: Home/Self Care Barriers to Discharge: No Barriers Identified   Patient Goals and CMS Choice Patient states their goals for this hospitalization and ongoing recovery are:: get better   Choice offered to / list presented to : NA  Discharge Placement                       Discharge Plan and Services                DME Arranged: Oxygen DME Agency: [employer] Date DME Agency Contacted: 01/16/20 Time DME Agency Contacted: 1539 Representative spoke with at DME Agency: Ian Malkin HH Arranged: NA          Social Determinants of Health (SDOH) Interventions     Readmission Risk Interventions No flowsheet data found.

## 2020-01-16 NOTE — Progress Notes (Signed)
Weaning  O2 down to 1L. Spo2 95-94%. PT and mobility to walk with pt today to check ambulating Spo2.

## 2020-01-16 NOTE — Progress Notes (Signed)
  Mobility Specialist Criteria Algorithm Info.  SATURATION QUALIFICATIONS: (This note is used to comply with regulatory documentation for home oxygen)  Patient Saturations on Room Air at Rest = 88%  Patient Saturations on Room Air while Ambulating = n/a%  Patient Saturations on 2 Liters of oxygen while Ambulating = 98%  Please briefly explain why patient needs home oxygen: Patients oxygen saturation fluctuates 85-90% on room air. Attempted to ambulate with 1L of supplemental oxygen but was desaturating to low 80's. On 2LO2 patient did better maintaining a saturation >98%  Mobility Team:  HOB elevated: Activity: Ambulated in hall;Transferred:  Bed to chair (to chair after ambulation) Range of motion: Active;All extremities Level of assistance: Standby assist, set-up cues, supervision of patient - no hands on Assistive device: None (Declined front wheel walker) Minutes sitting in chair: No data recorded Minutes stood: 5 minutes Minutes ambulated: 5 minutes Distance ambulated (ft): 240 ft Mobility response: Tolerated well (Needed standing rest break x1, mild unsteadiness as fatigue and ambulation distance inceases) Bed Position: Chair (Recliner chair)  Patient tolerated ambulation well. Denied any dizziness/ lightheadedness or pain. Was slightly SOB requiring one standing rest break. Patient is unsteady on feet but declined use of walker/rollator as she doesn't use one at home.   01/16/2020 10:58 AM

## 2020-01-18 LAB — LEGIONELLA PNEUMOPHILA SEROGP 1 UR AG: L. pneumophila Serogp 1 Ur Ag: NEGATIVE

## 2020-01-19 NOTE — TOC Progression Note (Signed)
Transition of Care [facility]) - Progression Note    Patient Details  Name: Virginia Gilbert MRN: [id number] Date of Birth: August 01, 1941  Transition of Care [facility]) CM/SW Contact  Leone Haven, RN Phone Number: 01/19/2020, 2:23 PM  Clinical Narrative:     6/7- Notified by Ian Malkin with [employer], they tried to deliver the oxygen to patient's home and she refused the oxygen she would not take it.     Barriers to Discharge: No Barriers Identified  Expected Discharge Plan and Services           Expected Discharge Date: 01/16/20               DME Arranged: Oxygen DME Agency: [employer] Date DME Agency Contacted: 01/16/20 Time DME Agency Contacted: 1539 Representative spoke with at DME Agency: Beryl Meager Arranged: NA           Social Determinants of Health (SDOH) Interventions    Readmission Risk Interventions No flowsheet data found.

## 2020-01-20 ENCOUNTER — Other Ambulatory Visit: Payer: Self-pay | Admitting: Internal Medicine

## 2020-01-23 ENCOUNTER — Ambulatory Visit: Payer: Medicare Other | Admitting: Internal Medicine

## 2020-01-26 ENCOUNTER — Telehealth: Payer: Self-pay

## 2020-01-26 ENCOUNTER — Other Ambulatory Visit: Payer: Self-pay

## 2020-01-26 DIAGNOSIS — R251 Tremor, unspecified: Secondary | ICD-10-CM

## 2020-01-26 NOTE — Telephone Encounter (Signed)
Ordered and sent to [facility], attnArchie Patten at [phone].

## 2020-01-26 NOTE — Telephone Encounter (Signed)
-----   Message from Dawna Part sent at 01/23/2020 12:16 PM EDT ----- Regarding: DatScan Approval Hey!  Not sure which one of yall I need to let know but this patient has been approved for the DatScan at the hospital. One of yall I guess just needs to call and get it scheduled at [facility].   Thanks!

## 2020-01-28 DIAGNOSIS — J90 Pleural effusion, not elsewhere classified: Secondary | ICD-10-CM | POA: Diagnosis not present

## 2020-01-28 DIAGNOSIS — E119 Type 2 diabetes mellitus without complications: Secondary | ICD-10-CM | POA: Diagnosis not present

## 2020-01-28 DIAGNOSIS — R0602 Shortness of breath: Secondary | ICD-10-CM | POA: Diagnosis not present

## 2020-01-28 DIAGNOSIS — R0902 Hypoxemia: Secondary | ICD-10-CM | POA: Diagnosis not present

## 2020-01-28 DIAGNOSIS — Z794 Long term (current) use of insulin: Secondary | ICD-10-CM | POA: Diagnosis not present

## 2020-01-29 ENCOUNTER — Ambulatory Visit: Payer: Medicare Other | Admitting: Neurology

## 2020-02-09 DIAGNOSIS — R531 Weakness: Secondary | ICD-10-CM | POA: Diagnosis not present

## 2020-02-09 DIAGNOSIS — R42 Dizziness and giddiness: Secondary | ICD-10-CM | POA: Diagnosis not present

## 2020-02-12 DIAGNOSIS — I6522 Occlusion and stenosis of left carotid artery: Secondary | ICD-10-CM | POA: Diagnosis not present

## 2020-02-16 NOTE — Progress Notes (Signed)
Cardiology Office Note:    Date:  02/17/2020   ID:  Virginia Gilbert, DOB 04/26/1941, MRN [id number]  PCP:  Pearson GrippeKim, James, MD  Cardiologist:  No primary care provider on file.  Electrophysiologist:  None   Referring MD: Pearson GrippeKim, James, MD   Chief Complaint:   History of Present Illness:    Virginia Gilbert is a 79 y.o. female with a history of paroxysmal atrial fibrillation on Eliquis with CHA2DS2-VASc score of 5 and amiodarone, status post A. fib ablation on 12/02/2019 with ERAF and subsequent cardioversion January 13, 2020.  She was subsequently hospitalized for diastolic heart failure decompensation and hypoxia on January 14, 2020.  She was treated with IV Lasix.  She was not felt to have a pneumonia.   She presents today for concerns of several weeks of nausea with recent vomiting.  No hematemesis. 3-4 weeks nausea with vomiting, bitter taste like it's related to a medicine.   She cannot be certain if this temporally correlates with restarting amiodarone at the end of May or increasing carvedilol on June 28 with her primary care office.  Her carvedilol was increased to 6.25 mg twice daily.  After this up titration she began to have vomiting.  She does however note nausea over the past month but did not experience vomiting until after she increased the dose of carvedilol.  Fortunately, she appears euvolemic today and feels her dry weight is 141 pounds.  She has no lower extremity swelling and no dyspnea on exertion.  She is taking Lasix 20 mg daily.  Her blood pressure is markedly elevated today, however she has not taken her morning medications.  She shows me her blood pressure log which documents BP generally 125-140 mmHg systolic.  She denies chest pain, dyspnea on exertion, palpitations, PND, orthopnea, leg swelling.  Does describe nausea and vomiting.  Past Medical History:  Diagnosis Date  . Diabetes mellitus without complication (HCC)   . Hyperlipidemia   . Hypertension   . Mitral regurgitation     evaluated by TEE 09/2019  . Persistent atrial fibrillation [facility](HCC)     Past Surgical History:  Procedure Laterality Date  . ATRIAL FIBRILLATION ABLATION N/A 12/02/2019   Procedure: ATRIAL FIBRILLATION ABLATION;  Surgeon: Hillis RangeAllred, James, MD;  Location: [facility] INVASIVE CV LAB;  Service: Cardiovascular;  Laterality: N/A;  . BUBBLE STUDY  10/08/2019   Procedure: BUBBLE STUDY;  Surgeon: Parke PoissonAcharya, Chantia Amalfitano A, MD;  [facility];  Service: Cardiology;;  . CARDIOVERSION N/A 07/08/2019   Procedure: CARDIOVERSION;  Surgeon: Yates DecampGanji, Jay, MD;  [facility];  Service: Cardiovascular;  Laterality: N/A;  . CARDIOVERSION N/A 09/02/2019   Procedure: CARDIOVERSION;  Surgeon: Yates DecampGanji, Jay, MD;  [facility];  Service: Cardiovascular;  Laterality: N/A;  . CARDIOVERSION N/A 01/13/2020   Procedure: CARDIOVERSION;  Surgeon: Sande Rives'Neal, [place] Thomas, MD;  [facility];  Service: Cardiovascular;  Laterality: N/A;  . CATARACT EXTRACTION, BILATERAL    . PARTIAL HYSTERECTOMY     1977  . TEE WITHOUT CARDIOVERSION N/A 10/08/2019   Procedure: TRANSESOPHAGEAL ECHOCARDIOGRAM (TEE);  Surgeon: Parke PoissonAcharya, Rondal Vandevelde A, MD;  [facility];  Service: Cardiology;  Laterality: N/A;    Current Medications: Current Meds  Medication Sig  . acetaminophen (TYLENOL) 500 MG tablet Take 500 mg by mouth every 8 (eight) hours as needed for moderate pain or headache.   Marland Kitchen. amiodarone (PACERONE) 200 MG tablet Take 1 tablet (200 mg total) by mouth daily.  Marland Kitchen. apixaban (ELIQUIS) 5 MG TABS tablet Take  1 tablet (5 mg total) by mouth 2 (two) times daily.  . carvedilol (COREG) 3.125 MG tablet Take 3.125 mg by mouth 2 (two) times daily.   . Cholecalciferol (VITAMIN D-3) 125 MCG (5000 UT) TABS Take 5,000 Units by mouth daily with lunch.   . Cyanocobalamin (B-12) 2500 MCG TABS Take 2,500 mcg by mouth daily with lunch.   . furosemide (LASIX) 20 MG tablet Take 1-2 tablets (20-40 mg total) by mouth 2 (two) times daily. Take 2  tablets (40 mg total) in the mornings and 1 tablet (20 mg total) in the evenings.  Marland Kitchen glimepiride (AMARYL) 4 MG tablet Take 1 tablet (4 mg total) by mouth daily with breakfast.  . losartan (COZAAR) 100 MG tablet Take 100 mg by mouth daily.   . metFORMIN (GLUCOPHAGE) 1000 MG tablet Take 1,000 mg by mouth 2 (two) times daily with a meal.   . pantoprazole (PROTONIX) 40 MG tablet Take 1 tablet by mouth once daily  . potassium chloride SA (KLOR-CON) 20 MEQ tablet Take 1 tablet (20 mEq total) by mouth daily. (Patient taking differently: Take 20 mEq by mouth daily with lunch. )  . TRULICITY 1.5 MG/0.5ML SOPN Inject 1.5 mg into the skin every Friday.      Allergies:   Penicillins, Tramadol, and Codeine   Social History   Socioeconomic History  . Marital status: Widowed    Spouse name: Not on file  . Number of children: 3  . Years of education: Not on file  . Highest education level: Not on file  Occupational History  . Not on file  Tobacco Use  . Smoking status: Former Smoker    Packs/day: 0.50    Years: 15.00    Pack years: 7.50    Types: Cigarettes    Quit date: 05/14/1985    Years since quitting: 34.7  . Smokeless tobacco: Never Used  Vaping Use  . Vaping Use: Never used  Substance and Sexual Activity  . Alcohol use: No    Alcohol/week: 0.0 standard drinks  . Drug use: Never  . Sexual activity: Not on file  Other Topics Concern  . Not on file  Social History Narrative  . Not on file   Social Determinants of Health   Financial Resource Strain:   . Difficulty of Paying Living Expenses:   Food Insecurity:   . Worried About Programme researcher, broadcasting/film/video in the Last Year:   . Barista in the Last Year:   Transportation Needs:   . Freight forwarder (Medical):   Marland Kitchen Lack of Transportation (Non-Medical):   Physical Activity:   . Days of Exercise per Week:   . Minutes of Exercise per Session:   Stress:   . Feeling of Stress :   Social Connections:   . Frequency of  Communication with Friends and Family:   . Frequency of Social Gatherings with Friends and Family:   . Attends Religious Services:   . Active Member of Clubs or Organizations:   . Attends Banker Meetings:   Marland Kitchen Marital Status:      Family History: The patient's family history includes Breast cancer in her daughter and daughter; Diabetes in her brother and mother; Emphysema in her brother and father; Heart disease in her brother, brother, and mother.  ROS:   Please see the history of present illness.    All other systems reviewed and are negative.  EKGs/Labs/Other Studies Reviewed:    The following studies were reviewed  today:  EKG:  NSR   Recent Labs: 12/22/2019: TSH 2.87 01/14/2020: ALT 34; B Natriuretic Peptide 98.4 01/16/2020: BUN 14; Creatinine, Ser 0.81; Hemoglobin 9.1; Magnesium 1.6; Platelets 229; Potassium 3.3; Sodium 133  Recent Lipid Panel No results found for: CHOL, TRIG, HDL, CHOLHDL, VLDL, LDLCALC, LDLDIRECT  Physical Exam:    VS:  BP (!) 186/65   Pulse 64   Ht 4\' 11"  (1.499 m)   Wt 142 lb (64.4 kg)   SpO2 99%   BMI 28.68 kg/m     Wt Readings from Last 5 Encounters:  02/17/20 142 lb (64.4 kg)  01/16/20 152 lb 6.4 oz (69.1 kg)  01/13/20 153 lb (69.4 kg)  01/05/20 156 lb 9.6 oz (71 kg)  12/31/19 159 lb 12.8 oz (72.5 kg)     Constitutional: No acute distress Eyes: sclera non-icteric, normal conjunctiva and lids ENMT: Mask in place Cardiovascular: regular rhythm, normal rate, no murmurs. S1 and S2 normal. Radial pulses normal bilaterally. No jugular venous distention.  Respiratory: clear to auscultation bilaterally GI : normal bowel sounds, soft and nontender. No distention.   MSK: extremities warm, well perfused. No edema.  NEURO: grossly nonfocal exam, moves all extremities. PSYCH: alert and oriented x 3, normal mood and affect.   ASSESSMENT:    1. Medication management   2. Paroxysmal atrial fibrillation (HCC)   3. Atypical atrial  flutter (HCC)   4. Secondary hypercoagulable state (HCC)   5. Chronic heart failure with preserved ejection fraction (HCC)   6. Essential hypertension    PLAN:    Medication management - Plan: TSH, Comprehensive metabolic panel Non-intractable vomiting with nausea, unspecified vomiting type Paroxysmal atrial fibrillation (HCC) Atypical atrial flutter (HCC)  -She has been on amiodarone since the end of May, and recently at the end of June had an up titration of carvedilol.  She feels the vomiting started after uptitrating carvedilol.  We have participated in shared decision making regarding holding amiodarone, decreasing the dose of carvedilol, and other options.  It appears she is in sinus rhythm today and we have determined that we will return to her previous dose of carvedilol 3.125 mg twice daily until she follows up with A. fib clinic later this month.  I will obtain LFTs and TSH today since she is on amiodarone to exclude lab abnormalities as a source of her nausea.  She can discuss holding amiodarone with A. fib clinic when she sees them later this month if changes in carvedilol dosing did not benefit her symptoms.  She is in agreement with this plan.  Secondary hypercoagulable state (HCC)-continue Eliquis 5 mg twice daily.  Chronic heart failure with preserved ejection fraction (HCC) -she declined admission to the hospital at home program for heart failure management as an outpatient.  Fortunately she is euvolemic at this time.  Continue Lasix 20 mg daily.  Essential hypertension-decrease dose of carvedilol given symptoms of vomiting after dose up titration.  Monitor for improvement in symptoms.  Total time of encounter: 30 minutes total time of encounter, including 25 minutes spent in face-to-face patient care on the date of this encounter. This time includes coordination of care and counseling regarding above mentioned problem list. Remainder of non-face-to-face time involved reviewing  chart documents/testing relevant to the patient encounter and documentation in the medical record. I have independently reviewed documentation from referring provider.   July, MD [place]  [facility]    Medication Adjustments/Labs and Tests Ordered: Current medicines are reviewed at length with  the patient today.  Concerns regarding medicines are outlined above.  Orders Placed This Encounter  Procedures  . TSH  . Comprehensive metabolic panel   No orders of the defined types were placed in this encounter.   Patient Instructions  Medication Instructions:  Decrease Carvedilol to 3.125 mg twice a day  *If you need a refill on your cardiac medications before your next appointment, please call your pharmacy*   Lab Work: Your physician recommends that you return for lab work today ( CMP, TSH)  If you have labs (blood work) drawn today and your tests are completely normal, you will receive your results only by: Marland Kitchen MyChart Message (if you have MyChart) OR . A paper copy in the mail If you have any lab test that is abnormal or we need to change your treatment, we will call you to review the results.   Testing/Procedures: None   Follow-Up: At [facility], you and your health needs are our priority.  As part of our continuing mission to provide you with exceptional heart care, we have created designated Provider Care Teams.  These Care Teams include your primary Cardiologist (physician) and Advanced Practice Providers (APPs -  Physician Assistants and Nurse Practitioners) who all work together to provide you with the care you need, when you need it.  We recommend signing up for the patient portal called "MyChart".  Sign up information is provided on this After Visit Summary.  MyChart is used to connect with patients for Virtual Visits (Telemedicine).  Patients are able to view lab/test results, encounter notes, upcoming appointments, etc.  Non-urgent messages can be  sent to your provider as well.   To learn more about what you can do with MyChart, go to [email or web address].    Your next appointment:   6 week(s)  The format for your next appointment:   In Person  Provider:   Weston Brass, MD

## 2020-02-17 ENCOUNTER — Other Ambulatory Visit: Payer: Self-pay

## 2020-02-17 ENCOUNTER — Encounter: Payer: Self-pay | Admitting: Internal Medicine

## 2020-02-17 ENCOUNTER — Ambulatory Visit: Payer: Medicare Other | Admitting: Internal Medicine

## 2020-02-17 VITALS — BP 186/65 | HR 64 | Ht 59.0 in | Wt 142.0 lb

## 2020-02-17 DIAGNOSIS — I10 Essential (primary) hypertension: Secondary | ICD-10-CM

## 2020-02-17 DIAGNOSIS — D6869 Other thrombophilia: Secondary | ICD-10-CM | POA: Diagnosis not present

## 2020-02-17 DIAGNOSIS — Z79899 Other long term (current) drug therapy: Secondary | ICD-10-CM

## 2020-02-17 DIAGNOSIS — I484 Atypical atrial flutter: Secondary | ICD-10-CM

## 2020-02-17 DIAGNOSIS — I480 Paroxysmal atrial fibrillation: Secondary | ICD-10-CM

## 2020-02-17 DIAGNOSIS — R112 Nausea with vomiting, unspecified: Secondary | ICD-10-CM | POA: Diagnosis not present

## 2020-02-17 DIAGNOSIS — I5032 Chronic diastolic (congestive) heart failure: Secondary | ICD-10-CM

## 2020-02-17 LAB — COMPREHENSIVE METABOLIC PANEL
ALT: 30 IU/L (ref 0–32)
AST: 30 IU/L (ref 0–40)
Albumin/Globulin Ratio: 1.6 (ref 1.2–2.2)
Albumin: 4.1 g/dL (ref 3.7–4.7)
Alkaline Phosphatase: 71 IU/L (ref 48–121)
BUN/Creatinine Ratio: 18 (ref 12–28)
BUN: 17 mg/dL (ref 8–27)
Bilirubin Total: 0.3 mg/dL (ref 0.0–1.2)
CO2: 26 mmol/L (ref 20–29)
Calcium: 9.6 mg/dL (ref 8.7–10.3)
Chloride: 97 mmol/L (ref 96–106)
Creatinine, Ser: 0.97 mg/dL (ref 0.57–1.00)
GFR calc Af Amer: 66 mL/min/{1.73_m2} (ref >59)
GFR calc non Af Amer: 57 mL/min/{1.73_m2} — ABNORMAL LOW (ref >59)
Globulin, Total: 2.5 g/dL (ref 1.5–4.5)
Glucose: 95 mg/dL (ref 65–99)
Potassium: 4.1 mmol/L (ref 3.5–5.2)
Sodium: 142 mmol/L (ref 134–144)
Total Protein: 6.6 g/dL (ref 6.0–8.5)

## 2020-02-17 LAB — TSH: TSH: 2.59 u[IU]/mL (ref 0.450–4.500)

## 2020-02-17 NOTE — Patient Instructions (Signed)
Medication Instructions:  Decrease Carvedilol to 3.125 mg twice a day  *If you need a refill on your cardiac medications before your next appointment, please call your pharmacy*   Lab Work: Your physician recommends that you return for lab work today ( CMP, TSH)  If you have labs (blood work) drawn today and your tests are completely normal, you will receive your results only by: Marland Kitchen MyChart Message (if you have MyChart) OR . A paper copy in the mail If you have any lab test that is abnormal or we need to change your treatment, we will call you to review the results.   Testing/Procedures: None   Follow-Up: At [facility], you and your health needs are our priority.  As part of our continuing mission to provide you with exceptional heart care, we have created designated Provider Care Teams.  These Care Teams include your primary Cardiologist (physician) and Advanced Practice Providers (APPs -  Physician Assistants and Nurse Practitioners) who all work together to provide you with the care you need, when you need it.  We recommend signing up for the patient portal called "MyChart".  Sign up information is provided on this After Visit Summary.  MyChart is used to connect with patients for Virtual Visits (Telemedicine).  Patients are able to view lab/test results, encounter notes, upcoming appointments, etc.  Non-urgent messages can be sent to your provider as well.   To learn more about what you can do with MyChart, go to [email or web address].    Your next appointment:   6 week(s)  The format for your next appointment:   In Person  Provider:   Weston Brass, MD

## 2020-02-24 ENCOUNTER — Other Ambulatory Visit: Payer: Self-pay | Admitting: Internal Medicine

## 2020-02-24 DIAGNOSIS — E041 Nontoxic single thyroid nodule: Secondary | ICD-10-CM

## 2020-02-25 ENCOUNTER — Ambulatory Visit
Admission: RE | Admit: 2020-02-25 | Discharge: 2020-02-25 | Disposition: A | Discharge status: Home / Self Care | Payer: Medicare Other | Source: Ambulatory Visit | Attending: Internal Medicine | Admitting: Internal Medicine

## 2020-02-25 DIAGNOSIS — E042 Nontoxic multinodular goiter: Secondary | ICD-10-CM | POA: Diagnosis not present

## 2020-02-25 DIAGNOSIS — E041 Nontoxic single thyroid nodule: Secondary | ICD-10-CM

## 2020-02-26 ENCOUNTER — Other Ambulatory Visit: Payer: Self-pay

## 2020-02-26 ENCOUNTER — Encounter (HOSPITAL_COMMUNITY)
Admission: RE | Admit: 2020-02-26 | Discharge: 2020-02-26 | Disposition: A | Discharge status: Home / Self Care | Payer: Medicare Other | Source: Ambulatory Visit | Attending: Neurology | Admitting: Neurology

## 2020-02-26 ENCOUNTER — Ambulatory Visit (HOSPITAL_COMMUNITY)
Admission: RE | Admit: 2020-02-26 | Discharge: 2020-02-26 | Disposition: A | Discharge status: Home / Self Care | Payer: Medicare Other | Source: Ambulatory Visit | Attending: Neurology | Admitting: Neurology

## 2020-02-26 DIAGNOSIS — R251 Tremor, unspecified: Secondary | ICD-10-CM | POA: Diagnosis not present

## 2020-02-26 MED ORDER — IODINE STRONG (LUGOLS) 5 % PO SOLN: 0.8000 mL | Freq: Once | ORAL | Status: DC

## 2020-02-26 MED ORDER — IODINE STRONG (LUGOLS) 5 % PO SOLN
ORAL | Status: AC
  Filled 2020-02-26: qty 1

## 2020-02-26 MED ORDER — IOFLUPANE I 123 185 MBQ/2.5ML IV SOLN
4.2500 | Freq: Once | INTRAVENOUS | Status: AC
  Administered 2020-02-26: 4.25 via INTRAVENOUS

## 2020-03-01 ENCOUNTER — Encounter: Payer: Self-pay | Admitting: Internal Medicine

## 2020-03-01 ENCOUNTER — Ambulatory Visit: Payer: Medicare Other | Admitting: Internal Medicine

## 2020-03-01 ENCOUNTER — Other Ambulatory Visit: Payer: Self-pay

## 2020-03-01 VITALS — BP 170/80 | HR 73 | Ht 59.0 in | Wt 149.8 lb

## 2020-03-01 DIAGNOSIS — I4819 Other persistent atrial fibrillation: Secondary | ICD-10-CM | POA: Diagnosis not present

## 2020-03-01 DIAGNOSIS — D6869 Other thrombophilia: Secondary | ICD-10-CM | POA: Diagnosis not present

## 2020-03-01 DIAGNOSIS — I340 Nonrheumatic mitral (valve) insufficiency: Secondary | ICD-10-CM

## 2020-03-01 DIAGNOSIS — I10 Essential (primary) hypertension: Secondary | ICD-10-CM | POA: Diagnosis not present

## 2020-03-01 MED ORDER — CARVEDILOL 6.25 MG PO TABS
6.2500 mg | ORAL_TABLET | Freq: Two times a day (BID) | ORAL | 3 refills | Status: DC
Start: 2020-03-01 — End: 2021-08-10

## 2020-03-01 NOTE — Patient Instructions (Addendum)
Medication Instructions:  Your physician has recommended you make the following change in your medication:  1 Stop Protonix 2 Stop Amiodarone 3 Increase your Carvedilol 6.25mg . Take 1 tablet by mouth twice a day *If you need a refill on your cardiac medications before your next appointment, please call your pharmacy*  Lab Work: None ordered.  If you have labs (blood work) drawn today and your tests are completely normal, you will receive your results only by: Marland Kitchen MyChart Message (if you have MyChart) OR . A paper copy in the mail If you have any lab test that is abnormal or we need to change your treatment, we will call you to review the results.  Testing/Procedures: None ordered.  Follow-Up: At [facility], you and your health needs are our priority.  As part of our continuing mission to provide you with exceptional heart care, we have created designated Provider Care Teams.  These Care Teams include your primary Cardiologist (physician) and Advanced Practice Providers (APPs -  Physician Assistants and Nurse Practitioners) who all work together to provide you with the care you need, when you need it.  We recommend signing up for the patient portal called "MyChart".  Sign up information is provided on this After Visit Summary.  MyChart is used to connect with patients for Virtual Visits (Telemedicine).  Patients are able to view lab/test results, encounter notes, upcoming appointments, etc.  Non-urgent messages can be sent to your provider as well.   To learn more about what you can do with MyChart, go to [email or web address].    Your next appointment:   Your physician wants you to follow-up in: 3 months with Dr. Johney Frame. You will receive a reminder letter in the mail two months in advance. If you don't receive a letter, please call our office to schedule the follow-up appointment.  05/31/2020 at 1030 at the [address] office Other Instructions:

## 2020-03-01 NOTE — Progress Notes (Signed)
PCP: Pearson Grippe, MD Primary Cardiologist: Dr Druscilla Brownie Virginia Gilbert is a 79 y.o. female who presents today for routine electrophysiology followup.  Since his recent afib ablation, the patient reports doing reasonably well.  She had nausea which she attributed to protonix.  Multiple other somatic complaints.  appears to be maintaining sinus rhythm recently.  she denies procedure related complications and is pleased with the results of the procedure.  Today, she denies symptoms of palpitations, chest pain, shortness of breath,  lower extremity edema, dizziness, presyncope, or syncope.  The patient is otherwise without complaint today.   Past Medical History:  Diagnosis Date  . Diabetes mellitus without complication (HCC)   . Hyperlipidemia   . Hypertension   . Mitral regurgitation    evaluated by TEE 09/2019  . Persistent atrial fibrillation [facility])    Past Surgical History:  Procedure Laterality Date  . ATRIAL FIBRILLATION ABLATION N/A 12/02/2019   Procedure: ATRIAL FIBRILLATION ABLATION;  Surgeon: Hillis Range, MD;  Location: [facility] INVASIVE CV LAB;  Service: Cardiovascular;  Laterality: N/A;  . BUBBLE STUDY  10/08/2019   Procedure: BUBBLE STUDY;  Surgeon: [name], MD;  Location: [facility] ENDOSCOPY;  Service: Cardiology;;  . CARDIOVERSION N/A 07/08/2019   Procedure: CARDIOVERSION;  Surgeon: Yates Decamp, MD;  Location: [facility] ENDOSCOPY;  Service: Cardiovascular;  Laterality: N/A;  . CARDIOVERSION N/A 09/02/2019   Procedure: CARDIOVERSION;  Surgeon: Yates Decamp, MD;  Location: [facility] ENDOSCOPY;  Service: Cardiovascular;  Laterality: N/A;  . CARDIOVERSION N/A 01/13/2020   Procedure: CARDIOVERSION;  Surgeon: Sande Rives, MD;  Location: [facility] ENDOSCOPY;  Service: Cardiovascular;  Laterality: N/A;  . CATARACT EXTRACTION, BILATERAL    . PARTIAL HYSTERECTOMY     1977  . TEE WITHOUT CARDIOVERSION N/A 10/08/2019   Procedure: TRANSESOPHAGEAL ECHOCARDIOGRAM (TEE);  Surgeon: [name], MD;   Location: [facility] ENDOSCOPY;  Service: Cardiology;  Laterality: N/A;    ROS- all systems are personally reviewed and negatives except as per HPI above  Current Outpatient Medications  Medication Sig Dispense Refill  . acetaminophen (TYLENOL) 500 MG tablet Take 500 mg by mouth every 8 (eight) hours as needed for moderate pain or headache.     Marland Kitchen amiodarone (PACERONE) 200 MG tablet Take 1 tablet (200 mg total) by mouth daily. 30 tablet 3  . apixaban (ELIQUIS) 5 MG TABS tablet Take 1 tablet (5 mg total) by mouth 2 (two) times daily. 180 tablet 1  . carvedilol (COREG) 3.125 MG tablet Take 3.125 mg by mouth 2 (two) times daily.   12  . Cholecalciferol (VITAMIN D-3) 125 MCG (5000 UT) TABS Take 5,000 Units by mouth daily with lunch.     . Cyanocobalamin (B-12) 2500 MCG TABS Take 2,500 mcg by mouth daily with lunch.     . ezetimibe (ZETIA) 10 MG tablet Take 10 mg by mouth daily.    . furosemide (LASIX) 20 MG tablet Take 1-2 tablets (20-40 mg total) by mouth 2 (two) times daily. Take 2 tablets (40 mg total) in the mornings and 1 tablet (20 mg total) in the evenings. 120 tablet 0  . glimepiride (AMARYL) 4 MG tablet Take 1 tablet (4 mg total) by mouth daily with breakfast.  1  . LORazepam (ATIVAN) 0.5 MG tablet Take 0.5 mg by mouth 4 (four) times daily as needed.    Marland Kitchen losartan (COZAAR) 100 MG tablet Take 100 mg by mouth daily.   6  . metFORMIN (GLUCOPHAGE) 1000 MG tablet Take 1,000 mg by mouth  2 (two) times daily with a meal.   6  . omeprazole (PRILOSEC) 20 MG capsule Take 20 mg by mouth every morning.    . potassium chloride SA (KLOR-CON) 20 MEQ tablet Take 1 tablet (20 mEq total) by mouth daily. 30 tablet 6  . TRULICITY 1.5 MG/0.5ML SOPN Inject 1.5 mg into the skin every Friday.      No current facility-administered medications for this visit.   Facility-Administered Medications Ordered in Other Visits  Medication Dose Route Frequency Provider Last Rate Last Admin  . Iodine Strong (Lugols) 5 % solution  0.8 mL  0.8 mL Oral Once Tat, Rebecca S, DO      . Iodine Strong (Lugols) 5 % solution             Physical Exam: Vitals:   03/01/20 1141  BP: (!) 170/80  Pulse: 73  SpO2: 92%  Weight: 149 lb 12.8 oz (67.9 kg)  Height: 4\' 11"  (1.499 m)    GEN- The patient is well appearing, alert and oriented x 3 today.   Head- normocephalic, atraumatic Eyes-  Sclera clear, conjunctiva pink Ears- hearing intact Oropharynx- clear Lungs- normal work of breathing Heart- Regular rate and rhythm  GI- soft  Extremities- no clubbing, cyanosis, or edema  EKG tracing ordered today is personally reviewed and shows sinus rhythm,  Assessment and Plan:  1. Persistent atrial fibrillation Doing well s/p ablation She did have ERAF in June requiring cardioversion chads2vasc score is 4.  She is on eliquis Stop amiodarone  2. Hypertensive cardiovascular disease Very elevated BP today Increase coreg to 6.25mg  BID Adequate BP control is essential in maintaining sinus long term We discussed importance of lifestyle modification at length today  3. Chronic diastolic dysfunction Stable No change required today Sodium restriction is advised  4. Obesity Body mass index is 30.26 kg/m. Lifestyle modification advised  5. MR Continue to follow with serial echoes  Return to see me in 3 months  July MD, [facility] 03/01/2020 11:47 AM

## 2020-03-08 ENCOUNTER — Ambulatory Visit: Payer: Medicare Other | Admitting: Internal Medicine

## 2020-03-10 DIAGNOSIS — E118 Type 2 diabetes mellitus with unspecified complications: Secondary | ICD-10-CM | POA: Diagnosis not present

## 2020-03-10 DIAGNOSIS — E041 Nontoxic single thyroid nodule: Secondary | ICD-10-CM | POA: Diagnosis not present

## 2020-03-10 DIAGNOSIS — E782 Mixed hyperlipidemia: Secondary | ICD-10-CM | POA: Diagnosis not present

## 2020-03-11 ENCOUNTER — Ambulatory Visit: Payer: Medicare Other | Admitting: Cardiology

## 2020-03-17 DIAGNOSIS — R1013 Epigastric pain: Secondary | ICD-10-CM | POA: Diagnosis not present

## 2020-03-17 DIAGNOSIS — R1114 Bilious vomiting: Secondary | ICD-10-CM | POA: Diagnosis not present

## 2020-03-17 DIAGNOSIS — E118 Type 2 diabetes mellitus with unspecified complications: Secondary | ICD-10-CM | POA: Diagnosis not present

## 2020-03-17 DIAGNOSIS — K219 Gastro-esophageal reflux disease without esophagitis: Secondary | ICD-10-CM | POA: Diagnosis not present

## 2020-03-17 DIAGNOSIS — I10 Essential (primary) hypertension: Secondary | ICD-10-CM | POA: Diagnosis not present

## 2020-03-17 NOTE — Progress Notes (Signed)
Assessment/Plan:    1.  Tremor  -DaTscan was normal.  She does have a significant degree of rest tremor, although she complains more about intention tremor.  Nonetheless, we discussed various treatments.  On eliquis so cannot take primidone.  Already on coreg so cannot take propranolol.  Discussed 2nd line meds and she decided to hold and trial weighted gloves.   Information given including information on readi steadi.  Talked about weighted spoons/forks  2.  Diabetic peripheral neuropathy  -Safety discussed.  No further treatment necessary, besides for being aware of safety.  Subjective:   Virginia Gilbert was seen today in follow up for tremor.  My previous records were reviewed prior to todays visit. Pt had a DaTscan done since our last visit.  This was normal.  I personally reviewed that.  Tremor bothersome to patient when writing.  Tremor in both hands, R>L.  She is R handed. Current prescribed movement disorder medications: n/a   ALLERGIES:   Allergies  Allergen Reactions  . Penicillins Anaphylaxis    Did it involve swelling of the face/tongue/throat, SOB, or low BP? Yes Did it involve sudden or severe rash/hives, skin peeling, or any reaction on the inside of your mouth or nose? Unknown Did you need to seek medical attention at a hospital or doctor's office? Yes--inpatient when reaction occurred When did it last happen?1964 or 1965 If all above answers are "NO", may proceed with cephalosporin use.   . Tramadol Itching  . Codeine Nausea And Vomiting    CURRENT MEDICATIONS:  Outpatient Encounter Medications as of 03/19/2020  Medication Sig  . acetaminophen (TYLENOL) 500 MG tablet Take 500 mg by mouth every 8 (eight) hours as needed for moderate pain or headache.   Marland Kitchen amLODipine (NORVASC) 5 MG tablet Take 5 mg by mouth daily.  Marland Kitchen apixaban (ELIQUIS) 5 MG TABS tablet Take 1 tablet (5 mg total) by mouth 2 (two) times daily.  . carvedilol (COREG) 6.25 MG tablet Take 1  tablet (6.25 mg total) by mouth 2 (two) times daily.  . Cholecalciferol (VITAMIN D-3) 125 MCG (5000 UT) TABS Take 5,000 Units by mouth daily with lunch.   . Cyanocobalamin (B-12) 2500 MCG TABS Take 2,500 mcg by mouth daily with lunch.   . ezetimibe (ZETIA) 10 MG tablet Take 10 mg by mouth daily.  . furosemide (LASIX) 20 MG tablet Take 1-2 tablets (20-40 mg total) by mouth 2 (two) times daily. Take 2 tablets (40 mg total) in the mornings and 1 tablet (20 mg total) in the evenings.  Marland Kitchen glimepiride (AMARYL) 4 MG tablet Take 1 tablet (4 mg total) by mouth daily with breakfast.  . LORazepam (ATIVAN) 0.5 MG tablet Take 0.5 mg by mouth 4 (four) times daily as needed.  Marland Kitchen losartan (COZAAR) 100 MG tablet Take 100 mg by mouth daily.   . metFORMIN (GLUCOPHAGE) 1000 MG tablet Take 1,000 mg by mouth 2 (two) times daily with a meal.   . potassium chloride SA (KLOR-CON) 20 MEQ tablet Take 1 tablet (20 mEq total) by mouth daily.  . TRULICITY 1.5 MG/0.5ML SOPN Inject 1.5 mg into the skin every Friday.   . [DISCONTINUED] omeprazole (PRILOSEC) 20 MG capsule Take 20 mg by mouth every morning. (Patient not taking: Reported on 03/19/2020)   No facility-administered encounter medications on file as of 03/19/2020.     Objective:    PHYSICAL EXAMINATION:    VITALS:   Vitals:   03/19/20 1306  BP: (!) 173/67  Pulse:  68  SpO2: 94%  Weight: 144 lb (65.3 kg)  Height: 4\' 11"  (1.499 m)    GEN:  The patient appears stated age and is in NAD. HEENT:  Normocephalic, atraumatic.  The mucous membranes are moist. The superficial temporal arteries are without ropiness or tenderness. CV:  RRR Lungs:  CTAB Neck/HEME:  There are no carotid bruits bilaterally.  Neurological examination:  Orientation: The patient is alert and oriented x3. Cranial nerves: There is good facial symmetry. The speech is fluent and clear. Soft palate rises symmetrically and there is no tongue deviation. Hearing is intact to conversational  tone. Sensation: Sensation is intact to light touch throughout Motor: Strength is at least antigravity x4.  Movement examination: Tone: There is normal tone in the UE/LE Abnormal movements: There is right greater than left upper extremity rest tremor. Mild postural tremor in wing beating position on the L Coordination:  There is no decremation with RAM's Gait and Station: The patient ambulates well when leaving down hall I have reviewed and interpreted the following labs independently   Chemistry      Component Value Date/Time   NA 142 02/17/2020 0911   K 4.1 02/17/2020 0911   CL 97 02/17/2020 0911   CO2 26 02/17/2020 0911   BUN 17 02/17/2020 0911   CREATININE 0.97 02/17/2020 0911      Component Value Date/Time   CALCIUM 9.6 02/17/2020 0911   ALKPHOS 71 02/17/2020 0911   AST 30 02/17/2020 0911   ALT 30 02/17/2020 0911   BILITOT 0.3 02/17/2020 0911      Lab Results  Component Value Date   WBC 7.8 01/16/2020   HGB 9.1 (L) 01/16/2020   HCT 30.5 (L) 01/16/2020   MCV 90.2 01/16/2020   PLT 229 01/16/2020   Lab Results  Component Value Date   TSH 2.590 02/17/2020     Chemistry      Component Value Date/Time   NA 142 02/17/2020 0911   K 4.1 02/17/2020 0911   CL 97 02/17/2020 0911   CO2 26 02/17/2020 0911   BUN 17 02/17/2020 0911   CREATININE 0.97 02/17/2020 0911      Component Value Date/Time   CALCIUM 9.6 02/17/2020 0911   ALKPHOS 71 02/17/2020 0911   AST 30 02/17/2020 0911   ALT 30 02/17/2020 0911   BILITOT 0.3 02/17/2020 0911         Total time spent on today's visit was 20 minutes, including both face-to-face time and nonface-to-face time.  Time included that spent on review of records (prior notes available to me/labs/imaging if pertinent), discussing treatment and goals, answering patient's questions and coordinating care.  Cc:  04/19/2020, MD

## 2020-03-19 ENCOUNTER — Ambulatory Visit: Payer: Medicare Other | Admitting: Neurology

## 2020-03-19 ENCOUNTER — Other Ambulatory Visit: Payer: Self-pay

## 2020-03-19 ENCOUNTER — Encounter: Payer: Self-pay | Admitting: Neurology

## 2020-03-19 VITALS — BP 173/67 | HR 68 | Ht 59.0 in | Wt 144.0 lb

## 2020-03-19 DIAGNOSIS — R251 Tremor, unspecified: Secondary | ICD-10-CM | POA: Diagnosis not present

## 2020-03-19 NOTE — Patient Instructions (Signed)
1.  Try the weighted gloves off of amaz[employer].  Look under [employer] on [place].  If it doesn't help, we could try the RX brand called readi steadi 2.  If the above isn't helpful, then we can try the meds we discussed 3.  Trial the weighted spoons/forks at bed/bath/beyond 4.  Let us know if you need a follow up based on the above  The physicians and staff at [facility] are committed to providing excellent care. You may receive a survey requesting feedback about your experience at our office. We strive to receive "very good" responses to the survey questions. If you feel that your experience would prevent you from giving the office a "very good " response, please contact our office to try to remedy the situation. We may be reached at [phone]. Thank you for taking the time out of your busy day to complete the survey.

## 2020-03-25 DIAGNOSIS — K760 Fatty (change of) liver, not elsewhere classified: Secondary | ICD-10-CM | POA: Diagnosis not present

## 2020-03-25 DIAGNOSIS — K8689 Other specified diseases of pancreas: Secondary | ICD-10-CM | POA: Diagnosis not present

## 2020-03-25 DIAGNOSIS — R1013 Epigastric pain: Secondary | ICD-10-CM | POA: Diagnosis not present

## 2020-03-29 ENCOUNTER — Ambulatory Visit: Payer: Medicare Other | Admitting: Internal Medicine

## 2020-04-09 ENCOUNTER — Encounter: Payer: Self-pay | Admitting: Internal Medicine

## 2020-04-09 ENCOUNTER — Other Ambulatory Visit: Payer: Self-pay

## 2020-04-09 ENCOUNTER — Ambulatory Visit: Payer: Medicare Other | Admitting: Internal Medicine

## 2020-04-09 VITALS — BP 158/76 | HR 65 | Temp 96.8°F | Ht 59.0 in | Wt 142.0 lb

## 2020-04-09 DIAGNOSIS — I340 Nonrheumatic mitral (valve) insufficiency: Secondary | ICD-10-CM | POA: Diagnosis not present

## 2020-04-09 DIAGNOSIS — I4819 Other persistent atrial fibrillation: Secondary | ICD-10-CM

## 2020-04-09 DIAGNOSIS — Z79899 Other long term (current) drug therapy: Secondary | ICD-10-CM | POA: Diagnosis not present

## 2020-04-09 DIAGNOSIS — I10 Essential (primary) hypertension: Secondary | ICD-10-CM | POA: Diagnosis not present

## 2020-04-09 DIAGNOSIS — D6869 Other thrombophilia: Secondary | ICD-10-CM | POA: Diagnosis not present

## 2020-04-09 MED ORDER — SPIRONOLACTONE 25 MG PO TABS: 25.0000 mg | ORAL_TABLET | Freq: Every day | ORAL | 3 refills | Status: DC

## 2020-04-09 NOTE — Progress Notes (Signed)
Cardiology Office Note:    Date:  04/09/2020   ID:  Virginia Gilbert, DOB August 09, 1941, MRN [id number]  PCP:  Pearson Grippe, MD  Cardiologist:  No primary care provider on file.  Electrophysiologist:  None   Referring MD: Pearson Grippe, MD   Chief Complaint: Afib  History of Present Illness:    Virginia Gilbert is a 79 y.o. female with afib s/p ablation on Eliquis, and HFpEF.   She has no cardiovascular concerns. No recent SOB or leg swelling. No CP. She continues to have nausea and vomiting and is being evaluate for gallbladder pathology by her PCP.   Recently saw Dr. Johney Frame, amiodarone stopped. Maintaining sinus rhythm. Resumed carvedilol 6.25 mg BID, tolerating well. We discussed starting spironolactone 25 mg daily today for additional BP control.   Past Medical History:  Diagnosis Date  . Diabetes mellitus without complication (HCC)   . Hyperlipidemia   . Hypertension   . Mitral regurgitation    evaluated by TEE 09/2019  . Persistent atrial fibrillation [facility])     Past Surgical History:  Procedure Laterality Date  . ATRIAL FIBRILLATION ABLATION N/A 12/02/2019   Procedure: ATRIAL FIBRILLATION ABLATION;  Surgeon: Hillis Range, MD;  Location: [facility] INVASIVE CV LAB;  Service: Cardiovascular;  Laterality: N/A;  . BUBBLE STUDY  10/08/2019   Procedure: BUBBLE STUDY;  Surgeon: [name], MD;  Location: [facility] ENDOSCOPY;  Service: Cardiology;;  . CARDIOVERSION N/A 07/08/2019   Procedure: CARDIOVERSION;  Surgeon: Yates Decamp, MD;  Location: [facility] ENDOSCOPY;  Service: Cardiovascular;  Laterality: N/A;  . CARDIOVERSION N/A 09/02/2019   Procedure: CARDIOVERSION;  Surgeon: Yates Decamp, MD;  Location: [facility] ENDOSCOPY;  Service: Cardiovascular;  Laterality: N/A;  . CARDIOVERSION N/A 01/13/2020   Procedure: CARDIOVERSION;  Surgeon: Sande Rives, MD;  Location: [facility] ENDOSCOPY;  Service: Cardiovascular;  Laterality: N/A;  . CATARACT EXTRACTION, BILATERAL    . PARTIAL HYSTERECTOMY     1977  . TEE  WITHOUT CARDIOVERSION N/A 10/08/2019   Procedure: TRANSESOPHAGEAL ECHOCARDIOGRAM (TEE);  Surgeon: [name], MD;  Location: [facility] ENDOSCOPY;  Service: Cardiology;  Laterality: N/A;    Current Medications: Current Meds  Medication Sig  . acetaminophen (TYLENOL) 500 MG tablet Take 500 mg by mouth every 8 (eight) hours as needed for moderate pain or headache.   Marland Kitchen amLODipine (NORVASC) 5 MG tablet Take 5 mg by mouth daily.  Marland Kitchen apixaban (ELIQUIS) 5 MG TABS tablet Take 1 tablet (5 mg total) by mouth 2 (two) times daily.  . carvedilol (COREG) 6.25 MG tablet Take 1 tablet (6.25 mg total) by mouth 2 (two) times daily.  . Cholecalciferol (VITAMIN D-3) 125 MCG (5000 UT) TABS Take 5,000 Units by mouth daily with lunch.   . Cyanocobalamin (B-12) 2500 MCG TABS Take 2,500 mcg by mouth daily with lunch.   . ezetimibe (ZETIA) 10 MG tablet Take 10 mg by mouth daily.  . furosemide (LASIX) 20 MG tablet Take 1-2 tablets (20-40 mg total) by mouth 2 (two) times daily. Take 2 tablets (40 mg total) in the mornings and 1 tablet (20 mg total) in the evenings.  Marland Kitchen glimepiride (AMARYL) 4 MG tablet Take 1 tablet (4 mg total) by mouth daily with breakfast. (Patient taking differently: Take 4 mg by mouth 2 (two) times daily. 1 Tablet Twice Daily . 1 Tablet in the Morning ,1 Tablet in the Evening)  . LORazepam (ATIVAN) 0.5 MG tablet Take 0.5 mg by mouth 4 (four) times daily as needed.  Marland Kitchen losartan (COZAAR)  100 MG tablet Take 100 mg by mouth daily.   . metFORMIN (GLUCOPHAGE) 1000 MG tablet Take 1,000 mg by mouth 2 (two) times daily with a meal.   . TRULICITY 1.5 MG/0.5ML SOPN Inject 1.5 mg into the skin every Friday.   . [DISCONTINUED] potassium chloride SA (KLOR-CON) 20 MEQ tablet Take 1 tablet (20 mEq total) by mouth daily.     Allergies:   Penicillins, Tramadol, and Codeine   Social History   Socioeconomic History  . Marital status: Widowed    Spouse name: Not on file  . Number of children: 3  . Years of education:  Not on file  . Highest education level: Not on file  Occupational History  . Not on file  Tobacco Use  . Smoking status: Former Smoker    Packs/day: 0.50    Years: 15.00    Pack years: 7.50    Types: Cigarettes    Quit date: 05/14/1985    Years since quitting: 34.9  . Smokeless tobacco: Never Used  Vaping Use  . Vaping Use: Never used  Substance and Sexual Activity  . Alcohol use: No    Alcohol/week: 0.0 standard drinks  . Drug use: Never  . Sexual activity: Not on file  Other Topics Concern  . Not on file  Social History Narrative  . Not on file   Social Determinants of Health   Financial Resource Strain:   . Difficulty of Paying Living Expenses: Not on file  Food Insecurity:   . Worried About Programme researcher, broadcasting/film/video in the Last Year: Not on file  . Ran Out of Food in the Last Year: Not on file  Transportation Needs:   . Lack of Transportation (Medical): Not on file  . Lack of Transportation (Non-Medical): Not on file  Physical Activity:   . Days of Exercise per Week: Not on file  . Minutes of Exercise per Session: Not on file  Stress:   . Feeling of Stress : Not on file  Social Connections:   . Frequency of Communication with Friends and Family: Not on file  . Frequency of Social Gatherings with Friends and Family: Not on file  . Attends Religious Services: Not on file  . Active Member of Clubs or Organizations: Not on file  . Attends Banker Meetings: Not on file  . Marital Status: Not on file     Family History: The patient's family history includes Breast cancer in her daughter and daughter; Diabetes in her brother and mother; Emphysema in her brother and father; Heart disease in her brother, brother, and mother.  ROS:   Please see the history of present illness.    All other systems reviewed and are negative.  EKGs/Labs/Other Studies Reviewed:    The following studies were reviewed today:  EKG:  SR, rightward axis  Recent Labs: 01/14/2020: B  Natriuretic Peptide 98.4 01/16/2020: Hemoglobin 9.1; Magnesium 1.6; Platelets 229 02/17/2020: ALT 30; BUN 17; Creatinine, Ser 0.97; Potassium 4.1; Sodium 142; TSH 2.590  Recent Lipid Panel No results found for: CHOL, TRIG, HDL, CHOLHDL, VLDL, LDLCALC, LDLDIRECT  Physical Exam:    VS:  BP (!) 158/76   Pulse 65   Temp (!) 96.8 F (36 C)   Ht 4\' 11"  (1.499 m)   Wt 142 lb (64.4 kg)   SpO2 96%   BMI 28.68 kg/m     Wt Readings from Last 5 Encounters:  04/09/20 142 lb (64.4 kg)  03/19/20 144 lb (65.3 kg)  03/01/20 149 lb 12.8 oz (67.9 kg)  02/17/20 142 lb (64.4 kg)  01/16/20 152 lb 6.4 oz (69.1 kg)     Constitutional: No acute distress Eyes: sclera non-icteric, normal conjunctiva and lids ENMT: normal dentition, moist mucous membranes Cardiovascular: regular rhythm, normal rate, no murmurs. S1 and S2 normal. Radial pulses normal bilaterally. No jugular venous distention.  Respiratory: clear to auscultation bilaterally GI : normal bowel sounds, soft and nontender. No distention.   MSK: extremities warm, well perfused. No edema.  NEURO: grossly nonfocal exam, moves all extremities. PSYCH: alert and oriented x 3, normal mood and affect.   ASSESSMENT:    1. Essential hypertension   2. Persistent atrial fibrillation (HCC)   3. Secondary hypercoagulable state (HCC)   4. Medication management   5. Mild mitral regurgitation    PLAN:    Essential hypertension - Plan: spironolactone (ALDACTONE) 25 MG tablet - will start spironolactone and check labs in 7-10 days. Will stop potassium supplement while on spironolactone. This will help with BP and HFpEF  Persistent atrial fibrillation (HCC) - Plan: EKG 12-Lead - currently in sinus rhythm. Continue BB.  - amiodarone stopped by Dr. Johney Frame.  Secondary hypercoagulable state (HCC)  - continue eliquis 5 mg BID. No bleeding  Medication management - Plan: Basic metabolic panel - medications reviewed in detail and reconciled.   Mild mitral  regurgitation - will repeat echo in 2-3 years unless other indication to perform sooner.  Total time of encounter: 30 minutes total time of encounter, including 25 minutes spent in face-to-face patient care on the date of this encounter. This time includes coordination of care and counseling regarding above mentioned problem list. Remainder of non-face-to-face time involved reviewing chart documents/testing relevant to the patient encounter and documentation in the medical record. I have independently reviewed documentation from referring provider.   Weston Brass, MD [place]  [facility]    Medication Adjustments/Labs and Tests Ordered: Current medicines are reviewed at length with the patient today.  Concerns regarding medicines are outlined above.  Orders Placed This Encounter  Procedures  . Basic metabolic panel  . EKG 12-Lead   Meds ordered this encounter  Medications  . spironolactone (ALDACTONE) 25 MG tablet    Sig: Take 1 tablet (25 mg total) by mouth daily.    Dispense:  90 tablet    Refill:  3    Patient Instructions  Medication Instructions:  Stop Potassium 20 meq daily Start Spironolactone 25 mg daily  *If you need a refill on your cardiac medications before your next appointment, please call your pharmacy*   Lab Work: Your physician recommends that you return for lab work in 7-10 days ( BMP)  If you have labs (blood work) drawn today and your tests are completely normal, you will receive your results only by: Marland Kitchen MyChart Message (if you have MyChart) OR . A paper copy in the mail If you have any lab test that is abnormal or we need to change your treatment, we will call you to review the results.   Testing/Procedures: None   Follow-Up: At [facility], you and your health needs are our priority.  As part of our continuing mission to provide you with exceptional heart care, we have created designated Provider Care Teams.  These Care Teams include  your primary Cardiologist (physician) and Advanced Practice Providers (APPs -  Physician Assistants and Nurse Practitioners) who all work together to provide you with the care you need, when you need it.  We recommend signing up for the patient portal called "MyChart".  Sign up information is provided on this After Visit Summary.  MyChart is used to connect with patients for Virtual Visits (Telemedicine).  Patients are able to view lab/test results, encounter notes, upcoming appointments, etc.  Non-urgent messages can be sent to your provider as well.   To learn more about what you can do with MyChart, go to [email or web address].    Your next appointment:   4-6 week(s)  The format for your next appointment:   In Person  Provider:   Weston BrassGayatri Kaelyn Nauta, MD

## 2020-04-09 NOTE — Patient Instructions (Signed)
Medication Instructions:  Stop Potassium 20 meq daily Start Spironolactone 25 mg daily  *If you need a refill on your cardiac medications before your next appointment, please call your pharmacy*   Lab Work: Your physician recommends that you return for lab work in 7-10 days ( BMP)  If you have labs (blood work) drawn today and your tests are completely normal, you will receive your results only by: Marland Kitchen MyChart Message (if you have MyChart) OR . A paper copy in the mail If you have any lab test that is abnormal or we need to change your treatment, we will call you to review the results.   Testing/Procedures: None   Follow-Up: At [facility], you and your health needs are our priority.  As part of our continuing mission to provide you with exceptional heart care, we have created designated Provider Care Teams.  These Care Teams include your primary Cardiologist (physician) and Advanced Practice Providers (APPs -  Physician Assistants and Nurse Practitioners) who all work together to provide you with the care you need, when you need it.  We recommend signing up for the patient portal called "MyChart".  Sign up information is provided on this After Visit Summary.  MyChart is used to connect with patients for Virtual Visits (Telemedicine).  Patients are able to view lab/test results, encounter notes, upcoming appointments, etc.  Non-urgent messages can be sent to your provider as well.   To learn more about what you can do with MyChart, go to [email or web address].    Your next appointment:   4-6 week(s)  The format for your next appointment:   In Person  Provider:   Weston Brass, MD

## 2020-04-15 ENCOUNTER — Telehealth: Payer: Self-pay | Admitting: Internal Medicine

## 2020-04-15 DIAGNOSIS — I10 Essential (primary) hypertension: Secondary | ICD-10-CM

## 2020-04-15 MED ORDER — SPIRONOLACTONE 25 MG PO TABS: 12.5000 mg | ORAL_TABLET | Freq: Every day | ORAL | 3 refills | Status: DC

## 2020-04-15 NOTE — Telephone Encounter (Signed)
Please decrease spironolactone to 12.5mg  daily as recommended by Dr Jacques Navy (okay to take 25mg  every other day if unable to cut tablets).   May schedule follow up with pharmacist HTN clinic as needed.

## 2020-04-15 NOTE — Telephone Encounter (Signed)
° ° ° °  Pt c/o medication issue:  1. Name of Medication:   spironolactone (ALDACTONE) 25 MG tablet    2. How are you currently taking this medication (dosage and times per day)?   3. Are you having a reaction (difficulty breathing--STAT)?   4. What is your medication issue? Pt said since she start taking this medication her BP got really low,  BP 100/44 HR 44 100/47 HR 50 113/39 HR 47 130/61 HR 75 She said she did not take the medication today

## 2020-04-15 NOTE — Telephone Encounter (Signed)
Called and spoke to patient. She states that since she started the spironolactone 25 mg QD  That her BP has been running low and she feels very tired and fatigued. Denies having any lightheadedness, dizziness, syncope, or any other Sx.   BPs/HRs have been: 100/44 44, 100/47 50, 113/39 47, 130/61 75.  She is also taking lasix 20 mg BID, carvedilol 6.25 mg BID, losartan 100 mg QD, and amlodipine 5 mg QD. She states that she did not take the spironolactone today and her BP was 148/74 and HR 61. She states that she has been staying hydrated.  Will forward to Dr. Jacques Navy for review and recommendation.

## 2020-04-15 NOTE — Telephone Encounter (Signed)
Could she take half a tab of spironolactone and see how she does? I'm sending this to the pharmacist as well for review.   OK to hold spironolactone for now if she would prefer.

## 2020-04-15 NOTE — Telephone Encounter (Signed)
Called and spoke to the patient and instructed her to decrease spironolactone to 12.5 mg QD. She states that she has a pill cutter and will try that. She will continue to monitor her BP and Sx and call us back to be set up in the HTN clinic if needed.

## 2020-04-21 DIAGNOSIS — Z79899 Other long term (current) drug therapy: Secondary | ICD-10-CM | POA: Diagnosis not present

## 2020-04-21 LAB — BASIC METABOLIC PANEL
BUN/Creatinine Ratio: 25 (ref 12–28)
BUN: 25 mg/dL (ref 8–27)
CO2: 28 mmol/L (ref 20–29)
Calcium: 9.9 mg/dL (ref 8.7–10.3)
Chloride: 96 mmol/L (ref 96–106)
Creatinine, Ser: 1 mg/dL (ref 0.57–1.00)
GFR calc Af Amer: 63 mL/min/{1.73_m2} (ref >59)
GFR calc non Af Amer: 54 mL/min/{1.73_m2} — ABNORMAL LOW (ref >59)
Glucose: 98 mg/dL (ref 65–99)
Potassium: 5 mmol/L (ref 3.5–5.2)
Sodium: 140 mmol/L (ref 134–144)

## 2020-04-27 ENCOUNTER — Other Ambulatory Visit: Payer: Self-pay | Admitting: Internal Medicine

## 2020-05-14 ENCOUNTER — Ambulatory Visit: Payer: Medicare Other | Admitting: Internal Medicine

## 2020-05-21 ENCOUNTER — Telehealth: Payer: Self-pay

## 2020-05-21 MED ORDER — APIXABAN 5 MG PO TABS: 5.0000 mg | ORAL_TABLET | Freq: Two times a day (BID) | ORAL | 1 refills | Status: DC

## 2020-05-21 NOTE — Telephone Encounter (Signed)
Spoke with patient, results of recent blood work given. Patient verbalized understanding and will follow up with Dr. Jacques Navy in office 10/14. Patient also stated that she needs her eliquis refilled as she will be out before she returns for follow up. Advised patient I have sent the prescription refill for Eliquis into her pharmacy listed. Patient verbalized understanding.

## 2020-05-27 ENCOUNTER — Ambulatory Visit: Payer: Medicare Other | Admitting: Internal Medicine

## 2020-05-27 ENCOUNTER — Other Ambulatory Visit: Payer: Self-pay

## 2020-05-27 ENCOUNTER — Encounter: Payer: Self-pay | Admitting: Internal Medicine

## 2020-05-27 VITALS — BP 132/60 | HR 71 | Ht 59.0 in | Wt 148.4 lb

## 2020-05-27 DIAGNOSIS — Z79899 Other long term (current) drug therapy: Secondary | ICD-10-CM | POA: Diagnosis not present

## 2020-05-27 DIAGNOSIS — Z23 Encounter for immunization: Secondary | ICD-10-CM | POA: Diagnosis not present

## 2020-05-27 DIAGNOSIS — I10 Essential (primary) hypertension: Secondary | ICD-10-CM

## 2020-05-27 DIAGNOSIS — I340 Nonrheumatic mitral (valve) insufficiency: Secondary | ICD-10-CM

## 2020-05-27 DIAGNOSIS — I4819 Other persistent atrial fibrillation: Secondary | ICD-10-CM | POA: Diagnosis not present

## 2020-05-27 DIAGNOSIS — D6869 Other thrombophilia: Secondary | ICD-10-CM | POA: Diagnosis not present

## 2020-05-27 NOTE — Patient Instructions (Signed)
Medication Instructions:  STOP SPIRONALACTONE *If you need a refill on your cardiac medications before your next appointment, please call your pharmacy*  Lab Work: None Ordered At This Time.  If you have labs (blood work) drawn today and your tests are completely normal, you will receive your results only by: Marland Kitchen MyChart Message (if you have MyChart) OR . A paper copy in the mail If you have any lab test that is abnormal or we need to change your treatment, we will call you to review the results.  Testing/Procedures: None Ordered At This Time.   Follow-Up: At [facility], you and your health needs are our priority.  As part of our continuing mission to provide you with exceptional heart care, we have created designated Provider Care Teams.  These Care Teams include your primary Cardiologist (physician) and Advanced Practice Providers (APPs -  Physician Assistants and Nurse Practitioners) who all work together to provide you with the care you need, when you need it.   Your next appointment:   6 month(s)  The format for your next appointment:   In Person  Provider:   Weston Brass, MD

## 2020-05-27 NOTE — Progress Notes (Signed)
Cardiology Office Note:    Date:  05/27/2020   ID:  Virginia Gilbert, DOB 03-Feb-1941, MRN [id number]  PCP:  Virginia Grippe, MD  Cardiologist:  No primary care provider on file.  Electrophysiologist:  None   Referring MD: Virginia Grippe, MD   Chief Complaint/Reason for Referral: Afib, HFpEF  History of Present Illness:    Virginia Gilbert is a 79 y.o. female with a history of afib s/p ablation on Eliquis, and HFpEF.   We attempted to treat her HTN more aggressively but she did not tolerate spironolactone, which made her feel poorly. This will be stopped. We discussed home monitoring of BP.   She denies CP, SOB, palpitations, or syncope.  Past Medical History:  Diagnosis Date  . Diabetes mellitus without complication (HCC)   . Hyperlipidemia   . Hypertension   . Mitral regurgitation    evaluated by TEE 09/2019  . Persistent atrial fibrillation [facility])     Past Surgical History:  Procedure Laterality Date  . ATRIAL FIBRILLATION ABLATION N/A 12/02/2019   Procedure: ATRIAL FIBRILLATION ABLATION;  Surgeon: Virginia Range, MD;  [facility]: [facility] INVASIVE CV LAB;  Service: Cardiovascular;  Laterality: N/A;  . BUBBLE STUDY  10/08/2019   Procedure: BUBBLE STUDY;  Surgeon: Virginia Poisson, MD;  Location: [facility] ENDOSCOPY;  Service: Cardiology;;  . CARDIOVERSION N/A 07/08/2019   Procedure: CARDIOVERSION;  Surgeon: Virginia Decamp, MD;  Location: [facility] ENDOSCOPY;  Service: Cardiovascular;  Laterality: N/A;  . CARDIOVERSION N/A 09/02/2019   Procedure: CARDIOVERSION;  Surgeon: Virginia Decamp, MD;  Location: [facility] ENDOSCOPY;  Service: Cardiovascular;  Laterality: N/A;  . CARDIOVERSION N/A 01/13/2020   Procedure: CARDIOVERSION;  Surgeon: Virginia Rives, MD;  Location: [facility] ENDOSCOPY;  Service: Cardiovascular;  Laterality: N/A;  . CATARACT EXTRACTION, BILATERAL    . PARTIAL HYSTERECTOMY     1977  . TEE WITHOUT CARDIOVERSION N/A 10/08/2019   Procedure: TRANSESOPHAGEAL ECHOCARDIOGRAM (TEE);  Surgeon: Virginia Poisson, MD;   Location: [facility] ENDOSCOPY;  Service: Cardiology;  Laterality: N/A;    Current Medications: Current Meds  Medication Sig  . acetaminophen (TYLENOL) 500 MG tablet Take 500 mg by mouth every 8 (eight) hours as needed for moderate pain or headache.   Marland Kitchen amLODipine (NORVASC) 5 MG tablet Take 5 mg by mouth daily.  Marland Kitchen apixaban (ELIQUIS) 5 MG TABS tablet Take 1 tablet (5 mg total) by mouth 2 (two) times daily.  . carvedilol (COREG) 6.25 MG tablet Take 1 tablet (6.25 mg total) by mouth 2 (two) times daily.  . Cholecalciferol (VITAMIN D-3) 125 MCG (5000 UT) TABS Take 5,000 Units by mouth daily with lunch.   . Cyanocobalamin (B-12) 2500 MCG TABS Take 2,500 mcg by mouth daily with lunch.   . ezetimibe (ZETIA) 10 MG tablet Take 10 mg by mouth daily.  . furosemide (LASIX) 20 MG tablet Take 1-2 tablets (20-40 mg total) by mouth 2 (two) times daily. Take 2 tablets (40 mg total) in the mornings and 1 tablet (20 mg total) in the evenings.  Marland Kitchen glimepiride (AMARYL) 4 MG tablet Take 1 tablet (4 mg total) by mouth daily with breakfast. (Patient taking differently: Take 4 mg by mouth 2 (two) times daily. 1 Tablet Twice Daily . 1 Tablet in the Morning ,1 Tablet in the Evening)  . LORazepam (ATIVAN) 0.5 MG tablet Take 0.5 mg by mouth 4 (four) times daily as needed.  Marland Kitchen losartan (COZAAR) 100 MG tablet Take 100 mg by mouth daily.   . metFORMIN (GLUCOPHAGE) 1000 MG tablet  Take 1,000 mg by mouth 2 (two) times daily with a meal.   . TRULICITY 1.5 MG/0.5ML SOPN Inject 1.5 mg into the skin every Friday.   . [DISCONTINUED] spironolactone (ALDACTONE) 25 MG tablet Take 0.5 tablets (12.5 mg total) by mouth daily.     Allergies:   Penicillins, Tramadol, and Codeine   Social History   Tobacco Use  . Smoking status: Former Smoker    Packs/day: 0.50    Years: 15.00    Pack years: 7.50    Types: Cigarettes    Quit date: 05/14/1985    Years since quitting: 35.0  . Smokeless tobacco: Never Used  Vaping Use  . Vaping Use: Never  used  Substance Use Topics  . Alcohol use: No    Alcohol/week: 0.0 standard drinks  . Drug use: Never     Family History: The patient's family history includes Breast cancer in her daughter and daughter; Diabetes in her brother and mother; Emphysema in her brother and father; Heart disease in her brother, brother, and mother.  ROS:   Please see the history of present illness.    All other systems reviewed and are negative.  EKGs/Labs/Other Studies Reviewed:    The following studies were reviewed today:  Recent Labs: 01/14/2020: B Natriuretic Peptide 98.4 01/16/2020: Hemoglobin 9.1; Magnesium 1.6; Platelets 229 02/17/2020: ALT 30; TSH 2.590 04/21/2020: BUN 25; Creatinine, Ser 1.00; Potassium 5.0; Sodium 140  Recent Lipid Panel No results found for: CHOL, TRIG, HDL, CHOLHDL, VLDL, LDLCALC, LDLDIRECT  Physical Exam:    VS:  BP 132/60   Pulse 71   Ht 4\' 11"  (1.499 m)   Wt 148 lb 6.4 oz (67.3 kg)   SpO2 95%   BMI 29.97 kg/m     Wt Readings from Last 5 Encounters:  05/27/20 148 lb 6.4 oz (67.3 kg)  04/09/20 142 lb (64.4 kg)  03/19/20 144 lb (65.3 kg)  03/01/20 149 lb 12.8 oz (67.9 kg)  02/17/20 142 lb (64.4 kg)    Constitutional: No acute distress Eyes: sclera non-icteric, normal conjunctiva and lids ENMT: normal dentition, moist mucous membranes Cardiovascular: regular rhythm, normal rate, no murmurs. S1 and S2 normal. Radial pulses normal bilaterally. No jugular venous distention.  Respiratory: clear to auscultation bilaterally GI : normal bowel sounds, soft and nontender. No distention.   MSK: extremities warm, well perfused. No edema.  NEURO: grossly nonfocal exam, moves all extremities. PSYCH: alert and oriented x 3, normal mood and affect.   ASSESSMENT:    1. Essential hypertension   2. Persistent atrial fibrillation (HCC)   3. Secondary hypercoagulable state (HCC)   4. Medication management   5. Mild mitral regurgitation    PLAN:    We will stop spironolactone  and observe. Continue amlodipine, coreg, and losartan.  Mild MR, will follow clinically.   Continue Eliquis 5 mg BID for afib status post ablation. Chads2vasc is 4.   04/19/20, MD [place]  [facility]    Medication Adjustments/Labs and Tests Ordered: Current medicines are reviewed at length with the patient today.  Concerns regarding medicines are outlined above.   No orders of the defined types were placed in this encounter.   No orders of the defined types were placed in this encounter.   Patient Instructions  Medication Instructions:  STOP SPIRONALACTONE *If you need a refill on your cardiac medications before your next appointment, please call your pharmacy*  Lab Work: None Ordered At This Time.  If you have labs (blood work)  drawn today and your tests are completely normal, you will receive your results only by: Marland Kitchen MyChart Message (if you have MyChart) OR . A paper copy in the mail If you have any lab test that is abnormal or we need to change your treatment, we will call you to review the results.  Testing/Procedures: None Ordered At This Time.   Follow-Up: At [facility], you and your health needs are our priority.  As part of our continuing mission to provide you with exceptional heart care, we have created designated Provider Care Teams.  These Care Teams include your primary Cardiologist (physician) and Advanced Practice Providers (APPs -  Physician Assistants and Nurse Practitioners) who all work together to provide you with the care you need, when you need it.   Your next appointment:   6 month(s)  The format for your next appointment:   In Person  Provider:   Weston Brass, MD

## 2020-05-31 ENCOUNTER — Ambulatory Visit: Payer: Medicare Other | Admitting: Internal Medicine

## 2020-05-31 ENCOUNTER — Other Ambulatory Visit: Payer: Self-pay

## 2020-05-31 ENCOUNTER — Encounter: Payer: Self-pay | Admitting: Internal Medicine

## 2020-05-31 VITALS — BP 114/64 | HR 69 | Ht 59.0 in | Wt 150.6 lb

## 2020-05-31 DIAGNOSIS — D6869 Other thrombophilia: Secondary | ICD-10-CM

## 2020-05-31 DIAGNOSIS — I10 Essential (primary) hypertension: Secondary | ICD-10-CM | POA: Diagnosis not present

## 2020-05-31 DIAGNOSIS — I4819 Other persistent atrial fibrillation: Secondary | ICD-10-CM

## 2020-05-31 NOTE — Patient Instructions (Signed)
Medication Instructions:  Your physician recommends that you continue on your current medications as directed. Please refer to the Current Medication list given to you today.  *If you need a refill on your cardiac medications before your next appointment, please call your pharmacy*  Lab Work: None ordered.  If you have labs (blood work) drawn today and your tests are completely normal, you will receive your results only by: Marland Kitchen MyChart Message (if you have MyChart) OR . A paper copy in the mail If you have any lab test that is abnormal or we need to change your treatment, we will call you to review the results.  Testing/Procedures: None ordered.  Follow-Up: At [facility], you and your health needs are our priority.  As part of our continuing mission to provide you with exceptional heart care, we have created designated Provider Care Teams.  These Care Teams include your primary Cardiologist (physician) and Advanced Practice Providers (APPs -  Physician Assistants and Nurse Practitioners) who all work together to provide you with the care you need, when you need it.  We recommend signing up for the patient portal called "MyChart".  Sign up information is provided on this After Visit Summary.  MyChart is used to connect with patients for Virtual Visits (Telemedicine).  Patients are able to view lab/test results, encounter notes, upcoming appointments, etc.  Non-urgent messages can be sent to your provider as well.   To learn more about what you can do with MyChart, go to [email or web address].    Your next appointment:   Your physician wants you to follow-up in: 3 months with the Afib Clinic, they will contact you. 9 months with Dr. Johney Frame. You will receive a reminder letter in the mail two months in advance. If you don't receive a letter, please call our office to schedule the follow-up appointment.    Other Instructions:

## 2020-05-31 NOTE — Progress Notes (Signed)
PCP: Pearson Grippe, MD Primary Cardiologist: Dr Annamaria Helling Primary EP: Dr Johney Frame  [name] is a 79 y.o. female who presents today for routine electrophysiology followup.  Since last being seen in our clinic, the patient reports doing very well.  She has occasional nausea and fatigue of unclear etiology.  No afib events.   Today, she denies symptoms of palpitations, chest pain, shortness of breath,  lower extremity edema, dizziness, presyncope, or syncope.  The patient is otherwise without complaint today.   Past Medical History:  Diagnosis Date  . Diabetes mellitus without complication (HCC)   . Hyperlipidemia   . Hypertension   . Mitral regurgitation    evaluated by TEE 09/2019  . Persistent atrial fibrillation [facility])    Past Surgical History:  Procedure Laterality Date  . ATRIAL FIBRILLATION ABLATION N/A 12/02/2019   Procedure: ATRIAL FIBRILLATION ABLATION;  Surgeon: Hillis Range, MD;  Location: [facility] INVASIVE CV LAB;  Service: Cardiovascular;  Laterality: N/A;  . BUBBLE STUDY  10/08/2019   Procedure: BUBBLE STUDY;  Surgeon: [name], MD;  Location: [facility] ENDOSCOPY;  Service: Cardiology;;  . CARDIOVERSION N/A 07/08/2019   Procedure: CARDIOVERSION;  Surgeon: Yates Decamp, MD;  Location: [facility] ENDOSCOPY;  Service: Cardiovascular;  Laterality: N/A;  . CARDIOVERSION N/A 09/02/2019   Procedure: CARDIOVERSION;  Surgeon: Yates Decamp, MD;  Location: [facility] ENDOSCOPY;  Service: Cardiovascular;  Laterality: N/A;  . CARDIOVERSION N/A 01/13/2020   Procedure: CARDIOVERSION;  Surgeon: Sande Rives, MD;  Location: [facility] ENDOSCOPY;  Service: Cardiovascular;  Laterality: N/A;  . CATARACT EXTRACTION, BILATERAL    . PARTIAL HYSTERECTOMY     1977  . TEE WITHOUT CARDIOVERSION N/A 10/08/2019   Procedure: TRANSESOPHAGEAL ECHOCARDIOGRAM (TEE);  Surgeon: [name], MD;  Location: [facility] ENDOSCOPY;  Service: Cardiology;  Laterality: N/A;    ROS- all systems are reviewed and negatives except as per HPI  above  Current Outpatient Medications  Medication Sig Dispense Refill  . acetaminophen (TYLENOL) 500 MG tablet Take 500 mg by mouth every 8 (eight) hours as needed for moderate pain or headache.     Marland Kitchen amLODipine (NORVASC) 5 MG tablet Take 5 mg by mouth daily.    Marland Kitchen apixaban (ELIQUIS) 5 MG TABS tablet Take 1 tablet (5 mg total) by mouth 2 (two) times daily. 90 tablet 1  . carvedilol (COREG) 6.25 MG tablet Take 1 tablet (6.25 mg total) by mouth 2 (two) times daily. 180 tablet 3  . Cholecalciferol (VITAMIN D-3) 125 MCG (5000 UT) TABS Take 5,000 Units by mouth daily with lunch.     . Cyanocobalamin (B-12) 2500 MCG TABS Take 2,500 mcg by mouth daily with lunch.     . ezetimibe (ZETIA) 10 MG tablet Take 10 mg by mouth daily.    . furosemide (LASIX) 20 MG tablet Take 1-2 tablets (20-40 mg total) by mouth 2 (two) times daily. Take 2 tablets (40 mg total) in the mornings and 1 tablet (20 mg total) in the evenings. 120 tablet 0  . glimepiride (AMARYL) 4 MG tablet Take 1 tablet (4 mg total) by mouth daily with breakfast. (Patient taking differently: Take 4 mg by mouth 2 (two) times daily. 1 Tablet Twice Daily . 1 Tablet in the Morning ,1 Tablet in the Evening)  1  . LORazepam (ATIVAN) 0.5 MG tablet Take 0.5 mg by mouth 4 (four) times daily as needed.    Marland Kitchen losartan (COZAAR) 100 MG tablet Take 100 mg by mouth daily.   6  . metFORMIN (  GLUCOPHAGE) 1000 MG tablet Take 1,000 mg by mouth 2 (two) times daily with a meal.   6  . TRULICITY 1.5 MG/0.5ML SOPN Inject 1.5 mg into the skin every Friday.      No current facility-administered medications for this visit.    Physical Exam: Vitals:   05/31/20 1036  BP: 114/64  Pulse: 69  SpO2: 92%  Weight: 150 lb 9.6 oz (68.3 kg)  Height: 4\' 11"  (1.499 m)    GEN- The patient is well appearing, alert and oriented x 3 today.   Head- normocephalic, atraumatic Eyes-  Sclera clear, conjunctiva pink Ears- hearing intact Oropharynx- clear Lungs- Clear to ausculation  bilaterally, normal work of breathing Heart- Regular rate and rhythm, no murmurs, rubs or gallops, PMI not laterally displaced GI- soft, NT, ND, + BS Extremities- no clubbing, cyanosis, or edema  Wt Readings from Last 3 Encounters:  05/31/20 150 lb 9.6 oz (68.3 kg)  05/27/20 148 lb 6.4 oz (67.3 kg)  04/09/20 142 lb (64.4 kg)    EKG tracing ordered today is personally reviewed and shows sinus  Assessment and Plan:  1. Persistent afib Doing well s/p ablation.  Amiodarone has been discontinued chads2vasc score is 4.  She is on eliquis  2. HTN Stable No change required today  3. Chronic diastolic dysfunction Stable No change required today  4. MR Follows with Dr 04/11/20  5. overweight Body mass index is 30.42 kg/m. Lifestyle modification is advised  Return to AF clinic in 3 months Follow-up with Dr Annamaria Helling as scheduled  Risks, benefits and potential toxicities for medications prescribed and/or refilled reviewed with patient today.   Annamaria Helling MD, [facility] 05/31/2020 10:38 AM

## 2020-07-26 ENCOUNTER — Telehealth: Payer: Self-pay | Admitting: Internal Medicine

## 2020-07-26 DIAGNOSIS — M545 Low back pain, unspecified: Secondary | ICD-10-CM | POA: Diagnosis not present

## 2020-07-26 DIAGNOSIS — G8929 Other chronic pain: Secondary | ICD-10-CM | POA: Diagnosis not present

## 2020-07-26 MED ORDER — FUROSEMIDE 20 MG PO TABS: 20.0000 mg | ORAL_TABLET | Freq: Two times a day (BID) | ORAL | 3 refills | Status: DC

## 2020-07-26 NOTE — Telephone Encounter (Signed)
*  STAT* If patient is at the pharmacy, call can be transferred to refill team.   1. Which medications need to be refilled? (please list name of each medication and dose if known)  apixaban (ELIQUIS) 5 MG TABS tablet furosemide (LASIX) 20 MG tablet  2. Which pharmacy/location (including street and city if local pharmacy) is medication to be sent to? [id number] - [place],  - [address]  3. Do they need a 30 day or 90 day supply? 90 with refills

## 2020-07-26 NOTE — Telephone Encounter (Signed)
This is Dr. Acharya's pt ?

## 2020-08-06 ENCOUNTER — Other Ambulatory Visit: Payer: Self-pay | Admitting: Internal Medicine

## 2020-08-10 ENCOUNTER — Other Ambulatory Visit: Payer: Self-pay

## 2020-08-10 MED ORDER — APIXABAN 5 MG PO TABS: 5.0000 mg | ORAL_TABLET | Freq: Two times a day (BID) | ORAL | 1 refills | Status: DC

## 2020-08-10 NOTE — Telephone Encounter (Signed)
*  STAT* If patient is at the pharmacy, call can be transferred to refill team.   1. Which medications need to be refilled? (please list name of each medication and dose if known)  Eliquis  2. Which pharmacy/location (including street and city if local pharmacy) is medication to be sent to?   [facility] [address], [place] - [address]  [address] [place] [address]  Phone:  [phone] Fax:  [phone]  DEA #:  [id number]  3. Do they need a 30 day or 90 day supply? 90  day supply

## 2020-08-23 ENCOUNTER — Other Ambulatory Visit: Payer: Self-pay | Admitting: *Deleted

## 2020-08-23 DIAGNOSIS — I4819 Other persistent atrial fibrillation: Secondary | ICD-10-CM | POA: Diagnosis not present

## 2020-08-23 LAB — CBC
Hematocrit: 33.9 % — ABNORMAL LOW (ref 34.0–46.6)
Hemoglobin: 10.8 g/dL — ABNORMAL LOW (ref 11.1–15.9)
MCH: 28.6 pg (ref 26.6–33.0)
MCHC: 31.9 g/dL (ref 31.5–35.7)
MCV: 90 fL (ref 79–97)
Platelets: 243 10*3/uL (ref 150–450)
RBC: 3.77 x10E6/uL (ref 3.77–5.28)
RDW: 13.4 % (ref 11.7–15.4)
WBC: 6.9 10*3/uL (ref 3.4–10.8)

## 2020-08-23 LAB — BASIC METABOLIC PANEL
BUN/Creatinine Ratio: 16 (ref 12–28)
BUN: 14 mg/dL (ref 8–27)
CO2: 24 mmol/L (ref 20–29)
Calcium: 9.4 mg/dL (ref 8.7–10.3)
Chloride: 98 mmol/L (ref 96–106)
Creatinine, Ser: 0.88 mg/dL (ref 0.57–1.00)
GFR calc Af Amer: 73 mL/min/{1.73_m2} (ref >59)
GFR calc non Af Amer: 64 mL/min/{1.73_m2} (ref >59)
Glucose: 214 mg/dL — ABNORMAL HIGH (ref 65–99)
Potassium: 4.4 mmol/L (ref 3.5–5.2)
Sodium: 140 mmol/L (ref 134–144)

## 2020-09-07 ENCOUNTER — Ambulatory Visit (HOSPITAL_COMMUNITY)
Admission: RE | Admit: 2020-09-07 | Discharge: 2020-09-07 | Disposition: A | Discharge status: Home / Self Care | Payer: Medicare Other | Source: Ambulatory Visit | Attending: Physician Assistant | Admitting: Physician Assistant

## 2020-09-07 ENCOUNTER — Other Ambulatory Visit: Payer: Self-pay

## 2020-09-07 ENCOUNTER — Encounter (HOSPITAL_COMMUNITY): Payer: Self-pay | Admitting: Physician Assistant

## 2020-09-07 VITALS — BP 140/64 | HR 67 | Ht 59.0 in | Wt 152.8 lb

## 2020-09-07 DIAGNOSIS — Z6830 Body mass index (BMI) 30.0-30.9, adult: Secondary | ICD-10-CM | POA: Insufficient documentation

## 2020-09-07 DIAGNOSIS — D6869 Other thrombophilia: Secondary | ICD-10-CM | POA: Insufficient documentation

## 2020-09-07 DIAGNOSIS — Z7901 Long term (current) use of anticoagulants: Secondary | ICD-10-CM | POA: Insufficient documentation

## 2020-09-07 DIAGNOSIS — I4892 Unspecified atrial flutter: Secondary | ICD-10-CM | POA: Insufficient documentation

## 2020-09-07 DIAGNOSIS — E669 Obesity, unspecified: Secondary | ICD-10-CM | POA: Diagnosis not present

## 2020-09-07 DIAGNOSIS — E785 Hyperlipidemia, unspecified: Secondary | ICD-10-CM | POA: Insufficient documentation

## 2020-09-07 DIAGNOSIS — Z8249 Family history of ischemic heart disease and other diseases of the circulatory system: Secondary | ICD-10-CM | POA: Diagnosis not present

## 2020-09-07 DIAGNOSIS — I5032 Chronic diastolic (congestive) heart failure: Secondary | ICD-10-CM | POA: Diagnosis not present

## 2020-09-07 DIAGNOSIS — I110 Hypertensive heart disease with heart failure: Secondary | ICD-10-CM | POA: Insufficient documentation

## 2020-09-07 DIAGNOSIS — I4819 Other persistent atrial fibrillation: Secondary | ICD-10-CM | POA: Insufficient documentation

## 2020-09-07 DIAGNOSIS — Z87891 Personal history of nicotine dependence: Secondary | ICD-10-CM | POA: Insufficient documentation

## 2020-09-07 NOTE — Progress Notes (Signed)
Patient is being seen in the Atrial Fibrillation Clinic. VS, EKG, and device interrogations performed in the clinic. Clint Fenton PA is at home and seeing patients via telemedicine as he is in quarantine.  °

## 2020-09-07 NOTE — Progress Notes (Signed)
Primary Care Physician: Pearson Grippe, MD Primary Cardiologist: Dr Jacques Navy Primary Electrophysiologist: Dr Johney Frame Referring Physician: Dr Albertina Parr Virginia Gilbert is a 80 y.o. female with a history of DM, HLD, HTN, and persistent atrial fibrillation who presents for follow up in the [facility]. The patient was initially diagnosed with atrial fibrillation 05/2019 after presenting with symptoms of worsening dyspnea on exertion. Patient is on Eliquis for a CHADS2VASC score of 5. There were no specific triggers that the patient could identify. She underwent DCCV on 07/08/19 but had ERAF. She was loaded on amiodarone and had DCCV again on 09/02/19 but unfortunately she was back in afib again. She denies snoring or alcohol use. Patient is s/p afib ablation with Dr Johney Frame on 12/02/19. She was back in afib on her follow up 01/05/20  On follow up today, patient is s/p DCCV on 01/13/20. She was hospitalized the next day 6/2-01/16/20 with acute on chronic heart failure. Since that time she has done well with no significant recurrence of her afib. Her amiodarone was discontinued. She denies any bleeding issues on anticoagulation.   Today, she denies symptoms of palpitations, chest pain, shortness of breath, orthopnea, PND, dizziness, presyncope, syncope, snoring, daytime somnolence, bleeding, or neurologic sequela. The patient is tolerating medications without difficulties and is otherwise without complaint today.    Atrial Fibrillation Risk Factors:  she does not have symptoms or diagnosis of sleep apnea. she does not have a history of rheumatic fever. she does not have a history of alcohol use. The patient does not have a history of early familial atrial fibrillation or other arrhythmias.  she has a BMI of Body mass index is 30.86 kg/m.Marland Kitchen Filed Weights   09/07/20 0954  Weight: 69.3 kg    Family History  Problem Relation Age of Onset  . Heart disease Mother   . Diabetes  Mother   . Emphysema Father   . Emphysema Brother   . Heart disease Brother   . Diabetes Brother   . Heart disease Brother   . Breast cancer Daughter   . Breast cancer Daughter      Atrial Fibrillation Management history:  Previous antiarrhythmic drugs: amiodarone Previous cardioversions: 07/08/19, 09/02/19 Previous ablations: 12/02/19 CHADS2VASC score: 5 Anticoagulation history: Eliquis   Past Medical History:  Diagnosis Date  . Diabetes mellitus without complication (HCC)   . Hyperlipidemia   . Hypertension   . Mitral regurgitation    evaluated by TEE 09/2019  . Persistent atrial fibrillation [facility])    Past Surgical History:  Procedure Laterality Date  . ATRIAL FIBRILLATION ABLATION N/A 12/02/2019   Procedure: ATRIAL FIBRILLATION ABLATION;  Surgeon: Hillis Range, MD;  Location: [facility] INVASIVE CV LAB;  Service: Cardiovascular;  Laterality: N/A;  . BUBBLE STUDY  10/08/2019   Procedure: BUBBLE STUDY;  Surgeon: [name], MD;  Location: [facility] ENDOSCOPY;  Service: Cardiology;;  . CARDIOVERSION N/A 07/08/2019   Procedure: CARDIOVERSION;  Surgeon: Yates Decamp, MD;  Location: [facility] ENDOSCOPY;  Service: Cardiovascular;  Laterality: N/A;  . CARDIOVERSION N/A 09/02/2019   Procedure: CARDIOVERSION;  Surgeon: Yates Decamp, MD;  Location: [facility] ENDOSCOPY;  Service: Cardiovascular;  Laterality: N/A;  . CARDIOVERSION N/A 01/13/2020   Procedure: CARDIOVERSION;  Surgeon: Sande Rives, MD;  Location: [facility] ENDOSCOPY;  Service: Cardiovascular;  Laterality: N/A;  . CATARACT EXTRACTION, BILATERAL    . PARTIAL HYSTERECTOMY     1977  . TEE WITHOUT CARDIOVERSION N/A 10/08/2019   Procedure: TRANSESOPHAGEAL ECHOCARDIOGRAM (TEE);  Surgeon: [name], MD;  Location: [facility] ENDOSCOPY;  Service: Cardiology;  Laterality: N/A;    Current Outpatient Medications  Medication Sig Dispense Refill  . acetaminophen (TYLENOL) 500 MG tablet Take 500 mg by mouth every 8 (eight) hours as needed for moderate pain  or headache.     Marland Kitchen apixaban (ELIQUIS) 5 MG TABS tablet Take 1 tablet (5 mg total) by mouth 2 (two) times daily. 90 tablet 1  . carvedilol (COREG) 6.25 MG tablet Take 1 tablet (6.25 mg total) by mouth 2 (two) times daily. 180 tablet 3  . Cholecalciferol (VITAMIN D-3) 125 MCG (5000 UT) TABS Take 5,000 Units by mouth daily with lunch.     . Cyanocobalamin (B-12) 2500 MCG TABS Take 2,500 mcg by mouth daily with lunch.     . ezetimibe (ZETIA) 10 MG tablet Take 10 mg by mouth daily.    . furosemide (LASIX) 20 MG tablet Take 1-2 tablets (20-40 mg total) by mouth 2 (two) times daily. Take 2 tablets (40 mg total) in the mornings and 1 tablet (20 mg total) in the evenings. 360 tablet 3  . glimepiride (AMARYL) 4 MG tablet Take 1 tablet (4 mg total) by mouth daily with breakfast. (Patient taking differently: Take 4 mg by mouth 2 (two) times daily. 1 Tablet Twice Daily . 1 Tablet in the Morning ,1 Tablet in the Evening)  1  . KLOR-CON M20 20 MEQ tablet Take 20 mEq by mouth daily.    Marland Kitchen LORazepam (ATIVAN) 0.5 MG tablet Take 0.5 mg by mouth 4 (four) times daily as needed.    Marland Kitchen losartan (COZAAR) 100 MG tablet Take 100 mg by mouth daily.   6  . metFORMIN (GLUCOPHAGE) 1000 MG tablet Take 1,000 mg by mouth 2 (two) times daily with a meal.   6  . TRULICITY 1.5 MG/0.5ML SOPN Inject 1.5 mg into the skin every Friday.      No current facility-administered medications for this encounter.    Allergies  Allergen Reactions  . Penicillins Anaphylaxis    Did it involve swelling of the face/tongue/throat, SOB, or low BP? Yes Did it involve sudden or severe rash/hives, skin peeling, or any reaction on the inside of your mouth or nose? Unknown Did you need to seek medical attention at a hospital or doctor's office? Yes--inpatient when reaction occurred When did it last happen?1964 or 1965 If all above answers are "NO", may proceed with cephalosporin use.   . Tramadol Itching  . Codeine Nausea And Vomiting     Social History   Socioeconomic History  . Marital status: Single    Spouse name: Not on file  . Number of children: 3  . Years of education: Not on file  . Highest education level: Not on file  Occupational History  . Not on file  Tobacco Use  . Smoking status: Former Smoker    Packs/day: 0.50    Years: 15.00    Pack years: 7.50    Types: Cigarettes    Quit date: 05/14/1985    Years since quitting: 35.3  . Smokeless tobacco: Never Used  Vaping Use  . Vaping Use: Never used  Substance and Sexual Activity  . Alcohol use: No    Alcohol/week: 0.0 standard drinks  . Drug use: Never  . Sexual activity: Not on file  Other Topics Concern  . Not on file  Social History Narrative  . Not on file   Social Determinants of Health   Financial Resource Strain:  Not on file  Food Insecurity: Not on file  Transportation Needs: Not on file  Physical Activity: Not on file  Stress: Not on file  Social Connections: Not on file  Intimate Partner Violence: Not on file     ROS- All systems are reviewed and negative except as per the HPI above.  Physical Exam: Vitals:   09/07/20 0954  BP: 140/64  Pulse: 67  Weight: 69.3 kg  Height: 4\' 11"  (1.499 m)    Exam: Well appearing, alert and conversant, regular work of breathing,  good skin color Eyes- anicteric, neuro- grossly intact, skin- no apparent rash or lesions or cyanosis, mouth- oral mucosa is pink   Wt Readings from Last 3 Encounters:  09/07/20 69.3 kg  05/31/20 68.3 kg  05/27/20 67.3 kg    EKG today demonstrates  SR Vent. rate 67 BPM PR interval 146 ms QRS duration 68 ms QT/QTc 402/424 ms  Echo 12/05/19 demonstrated  1. Patient appears to be in atrial flutter throughout exam.  2. Left ventricular ejection fraction, by estimation, is 60 to 65%. The  left ventricle has normal function. The left ventricle has no regional  wall motion abnormalities. Left ventricular diastolic parameters are  indeterminate.  3.  Right ventricular systolic function is normal. The right ventricular  size is normal. There is mildly elevated pulmonary artery systolic  pressure.  4. Left atrial size was moderately dilated.  5. The mitral valve is normal in structure. Mild mitral valve  regurgitation. No evidence of mitral stenosis.  6. The aortic valve is tricuspid. Aortic valve regurgitation is trivial.  Mild to moderate aortic valve sclerosis/calcification is present, without  any evidence of aortic stenosis.  7. The inferior vena cava is normal in size with greater than 50%  respiratory variability, suggesting right atrial pressure of 3 mmHg.   Epic records are reviewed at length today  CHA2DS2-VASc Score = 5 The patient's score is based upon: CHF History: No HTN History: Yes Age : 27 + Diabetes History: Yes Stroke History: No Vascular Disease History: No Gender: Female   ASSESSMENT AND PLAN: 1. Persistent Atrial Fibrillation/atrial flutter The patient's CHA2DS2-VASc score is 5, indicating a 7.2% annual risk of stroke.   S/p ablation 12/02/19 Patient appears to be maintaining SR off AAD. Continue Eliquis 5 mg BID Continue coreg 6.25 mg BID  2. Secondary Hypercoagulable State (ICD10:  D68.69) The patient is at significant risk for stroke/thromboembolism based upon her CHA2DS2-VASc Score of 5.  Continue Apixaban (Eliquis).   3. Obesity Body mass index is 30.86 kg/m. Lifestyle modification was discussed and encouraged including regular physical activity and weight reduction.  4. HTN Stable, no changes today.  5. Chronic diastolic CHF No signs or symptoms of fluid overload today.   Follow up with Dr 12/04/19 and Dr Jacques Navy per recalls.    Johney Frame PA-C Af[facility] Clinic [facility] [address] [place], [place] [phone] 09/07/2020 4:17 PM

## 2020-09-17 DIAGNOSIS — I10 Essential (primary) hypertension: Secondary | ICD-10-CM | POA: Diagnosis not present

## 2020-09-17 DIAGNOSIS — E7800 Pure hypercholesterolemia, unspecified: Secondary | ICD-10-CM | POA: Diagnosis not present

## 2020-09-17 DIAGNOSIS — E118 Type 2 diabetes mellitus with unspecified complications: Secondary | ICD-10-CM | POA: Diagnosis not present

## 2020-09-17 DIAGNOSIS — R946 Abnormal results of thyroid function studies: Secondary | ICD-10-CM | POA: Diagnosis not present

## 2020-09-21 DIAGNOSIS — D649 Anemia, unspecified: Secondary | ICD-10-CM | POA: Diagnosis not present

## 2020-09-21 DIAGNOSIS — E119 Type 2 diabetes mellitus without complications: Secondary | ICD-10-CM | POA: Diagnosis not present

## 2020-09-21 DIAGNOSIS — Z0000 Encounter for general adult medical examination without abnormal findings: Secondary | ICD-10-CM | POA: Diagnosis not present

## 2020-10-26 ENCOUNTER — Other Ambulatory Visit: Payer: Self-pay | Admitting: Family Medicine

## 2020-10-26 ENCOUNTER — Ambulatory Visit
Admission: RE | Admit: 2020-10-26 | Discharge: 2020-10-26 | Disposition: A | Discharge status: Home / Self Care | Payer: Medicare Other | Source: Ambulatory Visit | Attending: Family Medicine | Admitting: Family Medicine

## 2020-10-26 DIAGNOSIS — D649 Anemia, unspecified: Secondary | ICD-10-CM

## 2020-10-26 DIAGNOSIS — R109 Unspecified abdominal pain: Secondary | ICD-10-CM

## 2020-10-26 DIAGNOSIS — R110 Nausea: Secondary | ICD-10-CM | POA: Diagnosis not present

## 2020-10-26 DIAGNOSIS — R112 Nausea with vomiting, unspecified: Secondary | ICD-10-CM

## 2020-10-26 DIAGNOSIS — R634 Abnormal weight loss: Secondary | ICD-10-CM | POA: Diagnosis not present

## 2020-10-26 DIAGNOSIS — I10 Essential (primary) hypertension: Secondary | ICD-10-CM | POA: Diagnosis not present

## 2020-10-26 DIAGNOSIS — N200 Calculus of kidney: Secondary | ICD-10-CM | POA: Diagnosis not present

## 2020-10-26 MED ORDER — IOPAMIDOL (ISOVUE-300) INJECTION 61%
100.0000 mL | Freq: Once | INTRAVENOUS | Status: AC | PRN
  Administered 2020-10-26: 100 mL via INTRAVENOUS

## 2020-10-27 ENCOUNTER — Other Ambulatory Visit: Payer: Self-pay | Admitting: Physician Assistant

## 2020-10-27 ENCOUNTER — Telehealth: Payer: Self-pay | Admitting: *Deleted

## 2020-10-27 DIAGNOSIS — R1011 Right upper quadrant pain: Secondary | ICD-10-CM | POA: Diagnosis not present

## 2020-10-27 DIAGNOSIS — R1013 Epigastric pain: Secondary | ICD-10-CM

## 2020-10-27 DIAGNOSIS — D649 Anemia, unspecified: Secondary | ICD-10-CM | POA: Diagnosis not present

## 2020-10-27 DIAGNOSIS — R112 Nausea with vomiting, unspecified: Secondary | ICD-10-CM

## 2020-10-27 DIAGNOSIS — I4891 Unspecified atrial fibrillation: Secondary | ICD-10-CM | POA: Diagnosis not present

## 2020-10-27 NOTE — Telephone Encounter (Signed)
   [facility] Pre-operative Risk Assessment    HEARTCARE STAFF: - Please ensure there is not already an duplicate clearance open for this procedure. - Under Visit Info/Reason for Call, type in Other and utilize the format Clearance MM/DD/YY or Clearance TBD. Do not use dashes or single digits. - If request is for dental extraction, please clarify the # of teeth to be extracted.  Request for surgical clearance:  1. What type of surgery is being performed? ENDOSCOPY   2. When is this surgery scheduled? 11/03/20   3. What type of clearance is required (medical clearance vs. Pharmacy clearance to hold med vs. Both)? BOTH  4. Are there any medications that need to be held prior to surgery and how long? ELIQUIS   5. Practice name and name of physician performing surgery? [facility]; DR. Therisa Doyne   6. What is the office phone number? [phone]   7.   What is the office fax number? [phone]  8.   Anesthesia type (None, local, MAC, general) ? PROPOFOL   Julaine Hua 10/27/2020, 4:47 PM  _________________________________________________________________   (provider comments below)

## 2020-10-28 DIAGNOSIS — R1013 Epigastric pain: Secondary | ICD-10-CM | POA: Diagnosis not present

## 2020-10-28 NOTE — Telephone Encounter (Signed)
   Primary Cardiologist: [name], MD  Chart reviewed as part of pre-operative protocol coverage. Patient was contacted 10/28/2020 in reference to pre-operative risk assessment for pending surgery as outlined below.  Virginia Gilbert was last seen on 09/07/20 by Jorja Loa, PA-C in the atrial fibrillation clinic.  Since that day, Virginia Gilbert has done fine from a cardiac standpoint. She can complete 4 METs without anginal complaints.  Therefore, based on ACC/AHA guidelines, the patient would be at acceptable risk for the planned procedure without further cardiovascular testing.   The patient was advised that if she develops new symptoms prior to surgery to contact our office to arrange for a follow-up visit, and she verbalized understanding.  Per pharmacy recommendations, patient can hold eliquis 1-2 days prior to her endoscopy with plans to restart when cleared to do so by her gastroenterologist.   I will route this recommendation to the requesting party via Epic fax function and remove from pre-op pool. Please call with questions.  Beatriz Stallion, PA-C 10/28/2020, 3:13 PM

## 2020-10-28 NOTE — Telephone Encounter (Signed)
   Primary Cardiologist: [name], MD  Chart reviewed as part of pre-operative protocol coverage.   Left a voicemail for patient to call back for ongoing preop assessment.  Beatriz Stallion, PA-C 10/28/2020, 11:08 AM

## 2020-10-28 NOTE — Telephone Encounter (Signed)
Pt is returning call.  

## 2020-10-28 NOTE — Telephone Encounter (Signed)
Patient with diagnosis of afib on Eliquis for anticoagulation.    Procedure: Endoscopy Date of procedure: 11/03/20  CHA2DS2-VASc Score = 6  This indicates a 9.7% annual risk of stroke. The patient's score is based upon: CHF History: Yes HTN History: Yes Diabetes History: Yes Stroke History: No Vascular Disease History: No Age Score: 2 Gender Score: 1     CrCl 45 ml/min  Per office protocol, patient can hold Eliquis for 1-2 days prior to procedure.

## 2020-10-29 DIAGNOSIS — Z01812 Encounter for preprocedural laboratory examination: Secondary | ICD-10-CM | POA: Diagnosis not present

## 2020-11-03 DIAGNOSIS — R112 Nausea with vomiting, unspecified: Secondary | ICD-10-CM | POA: Diagnosis not present

## 2020-11-03 DIAGNOSIS — R195 Other fecal abnormalities: Secondary | ICD-10-CM | POA: Diagnosis not present

## 2020-11-03 DIAGNOSIS — R1013 Epigastric pain: Secondary | ICD-10-CM | POA: Diagnosis not present

## 2020-11-03 DIAGNOSIS — K31A11 Gastric intestinal metaplasia without dysplasia, involving the antrum: Secondary | ICD-10-CM | POA: Diagnosis not present

## 2020-11-03 DIAGNOSIS — K2940 Chronic atrophic gastritis without bleeding: Secondary | ICD-10-CM | POA: Diagnosis not present

## 2020-11-03 DIAGNOSIS — K259 Gastric ulcer, unspecified as acute or chronic, without hemorrhage or perforation: Secondary | ICD-10-CM | POA: Diagnosis not present

## 2020-11-03 DIAGNOSIS — D509 Iron deficiency anemia, unspecified: Secondary | ICD-10-CM | POA: Diagnosis not present

## 2020-11-03 DIAGNOSIS — K31A19 Gastric intestinal metaplasia without dysplasia, unspecified site: Secondary | ICD-10-CM | POA: Diagnosis not present

## 2020-11-03 DIAGNOSIS — K2930 Chronic superficial gastritis without bleeding: Secondary | ICD-10-CM | POA: Diagnosis not present

## 2020-11-03 DIAGNOSIS — K3189 Other diseases of stomach and duodenum: Secondary | ICD-10-CM | POA: Diagnosis not present

## 2020-11-06 ENCOUNTER — Other Ambulatory Visit: Payer: Self-pay | Admitting: Internal Medicine

## 2020-11-08 ENCOUNTER — Other Ambulatory Visit: Payer: Self-pay | Admitting: Physician Assistant

## 2020-11-08 DIAGNOSIS — D649 Anemia, unspecified: Secondary | ICD-10-CM

## 2020-11-08 DIAGNOSIS — R1011 Right upper quadrant pain: Secondary | ICD-10-CM

## 2020-11-08 DIAGNOSIS — R1013 Epigastric pain: Secondary | ICD-10-CM

## 2020-11-08 DIAGNOSIS — R112 Nausea with vomiting, unspecified: Secondary | ICD-10-CM

## 2020-11-09 DIAGNOSIS — K31A19 Gastric intestinal metaplasia without dysplasia, unspecified site: Secondary | ICD-10-CM | POA: Diagnosis not present

## 2020-11-09 DIAGNOSIS — R1011 Right upper quadrant pain: Secondary | ICD-10-CM | POA: Diagnosis not present

## 2020-11-09 DIAGNOSIS — K2100 Gastro-esophageal reflux disease with esophagitis, without bleeding: Secondary | ICD-10-CM | POA: Diagnosis not present

## 2020-11-09 DIAGNOSIS — E7800 Pure hypercholesterolemia, unspecified: Secondary | ICD-10-CM | POA: Diagnosis not present

## 2020-11-09 DIAGNOSIS — K2930 Chronic superficial gastritis without bleeding: Secondary | ICD-10-CM | POA: Diagnosis not present

## 2020-11-09 DIAGNOSIS — K7689 Other specified diseases of liver: Secondary | ICD-10-CM | POA: Diagnosis not present

## 2020-11-09 DIAGNOSIS — K2940 Chronic atrophic gastritis without bleeding: Secondary | ICD-10-CM | POA: Diagnosis not present

## 2020-11-11 ENCOUNTER — Ambulatory Visit
Admission: RE | Admit: 2020-11-11 | Discharge: 2020-11-11 | Disposition: A | Discharge status: Home / Self Care | Payer: Medicare Other | Source: Ambulatory Visit | Attending: Physician Assistant | Admitting: Physician Assistant

## 2020-11-11 ENCOUNTER — Other Ambulatory Visit: Payer: Self-pay

## 2020-11-11 DIAGNOSIS — R1013 Epigastric pain: Secondary | ICD-10-CM

## 2020-11-11 DIAGNOSIS — D649 Anemia, unspecified: Secondary | ICD-10-CM

## 2020-11-11 DIAGNOSIS — K760 Fatty (change of) liver, not elsewhere classified: Secondary | ICD-10-CM | POA: Diagnosis not present

## 2020-11-11 DIAGNOSIS — R1011 Right upper quadrant pain: Secondary | ICD-10-CM

## 2020-11-11 DIAGNOSIS — R112 Nausea with vomiting, unspecified: Secondary | ICD-10-CM

## 2020-11-11 MED ORDER — APIXABAN 5 MG PO TABS: 5.0000 mg | ORAL_TABLET | Freq: Two times a day (BID) | ORAL | 1 refills | Status: DC

## 2020-11-11 NOTE — Telephone Encounter (Signed)
89f, 68.3kg, scr 0.88 08/23/20, lovw/fenton 09/07/20

## 2020-11-16 DIAGNOSIS — H59811 Chorioretinal scars after surgery for detachment, right eye: Secondary | ICD-10-CM | POA: Diagnosis not present

## 2020-11-16 DIAGNOSIS — H524 Presbyopia: Secondary | ICD-10-CM | POA: Diagnosis not present

## 2020-11-16 DIAGNOSIS — E119 Type 2 diabetes mellitus without complications: Secondary | ICD-10-CM | POA: Diagnosis not present

## 2020-11-16 DIAGNOSIS — H1789 Other corneal scars and opacities: Secondary | ICD-10-CM | POA: Diagnosis not present

## 2020-11-16 DIAGNOSIS — H35363 Drusen (degenerative) of macula, bilateral: Secondary | ICD-10-CM | POA: Diagnosis not present

## 2020-12-06 ENCOUNTER — Telehealth: Payer: Self-pay | Admitting: *Deleted

## 2020-12-06 DIAGNOSIS — R195 Other fecal abnormalities: Secondary | ICD-10-CM | POA: Diagnosis not present

## 2020-12-06 DIAGNOSIS — R1013 Epigastric pain: Secondary | ICD-10-CM | POA: Diagnosis not present

## 2020-12-06 DIAGNOSIS — K259 Gastric ulcer, unspecified as acute or chronic, without hemorrhage or perforation: Secondary | ICD-10-CM | POA: Diagnosis not present

## 2020-12-06 DIAGNOSIS — D509 Iron deficiency anemia, unspecified: Secondary | ICD-10-CM | POA: Diagnosis not present

## 2020-12-06 DIAGNOSIS — I4891 Unspecified atrial fibrillation: Secondary | ICD-10-CM | POA: Diagnosis not present

## 2020-12-06 NOTE — Telephone Encounter (Signed)
This encounter was created in error - please disregard.

## 2020-12-06 NOTE — Telephone Encounter (Signed)
   [facility] Pre-operative Risk Assessment    Patient Name: TALAYIA HJORT  DOB: [birth year]  MRN: [id number]   HEARTCARE STAFF: - Please ensure there is not already an duplicate clearance open for this procedure. - Under Visit Info/Reason for Call, type in Other and utilize the format Clearance MM/DD/YY or Clearance TBD. Do not use dashes or single digits. - If request is for dental extraction, please clarify the # of teeth to be extracted.  Request for surgical clearance: THIS IS DIFFERENT CLEARANCE REQUEST FROM RECENT ONE 10/2020  1. What type of surgery is being performed? COLONOSCOPY/ENDOSCOPY   2. When is this surgery scheduled? 03/14/21   3. What type of clearance is required (medical clearance vs. Pharmacy clearance to hold med vs. Both)? BOTH  4. Are there any medications that need to be held prior to surgery and how long? ELIQUIS x 48 HOURS PRIOR RO PROCEDURE  5. Practice name and name of physician performing surgery? [facility]; DR. Therisa Doyne   6. What is the office phone number? [phone]   7.   What is the office fax number? [phone]  8.   Anesthesia type (None, local, MAC, general) ? PROPOFOL   Julaine Hua 12/06/2020, 6:05 PM  _________________________________________________________________   (provider comments below)

## 2020-12-06 NOTE — Addendum Note (Signed)
Addended by: Tarri Fuller on: 12/06/2020 06:05 PM   Modules accepted: Level of Service, SmartSet

## 2020-12-06 NOTE — Telephone Encounter (Signed)
ADDENDUM: DUPLICATE CLEARANCE REQUEST WAS RECEIVED IN  OUR OFFICE TODAY; CLEARANCE SENT IN TODAY DOES STATE PROCEDURE IS ENDOSCOPY THOUGH ALSO NOW STATES COLONOSCOPY AS WELL.   WOULD LIKE TO HOLD ELIQUIS x 48 HOURS PRIOR.   I FELT NO NEED TO DUPLICATE CLEARANCE REQUEST. CREATED AN ADDENDUM. I WILL FORWARD TO THE PRE OP TEAM FOR FURTHER REVIEW AND IF ANY NEW RECOMMENDATIONS.

## 2020-12-07 ENCOUNTER — Encounter: Payer: Self-pay | Admitting: Internal Medicine

## 2020-12-07 ENCOUNTER — Other Ambulatory Visit: Payer: Self-pay

## 2020-12-07 ENCOUNTER — Ambulatory Visit: Payer: Medicare Other | Admitting: Internal Medicine

## 2020-12-07 VITALS — BP 176/82 | HR 72 | Ht 59.0 in | Wt 147.0 lb

## 2020-12-07 DIAGNOSIS — I4819 Other persistent atrial fibrillation: Secondary | ICD-10-CM | POA: Diagnosis not present

## 2020-12-07 DIAGNOSIS — Z79899 Other long term (current) drug therapy: Secondary | ICD-10-CM

## 2020-12-07 DIAGNOSIS — I10 Essential (primary) hypertension: Secondary | ICD-10-CM | POA: Diagnosis not present

## 2020-12-07 DIAGNOSIS — R112 Nausea with vomiting, unspecified: Secondary | ICD-10-CM | POA: Diagnosis not present

## 2020-12-07 DIAGNOSIS — D6869 Other thrombophilia: Secondary | ICD-10-CM

## 2020-12-07 DIAGNOSIS — I340 Nonrheumatic mitral (valve) insufficiency: Secondary | ICD-10-CM

## 2020-12-07 NOTE — Patient Instructions (Signed)
Medication Instructions:  Your physician recommends that you continue on your current medications as directed. Please refer to the Current Medication list given to you today.  *If you need a refill on your cardiac medications before your next appointment, please call your pharmacy*   Follow-Up: At [facility], you and your health needs are our priority.  As part of our continuing mission to provide you with exceptional heart care, we have created designated Provider Care Teams.  These Care Teams include your primary Cardiologist (physician) and Advanced Practice Providers (APPs -  Physician Assistants and Nurse Practitioners) who all work together to provide you with the care you need, when you need it.  We recommend signing up for the patient portal called "MyChart".  Sign up information is provided on this After Visit Summary.  MyChart is used to connect with patients for Virtual Visits (Telemedicine).  Patients are able to view lab/test results, encounter notes, upcoming appointments, etc.  Non-urgent messages can be sent to your provider as well.   To learn more about what you can do with MyChart, go to [email or web address].    Your next appointment:   6 month(s)  The format for your next appointment:   In Person  Provider:   Gayatri Acharya, MD  

## 2020-12-07 NOTE — Progress Notes (Signed)
Cardiology Office Note:    Date:  12/07/2020   ID:  LAURELIN ELSON, DOB 02/08/1941, MRN [id number]  PCP:  Pearson Grippe, MD  Cardiologist:  [name], MD  Electrophysiologist:  Hillis Range, MD   Referring MD: Pearson Grippe, MD   Chief Complaint/Reason for Referral: Afib, HFpEF  History of Present Illness:    Virginia Gilbert is a 80 y.o. female with a history of afib s/p ablation on Eliquis, and HFpEF, as well as DM2.   Today, she is feeling awful. For the past 6 months, she will occasionally wake up, become nauseated and vomit, and she will continue to feel sick and fatigued for up to 4 days afterwards. Since her previous visit, she believes she has not vomited as frequently, and felt amiodarone was a culprit.  Prior to vomiting she will feel like she is choking. Usually, her vomit consists of undigested food from the day before. She does experience bloating as well that she describes as making her "look like she is pregnant." She denies a sense of SOB since ablation.   Her at home blood pressure readings are low to normal. She states her blood pressure will usually spike while waiting for her doctor visit to begin.  The patient denies chest pain, chest pressure, dyspnea at rest or with exertion, palpitations, PND, orthopnea, or leg swelling. Denies cough, fever, chills. Denies syncope or presyncope. Denies dizziness or lightheadedness.  We discussed her medication regimen in detail, including the possibility she is experiencing a medication side effect contributing to nausea. I also have a suspicion for gastroparesis given undigested food and report of bloating/slow transit and history of diabetes.   Recently, she has been feeling sad as Mother's Day approaches and she thinks of her daughters and sisters. Her sole living daughter has cancer in remission.   Past Medical History:  Diagnosis Date  . Diabetes mellitus without complication (HCC)   . Hyperlipidemia   . Hypertension    . Mitral regurgitation    evaluated by TEE 09/2019  . Persistent atrial fibrillation [facility])     Past Surgical History:  Procedure Laterality Date  . ATRIAL FIBRILLATION ABLATION N/A 12/02/2019   Procedure: ATRIAL FIBRILLATION ABLATION;  Surgeon: Hillis Range, MD;  [facility]: [facility] INVASIVE CV LAB;  Service: Cardiovascular;  Laterality: N/A;  . BUBBLE STUDY  10/08/2019   Procedure: BUBBLE STUDY;  Surgeon: [name], MD;  Location: [facility] ENDOSCOPY;  Service: Cardiology;;  . CARDIOVERSION N/A 07/08/2019   Procedure: CARDIOVERSION;  Surgeon: Yates Decamp, MD;  Location: [facility] ENDOSCOPY;  Service: Cardiovascular;  Laterality: N/A;  . CARDIOVERSION N/A 09/02/2019   Procedure: CARDIOVERSION;  Surgeon: Yates Decamp, MD;  Location: [facility] ENDOSCOPY;  Service: Cardiovascular;  Laterality: N/A;  . CARDIOVERSION N/A 01/13/2020   Procedure: CARDIOVERSION;  Surgeon: Sande Rives, MD;  Location: [facility] ENDOSCOPY;  Service: Cardiovascular;  Laterality: N/A;  . CATARACT EXTRACTION, BILATERAL    . PARTIAL HYSTERECTOMY     1977  . TEE WITHOUT CARDIOVERSION N/A 10/08/2019   Procedure: TRANSESOPHAGEAL ECHOCARDIOGRAM (TEE);  Surgeon: [name], MD;  Location: [facility] ENDOSCOPY;  Service: Cardiology;  Laterality: N/A;    Current Medications: Current Meds  Medication Sig  . acetaminophen (TYLENOL) 500 MG tablet Take 500 mg by mouth every 8 (eight) hours as needed for moderate pain or headache.   Marland Kitchen apixaban (ELIQUIS) 5 MG TABS tablet Take 1 tablet (5 mg total) by mouth 2 (two) times daily.  . carvedilol (COREG) 6.25 MG tablet  Take 1 tablet (6.25 mg total) by mouth 2 (two) times daily.  . Cholecalciferol (VITAMIN D-3) 125 MCG (5000 UT) TABS Take 5,000 Units by mouth daily with lunch.   . Cyanocobalamin (B-12) 2500 MCG TABS Take 2,500 mcg by mouth daily with lunch.   . ezetimibe (ZETIA) 10 MG tablet Take 10 mg by mouth daily.  . ferrous gluconate (FERGON) 240 (27 FE) MG tablet 1 tablet with water or juice between  meals  . furosemide (LASIX) 20 MG tablet Take 1-2 tablets (20-40 mg total) by mouth 2 (two) times daily. Take 2 tablets (40 mg total) in the mornings and 1 tablet (20 mg total) in the evenings.  Marland Kitchen glimepiride (AMARYL) 4 MG tablet Take 1 tablet (4 mg total) by mouth daily with breakfast. (Patient taking differently: Take 4 mg by mouth 2 (two) times daily. 1 Tablet Twice Daily . 1 Tablet in the Morning ,1 Tablet in the Evening)  . KLOR-CON M20 20 MEQ tablet Take 20 mEq by mouth daily.  Marland Kitchen LORazepam (ATIVAN) 0.5 MG tablet Take 0.5 mg by mouth 4 (four) times daily as needed.  Marland Kitchen losartan (COZAAR) 100 MG tablet Take 100 mg by mouth daily.   . metFORMIN (GLUCOPHAGE) 1000 MG tablet Take 1,000 mg by mouth 2 (two) times daily with a meal.   . ondansetron (ZOFRAN) 8 MG tablet Take 8 mg by mouth as needed.  . pantoprazole (PROTONIX) 40 MG tablet Take 1 tablet by mouth 2 (two) times daily.  . TRULICITY 1.5 MG/0.5ML SOPN Inject 1.5 mg into the skin every Friday.      Allergies:   Penicillins, Tramadol, and Codeine   Social History   Tobacco Use  . Smoking status: Former Smoker    Packs/day: 0.50    Years: 15.00    Pack years: 7.50    Types: Cigarettes    Quit date: 05/14/1985    Years since quitting: 35.5  . Smokeless tobacco: Never Used  Vaping Use  . Vaping Use: Never used  Substance Use Topics  . Alcohol use: No    Alcohol/week: 0.0 standard drinks  . Drug use: Never     Family History: The patient's family history includes Breast cancer in her daughter and daughter; Diabetes in her brother and mother; Emphysema in her brother and father; Heart disease in her brother, brother, and mother.  ROS:   Please see the history of present illness.    (+) Nausea/Vomiting (+) Fatigue (+) Bloating All other systems reviewed and are negative.  EKGs/Labs/Other Studies Reviewed:    The following studies were reviewed today:  EKG:   12/07/2020: Sinus rhythm. Rate 72 bpm.  Recent Labs: 01/14/2020:  B Natriuretic Peptide 98.4 01/16/2020: Magnesium 1.6 02/17/2020: ALT 30; TSH 2.590 08/23/2020: BUN 14; Creatinine, Ser 0.88; Hemoglobin 10.8; Platelets 243; Potassium 4.4; Sodium 140  Recent Lipid Panel No results found for: CHOL, TRIG, HDL, CHOLHDL, VLDL, LDLCALC, LDLDIRECT  Physical Exam:    VS:  BP (!) 176/82   Pulse 72   Ht 4\' 11"  (1.499 m)   Wt 147 lb (66.7 kg)   SpO2 95%   BMI 29.69 kg/m     Wt Readings from Last 5 Encounters:  09/07/20 152 lb 12.8 oz (69.3 kg)  05/31/20 150 lb 9.6 oz (68.3 kg)  05/27/20 148 lb 6.4 oz (67.3 kg)  04/09/20 142 lb (64.4 kg)  03/19/20 144 lb (65.3 kg)    Constitutional: No acute distress Eyes: sclera non-icteric, normal conjunctiva and lids ENMT: normal  dentition, moist mucous membranes Cardiovascular: regular rhythm, normal rate, no murmurs. S1 and S2 normal. Radial pulses normal bilaterally. No jugular venous distention.  Respiratory: clear to auscultation bilaterally GI : normal bowel sounds, soft and nontender. Mild distention, not tense MSK: extremities warm, well perfused. No edema.  NEURO: grossly nonfocal exam, moves all extremities. PSYCH: alert and oriented x 3, normal mood and affect.   ASSESSMENT:    1. Persistent atrial fibrillation (HCC)   2. Secondary hypercoagulable state (HCC)   3. Non-intractable vomiting with nausea, unspecified vomiting type   4. Essential hypertension   5. Medication management   6. Mild mitral regurgitation    PLAN:    Persistent atrial fibrillation (HCC) Secondary hypercoagulable state (HCC) - remains in SR. On eliquis and carvedilol, continue.  Non-intractable vomiting with nausea, unspecified vomiting type - concern for medication side effect vs complication of diabetes such as gastroparesis. Consider GI evaluation.  Essential hypertension - Plan: EKG 12-Lead -Tells me her BP runs low at home, and office readings are often falsely elevated. So as not to complicated her symptoms at this time,  will defer additional medication therapy.    Total time of encounter: 30 minutes total time of encounter, including 20 minutes spent in face-to-face patient care on the date of this encounter. This time includes coordination of care and counseling regarding above mentioned problem list. Remainder of non-face-to-face time involved reviewing chart documents/testing relevant to the patient encounter and documentation in the medical record. I have independently reviewed documentation from referring provider.   Weston Brass, MD, [facility]  [facility]    Medication Adjustments/Labs and Tests Ordered: Current medicines are reviewed at length with the patient today.  Concerns regarding medicines are outlined above.   Orders Placed This Encounter  Procedures  . EKG 12-Lead    No orders of the defined types were placed in this encounter.   Patient Instructions  Medication Instructions:  Your physician recommends that you continue on your current medications as directed. Please refer to the Current Medication list given to you today.  *If you need a refill on your cardiac medications before your next appointment, please call your pharmacy*   Follow-Up: At [facility], you and your health needs are our priority.  As part of our continuing mission to provide you with exceptional heart care, we have created designated Provider Care Teams.  These Care Teams include your primary Cardiologist (physician) and Advanced Practice Providers (APPs -  Physician Assistants and Nurse Practitioners) who all work together to provide you with the care you need, when you need it.  We recommend signing up for the patient portal called "MyChart".  Sign up information is provided on this After Visit Summary.  MyChart is used to connect with patients for Virtual Visits (Telemedicine).  Patients are able to view lab/test results, encounter notes, upcoming appointments, etc.  Non-urgent messages can be  sent to your provider as well.   To learn more about what you can do with MyChart, go to [email or web address].    Your next appointment:   6 month(s)  The format for your next appointment:   In Person  Provider:   Weston Brass, MD         [facility],acting as a scribe for [name], MD.,have documented all relevant documentation on the behalf of [name], MD,as directed by  [name], MD while in the presence of [name], MD.  I, [name], MD,  have reviewed all documentation for this visit. The documentation on 04/26//22 for the exam, diagnosis, procedures, and orders are all accurate and complete.

## 2020-12-08 NOTE — Telephone Encounter (Signed)
    CORTASIA SCREWS DOB:  Feb 09, 1941  MRN:  [id number]   Primary Cardiologist: [name], MD  Chart reviewed as part of pre-operative protocol coverage. Given past medical history and time since last visit, based on ACC/AHA guidelines, AMERIAH LINT would be at acceptable risk for the planned procedure without further cardiovascular testing.  Note, patient was already cleared for the same procedure last month.  Patient may hold Eliquis for 2 days prior to the procedure and restart as soon as possible afterward at the surgeon's discretion.  I will route this recommendation to the requesting party via Epic fax function and remove from pre-op pool.  Please call with questions.  [place], [place] 12/08/2020, 4:00 PM

## 2020-12-09 DIAGNOSIS — R112 Nausea with vomiting, unspecified: Secondary | ICD-10-CM | POA: Diagnosis not present

## 2020-12-09 DIAGNOSIS — I10 Essential (primary) hypertension: Secondary | ICD-10-CM | POA: Diagnosis not present

## 2020-12-09 DIAGNOSIS — K219 Gastro-esophageal reflux disease without esophagitis: Secondary | ICD-10-CM | POA: Diagnosis not present

## 2020-12-09 DIAGNOSIS — E118 Type 2 diabetes mellitus with unspecified complications: Secondary | ICD-10-CM | POA: Diagnosis not present

## 2020-12-09 DIAGNOSIS — R109 Unspecified abdominal pain: Secondary | ICD-10-CM | POA: Diagnosis not present

## 2020-12-22 DIAGNOSIS — E119 Type 2 diabetes mellitus without complications: Secondary | ICD-10-CM | POA: Diagnosis not present

## 2020-12-22 DIAGNOSIS — I10 Essential (primary) hypertension: Secondary | ICD-10-CM | POA: Diagnosis not present

## 2020-12-22 DIAGNOSIS — E7800 Pure hypercholesterolemia, unspecified: Secondary | ICD-10-CM | POA: Diagnosis not present

## 2020-12-22 DIAGNOSIS — D649 Anemia, unspecified: Secondary | ICD-10-CM | POA: Diagnosis not present

## 2020-12-30 DIAGNOSIS — K2100 Gastro-esophageal reflux disease with esophagitis, without bleeding: Secondary | ICD-10-CM | POA: Diagnosis not present

## 2020-12-30 DIAGNOSIS — E7800 Pure hypercholesterolemia, unspecified: Secondary | ICD-10-CM | POA: Diagnosis not present

## 2020-12-30 DIAGNOSIS — E118 Type 2 diabetes mellitus with unspecified complications: Secondary | ICD-10-CM | POA: Diagnosis not present

## 2020-12-30 DIAGNOSIS — I10 Essential (primary) hypertension: Secondary | ICD-10-CM | POA: Diagnosis not present

## 2021-01-20 ENCOUNTER — Telehealth: Payer: Self-pay | Admitting: Internal Medicine

## 2021-01-20 NOTE — Telephone Encounter (Signed)
Received stat call into triage from patient with complaints of shortness of breath, palpitations, and chest pressure. Patient states she woke up yesterday morning at 4am with chest pressure, shortness of breath, and felt like her heart was going to beat out of her chest. Patient states that she has not gained any weight, and denies any swelling. Patient states it was a sudden onset and has not improved with movement, change of position, etc. Patient states that she can not lay down because she feels like she can not breath. Patients BP currently is 178/77 with HR 78. Patient states that her palpitations are not as bad as yesterday but she can still feel them. Patient states the chest pressure is in the center of her chest, patient denies any radiating chest pain but states she has felt that her chest feels very heavy. Patient states that she does not feel like she has any fluid on board, and states that she does not have any indigestion type symptoms.   Advised patient that due to current symptoms she should report to the ER to be seen. Patient states she does not want to go to the ER but states that she will go to Urgent Care. Explained to patient that ED would be the most appropriate for her symptoms. Patient aware and verbalized understanding.   Spoke with Dr. Jacques Navy who agrees with ER presentation.

## 2021-01-20 NOTE — Telephone Encounter (Signed)
Pt c/o Shortness Of Breath: STAT if SOB developed within the last 24 hours or pt is noticeably SOB on the phone  1. Are you currently SOB (can you hear that pt is SOB on the phone)? Patient doesn't sound SOB on the phone, but she states she is currently SOB  2. How long have you been experiencing SOB? Developed last night  3. Are you SOB when sitting or when up moving around? Both   4. Are you currently experiencing any other symptoms?  States she feels "funny" in her chest Denies chest pain and palpitations

## 2021-02-08 ENCOUNTER — Other Ambulatory Visit: Payer: Self-pay | Admitting: Physician Assistant

## 2021-02-24 DIAGNOSIS — M65341 Trigger finger, right ring finger: Secondary | ICD-10-CM | POA: Diagnosis not present

## 2021-02-24 DIAGNOSIS — M65342 Trigger finger, left ring finger: Secondary | ICD-10-CM | POA: Diagnosis not present

## 2021-03-07 ENCOUNTER — Emergency Department (HOSPITAL_COMMUNITY)
Admission: EM | Admit: 2021-03-07 | Discharge: 2021-03-07 | Disposition: A | Discharge status: Home / Self Care | Payer: Medicare Other | Attending: Emergency Medicine | Admitting: Emergency Medicine

## 2021-03-07 DIAGNOSIS — Z5321 Procedure and treatment not carried out due to patient leaving prior to being seen by health care provider: Secondary | ICD-10-CM | POA: Insufficient documentation

## 2021-03-07 DIAGNOSIS — Z743 Need for continuous supervision: Secondary | ICD-10-CM | POA: Diagnosis not present

## 2021-03-07 DIAGNOSIS — M549 Dorsalgia, unspecified: Secondary | ICD-10-CM | POA: Diagnosis not present

## 2021-03-07 DIAGNOSIS — R069 Unspecified abnormalities of breathing: Secondary | ICD-10-CM | POA: Diagnosis not present

## 2021-03-07 DIAGNOSIS — R109 Unspecified abdominal pain: Secondary | ICD-10-CM | POA: Diagnosis not present

## 2021-03-07 DIAGNOSIS — R0602 Shortness of breath: Secondary | ICD-10-CM | POA: Diagnosis not present

## 2021-03-07 DIAGNOSIS — R0902 Hypoxemia: Secondary | ICD-10-CM | POA: Diagnosis not present

## 2021-03-07 NOTE — ED Triage Notes (Signed)
Patient arrived by EMS complaining of left flank pain. Patient has hx of stones PER EMS

## 2021-03-07 NOTE — ED Notes (Signed)
IV removed by Tina(NT) by seen leaving.

## 2021-03-10 ENCOUNTER — Ambulatory Visit: Payer: Medicare Other | Admitting: Internal Medicine

## 2021-03-10 ENCOUNTER — Other Ambulatory Visit: Payer: Self-pay

## 2021-03-10 ENCOUNTER — Encounter: Payer: Self-pay | Admitting: Internal Medicine

## 2021-03-10 VITALS — BP 170/82 | HR 65 | Ht 59.0 in | Wt 137.6 lb

## 2021-03-10 DIAGNOSIS — I340 Nonrheumatic mitral (valve) insufficiency: Secondary | ICD-10-CM | POA: Diagnosis not present

## 2021-03-10 DIAGNOSIS — I4819 Other persistent atrial fibrillation: Secondary | ICD-10-CM | POA: Diagnosis not present

## 2021-03-10 DIAGNOSIS — I5032 Chronic diastolic (congestive) heart failure: Secondary | ICD-10-CM

## 2021-03-10 DIAGNOSIS — I10 Essential (primary) hypertension: Secondary | ICD-10-CM

## 2021-03-10 NOTE — Progress Notes (Signed)
PCP: Irena Reichmann, DO Primary Cardiologist: Dr Annamaria Helling Primary EP: Dr Johney Frame  Virginia Gilbert is a 80 y.o. female who presents today for routine electrophysiology followup.  She is losing weight and has poor appetite.  She is fatigued.  The cause is not clear.  Workup is ongoing with PCP.  Today, she denies symptoms of palpitations, chest pain, shortness of breath,  lower extremity edema, dizziness, presyncope, or syncope.  The patient is otherwise without complaint today.   Past Medical History:  Diagnosis Date   Diabetes mellitus without complication (HCC)    Hyperlipidemia    Hypertension    Mitral regurgitation    evaluated by TEE 09/2019   Persistent atrial fibrillation (HCC)    Past Surgical History:  Procedure Laterality Date   ATRIAL FIBRILLATION ABLATION N/A 12/02/2019   Procedure: ATRIAL FIBRILLATION ABLATION;  Surgeon: Hillis Range, MD;  Location: [facility] INVASIVE CV LAB;  Service: Cardiovascular;  Laterality: N/A;   BUBBLE STUDY  10/08/2019   Procedure: BUBBLE STUDY;  Surgeon: [name], MD;  Location: [facility] ENDOSCOPY;  Service: Cardiology;;   CARDIOVERSION N/A 07/08/2019   Procedure: CARDIOVERSION;  Surgeon: Yates Decamp, MD;  Location: [facility] ENDOSCOPY;  Service: Cardiovascular;  Laterality: N/A;   CARDIOVERSION N/A 09/02/2019   Procedure: CARDIOVERSION;  Surgeon: Yates Decamp, MD;  Location: [facility] ENDOSCOPY;  Service: Cardiovascular;  Laterality: N/A;   CARDIOVERSION N/A 01/13/2020   Procedure: CARDIOVERSION;  Surgeon: Sande Rives, MD;  Location: [facility] ENDOSCOPY;  Service: Cardiovascular;  Laterality: N/A;   CATARACT EXTRACTION, BILATERAL     PARTIAL HYSTERECTOMY     1977   TEE WITHOUT CARDIOVERSION N/A 10/08/2019   Procedure: TRANSESOPHAGEAL ECHOCARDIOGRAM (TEE);  Surgeon: [name], MD;  Location: [facility] ENDOSCOPY;  Service: Cardiology;  Laterality: N/A;    ROS- all systems are reviewed and negatives except as per HPI above  Current Outpatient Medications   Medication Sig Dispense Refill   acetaminophen (TYLENOL) 500 MG tablet Take 500 mg by mouth every 8 (eight) hours as needed for moderate pain or headache.      carvedilol (COREG) 6.25 MG tablet Take 1 tablet (6.25 mg total) by mouth 2 (two) times daily. 180 tablet 3   Cholecalciferol (VITAMIN D-3) 125 MCG (5000 UT) TABS Take 5,000 Units by mouth daily with lunch.      Cyanocobalamin (B-12) 2500 MCG TABS Take 2,500 mcg by mouth daily with lunch.      ELIQUIS 5 MG TABS tablet Take 1 tablet by mouth twice daily 90 tablet 0   ezetimibe (ZETIA) 10 MG tablet Take 10 mg by mouth daily.     ferrous gluconate (FERGON) 240 (27 FE) MG tablet 1 tablet with water or juice between meals     furosemide (LASIX) 20 MG tablet Take 1-2 tablets (20-40 mg total) by mouth 2 (two) times daily. Take 2 tablets (40 mg total) in the mornings and 1 tablet (20 mg total) in the evenings. 360 tablet 3   glimepiride (AMARYL) 4 MG tablet Take 1 tablet (4 mg total) by mouth daily with breakfast.  1   KLOR-CON M20 20 MEQ tablet Take 20 mEq by mouth daily.     LORazepam (ATIVAN) 0.5 MG tablet Take 0.5 mg by mouth 4 (four) times daily as needed.     losartan (COZAAR) 100 MG tablet Take 100 mg by mouth daily.   6   metFORMIN (GLUCOPHAGE) 1000 MG tablet Take 1,000 mg by mouth 2 (two) times daily with a meal.  6   ondansetron (ZOFRAN) 8 MG tablet Take 8 mg by mouth as needed.     pantoprazole (PROTONIX) 40 MG tablet Take 1 tablet by mouth 2 (two) times daily.     TRULICITY 1.5 MG/0.5ML SOPN Inject 1.5 mg into the skin every Friday.      rosuvastatin (CRESTOR) 20 MG tablet Take 20 mg by mouth daily.     No current facility-administered medications for this visit.    Physical Exam: Vitals:   03/10/21 1510  BP: (!) 170/82  Pulse: 65  SpO2: 94%  Weight: 137 lb 9.6 oz (62.4 kg)  Height: 4\' 11"  (1.499 m)    GEN- The patient is well appearing, alert and oriented x 3 today.   Head- normocephalic, atraumatic Eyes-  Sclera  clear, conjunctiva pink Ears- hearing intact Oropharynx- clear Lungs- Clear to ausculation bilaterally, normal work of breathing Heart- Regular rate and rhythm, no murmurs, rubs or gallops, PMI not laterally displaced GI- soft, NT, ND, + BS Extremities- no clubbing, cyanosis, or edema  Wt Readings from Last 3 Encounters:  03/10/21 137 lb 9.6 oz (62.4 kg)  12/07/20 147 lb (66.7 kg)  09/07/20 152 lb 12.8 oz (69.3 kg)    EKG tracing ordered today is personally reviewed and shows sinus  Assessment and Plan:  Persistent afib Doing well Now off of amiodarone Chads2vasc score is 5.  She is on eliquis  2. Chronic diastolic dysfunction Euvolemic No changes  3. HTN Stable No change required today  4. Mitral regurgitation Follows with Dr 09/09/20  Risks, benefits and potential toxicities for medications prescribed and/or refilled reviewed with patient today.   Return to see EP APP annually  Annamaria Helling MD, [facility] 03/10/2021 3:12 PM

## 2021-03-10 NOTE — Patient Instructions (Addendum)
Medication Instructions:  Your physician recommends that you continue on your current medications as directed. Please refer to the Current Medication list given to you today.  Labwork: None ordered.  Testing/Procedures: None ordered.  Follow-Up: Your physician wants you to follow-up in: 12 months with  James Allred, MD or one of the following Advanced Practice Providers on your designated Care Team:    Renee Ursuy, PA-C    You will receive a reminder letter in the mail two months in advance. If you don't receive a letter, please call our office to schedule the follow-up appointment.   Any Other Special Instructions Will Be Listed Below (If Applicable).  If you need a refill on your cardiac medications before your next appointment, please call your pharmacy.        

## 2021-03-21 DIAGNOSIS — E118 Type 2 diabetes mellitus with unspecified complications: Secondary | ICD-10-CM | POA: Diagnosis not present

## 2021-03-21 DIAGNOSIS — E7800 Pure hypercholesterolemia, unspecified: Secondary | ICD-10-CM | POA: Diagnosis not present

## 2021-03-22 ENCOUNTER — Other Ambulatory Visit: Payer: Self-pay | Admitting: *Deleted

## 2021-03-22 ENCOUNTER — Other Ambulatory Visit: Payer: Self-pay | Admitting: Physician Assistant

## 2021-03-22 MED ORDER — APIXABAN 5 MG PO TABS: 5.0000 mg | ORAL_TABLET | Freq: Two times a day (BID) | ORAL | 1 refills | Status: DC

## 2021-03-22 NOTE — Telephone Encounter (Signed)
Prescription refill request for Eliquis received. Indication: Afib  Last office visit: 03/10/21 (Allred)  Scr: 0.88 (08/23/20)  Age: 80 Weight: 62.4kg  Appropriate dose and refill sent to requested pharmacy.  

## 2021-03-28 DIAGNOSIS — E118 Type 2 diabetes mellitus with unspecified complications: Secondary | ICD-10-CM | POA: Diagnosis not present

## 2021-03-28 DIAGNOSIS — K2100 Gastro-esophageal reflux disease with esophagitis, without bleeding: Secondary | ICD-10-CM | POA: Diagnosis not present

## 2021-03-28 DIAGNOSIS — I10 Essential (primary) hypertension: Secondary | ICD-10-CM | POA: Diagnosis not present

## 2021-03-28 DIAGNOSIS — E7800 Pure hypercholesterolemia, unspecified: Secondary | ICD-10-CM | POA: Diagnosis not present

## 2021-03-28 DIAGNOSIS — I4891 Unspecified atrial fibrillation: Secondary | ICD-10-CM | POA: Diagnosis not present

## 2021-05-20 IMAGING — CT CT CHEST W/O CM
2 of 4 series · 15 of 36 positions shown, 18 images · non-contrast
Comparison: 12/04/2019, 11/25/2019

CLINICAL DATA: Hypoxemia, recent cardiac ablation for atrial
fibrillation

EXAM:
CT CHEST WITHOUT CONTRAST
TECHNIQUE: Multidetector CT imaging of the chest was performed following the
standard protocol without IV contrast.

[Series 3: chest wo · axial · 0.79mm/px · z∈[-37,+189]mm · 12 of 135 slices shown, 15 images]
[im 11/135  mediastinal]
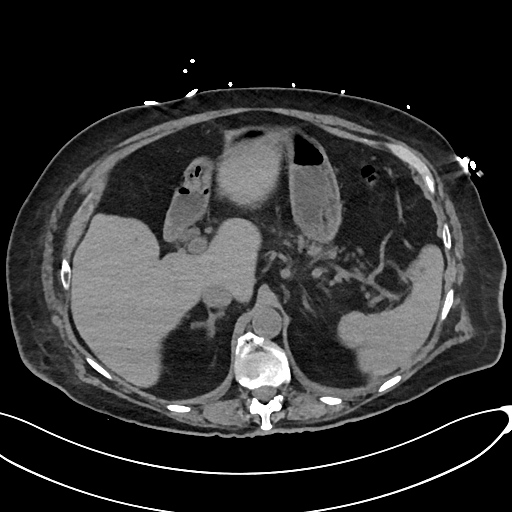
[im 11/135  lung]
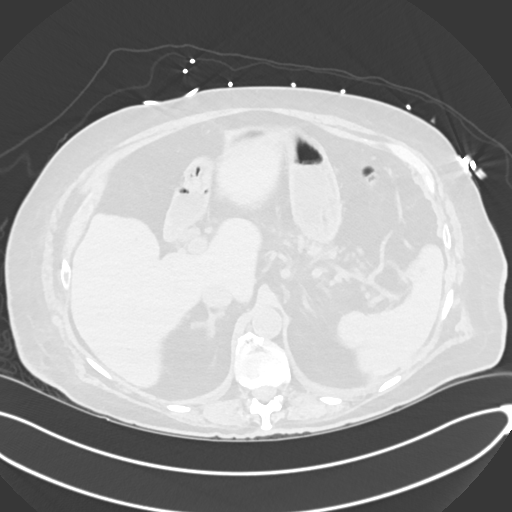
[im 21/135  lung]
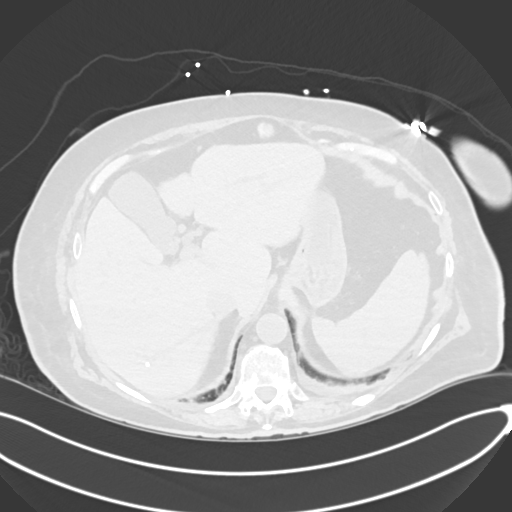
[im 31/135  lung]
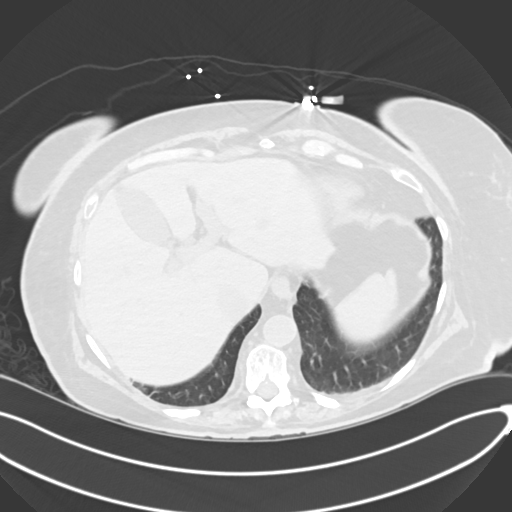
[im 42/135  lung]
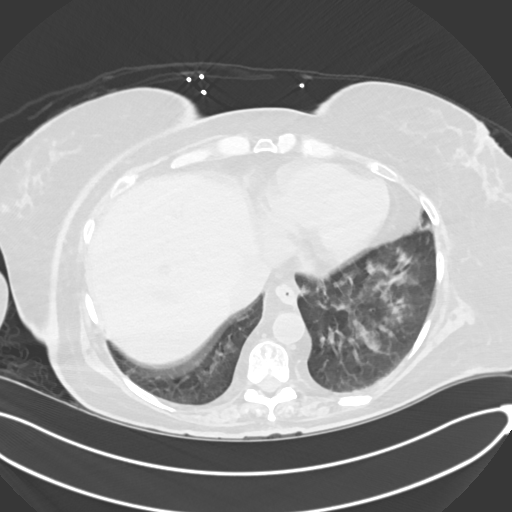
[im 52/135  mediastinal]
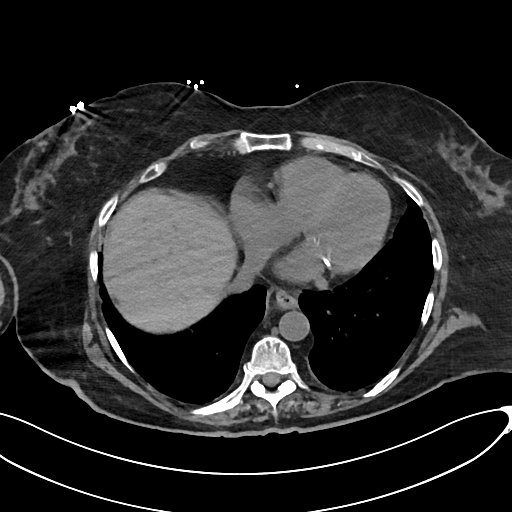
[im 52/135  lung]
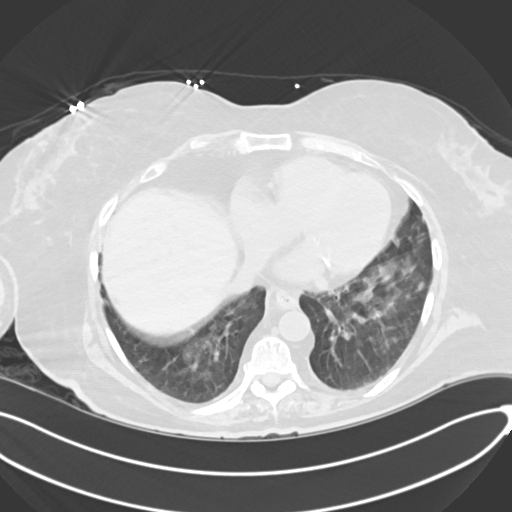
[im 62/135  lung]
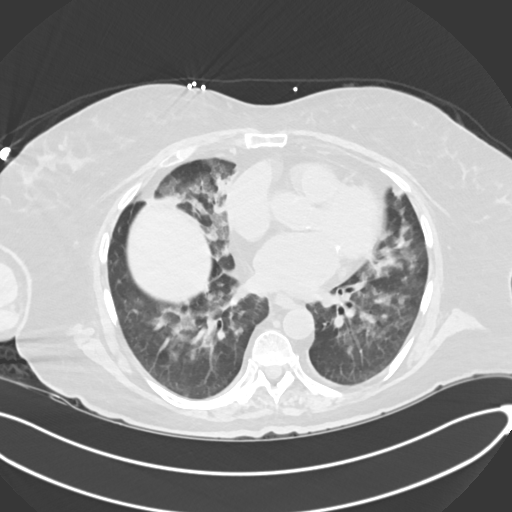
[im 73/135  lung]
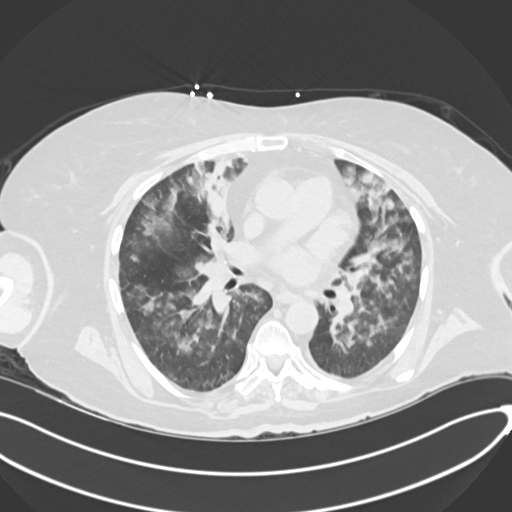
[im 83/135  lung]
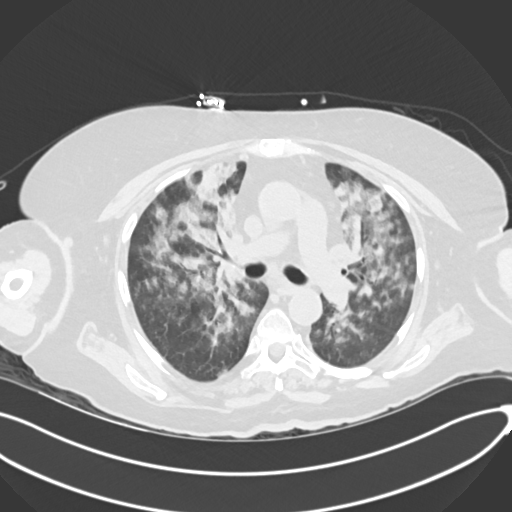
[im 93/135  mediastinal]
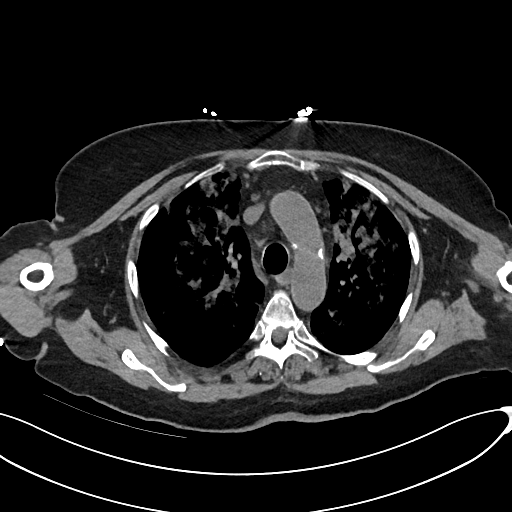
[im 93/135  lung]
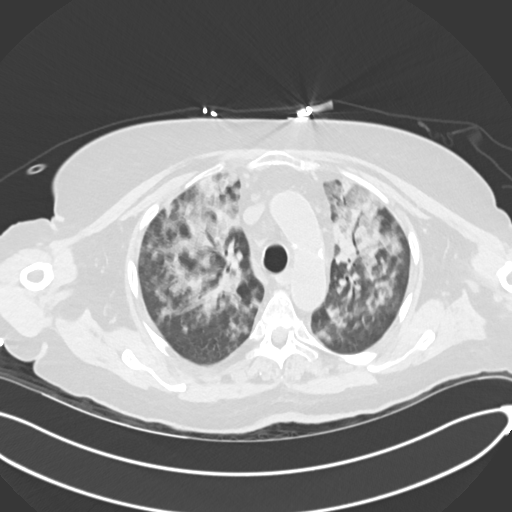
[im 104/135  lung]
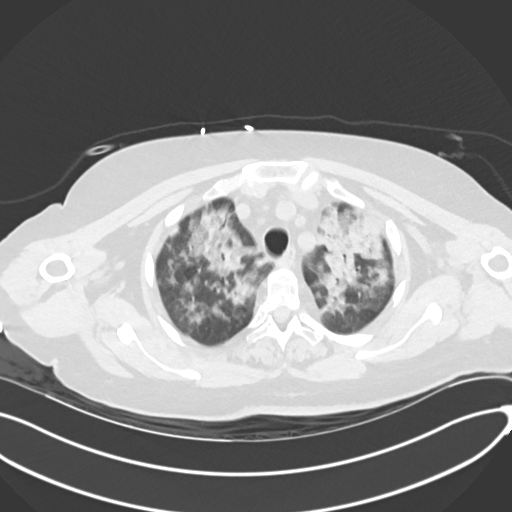
[im 114/135  lung]
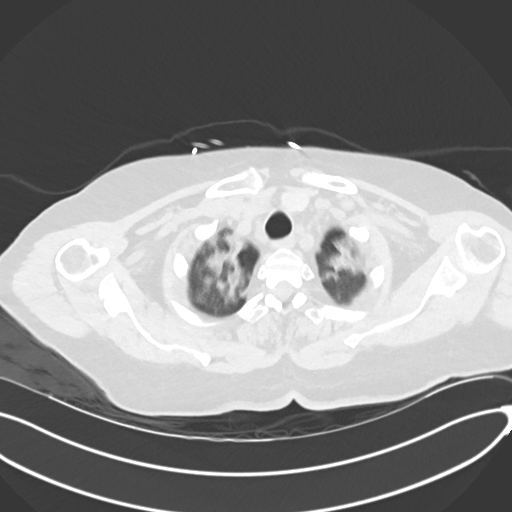
[im 124/135  lung]
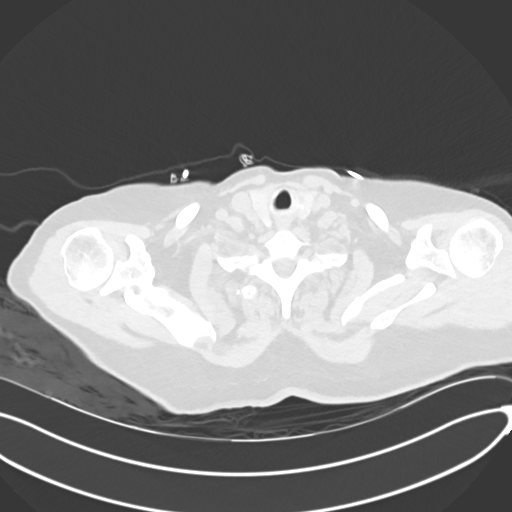

[Series 6: cor · coronal · 0.55mm/px · 3 of 151 slices shown]
[im 31/151  lung]
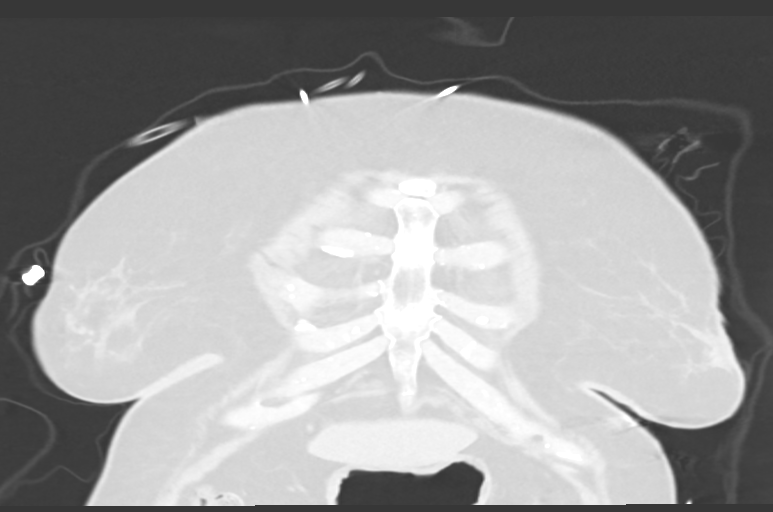
[im 61/151  lung]
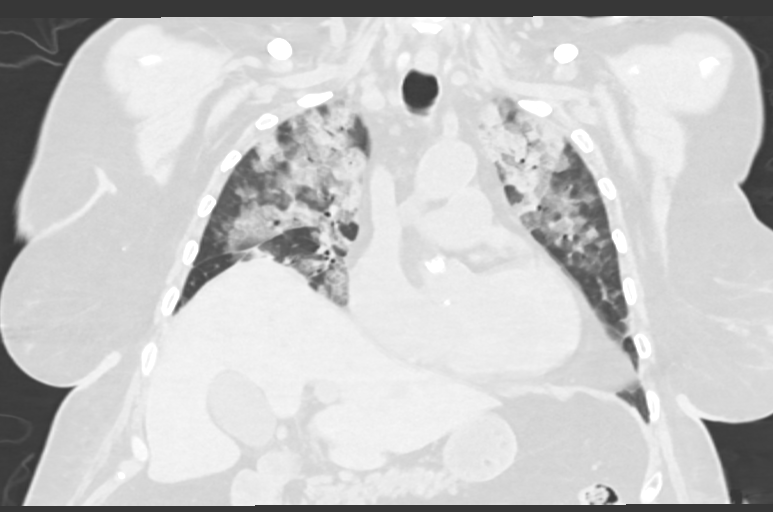
[im 91/151  lung]
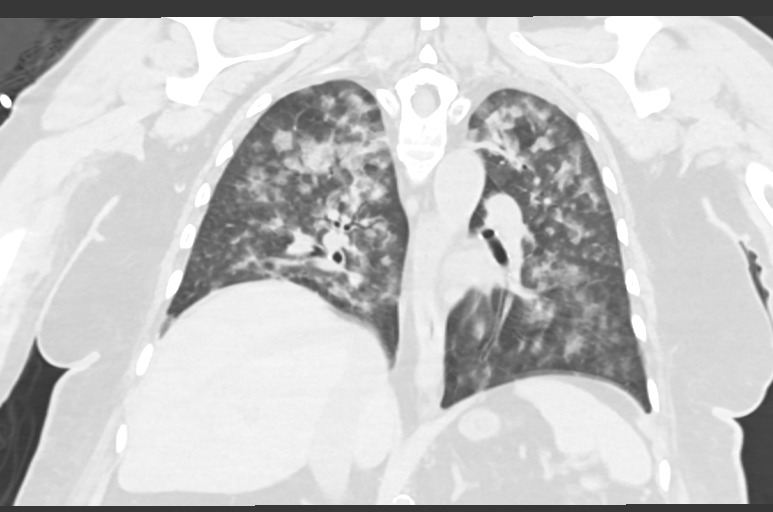

[15 of 36 positions shown; findings below may reference images not displayed]

FINDINGS: Cardiovascular: Unenhanced imaging of the heart and great vessels
demonstrates no pericardial effusion. Stable calcification of the
mitral and aortic valves. Mild atherosclerosis of the aorta and
coronary vessels. Thoracic aorta is normal in caliber.

Mediastinum/Nodes: Multiple subcentimeter mediastinal lymph nodes
are seen, new since prior study and likely reactive. Thyroid,
trachea, and esophagus are grossly unremarkable.

Lungs/Pleura: Since the previous exam, bilateral perihilar airspace
disease has developed, greatest in the upper lung zones. There is
interlobular septal thickening. Trace bilateral pleural effusions.
No pneumothorax. Central airways are patent.

Upper Abdomen: No acute abnormality. There are small bilateral
adrenal myelolipomas, measuring 11 mm on the right and 17 mm on the
left.

Musculoskeletal: No acute or destructive bony lesions. Reconstructed
images demonstrate no additional findings.
IMPRESSION: 1. Interval development of bilateral perihilar airspace disease the,
favor edema over infection.
2.  Aortic Atherosclerosis ([id number]).

## 2021-05-21 IMAGING — DX DG CHEST 1V PORT
1 series · 1 of 1 positions shown · non-contrast
Comparison: Chest CT and radiographs 12/04/2019 and earlier.

CLINICAL DATA: 78-year-old female with increasing shortness of
breath, abnormal pulmonary auscultation. Status post Lasix x1.
recent cardiac ablation.

EXAM:
PORTABLE CHEST 1 VIEW

[chest]
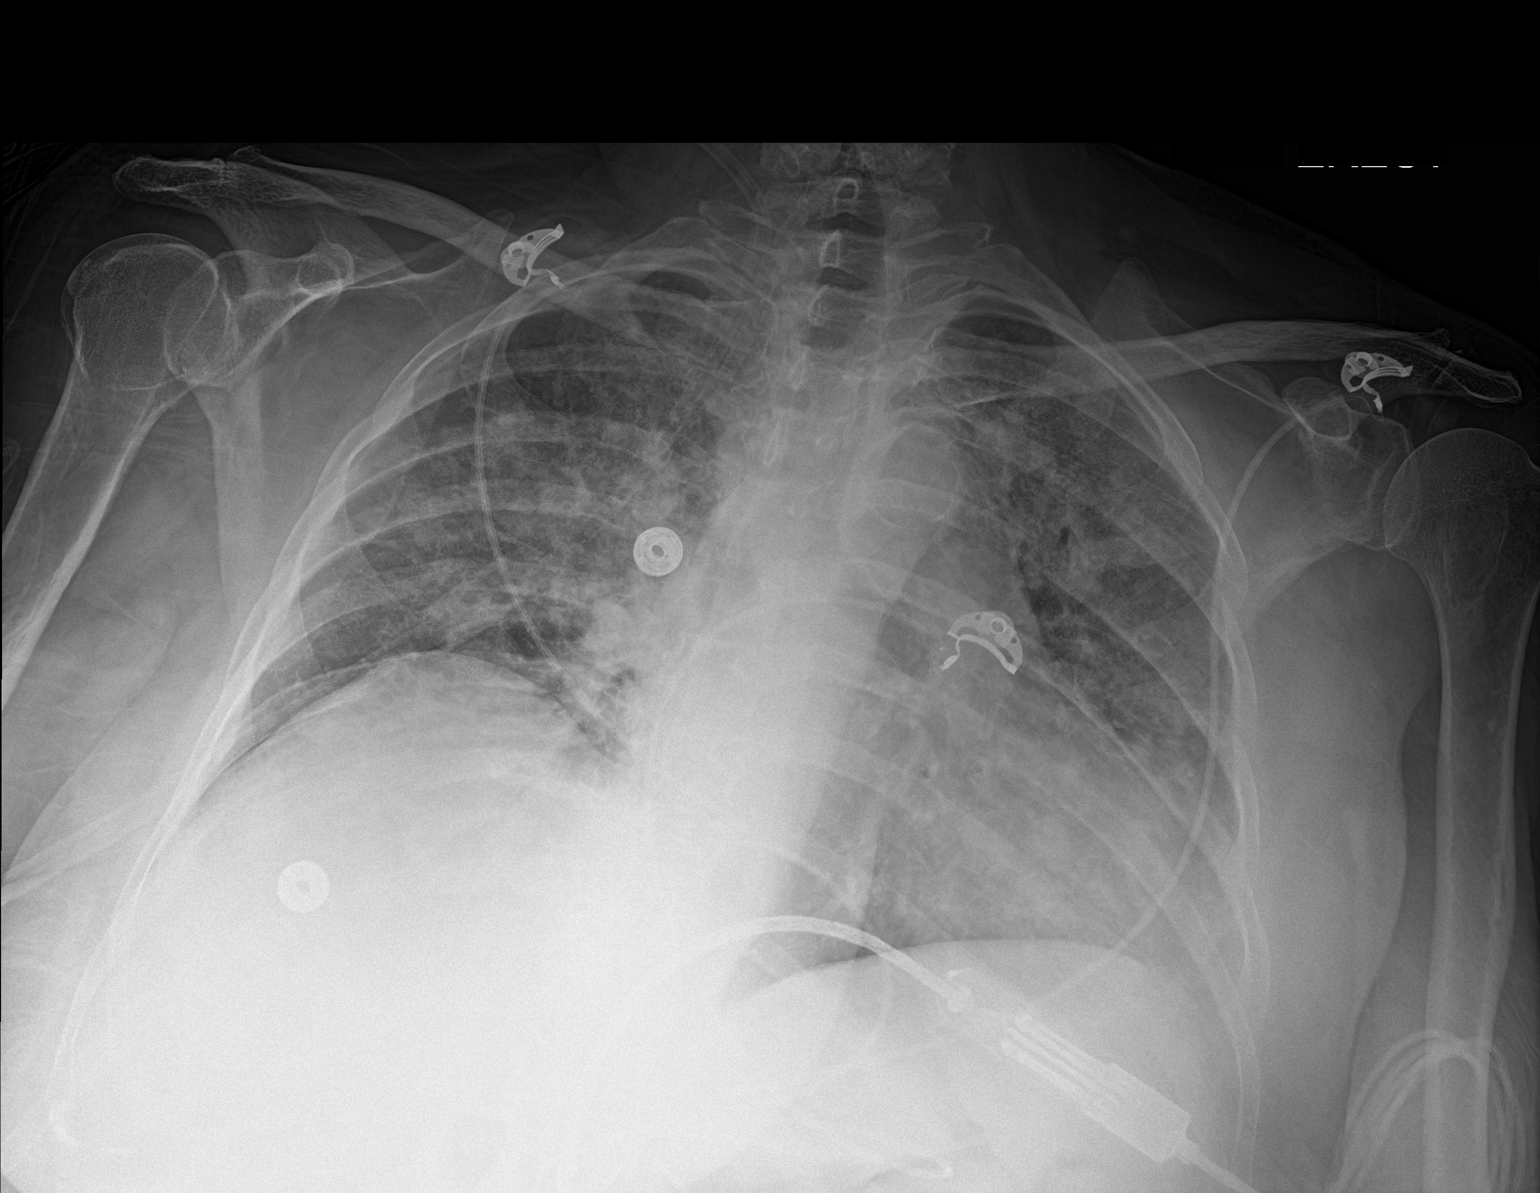

[1 of 1 positions shown; findings below may reference images not displayed]

FINDINGS: Portable AP semi upright view at 1777 hours. Continued low lung
volumes. Stable cardiac size and mediastinal contours. Visualized
tracheal air column is within normal limits. No change since
yesterday in bilateral confluent pulmonary interstitial opacity,
corresponding to upper lobe predominant bilateral peribronchial
mostly ground-glass opacity on CT. No pleural effusion is evident.
No areas of worsening ventilation. Paucity of bowel gas. No acute
osseous abnormality identified.
IMPRESSION: Unchanged bilateral pulmonary opacity since yesterday. No pleural
fluid is evident.

Consider viral/atypical infection if the patient does not respond to
diuresis.

## 2021-06-16 DIAGNOSIS — Z23 Encounter for immunization: Secondary | ICD-10-CM | POA: Diagnosis not present

## 2021-06-25 ENCOUNTER — Other Ambulatory Visit: Payer: Self-pay | Admitting: Internal Medicine

## 2021-06-27 NOTE — Telephone Encounter (Signed)
Prescription refill request for Eliquis received. Indication: Afib  Last office visit: 03/10/21 (Allred)  Scr: 0.88 (08/23/20)  Age: 80 Weight: 62.4kg  Appropriate dose and refill sent to requested pharmacy.

## 2021-07-25 DIAGNOSIS — E118 Type 2 diabetes mellitus with unspecified complications: Secondary | ICD-10-CM | POA: Diagnosis not present

## 2021-08-01 ENCOUNTER — Ambulatory Visit: Payer: Medicare Other | Admitting: Adult Health

## 2021-08-01 DIAGNOSIS — E118 Type 2 diabetes mellitus with unspecified complications: Secondary | ICD-10-CM | POA: Diagnosis not present

## 2021-08-01 DIAGNOSIS — I4891 Unspecified atrial fibrillation: Secondary | ICD-10-CM | POA: Diagnosis not present

## 2021-08-01 DIAGNOSIS — E7800 Pure hypercholesterolemia, unspecified: Secondary | ICD-10-CM | POA: Diagnosis not present

## 2021-08-01 DIAGNOSIS — M791 Myalgia, unspecified site: Secondary | ICD-10-CM | POA: Diagnosis not present

## 2021-08-01 DIAGNOSIS — I10 Essential (primary) hypertension: Secondary | ICD-10-CM | POA: Diagnosis not present

## 2021-08-01 DIAGNOSIS — K2100 Gastro-esophageal reflux disease with esophagitis, without bleeding: Secondary | ICD-10-CM | POA: Diagnosis not present

## 2021-08-01 DIAGNOSIS — Z789 Other specified health status: Secondary | ICD-10-CM | POA: Diagnosis not present

## 2021-08-09 ENCOUNTER — Telehealth: Payer: Self-pay | Admitting: Internal Medicine

## 2021-08-09 NOTE — Telephone Encounter (Addendum)
°*  STAT* If patient is at the pharmacy, call can be transferred to refill team.   1. Which medications need to be refilled? (please list name of each medication and dose if known)  ELIQUIS 5 MG TABS tablet  2. Which pharmacy/location (including street and city if local pharmacy) is medication to be sent to? [id number] - [place], [place] - [address]  3. Do they need a 30 day or 90 day supply? 90   Patient said her insurance will cover a 90 day supply (180 pills). She would like the office to write the rx accordingly

## 2021-08-10 MED ORDER — APIXABAN 5 MG PO TABS: 5.0000 mg | ORAL_TABLET | Freq: Two times a day (BID) | ORAL | 1 refills | Status: DC

## 2021-08-10 NOTE — Telephone Encounter (Signed)
Prescription refill request for Eliquis received. Indication: a fib Last office visit: 03/10/21 Scr: 1.22 Age: 80 Weight:  62kg

## 2021-08-26 ENCOUNTER — Telehealth: Payer: Self-pay

## 2021-08-26 NOTE — Telephone Encounter (Signed)
Notes scanned to referral 

## 2021-08-30 ENCOUNTER — Encounter: Payer: Self-pay | Admitting: Cardiology

## 2021-08-30 ENCOUNTER — Ambulatory Visit: Payer: Medicare Other | Admitting: Cardiology

## 2021-08-30 ENCOUNTER — Other Ambulatory Visit: Payer: Self-pay

## 2021-08-30 VITALS — BP 132/70 | HR 62 | Ht 59.0 in | Wt 137.8 lb

## 2021-08-30 DIAGNOSIS — I10 Essential (primary) hypertension: Secondary | ICD-10-CM

## 2021-08-30 DIAGNOSIS — R0602 Shortness of breath: Secondary | ICD-10-CM | POA: Diagnosis not present

## 2021-08-30 DIAGNOSIS — I5032 Chronic diastolic (congestive) heart failure: Secondary | ICD-10-CM

## 2021-08-30 DIAGNOSIS — I480 Paroxysmal atrial fibrillation: Secondary | ICD-10-CM | POA: Diagnosis not present

## 2021-08-30 NOTE — Patient Instructions (Signed)
Medication Instructions:  Your physician recommends that you continue on your current medications as directed. Please refer to the Current Medication list given to you today.  *If you need a refill on your cardiac medications before your next appointment, please call your pharmacy*   Lab Work: Today: BNP   If you have labs (blood work) drawn today and your tests are completely normal, you will receive your results only by: [place] (if you have MyChart) OR A paper copy in the mail If you have any lab test that is abnormal or we need to change your treatment, we will call you to review the results.   Testing/Procedures: None ordered   Follow-Up: At [facility], you and your health needs are our priority.  As part of our continuing mission to provide you with exceptional heart care, we have created designated Provider Care Teams.  These Care Teams include your primary Cardiologist (physician) and Advanced Practice Providers (APPs -  Physician Assistants and Nurse Practitioners) who all work together to provide you with the care you need, when you need it.   Your next appointment:   To be  determined after Dr. Curt Bears receives more information from Deon Pilling, NP  The format for your next appointment:   In Person  Provider:   Allegra Lai, MD    Thank you for choosing [facility]!!   Trinidad Curet, RN [phone]

## 2021-08-30 NOTE — Progress Notes (Signed)
Electrophysiology Office Note   Date:  08/30/2021   ID:  MCKYNLIE DIPIETRANTONIO, DOB 1940/12/29, MRN [id number]  PCP:  Janie Morning, DO  Cardiologist:  Schuyler Amor Primary Electrophysiologist:  Teaghan Melrose Meredith Leeds, MD    Chief Complaint: AF   History of Present Illness: Virginia Gilbert is a 81 y.o. female who is being seen today for the evaluation of AF at the request of Janie Morning, DO. Presenting today for electrophysiology evaluation.  She has a history significant for diabetes, hypertension, hyperlipidemia, persistent atrial fibrillation.  She is status post atrial fibrillation ablation 12/02/2019 with PVI and posterior wall isolation.  When she last saw Dr. Rayann Heman, she was losing weight with poor appetite and fatigue.  Today, she denies symptoms of palpitations, chest pain, orthopnea, PND, lower extremity edema, claudication, dizziness, presyncope, syncope, bleeding, or neurologic sequela. The patient is tolerating medications without difficulties.  Her main complaint is fatigue and shortness of breath.  She states that this started 3 weeks ago.  She had decreased oral intake last summer, and this is also continued.  She is having difficulty doing any of her daily activities due to her level of fatigue.  She is also been losing weight.   Past Medical History:  Diagnosis Date   Diabetes mellitus without complication ([place])    Hyperlipidemia    Hypertension    Mitral regurgitation    evaluated by TEE 09/2019   Persistent atrial fibrillation ([place])    Past Surgical History:  Procedure Laterality Date   ATRIAL FIBRILLATION ABLATION N/A 12/02/2019   Procedure: ATRIAL FIBRILLATION ABLATION;  Surgeon: Thompson Grayer, MD;  Location: [place] CV LAB;  Service: Cardiovascular;  Laterality: N/A;   BUBBLE STUDY  10/08/2019   Procedure: BUBBLE STUDY;  Surgeon: Elouise Munroe, MD;  Location: [facility] ENDOSCOPY;  Service: Cardiology;;   CARDIOVERSION N/A 07/08/2019   Procedure: CARDIOVERSION;  Surgeon:  Adrian Prows, MD;  Location: [facility] ENDOSCOPY;  Service: Cardiovascular;  Laterality: N/A;   CARDIOVERSION N/A 09/02/2019   Procedure: CARDIOVERSION;  Surgeon: Adrian Prows, MD;  Location: [facility] ENDOSCOPY;  Service: Cardiovascular;  Laterality: N/A;   CARDIOVERSION N/A 01/13/2020   Procedure: CARDIOVERSION;  Surgeon: Geralynn Rile, MD;  Location: [place];  Service: Cardiovascular;  Laterality: N/A;   CATARACT EXTRACTION, BILATERAL     PARTIAL HYSTERECTOMY     1977   TEE WITHOUT CARDIOVERSION N/A 10/08/2019   Procedure: TRANSESOPHAGEAL ECHOCARDIOGRAM (TEE);  Surgeon: Elouise Munroe, MD;  Location: [place];  Service: Cardiology;  Laterality: N/A;     Current Outpatient Medications  Medication Sig Dispense Refill   acetaminophen (TYLENOL) 500 MG tablet Take 500 mg by mouth every 8 (eight) hours as needed for moderate pain or headache.      apixaban (ELIQUIS) 5 MG TABS tablet Take 1 tablet (5 mg total) by mouth 2 (two) times daily. 180 tablet 1   carvedilol (COREG) 6.25 MG tablet Take 6.25 mg by mouth 2 (two) times daily.     Cholecalciferol (VITAMIN D-3) 125 MCG (5000 UT) TABS Take 5,000 Units by mouth daily with lunch.      Cyanocobalamin (B-12) 2500 MCG TABS Take 2,500 mcg by mouth daily with lunch.      cyclobenzaprine (FLEXERIL) 5 MG tablet Take 5-10 mg by mouth at bedtime as needed. As directed     ezetimibe (ZETIA) 10 MG tablet Take 10 mg by mouth daily.     ferrous gluconate (FERGON) 240 (27 FE) MG tablet 1 tablet with water or  juice between meals     furosemide (LASIX) 20 MG tablet Take 1-2 tablets (20-40 mg total) by mouth 2 (two) times daily. Take 2 tablets (40 mg total) in the mornings and 1 tablet (20 mg total) in the evenings. 360 tablet 3   glimepiride (AMARYL) 4 MG tablet Take 1 tablet (4 mg total) by mouth daily with breakfast.  1   JARDIANCE 10 MG TABS tablet Take 10 mg by mouth daily.     KLOR-CON M20 20 MEQ tablet Take 20 mEq by mouth daily.     LORazepam (ATIVAN)  0.5 MG tablet Take 0.5 mg by mouth 4 (four) times daily as needed.     losartan (COZAAR) 100 MG tablet Take 100 mg by mouth daily.   6   metFORMIN (GLUCOPHAGE) 1000 MG tablet Take 1,000 mg by mouth 2 (two) times daily with a meal.   6   ondansetron (ZOFRAN) 8 MG tablet Take 8 mg by mouth as needed.     pantoprazole (PROTONIX) 40 MG tablet Take 1 tablet by mouth 2 (two) times daily.     rosuvastatin (CRESTOR) 20 MG tablet Take 20 mg by mouth daily.     TRULICITY 1.5 0000000 SOPN Inject 1.5 mg into the skin every Friday.      No current facility-administered medications for this visit.    Allergies:   Penicillins, Tramadol, and Codeine   Social History:  The patient  reports that she quit smoking about 36 years ago. Her smoking use included cigarettes. She has a 7.50 pack-year smoking history. She has never used smokeless tobacco. She reports that she does not drink alcohol and does not use drugs.   Family History:  The patient's family history includes Breast cancer in her daughter and daughter; Diabetes in her brother and mother; Emphysema in her brother and father; Heart disease in her brother, brother, and mother.    ROS:  Please see the history of present illness.   Otherwise, review of systems is positive for none.   All other systems are reviewed and negative.    PHYSICAL EXAM: VS:  BP 132/70    Pulse 62    Ht 4\' 11"  (1.499 m)    Wt 137 lb 12.8 oz (62.5 kg)    SpO2 98%    BMI 27.83 kg/m  , BMI Body mass index is 27.83 kg/m. GEN: Well nourished, well developed, in no acute distress  HEENT: normal  Neck: no JVD, carotid bruits, or masses Cardiac: RRR; no murmurs, rubs, or gallops,no edema  Respiratory:  clear to auscultation bilaterally, normal work of breathing GI: soft, nontender, nondistended, + BS MS: no deformity or atrophy  Skin: warm and dry Neuro:  Strength and sensation are intact Psych: euthymic mood, full affect  EKG:  EKG is ordered today. Personal review of the  ekg ordered shows sinus rhythm   Recent Labs: No results found for requested labs within last 8760 hours.    Lipid Panel  No results found for: CHOL, TRIG, HDL, CHOLHDL, VLDL, LDLCALC, LDLDIRECT   Wt Readings from Last 3 Encounters:  08/30/21 137 lb 12.8 oz (62.5 kg)  03/10/21 137 lb 9.6 oz (62.4 kg)  12/07/20 147 lb (66.7 kg)      Other studies Reviewed: Additional studies/ records that were reviewed today include: TTE 12/05/19  Review of the above records today demonstrates:   1. Patient appears to be in atrial flutter throughout exam.   2. Left ventricular ejection fraction, by estimation, is  60 to 65%. The  left ventricle has normal function. The left ventricle has no regional  wall motion abnormalities. Left ventricular diastolic parameters are  indeterminate.   3. Right ventricular systolic function is normal. The right ventricular  size is normal. There is mildly elevated pulmonary artery systolic  pressure.   4. Left atrial size was moderately dilated.   5. The mitral valve is normal in structure. Mild mitral valve  regurgitation. No evidence of mitral stenosis.   6. The aortic valve is tricuspid. Aortic valve regurgitation is trivial.  Mild to moderate aortic valve sclerosis/calcification is present, without  any evidence of aortic stenosis.   7. The inferior vena cava is normal in size with greater than 50%  respiratory variability, suggesting right atrial pressure of 3 mmHg.    ASSESSMENT AND PLAN:  1.  Persistent atrial fibrillation: CHA2DS2-VASc of 5.  Currently on Eliquis 5 mg twice daily.  Is status post ablation by Dr. Rayann Heman on 12/02/2019.  She is in sinus rhythm today.  She reports that she was in atrial fibrillation at her primary physician's office.  We Rae Sutcliffe work to get that ECG from their office.    2.  Chronic diastolic dysfunction: Does not appear to be volume overloaded.  She does have significant fatigue and shortness of breath.  We Karina Lenderman order a BNP.   She may require transthoracic echo if the BNP is elevated.  This may help to explain her level of fatigue.  3.  Hypertension: Currently well controlled.  She has been off all of her antihypertensives.    Current medicines are reviewed at length with the patient today.   The patient does not have concerns regarding her medicines.  The following changes were made today:  none  Labs/ tests ordered today include:  Orders Placed This Encounter  Procedures   Pro b natriuretic peptide (BNP)   EKG 12-Lead     Disposition:   FU with Jaimey Franchini 6 months  Signed, Oddie Kuhlmann Meredith Leeds, MD  08/30/2021 10:58 AM     [facility] [address] [place] [address] [phone] (office) [phone] (fax)

## 2021-08-31 LAB — PRO B NATRIURETIC PEPTIDE: NT-Pro BNP: 2175 pg/mL — ABNORMAL HIGH (ref 0–738)

## 2021-10-13 ENCOUNTER — Telehealth: Payer: Self-pay | Admitting: *Deleted

## 2021-10-13 DIAGNOSIS — I480 Paroxysmal atrial fibrillation: Secondary | ICD-10-CM

## 2021-10-13 DIAGNOSIS — R0602 Shortness of breath: Secondary | ICD-10-CM

## 2021-10-13 NOTE — Telephone Encounter (Signed)
Per Dr. Elberta Fortis:  Needs repeat TTE to check for low EF.  ? ?Pt aware office will call to schedule. ?Patient verbalized understanding and agreeable to plan.  ? ?

## 2021-10-21 ENCOUNTER — Other Ambulatory Visit: Payer: Self-pay

## 2021-10-21 ENCOUNTER — Ambulatory Visit (HOSPITAL_COMMUNITY): Payer: Medicare Other | Attending: Cardiology

## 2021-10-21 DIAGNOSIS — I480 Paroxysmal atrial fibrillation: Secondary | ICD-10-CM

## 2021-10-21 LAB — ECHOCARDIOGRAM COMPLETE
Area-P 1/2: 2.55 cm2
S' Lateral: 1.9 cm

## 2021-11-14 ENCOUNTER — Other Ambulatory Visit: Payer: Self-pay | Admitting: Internal Medicine

## 2021-11-23 ENCOUNTER — Ambulatory Visit: Payer: Medicare Other | Admitting: Podiatry

## 2021-11-23 ENCOUNTER — Encounter: Payer: Self-pay | Admitting: Podiatry

## 2021-11-23 DIAGNOSIS — M79675 Pain in left toe(s): Secondary | ICD-10-CM | POA: Diagnosis not present

## 2021-11-23 DIAGNOSIS — B351 Tinea unguium: Secondary | ICD-10-CM

## 2021-11-23 DIAGNOSIS — M79674 Pain in right toe(s): Secondary | ICD-10-CM | POA: Diagnosis not present

## 2021-11-23 NOTE — Progress Notes (Signed)
?  Subjective:  ?Patient ID: Virginia Gilbert, female    DOB: 29-Dec-1940,  MRN: [id number] ? ?Chief Complaint  ?Patient presents with  ? Nail Problem  ?  Nail trim   ? ?81 y.o. female returns for the above complaint.  Patient presents with thickened elongated dystrophic toenails x10.  Mild pain on palpation.  Patient would like to have the debrided down.  She is not able to do her self.  She denies any other acute complaints. ? ?Objective:  ?There were no vitals filed for this visit. ?Podiatric Exam: ?Vascular: dorsalis pedis and posterior tibial pulses are palpable bilateral. Capillary return is immediate. Temperature gradient is WNL. Skin turgor WNL  ?Sensorium: Normal Semmes Weinstein monofilament test. Normal tactile sensation bilaterally. ?Nail Exam: Pt has thick disfigured discolored nails with subungual debris noted bilateral entire nail hallux through fifth toenails.  Pain on palpation to the nails. ?Ulcer Exam: There is no evidence of ulcer or pre-ulcerative changes or infection. ?Orthopedic Exam: Muscle tone and strength are WNL. No limitations in general ROM. No crepitus or effusions noted.  ?Skin: No Porokeratosis. No infection or ulcers ? ? ? ?Assessment & Plan:  ? ?1. Pain due to onychomycosis of toenails of both feet   ? ? ?Patient was evaluated and treated and all questions answered. ? ?Onychomycosis with pain  ?-Nails palliatively debrided as below. ?-Educated on self-care ? ?Procedure: Nail Debridement ?Rationale: pain  ?Type of Debridement: manual, sharp debridement. ?Instrumentation: Nail nipper, rotary burr. ?Number of Nails: 10 ? ?Procedures and Treatment: Consent by patient was obtained for treatment procedures. The patient understood the discussion of treatment and procedures well. All questions were answered thoroughly reviewed. Debridement of mycotic and hypertrophic toenails, 1 through 5 bilateral and clearing of subungual debris. No ulceration, no infection noted.  ?Return Visit-Office  Procedure: Patient instructed to return to the office for a follow up visit 3 months for continued evaluation and treatment. ? ?Boneta Lucks, DPM ?  ? ?Return in about 3 months (around 02/22/2022). ? ?

## 2021-12-19 ENCOUNTER — Ambulatory Visit: Payer: Medicare Other | Admitting: Internal Medicine

## 2021-12-19 ENCOUNTER — Encounter: Payer: Self-pay | Admitting: Internal Medicine

## 2021-12-19 VITALS — BP 152/80 | HR 67 | Ht 59.0 in | Wt 139.4 lb

## 2021-12-19 DIAGNOSIS — D6869 Other thrombophilia: Secondary | ICD-10-CM | POA: Diagnosis not present

## 2021-12-19 DIAGNOSIS — Z79899 Other long term (current) drug therapy: Secondary | ICD-10-CM

## 2021-12-19 DIAGNOSIS — I340 Nonrheumatic mitral (valve) insufficiency: Secondary | ICD-10-CM

## 2021-12-19 DIAGNOSIS — I10 Essential (primary) hypertension: Secondary | ICD-10-CM

## 2021-12-19 DIAGNOSIS — R0789 Other chest pain: Secondary | ICD-10-CM

## 2021-12-19 DIAGNOSIS — I4819 Other persistent atrial fibrillation: Secondary | ICD-10-CM

## 2021-12-19 DIAGNOSIS — E782 Mixed hyperlipidemia: Secondary | ICD-10-CM

## 2021-12-19 DIAGNOSIS — I5032 Chronic diastolic (congestive) heart failure: Secondary | ICD-10-CM

## 2021-12-19 DIAGNOSIS — R531 Weakness: Secondary | ICD-10-CM

## 2021-12-19 MED ORDER — FUROSEMIDE 20 MG PO TABS: 20.0000 mg | ORAL_TABLET | Freq: Two times a day (BID) | ORAL | 3 refills | Status: DC

## 2021-12-19 NOTE — Patient Instructions (Signed)
Medication Instructions:  ?LASIX REFILLED  ?*If you need a refill on your cardiac medications before your next appointment, please call your pharmacy* ? ?Follow-Up: ?At [facility], you and your health needs are our priority.  As part of our continuing mission to provide you with exceptional heart care, we have created designated Provider Care Teams.  These Care Teams include your primary Cardiologist (physician) and Advanced Practice Providers (APPs -  Physician Assistants and Nurse Practitioners) who all work together to provide you with the care you need, when you need it. ? ?Your next appointment:   ?6 month(s) ? ?The format for your next appointment:   ?In Person ? ?Provider:   ?[name], MD   ? ? ? ? ? ? ? ? ?

## 2021-12-19 NOTE — Progress Notes (Signed)
?Cardiology Office Note:   ? ?Date:  12/19/2021  ? ?ID:  Virginia MillerLillie M Gilbert, DOB 11/26/1940, MRN [id number] ? ?PCP:  Irena Reichmannollins, Dana, DO  ?Cardiologist:  Parke PoissonGayatri A Emalyn Schou, MD  ?Electrophysiologist:  Hillis RangeJames Allred, MD  ? ?Referring MD: Irena Reichmannollins, Dana, DO  ? ?Chief Complaint/Reason for Referral: ?Afib, HFpEF ? ?History of Present Illness:   ? ?Virginia Gilbert is a 81 y.o. female with a history of afib s/p ablation on Eliquis, and HFpEF, as well as DM2.  ? ?She is feeling fair today.  She has had a period of approximately 5 weeks where she has not gotten her Lasix refilled due to lack of response from the pharmacy.  After about 3 weeks of not having her Lasix, she began to have chest pressure and weakness with exertion.  Only yesterday and today did she begin to feel better for unclear reasons.  She was recently diagnosed with a renal infection that was asymptomatic and was put on antibiotics which made her feel worse.  She does historically have some difficulties with intolerance to medications due to GI upset and feeling unwell. ? ?EKG not obtained today however most recent ECG from January documents sinus rhythm.  She feels she is not in atrial fibrillation today and is overall been doing well from that standpoint.  Amiodarone had to be discontinued due to GI upset however she is doing well without this medication at this time. ? ? ?Past Medical History:  ?Diagnosis Date  ? Diabetes mellitus without complication (HCC)   ? Hyperlipidemia   ? Hypertension   ? Mitral regurgitation   ? evaluated by TEE 09/2019  ? Persistent atrial fibrillation (HCC)   ? ? ?Past Surgical History:  ?Procedure Laterality Date  ? ATRIAL FIBRILLATION ABLATION N/A 12/02/2019  ? Procedure: ATRIAL FIBRILLATION ABLATION;  Surgeon: Hillis RangeAllred, James, MD;  Location: [facility] CV LAB;  Service: Cardiovascular;  Laterality: N/A;  ? BUBBLE STUDY  10/08/2019  ? Procedure: BUBBLE STUDY;  Surgeon: Parke PoissonAcharya, Severino Paolo A, MD;  [facility];  Service: Cardiology;;   ? CARDIOVERSION N/A 07/08/2019  ? Procedure: CARDIOVERSION;  Surgeon: Yates DecampGanji, Jay, MD;  [facility];  Service: Cardiovascular;  Laterality: N/A;  ? CARDIOVERSION N/A 09/02/2019  ? Procedure: CARDIOVERSION;  Surgeon: Yates DecampGanji, Jay, MD;  [facility];  Service: Cardiovascular;  Laterality: N/A;  ? CARDIOVERSION N/A 01/13/2020  ? Procedure: CARDIOVERSION;  Surgeon: Sande Rives'Neal, Brookside Thomas, MD;  [facility];  Service: Cardiovascular;  Laterality: N/A;  ? CATARACT EXTRACTION, BILATERAL    ? PARTIAL HYSTERECTOMY    ? 1977  ? TEE WITHOUT CARDIOVERSION N/A 10/08/2019  ? Procedure: TRANSESOPHAGEAL ECHOCARDIOGRAM (TEE);  Surgeon: Parke PoissonAcharya, Kristofor Michalowski A, MD;  [facility];  Service: Cardiology;  Laterality: N/A;  ? ? ?Current Medications: ?Current Meds  ?Medication Sig  ? acetaminophen (TYLENOL) 500 MG tablet Take 500 mg by mouth every 8 (eight) hours as needed for moderate pain or headache.   ? apixaban (ELIQUIS) 5 MG TABS tablet Take 1 tablet (5 mg total) by mouth 2 (two) times daily.  ? carvedilol (COREG) 6.25 MG tablet Take 6.25 mg by mouth 2 (two) times daily.  ? Cholecalciferol (VITAMIN D-3) 125 MCG (5000 UT) TABS Take 5,000 Units by mouth daily with lunch.   ? Cyanocobalamin (B-12) 2500 MCG TABS Take 2,500 mcg by mouth daily with lunch.   ? cyclobenzaprine (FLEXERIL) 5 MG tablet Take 5-10 mg by mouth at bedtime as needed. As directed  ? ezetimibe (ZETIA) 10 MG  tablet Take 10 mg by mouth daily.  ? ferrous gluconate (FERGON) 240 (27 FE) MG tablet 1 tablet with water or juice between meals  ? glimepiride (AMARYL) 4 MG tablet Take 1 tablet (4 mg total) by mouth daily with breakfast.  ? LORazepam (ATIVAN) 0.5 MG tablet Take 0.5 mg by mouth 4 (four) times daily as needed.  ? losartan (COZAAR) 100 MG tablet Take 100 mg by mouth daily.   ? metFORMIN (GLUCOPHAGE) 1000 MG tablet Take 1,000 mg by mouth 2 (two) times daily with a meal.   ? rosuvastatin (CRESTOR) 20 MG tablet Take 20 mg by mouth daily.   ? [DISCONTINUED] furosemide (LASIX) 20 MG tablet Take 1-2 tablets (20-40 mg total) by mouth 2 (two) times daily. Take 2 tablets (40 mg total) in the mornings and 1 tablet (20 mg total) in the evenings.  ?  ? ?Allergies:   Penicillins, Tramadol, and Codeine  ? ?Social History  ? ?Tobacco Use  ? Smoking status: Former  ?  Packs/day: 0.50  ?  Years: 15.00  ?  Pack years: 7.50  ?  Types: Cigarettes  ?  Quit date: 05/14/1985  ?  Years since quitting: 36.6  ? Smokeless tobacco: Never  ?Vaping Use  ? Vaping Use: Never used  ?Substance Use Topics  ? Alcohol use: No  ?  Alcohol/week: 0.0 standard drinks  ? Drug use: Never  ?  ? ?Family History: ?The patient's family history includes Breast cancer in her daughter and daughter; Diabetes in her brother and mother; Emphysema in her brother and father; Heart disease in her brother, brother, and mother. ? ?ROS:   ?Please see the history of present illness.    ?(+) Nausea/Vomiting ?(+) Fatigue ?(+) Bloating ?All other systems reviewed and are negative. ? ?EKGs/Labs/Other Studies Reviewed:   ? ?The following studies were reviewed today: ? ?EKG:   ?12/07/2020: Sinus rhythm. Rate 72 bpm. ? ?Recent Labs: ?08/30/2021: NT-Pro BNP 2,175  ?Recent Lipid Panel ?No results found for: CHOL, TRIG, HDL, CHOLHDL, VLDL, LDLCALC, LDLDIRECT ? ?Physical Exam:   ? ?VS:  BP (!) 152/80   Pulse 67   Ht 4\' 11"  (1.499 m)   Wt 139 lb 6.4 oz (63.2 kg)   SpO2 97%   BMI 28.16 kg/m?    ? ?Wt Readings from Last 5 Encounters:  ?12/19/21 139 lb 6.4 oz (63.2 kg)  ?08/30/21 137 lb 12.8 oz (62.5 kg)  ?03/10/21 137 lb 9.6 oz (62.4 kg)  ?12/07/20 147 lb (66.7 kg)  ?09/07/20 152 lb 12.8 oz (69.3 kg)  ?  ?Constitutional: No acute distress ?Eyes: sclera non-icteric, normal conjunctiva and lids ?ENMT: normal dentition, moist mucous membranes ?Cardiovascular: regular rhythm that is intermittently irregular, normal rate, no murmurs. S1 and S2 normal. Radial pulses normal bilaterally. No jugular venous distention.   ?Respiratory: clear to auscultation bilaterally ?GI : normal bowel sounds, soft and nontender.  Nondistended ?MSK: extremities warm, well perfused. No edema.  ?NEURO: grossly nonfocal exam, moves all extremities. ?PSYCH: alert and oriented x 3, normal mood and affect.  ? ?ASSESSMENT:   ? ?1. Persistent atrial fibrillation (HCC)   ?2. Secondary hypercoagulable state (HCC)   ?3. Chest pressure   ?4. Nonrheumatic mitral valve regurgitation   ?5. Primary hypertension   ?6. Weakness   ?7. Mixed hyperlipidemia   ?8. Chronic diastolic (congestive) heart failure (HCC)   ?9. Medication management   ? ? ?PLAN:   ? ?Persistent atrial fibrillation (HCC) status post ablation ?Secondary hypercoagulable state (HCC) ?-  remains in SR on most recent ECG. On eliquis and carvedilol, continue. ?-Amiodarone discontinued due to medication intolerance with nausea and vomiting. ? ?Non-intractable vomiting with nausea, unspecified vomiting type ?-Resolved ? ?Essential hypertension - Plan: EKG 12-Lead ?-Tells me her BP runs low at home, and office readings are often falsely elevated.  Continue current therapy with carvedilol and losartan. ? ?Mixed hyperlipidemia ?-Labs significantly elevated on last check last year, on rosuvastatin and Zetia.  She anticipates a repeat lab check in the next several weeks, I have asked her to forward these results to me for review and medication titration, otherwise this can be pursued by her primary care physician or APP. ? ?Mitral valve regurgitation ?-Appears no more than mild on echocardiogram from March, continue to observe. ? ?Chronic diastolic heart failure ?Chest pressure ?-Due to logistic issues she has been out of her Lasix for 5 weeks, we will refill this urgently today.  Her chest pressure is off and on and she feels it is most consistent with lack of diuresis.  Fortunately today she has NYHA class I-II symptoms and appears relatively euvolemic. ? ? ?Total time of encounter: ?30 minutes total time  of encounter, including 20 minutes spent in face-to-face patient care on the date of this encounter. This time includes coordination of care and counseling regarding above mentioned problem list. Remainder of non-

## 2022-01-25 DIAGNOSIS — E118 Type 2 diabetes mellitus with unspecified complications: Secondary | ICD-10-CM | POA: Diagnosis not present

## 2022-01-25 DIAGNOSIS — I10 Essential (primary) hypertension: Secondary | ICD-10-CM | POA: Diagnosis not present

## 2022-02-04 ENCOUNTER — Other Ambulatory Visit: Payer: Self-pay | Admitting: Internal Medicine

## 2022-02-06 DIAGNOSIS — K2100 Gastro-esophageal reflux disease with esophagitis, without bleeding: Secondary | ICD-10-CM | POA: Diagnosis not present

## 2022-02-06 DIAGNOSIS — B3731 Acute candidiasis of vulva and vagina: Secondary | ICD-10-CM | POA: Diagnosis not present

## 2022-02-06 DIAGNOSIS — E118 Type 2 diabetes mellitus with unspecified complications: Secondary | ICD-10-CM | POA: Diagnosis not present

## 2022-02-06 DIAGNOSIS — E7800 Pure hypercholesterolemia, unspecified: Secondary | ICD-10-CM | POA: Diagnosis not present

## 2022-02-06 DIAGNOSIS — I10 Essential (primary) hypertension: Secondary | ICD-10-CM | POA: Diagnosis not present

## 2022-02-06 DIAGNOSIS — N1832 Chronic kidney disease, stage 3b: Secondary | ICD-10-CM | POA: Diagnosis not present

## 2022-02-06 DIAGNOSIS — I4891 Unspecified atrial fibrillation: Secondary | ICD-10-CM | POA: Diagnosis not present

## 2022-02-09 ENCOUNTER — Other Ambulatory Visit: Payer: Self-pay

## 2022-02-09 DIAGNOSIS — I480 Paroxysmal atrial fibrillation: Secondary | ICD-10-CM

## 2022-02-09 MED ORDER — APIXABAN 5 MG PO TABS: 5.0000 mg | ORAL_TABLET | Freq: Two times a day (BID) | ORAL | 1 refills | Status: DC

## 2022-02-09 NOTE — Telephone Encounter (Signed)
Prescription refill request for Eliquis received. Indication: Afib  Last office visit: 12/19/1921 Jacques Navy)  Scr: 1.22 (12/06/20 via [facility])  Age: 81 Weight: 63.2kg  Appropriate dose and refill sent to requested pharmacy.

## 2022-02-17 DIAGNOSIS — H33001 Unspecified retinal detachment with retinal break, right eye: Secondary | ICD-10-CM | POA: Diagnosis not present

## 2022-02-27 ENCOUNTER — Encounter: Payer: Self-pay | Admitting: Podiatry

## 2022-02-27 ENCOUNTER — Ambulatory Visit (INDEPENDENT_AMBULATORY_CARE_PROVIDER_SITE_OTHER): Payer: Medicare Other | Admitting: Podiatry

## 2022-02-27 DIAGNOSIS — M79674 Pain in right toe(s): Secondary | ICD-10-CM

## 2022-02-27 DIAGNOSIS — M79675 Pain in left toe(s): Secondary | ICD-10-CM | POA: Diagnosis not present

## 2022-02-27 DIAGNOSIS — B351 Tinea unguium: Secondary | ICD-10-CM

## 2022-02-27 NOTE — Progress Notes (Signed)

## 2022-03-07 ENCOUNTER — Encounter (INDEPENDENT_AMBULATORY_CARE_PROVIDER_SITE_OTHER): Payer: Medicare Other | Admitting: Ophthalmology

## 2022-03-07 DIAGNOSIS — H35033 Hypertensive retinopathy, bilateral: Secondary | ICD-10-CM

## 2022-03-07 DIAGNOSIS — E113312 Type 2 diabetes mellitus with moderate nonproliferative diabetic retinopathy with macular edema, left eye: Secondary | ICD-10-CM

## 2022-03-07 DIAGNOSIS — I10 Essential (primary) hypertension: Secondary | ICD-10-CM | POA: Diagnosis not present

## 2022-03-07 DIAGNOSIS — H338 Other retinal detachments: Secondary | ICD-10-CM | POA: Diagnosis not present

## 2022-03-07 DIAGNOSIS — H43813 Vitreous degeneration, bilateral: Secondary | ICD-10-CM | POA: Diagnosis not present

## 2022-04-10 ENCOUNTER — Encounter (INDEPENDENT_AMBULATORY_CARE_PROVIDER_SITE_OTHER): Payer: Medicare Other | Admitting: Ophthalmology

## 2022-04-10 DIAGNOSIS — I10 Essential (primary) hypertension: Secondary | ICD-10-CM | POA: Diagnosis not present

## 2022-04-10 DIAGNOSIS — E113312 Type 2 diabetes mellitus with moderate nonproliferative diabetic retinopathy with macular edema, left eye: Secondary | ICD-10-CM | POA: Diagnosis not present

## 2022-04-10 DIAGNOSIS — H338 Other retinal detachments: Secondary | ICD-10-CM | POA: Diagnosis not present

## 2022-04-10 DIAGNOSIS — H35033 Hypertensive retinopathy, bilateral: Secondary | ICD-10-CM

## 2022-04-10 DIAGNOSIS — H43813 Vitreous degeneration, bilateral: Secondary | ICD-10-CM

## 2022-05-08 ENCOUNTER — Encounter (INDEPENDENT_AMBULATORY_CARE_PROVIDER_SITE_OTHER): Payer: Medicare Other | Admitting: Ophthalmology

## 2022-05-09 DIAGNOSIS — E7800 Pure hypercholesterolemia, unspecified: Secondary | ICD-10-CM | POA: Diagnosis not present

## 2022-05-09 DIAGNOSIS — I10 Essential (primary) hypertension: Secondary | ICD-10-CM | POA: Diagnosis not present

## 2022-05-09 DIAGNOSIS — E118 Type 2 diabetes mellitus with unspecified complications: Secondary | ICD-10-CM | POA: Diagnosis not present

## 2022-05-09 DIAGNOSIS — N1832 Chronic kidney disease, stage 3b: Secondary | ICD-10-CM | POA: Diagnosis not present

## 2022-05-10 ENCOUNTER — Encounter (INDEPENDENT_AMBULATORY_CARE_PROVIDER_SITE_OTHER): Payer: Medicare Other | Admitting: Ophthalmology

## 2022-05-10 DIAGNOSIS — H35033 Hypertensive retinopathy, bilateral: Secondary | ICD-10-CM

## 2022-05-10 DIAGNOSIS — I10 Essential (primary) hypertension: Secondary | ICD-10-CM

## 2022-05-10 DIAGNOSIS — E113312 Type 2 diabetes mellitus with moderate nonproliferative diabetic retinopathy with macular edema, left eye: Secondary | ICD-10-CM

## 2022-05-10 DIAGNOSIS — H43813 Vitreous degeneration, bilateral: Secondary | ICD-10-CM

## 2022-05-16 DIAGNOSIS — N1832 Chronic kidney disease, stage 3b: Secondary | ICD-10-CM | POA: Diagnosis not present

## 2022-05-16 DIAGNOSIS — I10 Essential (primary) hypertension: Secondary | ICD-10-CM | POA: Diagnosis not present

## 2022-05-16 DIAGNOSIS — R2233 Localized swelling, mass and lump, upper limb, bilateral: Secondary | ICD-10-CM | POA: Diagnosis not present

## 2022-05-16 DIAGNOSIS — N3001 Acute cystitis with hematuria: Secondary | ICD-10-CM | POA: Diagnosis not present

## 2022-05-16 DIAGNOSIS — Z23 Encounter for immunization: Secondary | ICD-10-CM | POA: Diagnosis not present

## 2022-05-16 DIAGNOSIS — E7800 Pure hypercholesterolemia, unspecified: Secondary | ICD-10-CM | POA: Diagnosis not present

## 2022-05-16 DIAGNOSIS — E118 Type 2 diabetes mellitus with unspecified complications: Secondary | ICD-10-CM | POA: Diagnosis not present

## 2022-05-16 DIAGNOSIS — K2100 Gastro-esophageal reflux disease with esophagitis, without bleeding: Secondary | ICD-10-CM | POA: Diagnosis not present

## 2022-05-16 DIAGNOSIS — I4891 Unspecified atrial fibrillation: Secondary | ICD-10-CM | POA: Diagnosis not present

## 2022-05-18 DIAGNOSIS — H00012 Hordeolum externum right lower eyelid: Secondary | ICD-10-CM | POA: Diagnosis not present

## 2022-05-24 ENCOUNTER — Encounter (INDEPENDENT_AMBULATORY_CARE_PROVIDER_SITE_OTHER): Payer: Medicare Other | Admitting: Ophthalmology

## 2022-05-24 DIAGNOSIS — H43813 Vitreous degeneration, bilateral: Secondary | ICD-10-CM

## 2022-05-24 DIAGNOSIS — E113312 Type 2 diabetes mellitus with moderate nonproliferative diabetic retinopathy with macular edema, left eye: Secondary | ICD-10-CM | POA: Diagnosis not present

## 2022-05-24 DIAGNOSIS — I10 Essential (primary) hypertension: Secondary | ICD-10-CM

## 2022-05-24 DIAGNOSIS — H35033 Hypertensive retinopathy, bilateral: Secondary | ICD-10-CM

## 2022-05-24 DIAGNOSIS — E113391 Type 2 diabetes mellitus with moderate nonproliferative diabetic retinopathy without macular edema, right eye: Secondary | ICD-10-CM

## 2022-06-21 ENCOUNTER — Encounter (INDEPENDENT_AMBULATORY_CARE_PROVIDER_SITE_OTHER): Payer: Medicare Other | Admitting: Ophthalmology

## 2022-06-23 ENCOUNTER — Encounter (INDEPENDENT_AMBULATORY_CARE_PROVIDER_SITE_OTHER): Payer: Medicare Other | Admitting: Ophthalmology

## 2022-06-23 DIAGNOSIS — I10 Essential (primary) hypertension: Secondary | ICD-10-CM | POA: Diagnosis not present

## 2022-06-23 DIAGNOSIS — E113312 Type 2 diabetes mellitus with moderate nonproliferative diabetic retinopathy with macular edema, left eye: Secondary | ICD-10-CM

## 2022-06-23 DIAGNOSIS — H43813 Vitreous degeneration, bilateral: Secondary | ICD-10-CM

## 2022-06-23 DIAGNOSIS — E113391 Type 2 diabetes mellitus with moderate nonproliferative diabetic retinopathy without macular edema, right eye: Secondary | ICD-10-CM

## 2022-06-23 DIAGNOSIS — H35033 Hypertensive retinopathy, bilateral: Secondary | ICD-10-CM

## 2022-06-28 ENCOUNTER — Ambulatory Visit: Payer: Medicare Other | Admitting: Internal Medicine

## 2022-07-14 ENCOUNTER — Encounter: Payer: Self-pay | Admitting: Podiatry

## 2022-07-14 ENCOUNTER — Ambulatory Visit: Payer: Medicare Other | Admitting: Podiatry

## 2022-07-14 DIAGNOSIS — M79675 Pain in left toe(s): Secondary | ICD-10-CM | POA: Diagnosis not present

## 2022-07-14 DIAGNOSIS — B351 Tinea unguium: Secondary | ICD-10-CM

## 2022-07-14 DIAGNOSIS — M79674 Pain in right toe(s): Secondary | ICD-10-CM | POA: Diagnosis not present

## 2022-07-14 DIAGNOSIS — K2100 Gastro-esophageal reflux disease with esophagitis, without bleeding: Secondary | ICD-10-CM | POA: Insufficient documentation

## 2022-07-17 NOTE — Progress Notes (Signed)
Subjective:   Patient ID: Virginia Gilbert, female   DOB: 81 y.o.   MRN: [id number]   HPI Patient presents stating she cannot cut her toenails and they are getting thickened and hard and they become painful when she wear shoe gear   ROS      Objective:  Physical Exam  Neurovascular status intact thick yellow brittle nailbeds 1-5 both feet painful when pressed     Assessment:  Chronic mycotic nail infection with pain 1-5 both feet     Plan:  Debrided painful nailbeds 1-5 both feet neurogenic bleeding reappoint routine care

## 2022-07-21 ENCOUNTER — Encounter (INDEPENDENT_AMBULATORY_CARE_PROVIDER_SITE_OTHER): Payer: Medicare Other | Admitting: Ophthalmology

## 2022-07-21 DIAGNOSIS — E113291 Type 2 diabetes mellitus with mild nonproliferative diabetic retinopathy without macular edema, right eye: Secondary | ICD-10-CM

## 2022-07-21 DIAGNOSIS — I10 Essential (primary) hypertension: Secondary | ICD-10-CM

## 2022-07-21 DIAGNOSIS — E113312 Type 2 diabetes mellitus with moderate nonproliferative diabetic retinopathy with macular edema, left eye: Secondary | ICD-10-CM

## 2022-07-21 DIAGNOSIS — H338 Other retinal detachments: Secondary | ICD-10-CM | POA: Diagnosis not present

## 2022-07-21 DIAGNOSIS — H43813 Vitreous degeneration, bilateral: Secondary | ICD-10-CM

## 2022-07-21 DIAGNOSIS — H35033 Hypertensive retinopathy, bilateral: Secondary | ICD-10-CM | POA: Diagnosis not present

## 2022-08-05 ENCOUNTER — Other Ambulatory Visit: Payer: Self-pay | Admitting: Internal Medicine

## 2022-08-05 DIAGNOSIS — I480 Paroxysmal atrial fibrillation: Secondary | ICD-10-CM

## 2022-08-15 ENCOUNTER — Telehealth: Payer: Self-pay | Admitting: Internal Medicine

## 2022-08-15 DIAGNOSIS — I480 Paroxysmal atrial fibrillation: Secondary | ICD-10-CM

## 2022-08-15 MED ORDER — APIXABAN 5 MG PO TABS: 5.0000 mg | ORAL_TABLET | Freq: Two times a day (BID) | ORAL | 0 refills | Status: DC

## 2022-08-15 NOTE — Telephone Encounter (Signed)
*  STAT* If patient is at the pharmacy, call can be transferred to refill team.   1. Which medications need to be refilled? (please list name of each medication and dose if known) apixaban (ELIQUIS) 5 MG TABS tablet   2. Which pharmacy/location (including street and city if local pharmacy) is medication to be sent to?  [facility] [address]  3. Do they need a 30 day or 90 day supply? 90  Pt took her last pill today.

## 2022-08-15 NOTE — Addendum Note (Signed)
Addended by: Johny Shock B on: 08/15/2022 02:20 PM   Modules accepted: Orders

## 2022-08-15 NOTE — Telephone Encounter (Addendum)
Prescription refill request for Eliquis received. Indication: afib Last office visit: Virginia Gilbert 12/19/2021 Scr: Pt is overdue for blood work  Age: 81 yo  Weight: 63.2kg

## 2022-08-15 NOTE — Telephone Encounter (Signed)
Called pt and LMOM.  

## 2022-08-15 NOTE — Telephone Encounter (Addendum)
Called and spoke to pt who stated that she is out of Eliquis. Pt is scheduled to see Dr. Margaretann Loveless on 09/06/2022. Pt will get blood work at that time. Orders placed.   Will send in refill so pt does not miss any doses.

## 2022-08-18 ENCOUNTER — Encounter (INDEPENDENT_AMBULATORY_CARE_PROVIDER_SITE_OTHER): Payer: Medicare Other | Admitting: Ophthalmology

## 2022-08-18 DIAGNOSIS — E113291 Type 2 diabetes mellitus with mild nonproliferative diabetic retinopathy without macular edema, right eye: Secondary | ICD-10-CM | POA: Diagnosis not present

## 2022-08-18 DIAGNOSIS — H35033 Hypertensive retinopathy, bilateral: Secondary | ICD-10-CM

## 2022-08-18 DIAGNOSIS — I10 Essential (primary) hypertension: Secondary | ICD-10-CM | POA: Diagnosis not present

## 2022-08-18 DIAGNOSIS — E113312 Type 2 diabetes mellitus with moderate nonproliferative diabetic retinopathy with macular edema, left eye: Secondary | ICD-10-CM

## 2022-08-18 DIAGNOSIS — H43813 Vitreous degeneration, bilateral: Secondary | ICD-10-CM | POA: Diagnosis not present

## 2022-09-06 ENCOUNTER — Encounter: Payer: Self-pay | Admitting: Internal Medicine

## 2022-09-06 ENCOUNTER — Ambulatory Visit: Payer: Medicare Other | Attending: Internal Medicine | Admitting: Internal Medicine

## 2022-09-06 VITALS — BP 134/82 | HR 65 | Ht 59.0 in | Wt 139.4 lb

## 2022-09-06 DIAGNOSIS — D6869 Other thrombophilia: Secondary | ICD-10-CM

## 2022-09-06 DIAGNOSIS — I340 Nonrheumatic mitral (valve) insufficiency: Secondary | ICD-10-CM | POA: Diagnosis not present

## 2022-09-06 DIAGNOSIS — I10 Essential (primary) hypertension: Secondary | ICD-10-CM

## 2022-09-06 DIAGNOSIS — I480 Paroxysmal atrial fibrillation: Secondary | ICD-10-CM

## 2022-09-06 DIAGNOSIS — Z79899 Other long term (current) drug therapy: Secondary | ICD-10-CM

## 2022-09-06 DIAGNOSIS — I5032 Chronic diastolic (congestive) heart failure: Secondary | ICD-10-CM

## 2022-09-06 DIAGNOSIS — E782 Mixed hyperlipidemia: Secondary | ICD-10-CM

## 2022-09-06 DIAGNOSIS — R0789 Other chest pain: Secondary | ICD-10-CM

## 2022-09-06 NOTE — Progress Notes (Signed)
Cardiology Office Note:    Date:  09/06/2022   ID:  Virginia Gilbert, DOB Aug 05, 1941, MRN [id number]  PCP:  Irena Reichmann, DO  Cardiologist:  [name], MD  Electrophysiologist:  Hillis Range, MD   Referring MD: Irena Reichmann, DO   Chief Complaint/Reason for Referral: Afib, HFpEF  History of Present Illness:    Virginia Gilbert is a 82 y.o. female with a history of afib s/p ablation with recurrence, on Eliquis, and HFpEF, as well as DM2.   At her last visit on 12/19/2021, she had a period of approximately 5 weeks where she had not gotten her Lasix refilled due to lack of response from the pharmacy.  After about 3 weeks of not having her Lasix, she began to have chest pressure and weakness with exertion.  Only the day before and the day of her visit did she begin to feel better for unclear reasons.  She had been diagnosed with a renal infection that was asymptomatic and was put on antibiotics which made her feel worse.  She had a history of difficulties with intolerance to medications due to GI upset and feeling unwell.  EKG not obtained on her last visit, however, a prior EKG from 08/2021 documents sinus rhythm.  She felt she was not in atrial fibrillation at her last visit and was overall doing well.  Amiodarone had to be discontinued due to GI upset however she was doing well without this medication at the time.  Today, she presents feeling overall well. She reports having retinal bleeding, which she is currently having treatments every 4 months.   She has concerns about bruising on her arms attributed to her Eliquis 5 mg twice daily. She also has been having trouble refilling her Eliquis. She has not missed a dose.   She has been staying active and notes that she has been feeling a lot stronger in comparison to before.   Her episodes of nausea and vomiting have improved.  She is compliant with her Amlodipine 5 mg daily, Eliquis 5 mg twice daily, Carvedilol 6.25 mg twice daily,  Zetia 10 mg daily, Lasix 20 mg twice, Losartan 100 mg daily, Pravastatin 10 mg daily. She notes that Lasix has been helping her and reports her dry weight has been around 138 lbs.   She has been monitoring her cholesterol with her PCP. We discussed her blood pressure of 134/42. No significant abnormalities.   She denies any palpitations, chest pain, shortness of breath, or peripheral edema. No lightheadedness, headaches, syncope, orthopnea, or PND. She denies any falls or head injuries.  Past Medical History:  Diagnosis Date   Diabetes mellitus without complication (HCC)    Hyperlipidemia    Hypertension    Mitral regurgitation    evaluated by TEE 09/2019   Persistent atrial fibrillation (HCC)     Past Surgical History:  Procedure Laterality Date   ATRIAL FIBRILLATION ABLATION N/A 12/02/2019   Procedure: ATRIAL FIBRILLATION ABLATION;  Surgeon: Hillis Range, MD;  Location: [facility] INVASIVE CV LAB;  Service: Cardiovascular;  Laterality: N/A;   BUBBLE STUDY  10/08/2019   Procedure: BUBBLE STUDY;  Surgeon: [name], MD;  Location: [facility] ENDOSCOPY;  Service: Cardiology;;   CARDIOVERSION N/A 07/08/2019   Procedure: CARDIOVERSION;  Surgeon: Yates Decamp, MD;  Location: [facility] ENDOSCOPY;  Service: Cardiovascular;  Laterality: N/A;   CARDIOVERSION N/A 09/02/2019   Procedure: CARDIOVERSION;  Surgeon: Yates Decamp, MD;  Location: [facility] ENDOSCOPY;  Service: Cardiovascular;  Laterality: N/A;   CARDIOVERSION  N/A 01/13/2020   Procedure: CARDIOVERSION;  Surgeon: Geralynn Rile, MD;  Location: [place];  Service: Cardiovascular;  Laterality: N/A;   CATARACT EXTRACTION, BILATERAL     PARTIAL HYSTERECTOMY     1977   TEE WITHOUT CARDIOVERSION N/A 10/08/2019   Procedure: TRANSESOPHAGEAL ECHOCARDIOGRAM (TEE);  Surgeon: Elouise Munroe, MD;  Location: [place];  Service: Cardiology;  Laterality: N/A;    Current Medications: Current Meds  Medication Sig   acetaminophen (TYLENOL) 500 MG tablet Take  500 mg by mouth every 8 (eight) hours as needed for moderate pain or headache.    amLODipine (NORVASC) 5 MG tablet 1 tablet Orally Once a day   apixaban (ELIQUIS) 5 MG TABS tablet Take 1 tablet (5 mg total) by mouth 2 (two) times daily.   carvedilol (COREG) 6.25 MG tablet Take 6.25 mg by mouth 2 (two) times daily.   Cholecalciferol (VITAMIN D-3) 125 MCG (5000 UT) TABS Take 5,000 Units by mouth daily with lunch.    Cyanocobalamin (B-12) 2500 MCG TABS Take 2,500 mcg by mouth daily with lunch.    cyclobenzaprine (FLEXERIL) 5 MG tablet Take 5-10 mg by mouth at bedtime as needed. As directed   ezetimibe (ZETIA) 10 MG tablet Take 10 mg by mouth daily.   ferrous gluconate (FERGON) 240 (27 FE) MG tablet 1 tablet with water or juice between meals   furosemide (LASIX) 20 MG tablet Take 1-2 tablets (20-40 mg total) by mouth 2 (two) times daily. Take 2 tablets (40 mg total) in the mornings and 1 tablet (20 mg total) in the evenings.   glimepiride (AMARYL) 4 MG tablet Take 1 tablet (4 mg total) by mouth daily with breakfast.   JARDIANCE 10 MG TABS tablet Take 10 mg by mouth daily.   LORazepam (ATIVAN) 0.5 MG tablet Take 0.5 mg by mouth 4 (four) times daily as needed.   losartan (COZAAR) 100 MG tablet Take 100 mg by mouth daily.    metFORMIN (GLUCOPHAGE) 1000 MG tablet Take 1,000 mg by mouth 2 (two) times daily with a meal.    rosuvastatin (CRESTOR) 20 MG tablet Take 20 mg by mouth daily.     Allergies:   Penicillins, Tramadol, and Codeine   Social History   Tobacco Use   Smoking status: Former    Packs/day: 0.50    Years: 15.00    Total pack years: 7.50    Types: Cigarettes    Quit date: 05/14/1985    Years since quitting: 37.3   Smokeless tobacco: Never  Vaping Use   Vaping Use: Never used  Substance Use Topics   Alcohol use: No    Alcohol/week: 0.0 standard drinks of alcohol   Drug use: Never     Family History: The patient's family history includes Breast cancer in her daughter and  daughter; Diabetes in her brother and mother; Emphysema in her brother and father; Heart disease in her brother, brother, and mother.  ROS:   Please see the history of present illness.    (+) Bruises on arms - attributed to eliquis All other systems reviewed and are negative.  EKGs/Labs/Other Studies Reviewed:    The following studies were reviewed today:  Echo 10/21/2021: IMPRESSIONS   1. Left ventricular ejection fraction by 3D volume is 56 %. The left  ventricle has normal function. The left ventricle has no regional wall  motion abnormalities. Left ventricular diastolic parameters are  indeterminate.   2. Right ventricular systolic function is normal. The right ventricular  size  is normal. There is normal pulmonary artery systolic pressure.   3. The mitral valve is normal in structure. Trivial mitral valve  regurgitation. No evidence of mitral stenosis. Moderate mitral annular  calcification.   4. The aortic valve is normal in structure. Aortic valve regurgitation is  not visualized. Aortic valve sclerosis/calcification is present, without  any evidence of aortic stenosis.   5. The inferior vena cava is normal in size with greater than 50%  respiratory variability, suggesting right atrial pressure of 3 mmHg.    EKG:  EKG is personally reviewed.  09/06/2022: Sinus rhythm. Rate 65 bpm.  12/07/2020: Sinus rhythm. Rate 72 bpm.  Recent Labs: No results found for requested labs within last 365 days.  Recent Lipid Panel No results found for: "CHOL", "TRIG", "HDL", "CHOLHDL", "VLDL", "LDLCALC", "LDLDIRECT"  Physical Exam:    VS:  BP 134/82   Pulse 65   Ht 4\' 11"  (1.499 m)   Wt 139 lb 6.4 oz (63.2 kg)   SpO2 94%   BMI 28.16 kg/m     Wt Readings from Last 5 Encounters:  09/06/22 139 lb 6.4 oz (63.2 kg)  12/19/21 139 lb 6.4 oz (63.2 kg)  08/30/21 137 lb 12.8 oz (62.5 kg)  03/10/21 137 lb 9.6 oz (62.4 kg)  12/07/20 147 lb (66.7 kg)    Constitutional: No acute  distress Eyes: sclera non-icteric, normal conjunctiva and lids ENMT: normal dentition, moist mucous membranes Cardiovascular: regular rhythm, normal rate, no murmurs. S1 and S2 normal. No jugular venous distention.  Respiratory: clear to auscultation bilaterally GI : normal bowel sounds, soft and nontender.  Nondistended MSK: extremities warm, well perfused. No edema.  NEURO: grossly nonfocal exam, moves all extremities. PSYCH: alert and oriented x 3, normal mood and affect.   ASSESSMENT:    1. AF (paroxysmal atrial fibrillation) (HCC)   2. Paroxysmal atrial fibrillation (HCC)   3. Secondary hypercoagulable state (HCC)   4. Primary hypertension   5. Mixed hyperlipidemia   6. Nonrheumatic mitral valve regurgitation   7. Chronic diastolic (congestive) heart failure (HCC)   8. Chest pressure   9. Medication management      PLAN:    Persistent atrial fibrillation (HCC) status post ablation Secondary hypercoagulable state (HCC) Medication management. - remains in SR on most recent ECG. On eliquis and carvedilol, continue. Refill eliquis after lab work. Discourage discontinuation of eliquis given recurrence after ablation when in HF. -Amiodarone discontinued due to medication intolerance with nausea and vomiting. Maintaining SR.   Essential hypertension - Plan: EKG 12-Lead -Stable, continue current therapy with carvedilol and losartan.  Mixed hyperlipidemia -Labs significantly elevated on last check last year, on statin and Zetia. Continue to follow with PCP as I do not have most recent labwork.   Mitral valve regurgitation -Appears no more than mild on echocardiogram from March, continue to observe.  Chronic diastolic heart failure Chest pressure - feeling well without CP or HF sx.  - continue lasix 20 mg BID - will check BMET for medication management.   Total time of encounter: 30 minutes total time of encounter, including 20 minutes spent in face-to-face patient care on  the date of this encounter. This time includes coordination of care and counseling regarding above mentioned problem list. Remainder of non-face-to-face time involved reviewing chart documents/testing relevant to the patient encounter and documentation in the medical record. I have independently reviewed documentation from referring provider.   April, MD, [facility]  [facility]    Medication  Adjustments/Labs and Tests Ordered: Current medicines are reviewed at length with the patient today.  Concerns regarding medicines are outlined above.   Orders Placed This Encounter  Procedures   EKG 12-Lead   No orders of the defined types were placed in this encounter.  Patient Instructions  Medication Instructions:  No changes *If you need a refill on your cardiac medications before your next appointment, please call your pharmacy*   Lab Work: CBC, CMET today If you have labs (blood work) drawn today and your tests are completely normal, you will receive your results only by: [place] (if you have MyChart) OR A paper copy in the mail If you have any lab test that is abnormal or we need to change your treatment, we will call you to review the results.  Follow-Up: At [facility], you and your health needs are our priority.  As part of our continuing mission to provide you with exceptional heart care, we have created designated Provider Care Teams.  These Care Teams include your primary Cardiologist (physician) and Advanced Practice Providers (APPs -  Physician Assistants and Nurse Practitioners) who all work together to provide you with the care you need, when you need it.  We recommend signing up for the patient portal called "MyChart".  Sign up information is provided on this After Visit Summary.  MyChart is used to connect with patients for Virtual Visits (Telemedicine).  Patients are able to view lab/test results, encounter notes, upcoming appointments,  etc.  Non-urgent messages can be sent to your provider as well.   To learn more about what you can do with MyChart, go to [email or web address].    Your next appointment:   6 month(s)  Provider:   Elouise Munroe, MD       I,Rachel Rivera,acting as a scribe for Elouise Munroe, MD.,have documented all relevant documentation on the behalf of Elouise Munroe, MD,as directed by  Elouise Munroe, MD while in the presence of Elouise Munroe, MD.  I, Elouise Munroe, MD, have reviewed all documentation for the visit on 09/06/2022. The documentation on today's date of service for the exam, diagnosis, procedures, and orders are all accurate and complete.

## 2022-09-06 NOTE — Patient Instructions (Signed)
Medication Instructions:  No changes *If you need a refill on your cardiac medications before your next appointment, please call your pharmacy*   Lab Work: CBC, CMET today If you have labs (blood work) drawn today and your tests are completely normal, you will receive your results only by: [place] (if you have MyChart) OR A paper copy in the mail If you have any lab test that is abnormal or we need to change your treatment, we will call you to review the results.  Follow-Up: At [facility], you and your health needs are our priority.  As part of our continuing mission to provide you with exceptional heart care, we have created designated Provider Care Teams.  These Care Teams include your primary Cardiologist (physician) and Advanced Practice Providers (APPs -  Physician Assistants and Nurse Practitioners) who all work together to provide you with the care you need, when you need it.  We recommend signing up for the patient portal called "MyChart".  Sign up information is provided on this After Visit Summary.  MyChart is used to connect with patients for Virtual Visits (Telemedicine).  Patients are able to view lab/test results, encounter notes, upcoming appointments, etc.  Non-urgent messages can be sent to your provider as well.   To learn more about what you can do with MyChart, go to [email or web address].    Your next appointment:   6 month(s)  Provider:   Elouise Munroe, MD

## 2022-09-07 LAB — BASIC METABOLIC PANEL
BUN/Creatinine Ratio: 19 (ref 12–28)
BUN: 36 mg/dL — ABNORMAL HIGH (ref 8–27)
CO2: 23 mmol/L (ref 20–29)
Calcium: 8.8 mg/dL (ref 8.7–10.3)
Chloride: 103 mmol/L (ref 96–106)
Creatinine, Ser: 1.87 mg/dL — ABNORMAL HIGH (ref 0.57–1.00)
Glucose: 231 mg/dL — ABNORMAL HIGH (ref 70–99)
Potassium: 4.2 mmol/L (ref 3.5–5.2)
Sodium: 144 mmol/L (ref 134–144)
eGFR: 27 mL/min/{1.73_m2} — ABNORMAL LOW (ref >59)

## 2022-09-07 LAB — CBC
Hematocrit: 37.4 % (ref 34.0–46.6)
Hemoglobin: 11.7 g/dL (ref 11.1–15.9)
MCH: 28.5 pg (ref 26.6–33.0)
MCHC: 31.3 g/dL — ABNORMAL LOW (ref 31.5–35.7)
MCV: 91 fL (ref 79–97)
Platelets: 307 10*3/uL (ref 150–450)
RBC: 4.1 x10E6/uL (ref 3.77–5.28)
RDW: 13.2 % (ref 11.7–15.4)
WBC: 7.9 10*3/uL (ref 3.4–10.8)

## 2022-09-08 ENCOUNTER — Telehealth: Payer: Self-pay | Admitting: Internal Medicine

## 2022-09-08 DIAGNOSIS — Z5181 Encounter for therapeutic drug level monitoring: Secondary | ICD-10-CM

## 2022-09-08 NOTE — Telephone Encounter (Signed)
Patient was returning call. Please advise ?

## 2022-09-08 NOTE — Telephone Encounter (Signed)
  Chriss Driver, RN 09/07/2022  3:36 PM EST Back to Top    Attempted to call patient, left message for patient to call back to office.   Elouise Munroe, MD 09/07/2022 12:37 PM EST     Cr abnormal currently, though she feels well, have her hold lasix for 2-3 days, and come back for repeat labs on 1/29. If cr doesn't improve, we may want to consider dose adjusted eliquis given that she is approaching both weight and age cutoff.   Patient called w/results. She will hold lasix tomorrow, Sunday, Monday. Repeat BMET on Tuesday

## 2022-09-13 DIAGNOSIS — Z5181 Encounter for therapeutic drug level monitoring: Secondary | ICD-10-CM | POA: Diagnosis not present

## 2022-09-14 LAB — BASIC METABOLIC PANEL
BUN/Creatinine Ratio: 19 (ref 12–28)
BUN: 29 mg/dL — ABNORMAL HIGH (ref 8–27)
CO2: 19 mmol/L — ABNORMAL LOW (ref 20–29)
Calcium: 9.4 mg/dL (ref 8.7–10.3)
Chloride: 102 mmol/L (ref 96–106)
Creatinine, Ser: 1.55 mg/dL — ABNORMAL HIGH (ref 0.57–1.00)
Glucose: 130 mg/dL — ABNORMAL HIGH (ref 70–99)
Potassium: 4.6 mmol/L (ref 3.5–5.2)
Sodium: 138 mmol/L (ref 134–144)
eGFR: 34 mL/min/{1.73_m2} — ABNORMAL LOW (ref >59)

## 2022-09-15 ENCOUNTER — Telehealth: Payer: Self-pay | Admitting: Internal Medicine

## 2022-09-15 ENCOUNTER — Encounter (INDEPENDENT_AMBULATORY_CARE_PROVIDER_SITE_OTHER): Payer: Medicare Other | Admitting: Ophthalmology

## 2022-09-15 DIAGNOSIS — H43813 Vitreous degeneration, bilateral: Secondary | ICD-10-CM | POA: Diagnosis not present

## 2022-09-15 DIAGNOSIS — H35033 Hypertensive retinopathy, bilateral: Secondary | ICD-10-CM | POA: Diagnosis not present

## 2022-09-15 DIAGNOSIS — H338 Other retinal detachments: Secondary | ICD-10-CM | POA: Diagnosis not present

## 2022-09-15 DIAGNOSIS — E113291 Type 2 diabetes mellitus with mild nonproliferative diabetic retinopathy without macular edema, right eye: Secondary | ICD-10-CM

## 2022-09-15 DIAGNOSIS — I10 Essential (primary) hypertension: Secondary | ICD-10-CM | POA: Diagnosis not present

## 2022-09-15 DIAGNOSIS — E113312 Type 2 diabetes mellitus with moderate nonproliferative diabetic retinopathy with macular edema, left eye: Secondary | ICD-10-CM | POA: Diagnosis not present

## 2022-09-15 NOTE — Telephone Encounter (Signed)
Pt returning nurses call regarding results. Please advise 

## 2022-09-15 NOTE — Telephone Encounter (Signed)
Spoke with patient, relayed Dr Delphina Cahill message- that her Cr has improved while holding Lasix; Start taking Lasix 20mg  daily and stay well hydrated. Patient verbalized understanding of instructions. Told to call with any questions or concerns.

## 2022-09-18 ENCOUNTER — Telehealth: Payer: Self-pay | Admitting: Internal Medicine

## 2022-09-18 NOTE — Telephone Encounter (Signed)
Follow Up:      Patient is returning Jenna's call from today, concerning her results.

## 2022-09-18 NOTE — Telephone Encounter (Signed)
Virginia Munroe, MD  Pavero, Christopher, Chase; Sofija Antwi, Belinda Block, RN Ok to refill Eliquis 5 mg BID. Please see how she is doing since she had to hold her lasix for several days for renal dysfunction (hx of HFpEF). May want to have her come in for a visit in 1 mo (me or APP) to ensure no volume accumulation.        Attempted to return call to patient- unable to reach or leave message at this time. Will try again at another time.

## 2022-10-03 NOTE — Telephone Encounter (Signed)
Attempted to call patient, left message for patient to call back to office.   

## 2022-10-13 ENCOUNTER — Encounter (INDEPENDENT_AMBULATORY_CARE_PROVIDER_SITE_OTHER): Payer: Medicare Other | Admitting: Ophthalmology

## 2022-10-13 DIAGNOSIS — E113312 Type 2 diabetes mellitus with moderate nonproliferative diabetic retinopathy with macular edema, left eye: Secondary | ICD-10-CM | POA: Diagnosis not present

## 2022-10-13 DIAGNOSIS — I10 Essential (primary) hypertension: Secondary | ICD-10-CM

## 2022-10-13 DIAGNOSIS — H35033 Hypertensive retinopathy, bilateral: Secondary | ICD-10-CM | POA: Diagnosis not present

## 2022-10-13 DIAGNOSIS — H43813 Vitreous degeneration, bilateral: Secondary | ICD-10-CM | POA: Diagnosis not present

## 2022-10-13 DIAGNOSIS — E113291 Type 2 diabetes mellitus with mild nonproliferative diabetic retinopathy without macular edema, right eye: Secondary | ICD-10-CM

## 2022-10-13 DIAGNOSIS — H338 Other retinal detachments: Secondary | ICD-10-CM | POA: Diagnosis not present

## 2022-10-16 ENCOUNTER — Ambulatory Visit: Payer: Medicare Other | Admitting: Podiatry

## 2022-10-16 ENCOUNTER — Encounter: Payer: Self-pay | Admitting: Podiatry

## 2022-10-16 DIAGNOSIS — M79674 Pain in right toe(s): Secondary | ICD-10-CM | POA: Diagnosis not present

## 2022-10-16 DIAGNOSIS — B351 Tinea unguium: Secondary | ICD-10-CM

## 2022-10-16 DIAGNOSIS — M79675 Pain in left toe(s): Secondary | ICD-10-CM | POA: Diagnosis not present

## 2022-10-18 NOTE — Progress Notes (Signed)
Subjective:   Patient ID: Virginia Gilbert, female   DOB: 82 y.o.   MRN: [id number]   HPI Patient presents with elongated nails 1-5 both feet she cannot cut and they get irritated and painful at times with shoe gear   ROS      Objective:  Physical Exam  Neurovascular unchanged thick yellow brittle nailbeds 1-5 both feet that are painful and elongated     Assessment:  Chronic mycotic nail infection 1-5 both feet     Plan:  Debridement nailbeds 1-5 both feet neurogenic bleeding reappoint for routine care

## 2022-10-26 NOTE — Telephone Encounter (Signed)
  Chriss Driver, RN 10/26/2022 12:00 PM EDT Back to Top    Returned call to patient to follow up and see how she's doing. Patient states that she is doing well, and reports that she has had a couple of time (2 times max) in the last month that her weight was up and she was swelling and sob and took an extra 20mg  dose of lasix for 2 days. Patient reports she still takes lasix 20mg  daily otherwise. Patient at current has no issues and weight is stable. Advised patient to continue monitoring, and scheduled patient a follow up for April 25th, with Dr. Margaretann Loveless.   Confirmed patient is still taking Elliquis 5mg  BID.   Advised patient to call back to office with any issues, questions, or concerns. Patient verbalized understanding.   Will forward to MD to make aware!

## 2022-11-06 ENCOUNTER — Other Ambulatory Visit: Payer: Self-pay

## 2022-11-06 DIAGNOSIS — I480 Paroxysmal atrial fibrillation: Secondary | ICD-10-CM

## 2022-11-06 MED ORDER — APIXABAN 5 MG PO TABS: 5.0000 mg | ORAL_TABLET | Freq: Two times a day (BID) | ORAL | 1 refills | Status: DC

## 2022-11-06 NOTE — Telephone Encounter (Signed)
Prescription refill request for Eliquis received. Indication: Afib  Last office visit: 09/06/22 Margaretann Loveless)  Scr: 1.55 (09/13/22)  Age: 82 Weight: 63.2kg  Appropriate dose. Refill sent.

## 2022-11-10 ENCOUNTER — Encounter (INDEPENDENT_AMBULATORY_CARE_PROVIDER_SITE_OTHER): Payer: Medicare Other | Admitting: Ophthalmology

## 2022-11-13 ENCOUNTER — Encounter (INDEPENDENT_AMBULATORY_CARE_PROVIDER_SITE_OTHER): Payer: Medicare Other | Admitting: Ophthalmology

## 2022-11-13 DIAGNOSIS — E113312 Type 2 diabetes mellitus with moderate nonproliferative diabetic retinopathy with macular edema, left eye: Secondary | ICD-10-CM

## 2022-11-13 DIAGNOSIS — I10 Essential (primary) hypertension: Secondary | ICD-10-CM

## 2022-11-13 DIAGNOSIS — E113391 Type 2 diabetes mellitus with moderate nonproliferative diabetic retinopathy without macular edema, right eye: Secondary | ICD-10-CM

## 2022-11-13 DIAGNOSIS — H43813 Vitreous degeneration, bilateral: Secondary | ICD-10-CM | POA: Diagnosis not present

## 2022-11-13 DIAGNOSIS — H338 Other retinal detachments: Secondary | ICD-10-CM | POA: Diagnosis not present

## 2022-11-13 DIAGNOSIS — H35033 Hypertensive retinopathy, bilateral: Secondary | ICD-10-CM | POA: Diagnosis not present

## 2022-11-14 DIAGNOSIS — I10 Essential (primary) hypertension: Secondary | ICD-10-CM | POA: Diagnosis not present

## 2022-11-14 DIAGNOSIS — E118 Type 2 diabetes mellitus with unspecified complications: Secondary | ICD-10-CM | POA: Diagnosis not present

## 2022-11-14 DIAGNOSIS — R6889 Other general symptoms and signs: Secondary | ICD-10-CM | POA: Diagnosis not present

## 2022-11-14 DIAGNOSIS — R5383 Other fatigue: Secondary | ICD-10-CM | POA: Diagnosis not present

## 2022-11-14 DIAGNOSIS — N1832 Chronic kidney disease, stage 3b: Secondary | ICD-10-CM | POA: Diagnosis not present

## 2022-11-14 DIAGNOSIS — E7800 Pure hypercholesterolemia, unspecified: Secondary | ICD-10-CM | POA: Diagnosis not present

## 2022-11-14 DIAGNOSIS — I4891 Unspecified atrial fibrillation: Secondary | ICD-10-CM | POA: Diagnosis not present

## 2022-11-21 DIAGNOSIS — N1832 Chronic kidney disease, stage 3b: Secondary | ICD-10-CM | POA: Diagnosis not present

## 2022-11-21 DIAGNOSIS — Z0000 Encounter for general adult medical examination without abnormal findings: Secondary | ICD-10-CM | POA: Diagnosis not present

## 2022-11-21 DIAGNOSIS — N390 Urinary tract infection, site not specified: Secondary | ICD-10-CM | POA: Diagnosis not present

## 2022-11-21 DIAGNOSIS — E118 Type 2 diabetes mellitus with unspecified complications: Secondary | ICD-10-CM | POA: Diagnosis not present

## 2022-11-21 DIAGNOSIS — I10 Essential (primary) hypertension: Secondary | ICD-10-CM | POA: Diagnosis not present

## 2022-11-21 DIAGNOSIS — E7800 Pure hypercholesterolemia, unspecified: Secondary | ICD-10-CM | POA: Diagnosis not present

## 2022-11-21 DIAGNOSIS — K2100 Gastro-esophageal reflux disease with esophagitis, without bleeding: Secondary | ICD-10-CM | POA: Diagnosis not present

## 2022-11-21 DIAGNOSIS — I4891 Unspecified atrial fibrillation: Secondary | ICD-10-CM | POA: Diagnosis not present

## 2022-12-05 DIAGNOSIS — M7581 Other shoulder lesions, right shoulder: Secondary | ICD-10-CM | POA: Diagnosis not present

## 2022-12-07 ENCOUNTER — Ambulatory Visit: Payer: Medicare Other | Admitting: Internal Medicine

## 2022-12-07 DIAGNOSIS — E119 Type 2 diabetes mellitus without complications: Secondary | ICD-10-CM | POA: Diagnosis not present

## 2022-12-07 DIAGNOSIS — R5383 Other fatigue: Secondary | ICD-10-CM | POA: Diagnosis not present

## 2022-12-07 DIAGNOSIS — J3489 Other specified disorders of nose and nasal sinuses: Secondary | ICD-10-CM | POA: Diagnosis not present

## 2022-12-07 DIAGNOSIS — R519 Headache, unspecified: Secondary | ICD-10-CM | POA: Diagnosis not present

## 2022-12-11 ENCOUNTER — Encounter (INDEPENDENT_AMBULATORY_CARE_PROVIDER_SITE_OTHER): Payer: Medicare Other | Admitting: Ophthalmology

## 2022-12-13 DIAGNOSIS — R051 Acute cough: Secondary | ICD-10-CM | POA: Diagnosis not present

## 2022-12-13 DIAGNOSIS — R0981 Nasal congestion: Secondary | ICD-10-CM | POA: Diagnosis not present

## 2022-12-13 DIAGNOSIS — R509 Fever, unspecified: Secondary | ICD-10-CM | POA: Diagnosis not present

## 2022-12-18 ENCOUNTER — Encounter (INDEPENDENT_AMBULATORY_CARE_PROVIDER_SITE_OTHER): Payer: Medicare Other | Admitting: Ophthalmology

## 2022-12-21 DIAGNOSIS — R059 Cough, unspecified: Secondary | ICD-10-CM | POA: Diagnosis not present

## 2022-12-21 DIAGNOSIS — R739 Hyperglycemia, unspecified: Secondary | ICD-10-CM | POA: Diagnosis not present

## 2022-12-21 DIAGNOSIS — R5383 Other fatigue: Secondary | ICD-10-CM | POA: Diagnosis not present

## 2022-12-25 ENCOUNTER — Encounter (INDEPENDENT_AMBULATORY_CARE_PROVIDER_SITE_OTHER): Payer: Self-pay

## 2022-12-25 ENCOUNTER — Encounter (INDEPENDENT_AMBULATORY_CARE_PROVIDER_SITE_OTHER): Payer: Medicare HMO | Admitting: Ophthalmology

## 2022-12-28 DIAGNOSIS — H52223 Regular astigmatism, bilateral: Secondary | ICD-10-CM | POA: Diagnosis not present

## 2022-12-29 ENCOUNTER — Encounter (INDEPENDENT_AMBULATORY_CARE_PROVIDER_SITE_OTHER): Payer: Medicare HMO | Admitting: Ophthalmology

## 2022-12-29 DIAGNOSIS — H338 Other retinal detachments: Secondary | ICD-10-CM

## 2022-12-29 DIAGNOSIS — Z7984 Long term (current) use of oral hypoglycemic drugs: Secondary | ICD-10-CM | POA: Diagnosis not present

## 2022-12-29 DIAGNOSIS — H35033 Hypertensive retinopathy, bilateral: Secondary | ICD-10-CM

## 2022-12-29 DIAGNOSIS — I10 Essential (primary) hypertension: Secondary | ICD-10-CM

## 2022-12-29 DIAGNOSIS — E113391 Type 2 diabetes mellitus with moderate nonproliferative diabetic retinopathy without macular edema, right eye: Secondary | ICD-10-CM

## 2022-12-29 DIAGNOSIS — H4421 Degenerative myopia, right eye: Secondary | ICD-10-CM | POA: Diagnosis not present

## 2022-12-29 DIAGNOSIS — E113312 Type 2 diabetes mellitus with moderate nonproliferative diabetic retinopathy with macular edema, left eye: Secondary | ICD-10-CM | POA: Diagnosis not present

## 2022-12-29 DIAGNOSIS — H43813 Vitreous degeneration, bilateral: Secondary | ICD-10-CM | POA: Diagnosis not present

## 2023-01-15 ENCOUNTER — Encounter: Payer: Self-pay | Admitting: Podiatry

## 2023-01-15 ENCOUNTER — Ambulatory Visit: Payer: Medicare HMO | Admitting: Podiatry

## 2023-01-15 DIAGNOSIS — B351 Tinea unguium: Secondary | ICD-10-CM

## 2023-01-15 DIAGNOSIS — M79675 Pain in left toe(s): Secondary | ICD-10-CM | POA: Diagnosis not present

## 2023-01-15 DIAGNOSIS — M79674 Pain in right toe(s): Secondary | ICD-10-CM | POA: Diagnosis not present

## 2023-01-15 NOTE — Progress Notes (Signed)
Subjective:   Patient ID: Virginia Gilbert, female   DOB: 82 y.o.   MRN: [id number]   HPI Patient presents with elongated nailbeds 1-5 both feet that are thick yellow brittle and can become painful   ROS      Objective:  Physical Exam  Thick yellow brittle nailbeds 1-5 both feet that she cannot take care of with pain     Assessment:  Mycotic nail infection 1-5 both feet with pain     Plan:  Debrided nailbeds 1-5 both feet neurogenic bleeding reappoint routine care

## 2023-01-19 ENCOUNTER — Encounter: Payer: Self-pay | Admitting: Orthopaedic Surgery

## 2023-01-19 ENCOUNTER — Other Ambulatory Visit: Payer: Self-pay | Admitting: Orthopaedic Surgery

## 2023-01-19 DIAGNOSIS — M19019 Primary osteoarthritis, unspecified shoulder: Secondary | ICD-10-CM

## 2023-01-19 DIAGNOSIS — M7581 Other shoulder lesions, right shoulder: Secondary | ICD-10-CM | POA: Diagnosis not present

## 2023-01-23 ENCOUNTER — Ambulatory Visit
Admission: RE | Admit: 2023-01-23 | Discharge: 2023-01-23 | Disposition: A | Discharge status: Home / Self Care | Payer: Medicare HMO | Source: Ambulatory Visit | Attending: Orthopaedic Surgery | Admitting: Orthopaedic Surgery

## 2023-01-23 DIAGNOSIS — M19019 Primary osteoarthritis, unspecified shoulder: Secondary | ICD-10-CM

## 2023-01-23 DIAGNOSIS — M19011 Primary osteoarthritis, right shoulder: Secondary | ICD-10-CM | POA: Diagnosis not present

## 2023-01-23 DIAGNOSIS — M25511 Pain in right shoulder: Secondary | ICD-10-CM | POA: Diagnosis not present

## 2023-01-25 DIAGNOSIS — M7581 Other shoulder lesions, right shoulder: Secondary | ICD-10-CM | POA: Diagnosis not present

## 2023-01-29 ENCOUNTER — Encounter (INDEPENDENT_AMBULATORY_CARE_PROVIDER_SITE_OTHER): Payer: Medicare HMO | Admitting: Ophthalmology

## 2023-01-29 DIAGNOSIS — H338 Other retinal detachments: Secondary | ICD-10-CM | POA: Diagnosis not present

## 2023-01-29 DIAGNOSIS — E113312 Type 2 diabetes mellitus with moderate nonproliferative diabetic retinopathy with macular edema, left eye: Secondary | ICD-10-CM | POA: Diagnosis not present

## 2023-01-29 DIAGNOSIS — I10 Essential (primary) hypertension: Secondary | ICD-10-CM | POA: Diagnosis not present

## 2023-01-29 DIAGNOSIS — E113391 Type 2 diabetes mellitus with moderate nonproliferative diabetic retinopathy without macular edema, right eye: Secondary | ICD-10-CM | POA: Diagnosis not present

## 2023-01-29 DIAGNOSIS — H35033 Hypertensive retinopathy, bilateral: Secondary | ICD-10-CM | POA: Diagnosis not present

## 2023-01-29 DIAGNOSIS — H43813 Vitreous degeneration, bilateral: Secondary | ICD-10-CM | POA: Diagnosis not present

## 2023-01-29 DIAGNOSIS — Z7984 Long term (current) use of oral hypoglycemic drugs: Secondary | ICD-10-CM

## 2023-01-29 DIAGNOSIS — H4423 Degenerative myopia, bilateral: Secondary | ICD-10-CM | POA: Diagnosis not present

## 2023-02-07 ENCOUNTER — Telehealth: Payer: Self-pay | Admitting: *Deleted

## 2023-02-07 NOTE — Telephone Encounter (Signed)
   Name: Virginia Gilbert  DOB: Jan 28, 1941  MRN: [id number]  Primary Cardiologist: [name], MD  Chart reviewed as part of pre-operative protocol coverage. The patient has an upcoming visit scheduled with Dr. Jacques Navy on 02/13/23 at which time clearance can be addressed in case there are any issues that would impact surgical recommendations.  I added preop FYI to appointment note so that provider is aware to address at time of outpatient visit.  Per office protocol the cardiology provider should forward their finalized clearance decision and recommendations regarding antiplatelet therapy to the requesting party below.    This message will also be routed to pharmacy pool  for input on holding Eliquis as requested below so that this information is available to the clearing provider at time of patient's appointment.   I will route this message as FYI to requesting party and remove this message from the preop box as separate preop APP input not needed at this time.   Please call with any questions.  Napoleon Form, Leodis Rains, NP  02/07/2023, 2:52 PM

## 2023-02-07 NOTE — Telephone Encounter (Addendum)
Patient with diagnosis of afib on Eliquis for anticoagulation.    Procedure:  RIGHT REVERSE TOTAL SHOULDER REPLACEMENT  Date of procedure: TBD   CHA2DS2-VASc Score = 6   This indicates a 9.7% annual risk of stroke. The patient's score is based upon: CHF History: 1 HTN History: 1 Diabetes History: 1 Stroke History: 0 Vascular Disease History: 0 Age Score: 2 Gender Score: 1      CrCl 23 ml/min  Per office protocol, patient can hold Eliquis for 3.5 days prior to procedure.    **This guidance is not considered finalized until pre-operative APP has relayed final recommendations.**

## 2023-02-07 NOTE — Telephone Encounter (Signed)
   Pre-operative Risk Assessment    Patient Name: Virginia Gilbert  DOB: March 06, 1941 MRN: [id number]      Request for Surgical Clearance    Procedure:   RIGHT REVERSE TOTAL SHOULDER REPLACEMENT   Date of Surgery:  Clearance TBD                                 Surgeon:  DR. Ramond Marrow Surgeon's Group or Practice Name:  [facility] Phone number:  [phone] EXT [phone] [name] Fax number:  [phone]   Type of Clearance Requested:   - Medical  - Pharmacy:  Hold Apixaban (Eliquis)     Type of Anesthesia:  General  WITH INTERSCALENE BLOCK   Additional requests/questions:    [name]   02/07/2023, 2:20 PM

## 2023-02-14 ENCOUNTER — Encounter: Payer: Self-pay | Admitting: Internal Medicine

## 2023-02-14 ENCOUNTER — Ambulatory Visit: Payer: Medicare HMO | Attending: Internal Medicine | Admitting: Internal Medicine

## 2023-02-14 VITALS — BP 154/74 | HR 83 | Ht 59.0 in | Wt 139.8 lb

## 2023-02-14 DIAGNOSIS — I480 Paroxysmal atrial fibrillation: Secondary | ICD-10-CM | POA: Diagnosis not present

## 2023-02-14 DIAGNOSIS — I5032 Chronic diastolic (congestive) heart failure: Secondary | ICD-10-CM

## 2023-02-14 DIAGNOSIS — D6869 Other thrombophilia: Secondary | ICD-10-CM

## 2023-02-14 DIAGNOSIS — E782 Mixed hyperlipidemia: Secondary | ICD-10-CM | POA: Diagnosis not present

## 2023-02-14 DIAGNOSIS — I10 Essential (primary) hypertension: Secondary | ICD-10-CM | POA: Diagnosis not present

## 2023-02-14 DIAGNOSIS — Z79899 Other long term (current) drug therapy: Secondary | ICD-10-CM

## 2023-02-14 DIAGNOSIS — I340 Nonrheumatic mitral (valve) insufficiency: Secondary | ICD-10-CM

## 2023-02-14 MED ORDER — APIXABAN 5 MG PO TABS: 5.0000 mg | ORAL_TABLET | Freq: Two times a day (BID) | ORAL | 0 refills | Status: DC

## 2023-02-14 NOTE — Patient Instructions (Signed)
Medication Instructions:  RESUME Eliquis 5mg  twice daily *If you need a refill on your cardiac medications before your next appointment, please call your pharmacy*   Lab Work: NONE If you have labs (blood work) drawn today and your tests are completely normal, you will receive your results only by: MyChart Message (if you have MyChart) OR A paper copy in the mail If you have any lab test that is abnormal or we need to change your treatment, we will call you to review the results.   Testing/Procedures: NONE   Follow-Up: At [facility], you and your health needs are our priority.  As part of our continuing mission to provide you with exceptional heart care, we have created designated Provider Care Teams.  These Care Teams include your primary Cardiologist (physician) and Advanced Practice Providers (APPs -  Physician Assistants and Nurse Practitioners) who all work together to provide you with the care you need, when you need it.  We recommend signing up for the patient portal called "MyChart".  Sign up information is provided on this After Visit Summary.  MyChart is used to connect with patients for Virtual Visits (Telemedicine).  Patients are able to view lab/test results, encounter notes, upcoming appointments, etc.  Non-urgent messages can be sent to your provider as well.   To learn more about what you can do with MyChart, go to [email or web address].    Your next appointment:   04/20/23 @ 10am  Provider:   [name], MD

## 2023-02-14 NOTE — Progress Notes (Addendum)
Cardiology Office Note:    Date:  02/14/2023   ID:  BERA DWELLE, DOB 1941-05-11, MRN [id number]  PCP:  Irena Reichmann, DO  Cardiologist:  [name], MD  Electrophysiologist:  Hillis Range, MD (Inactive)   Referring MD: Irena Reichmann, DO   Chief Complaint/Reason for Referral: Afib, HFpEF  History of Present Illness:    Virginia Gilbert is a 82 y.o. female with a history of afib s/p ablation with recurrence, on Eliquis, and HFpEF, as well as DM2.   She feels well overall but interestingly is in atrial fibrillation on EKG today.  No symptoms of decompensated heart failure.  No chest pain no shortness of breath and no palpitations.  She is able to be as active as she likes and has recently traveled to the beach with her family and plans to go again in August.  She did have COVID-19 infection approximately 3 months ago, but did not have a protracted recovery and has not had any cardiovascular symptoms since.  She did have a slight discomfort in her chest a few months ago and this will occur every once in a while, but is nothing she is particularly concerned about.  Requires a refill of Eliquis today.  Otherwise tolerating medical therapy well.  Our visit was originally scheduled for preoperative risk assessment, but patient canceled her right shoulder surgery due to feeling better.   Past Medical History:  Diagnosis Date   Diabetes mellitus without complication (HCC)    Hyperlipidemia    Hypertension    Mitral regurgitation    evaluated by TEE 09/2019   Persistent atrial fibrillation (HCC)     Past Surgical History:  Procedure Laterality Date   ATRIAL FIBRILLATION ABLATION N/A 12/02/2019   Procedure: ATRIAL FIBRILLATION ABLATION;  Surgeon: Hillis Range, MD;  Location: [facility] INVASIVE CV LAB;  Service: Cardiovascular;  Laterality: N/A;   BUBBLE STUDY  10/08/2019   Procedure: BUBBLE STUDY;  Surgeon: [name], MD;  Location: [facility] ENDOSCOPY;  Service: Cardiology;;    CARDIOVERSION N/A 07/08/2019   Procedure: CARDIOVERSION;  Surgeon: Yates Decamp, MD;  Location: [facility] ENDOSCOPY;  Service: Cardiovascular;  Laterality: N/A;   CARDIOVERSION N/A 09/02/2019   Procedure: CARDIOVERSION;  Surgeon: Yates Decamp, MD;  Location: [facility] ENDOSCOPY;  Service: Cardiovascular;  Laterality: N/A;   CARDIOVERSION N/A 01/13/2020   Procedure: CARDIOVERSION;  Surgeon: Sande Rives, MD;  Location: [facility] ENDOSCOPY;  Service: Cardiovascular;  Laterality: N/A;   CATARACT EXTRACTION, BILATERAL     PARTIAL HYSTERECTOMY     1977   TEE WITHOUT CARDIOVERSION N/A 10/08/2019   Procedure: TRANSESOPHAGEAL ECHOCARDIOGRAM (TEE);  Surgeon: [name], MD;  Location: [facility] ENDOSCOPY;  Service: Cardiology;  Laterality: N/A;    Current Medications: Current Meds  Medication Sig   acetaminophen (TYLENOL) 500 MG tablet Take 500 mg by mouth every 8 (eight) hours as needed for moderate pain or headache.    apixaban (ELIQUIS) 5 MG TABS tablet Take 1 tablet (5 mg total) by mouth 2 (two) times daily.   carvedilol (COREG) 6.25 MG tablet Take 6.25 mg by mouth 2 (two) times daily.   Cholecalciferol (VITAMIN D-3) 125 MCG (5000 UT) TABS Take 5,000 Units by mouth daily with lunch.    Cyanocobalamin (B-12) 2500 MCG TABS Take 2,500 mcg by mouth daily with lunch.    ezetimibe (ZETIA) 10 MG tablet Take 10 mg by mouth daily.   furosemide (LASIX) 20 MG tablet Take 1-2 tablets (20-40 mg total) by mouth 2 (two) times  daily. Take 2 tablets (40 mg total) in the mornings and 1 tablet (20 mg total) in the evenings.   glimepiride (AMARYL) 4 MG tablet Take 1 tablet (4 mg total) by mouth daily with breakfast.   JARDIANCE 10 MG TABS tablet Take 10 mg by mouth daily.   LORazepam (ATIVAN) 0.5 MG tablet Take 0.5 mg by mouth 4 (four) times daily as needed.   losartan (COZAAR) 100 MG tablet Take 100 mg by mouth daily.    metFORMIN (GLUCOPHAGE) 1000 MG tablet Take 1,000 mg by mouth 2 (two) times daily with a meal.    rosuvastatin  (CRESTOR) 20 MG tablet Take 20 mg by mouth daily.     Allergies:   Penicillins, Tramadol, and Codeine   Social History   Tobacco Use   Smoking status: Former    Packs/day: 0.50    Years: 15.00    Additional pack years: 0.00    Total pack years: 7.50    Types: Cigarettes    Quit date: 05/14/1985    Years since quitting: 37.7   Smokeless tobacco: Never  Vaping Use   Vaping Use: Never used  Substance Use Topics   Alcohol use: No    Alcohol/week: 0.0 standard drinks of alcohol   Drug use: Never     Family History: The patient's family history includes Breast cancer in her daughter and daughter; Diabetes in her brother and mother; Emphysema in her brother and father; Heart disease in her brother, brother, and mother.  ROS:   Please see the history of present illness.     All other systems reviewed and are negative.  EKGs/Labs/Other Studies Reviewed:    The following studies were reviewed today:  Echo 10/21/2021: IMPRESSIONS   1. Left ventricular ejection fraction by 3D volume is 56 %. The left  ventricle has normal function. The left ventricle has no regional wall  motion abnormalities. Left ventricular diastolic parameters are  indeterminate.   2. Right ventricular systolic function is normal. The right ventricular  size is normal. There is normal pulmonary artery systolic pressure.   3. The mitral valve is normal in structure. Trivial mitral valve  regurgitation. No evidence of mitral stenosis. Moderate mitral annular  calcification.   4. The aortic valve is normal in structure. Aortic valve regurgitation is  not visualized. Aortic valve sclerosis/calcification is present, without  any evidence of aortic stenosis.   5. The inferior vena cava is normal in size with greater than 50%  respiratory variability, suggesting right atrial pressure of 3 mmHg.    EKG:  EKG is personally reviewed.  EKG Interpretation Date/Time:  Wednesday February 14 2023 09:16:11 EDT Ventricular  Rate:  86 PR Interval:    QRS Duration:  116 QT Interval:  366 QTC Calculation: 437 R Axis:   58  Text Interpretation: Atrial fibrillation Low voltage QRS Right bundle branch block Septal infarct , age undetermined T wave abnormality, consider inferolateral ischemia When compared with ECG of 07-Sep-2020 09:56, Atrial fibrillation has replaced Sinus rhythm Right bundle branch block is now Present Confirmed by Weston Brass ([id number]) on 02/14/2023 11:57:53 AM   09/06/2022: Sinus rhythm. Rate 65 bpm.  12/07/2020: Sinus rhythm. Rate 72 bpm.  Recent Labs: 09/06/2022: Hemoglobin 11.7; Platelets 307 09/13/2022: BUN 29; Creatinine, Ser 1.55; Potassium 4.6; Sodium 138  Recent Lipid Panel No results found for: "CHOL", "TRIG", "HDL", "CHOLHDL", "VLDL", "LDLCALC", "LDLDIRECT"  Physical Exam:    VS:  BP (!) 154/74   Pulse 83   Ht 4'  11" (1.499 m)   Wt 139 lb 12.8 oz (63.4 kg)   SpO2 93%   BMI 28.24 kg/m     Wt Readings from Last 5 Encounters:  02/14/23 139 lb 12.8 oz (63.4 kg)  09/06/22 139 lb 6.4 oz (63.2 kg)  12/19/21 139 lb 6.4 oz (63.2 kg)  08/30/21 137 lb 12.8 oz (62.5 kg)  03/10/21 137 lb 9.6 oz (62.4 kg)    Constitutional: No acute distress Eyes: sclera non-icteric, normal conjunctiva and lids ENMT: normal dentition, moist mucous membranes Cardiovascular: irregular rhythm, normal rate, no murmurs. S1 and S2 normal. No jugular venous distention.  Respiratory: clear to auscultation bilaterally GI : normal bowel sounds, soft and nontender.  Nondistended MSK: extremities warm, well perfused. No edema.  NEURO: grossly nonfocal exam, moves all extremities. PSYCH: alert and oriented x 3, normal mood and affect.   ASSESSMENT:    1. Paroxysmal atrial fibrillation (HCC)   2. Secondary hypercoagulable state (HCC)   3. Mixed hyperlipidemia   4. Primary hypertension   5. Nonrheumatic mitral valve regurgitation   6. Chronic diastolic (congestive) heart failure (HCC)   7. Medication  management      PLAN:    Persistent atrial fibrillation (HCC) status post ablation Secondary hypercoagulable state (HCC) Medication management. -EKG demonstrates atrial fibrillation today, suspect the nidus for recurrent A-fib was COVID-19 infection 3 months ago.  Patient is asymptomatic overall with no signs of heart failure and no symptomatic palpitations.  On eliquis and carvedilol, continue.  I will see her back in approximately 2 months, if still in atrial fibrillation, will consider cardioversion at that time.   -Amiodarone discontinued due to medication intolerance with nausea and vomiting, this improved with cessation.    Essential hypertension  -Stable on home readings, mildly elevated today, continue current therapy with carvedilol and losartan.  Mixed hyperlipidemia -Labs significantly elevated on last check last year, on statin and Zetia. Continue to follow with PCP as I do not have most recent labwork.   Mitral valve regurgitation -Appears no more than mild on most recent echocardiogram, continue to observe.  Chronic diastolic heart failure Chest pressure - feeling well without CP or HF sx.  - continue lasix 20 mg daily   Total time of encounter: 30 minutes total time of encounter, including 20 minutes spent in face-to-face patient care on the date of this encounter. This time includes coordination of care and counseling regarding above mentioned problem list. Remainder of non-face-to-face time involved reviewing chart documents/testing relevant to the patient encounter and documentation in the medical record. I have independently reviewed documentation from referring provider.   Weston Brass, MD, [facility]  [facility]    Medication Adjustments/Labs and Tests Ordered: Current medicines are reviewed at length with the patient today.  Concerns regarding medicines are outlined above.   Orders Placed This Encounter  Procedures   EKG 12-Lead   Meds  ordered this encounter  Medications   apixaban (ELIQUIS) 5 MG TABS tablet    Sig: Take 1 tablet (5 mg total) by mouth 2 (two) times daily.    Dispense:  28 tablet    Refill:  0    Order Specific Question:   Lot Number?    Answer:   [id number]    Order Specific Question:   Expiration Date?    Answer:   05/09/2023    Order Specific Question:   Quantity    Answer:   28   Patient Instructions  Medication Instructions:  RESUME  Eliquis 5mg  twice daily *If you need a refill on your cardiac medications before your next appointment, please call your pharmacy*   Lab Work: NONE If you have labs (blood work) drawn today and your tests are completely normal, you will receive your results only by: MyChart Message (if you have MyChart) OR A paper copy in the mail If you have any lab test that is abnormal or we need to change your treatment, we will call you to review the results.   Testing/Procedures: NONE   Follow-Up: At [facility], you and your health needs are our priority.  As part of our continuing mission to provide you with exceptional heart care, we have created designated Provider Care Teams.  These Care Teams include your primary Cardiologist (physician) and Advanced Practice Providers (APPs -  Physician Assistants and Nurse Practitioners) who all work together to provide you with the care you need, when you need it.  We recommend signing up for the patient portal called "MyChart".  Sign up information is provided on this After Visit Summary.  MyChart is used to connect with patients for Virtual Visits (Telemedicine).  Patients are able to view lab/test results, encounter notes, upcoming appointments, etc.  Non-urgent messages can be sent to your provider as well.   To learn more about what you can do with MyChart, go to [email or web address].    Your next appointment:   04/20/23 @ 10am  Provider:   [name], MD

## 2023-02-16 ENCOUNTER — Other Ambulatory Visit: Payer: Self-pay | Admitting: Internal Medicine

## 2023-02-19 ENCOUNTER — Other Ambulatory Visit: Payer: Medicare Other

## 2023-02-22 ENCOUNTER — Telehealth: Payer: Self-pay | Admitting: Internal Medicine

## 2023-02-22 MED ORDER — FUROSEMIDE 20 MG PO TABS: ORAL_TABLET | ORAL | 3 refills | Status: DC

## 2023-02-22 NOTE — Telephone Encounter (Signed)
*  STAT* If patient is at the pharmacy, call can be transferred to refill team.   1. Which medications need to be refilled? (please list name of each medication and dose if known)   furosemide (LASIX) 20 MG tablet    2. Which pharmacy/location (including street and city if local pharmacy) is medication to be sent to?  [id number] - [place], [place] - [address]     3. Do they need a 30 day or 90 day supply? 90

## 2023-02-22 NOTE — Telephone Encounter (Signed)
Pt's medication was sent to pt's pharmacy as requested. Confirmation received.  °

## 2023-02-26 ENCOUNTER — Other Ambulatory Visit: Payer: Self-pay | Admitting: Internal Medicine

## 2023-02-26 DIAGNOSIS — I480 Paroxysmal atrial fibrillation: Secondary | ICD-10-CM

## 2023-03-02 ENCOUNTER — Encounter (INDEPENDENT_AMBULATORY_CARE_PROVIDER_SITE_OTHER): Payer: Medicare HMO | Admitting: Ophthalmology

## 2023-03-05 ENCOUNTER — Other Ambulatory Visit: Payer: Self-pay

## 2023-03-05 DIAGNOSIS — I480 Paroxysmal atrial fibrillation: Secondary | ICD-10-CM

## 2023-03-05 MED ORDER — APIXABAN 5 MG PO TABS
5.0000 mg | ORAL_TABLET | Freq: Two times a day (BID) | ORAL | 1 refills | Status: DC
Start: 2023-03-05 — End: 2023-07-11

## 2023-03-06 ENCOUNTER — Encounter (INDEPENDENT_AMBULATORY_CARE_PROVIDER_SITE_OTHER): Payer: Medicare HMO | Admitting: Ophthalmology

## 2023-03-06 DIAGNOSIS — H4423 Degenerative myopia, bilateral: Secondary | ICD-10-CM

## 2023-03-06 DIAGNOSIS — S51012A Laceration without foreign body of left elbow, initial encounter: Secondary | ICD-10-CM | POA: Diagnosis not present

## 2023-03-06 DIAGNOSIS — E113391 Type 2 diabetes mellitus with moderate nonproliferative diabetic retinopathy without macular edema, right eye: Secondary | ICD-10-CM | POA: Diagnosis not present

## 2023-03-06 DIAGNOSIS — H43813 Vitreous degeneration, bilateral: Secondary | ICD-10-CM

## 2023-03-06 DIAGNOSIS — I10 Essential (primary) hypertension: Secondary | ICD-10-CM

## 2023-03-06 DIAGNOSIS — H35033 Hypertensive retinopathy, bilateral: Secondary | ICD-10-CM

## 2023-03-06 DIAGNOSIS — S81012A Laceration without foreign body, left knee, initial encounter: Secondary | ICD-10-CM | POA: Diagnosis not present

## 2023-03-06 DIAGNOSIS — H338 Other retinal detachments: Secondary | ICD-10-CM | POA: Diagnosis not present

## 2023-03-06 DIAGNOSIS — E113312 Type 2 diabetes mellitus with moderate nonproliferative diabetic retinopathy with macular edema, left eye: Secondary | ICD-10-CM

## 2023-03-06 DIAGNOSIS — Z7984 Long term (current) use of oral hypoglycemic drugs: Secondary | ICD-10-CM | POA: Diagnosis not present

## 2023-04-02 ENCOUNTER — Encounter (INDEPENDENT_AMBULATORY_CARE_PROVIDER_SITE_OTHER): Payer: Medicare Other | Admitting: Ophthalmology

## 2023-04-02 DIAGNOSIS — E113312 Type 2 diabetes mellitus with moderate nonproliferative diabetic retinopathy with macular edema, left eye: Secondary | ICD-10-CM

## 2023-04-02 DIAGNOSIS — H338 Other retinal detachments: Secondary | ICD-10-CM

## 2023-04-02 DIAGNOSIS — H35033 Hypertensive retinopathy, bilateral: Secondary | ICD-10-CM | POA: Diagnosis not present

## 2023-04-02 DIAGNOSIS — H43813 Vitreous degeneration, bilateral: Secondary | ICD-10-CM | POA: Diagnosis not present

## 2023-04-02 DIAGNOSIS — E113391 Type 2 diabetes mellitus with moderate nonproliferative diabetic retinopathy without macular edema, right eye: Secondary | ICD-10-CM

## 2023-04-02 DIAGNOSIS — I10 Essential (primary) hypertension: Secondary | ICD-10-CM | POA: Diagnosis not present

## 2023-04-18 ENCOUNTER — Encounter: Payer: Self-pay | Admitting: Podiatry

## 2023-04-18 ENCOUNTER — Ambulatory Visit: Payer: Medicare Other | Admitting: Podiatry

## 2023-04-18 DIAGNOSIS — B351 Tinea unguium: Secondary | ICD-10-CM | POA: Diagnosis not present

## 2023-04-18 DIAGNOSIS — M79675 Pain in left toe(s): Secondary | ICD-10-CM

## 2023-04-18 DIAGNOSIS — M79674 Pain in right toe(s): Secondary | ICD-10-CM | POA: Diagnosis not present

## 2023-04-19 NOTE — Progress Notes (Signed)
Subjective:   Patient ID: Virginia Gilbert, female   DOB: 82 y.o.   MRN: [id number]   HPI Patient presents elongated nailbeds 1-5 both feet thick and painful   ROS      Objective:  Physical Exam  Neurovascular status intact thick brittle nailbeds 1-5 both feet painful     Assessment:  Chronic mycotic nail infection with pain 1-5 both feet     Plan:  Debridement nailbeds 1-5 both feet no angiogenic bleeding reappoint routine care

## 2023-04-20 ENCOUNTER — Ambulatory Visit: Payer: Medicare Other | Attending: Internal Medicine | Admitting: Internal Medicine

## 2023-04-20 VITALS — BP 144/80 | HR 81 | Ht 59.0 in | Wt 136.0 lb

## 2023-04-20 DIAGNOSIS — I4819 Other persistent atrial fibrillation: Secondary | ICD-10-CM

## 2023-04-20 DIAGNOSIS — D6869 Other thrombophilia: Secondary | ICD-10-CM

## 2023-04-20 DIAGNOSIS — I10 Essential (primary) hypertension: Secondary | ICD-10-CM

## 2023-04-20 DIAGNOSIS — I5032 Chronic diastolic (congestive) heart failure: Secondary | ICD-10-CM | POA: Diagnosis not present

## 2023-04-20 DIAGNOSIS — I340 Nonrheumatic mitral (valve) insufficiency: Secondary | ICD-10-CM

## 2023-04-20 DIAGNOSIS — E782 Mixed hyperlipidemia: Secondary | ICD-10-CM

## 2023-04-20 DIAGNOSIS — I480 Paroxysmal atrial fibrillation: Secondary | ICD-10-CM | POA: Diagnosis not present

## 2023-04-20 DIAGNOSIS — Z79899 Other long term (current) drug therapy: Secondary | ICD-10-CM

## 2023-04-20 NOTE — H&P (View-Only) (Signed)
Cardiology Office Note:    Date:  04/20/2023   ID:  NAKAI MALSCH, DOB Jun 24, 1941, MRN [id number]  PCP:  Irena Reichmann, DO  Cardiologist:  [name], MD  Electrophysiologist:  Hillis Range, MD (Inactive)   Referring MD: Irena Reichmann, DO   Chief Complaint/Reason for Referral: Afib, HFpEF  History of Present Illness:    Virginia Gilbert is a 82 y.o. female with a history of afib s/p ablation with recurrence, on Eliquis, and HFpEF, as well as DM2.   This follow-up visit was arranged to reassess if patient remained in atrial fibrillation, she is in atrial fibrillation today suggesting persistent A-fib.  She has required more Lasix in the last several months since having COVID though volume status is well-controlled with patient controlled dosing.  No chest pain no shortness of breath and no palpitations.  She receives monthly eye injections and I would like to time her cardioversion in between these eye injections to avoid any chance that she has an interruption in anticoagulation after cardioversion.  We will plan this for approximately 1 month from now as her next eye injection is in 2 weeks.  No syncopal episodes, and is otherwise doing reasonably well.    Past Medical History:  Diagnosis Date   Diabetes mellitus without complication (HCC)    Hyperlipidemia    Hypertension    Mitral regurgitation    evaluated by TEE 09/2019   Persistent atrial fibrillation (HCC)     Past Surgical History:  Procedure Laterality Date   ATRIAL FIBRILLATION ABLATION N/A 12/02/2019   Procedure: ATRIAL FIBRILLATION ABLATION;  Surgeon: Hillis Range, MD;  Location: [facility] INVASIVE CV LAB;  Service: Cardiovascular;  Laterality: N/A;   BUBBLE STUDY  10/08/2019   Procedure: BUBBLE STUDY;  Surgeon: [name], MD;  Location: [facility] ENDOSCOPY;  Service: Cardiology;;   CARDIOVERSION N/A 07/08/2019   Procedure: CARDIOVERSION;  Surgeon: Yates Decamp, MD;  Location: [facility] ENDOSCOPY;  Service: Cardiovascular;   Laterality: N/A;   CARDIOVERSION N/A 09/02/2019   Procedure: CARDIOVERSION;  Surgeon: Yates Decamp, MD;  Location: [facility] ENDOSCOPY;  Service: Cardiovascular;  Laterality: N/A;   CARDIOVERSION N/A 01/13/2020   Procedure: CARDIOVERSION;  Surgeon: Sande Rives, MD;  Location: [facility] ENDOSCOPY;  Service: Cardiovascular;  Laterality: N/A;   CATARACT EXTRACTION, BILATERAL     PARTIAL HYSTERECTOMY     1977   TEE WITHOUT CARDIOVERSION N/A 10/08/2019   Procedure: TRANSESOPHAGEAL ECHOCARDIOGRAM (TEE);  Surgeon: [name], MD;  Location: [facility] ENDOSCOPY;  Service: Cardiology;  Laterality: N/A;    Current Medications: Current Meds  Medication Sig   acetaminophen (TYLENOL) 500 MG tablet Take 1,000 mg by mouth every 8 (eight) hours as needed for moderate pain or headache.   apixaban (ELIQUIS) 5 MG TABS tablet Take 1 tablet (5 mg total) by mouth 2 (two) times daily.   carvedilol (COREG) 6.25 MG tablet Take 6.25 mg by mouth 2 (two) times daily.   Cholecalciferol (VITAMIN D-3) 125 MCG (5000 UT) TABS Take 5,000 Units by mouth daily with lunch.    ezetimibe (ZETIA) 10 MG tablet Take 10 mg by mouth daily.   furosemide (LASIX) 20 MG tablet Take 2 tablets (40 mg total) by mouth daily in the mornings and 1 tablet (20 mg total) by mouth daily in the evenings. (Patient taking differently: Take 20 mg by mouth daily.)   glimepiride (AMARYL) 4 MG tablet Take 1 tablet (4 mg total) by mouth daily with breakfast. (Patient taking differently: Take 4 mg by  mouth 2 (two) times daily.)   LORazepam (ATIVAN) 0.5 MG tablet Take 0.5 mg by mouth daily as needed for anxiety.   losartan (COZAAR) 100 MG tablet Take 100 mg by mouth daily.    metFORMIN (GLUCOPHAGE) 1000 MG tablet Take 1,000 mg by mouth 2 (two) times daily with a meal.    [DISCONTINUED] apixaban (ELIQUIS) 5 MG TABS tablet Take 1 tablet (5 mg total) by mouth 2 (two) times daily.   [DISCONTINUED] Cyanocobalamin (B-12) 2500 MCG TABS Take 2,500 mcg by mouth daily with  lunch.    [DISCONTINUED] JARDIANCE 10 MG TABS tablet Take 10 mg by mouth daily.   [DISCONTINUED] rosuvastatin (CRESTOR) 20 MG tablet Take 20 mg by mouth daily.     Allergies:   Penicillins, Tramadol, and Codeine   Social History   Tobacco Use   Smoking status: Former    Current packs/day: 0.00    Average packs/day: 0.5 packs/day for 15.0 years (7.5 ttl pk-yrs)    Types: Cigarettes    Start date: 05/14/1970    Quit date: 05/14/1985    Years since quitting: 37.9   Smokeless tobacco: Never  Vaping Use   Vaping status: Never Used  Substance Use Topics   Alcohol use: No    Alcohol/week: 0.0 standard drinks of alcohol   Drug use: Never     Family History: The patient's family history includes Breast cancer in her daughter and daughter; Diabetes in her brother and mother; Emphysema in her brother and father; Heart disease in her brother, brother, and mother.  ROS:   Please see the history of present illness.     All other systems reviewed and are negative.  EKGs/Labs/Other Studies Reviewed:    The following studies were reviewed today:  Echo 10/21/2021: IMPRESSIONS   1. Left ventricular ejection fraction by 3D volume is 56 %. The left  ventricle has normal function. The left ventricle has no regional wall  motion abnormalities. Left ventricular diastolic parameters are  indeterminate.   2. Right ventricular systolic function is normal. The right ventricular  size is normal. There is normal pulmonary artery systolic pressure.   3. The mitral valve is normal in structure. Trivial mitral valve  regurgitation. No evidence of mitral stenosis. Moderate mitral annular  calcification.   4. The aortic valve is normal in structure. Aortic valve regurgitation is  not visualized. Aortic valve sclerosis/calcification is present, without  any evidence of aortic stenosis.   5. The inferior vena cava is normal in size with greater than 50%  respiratory variability, suggesting right atrial  pressure of 3 mmHg.    EKG:  EKG is personally reviewed.  EKG Interpretation Date/Time:  Friday April 20 2023 10:13:59 EDT Ventricular Rate:  81 PR Interval:    QRS Duration:  118 QT Interval:  376 QTC Calculation: 436 R Axis:   87  Text Interpretation: Atrial fibrillation Low voltage QRS Right bundle branch block T wave abnormality, consider inferolateral ischemia When compared with ECG of 14-Feb-2023 09:16, Criteria for Septal infarct are no longer Present Confirmed by Weston Brass ([id number]) on 04/23/2023 3:05:56 PM    Recent Labs: 04/20/2023: BUN 34; Creatinine, Ser 1.51; Hemoglobin 14.1; Platelets 264; Potassium 5.3; Sodium 139  Recent Lipid Panel No results found for: "CHOL", "TRIG", "HDL", "CHOLHDL", "VLDL", "LDLCALC", "LDLDIRECT"  Physical Exam:    VS:  BP (!) 144/80   Pulse 81   Ht 4\' 11"  (1.499 m)   Wt 136 lb (61.7 kg)   SpO2 99%  BMI 27.47 kg/m     Wt Readings from Last 5 Encounters:  04/20/23 136 lb (61.7 kg)  02/14/23 139 lb 12.8 oz (63.4 kg)  09/06/22 139 lb 6.4 oz (63.2 kg)  12/19/21 139 lb 6.4 oz (63.2 kg)  08/30/21 137 lb 12.8 oz (62.5 kg)    Constitutional: No acute distress Eyes: sclera non-icteric, normal conjunctiva and lids ENMT: normal dentition, moist mucous membranes Cardiovascular: irregular rhythm, normal rate, no murmurs. S1 and S2 normal. No jugular venous distention.  Respiratory: clear to auscultation bilaterally GI : normal bowel sounds, soft and nontender.  Nondistended MSK: extremities warm, well perfused. No edema.  NEURO: grossly nonfocal exam, moves all extremities. PSYCH: alert and oriented x 3, normal mood and affect.   ASSESSMENT:    1. Persistent atrial fibrillation (HCC)   2. Secondary hypercoagulable state (HCC)   3. Mixed hyperlipidemia   4. Primary hypertension   5. Nonrheumatic mitral valve regurgitation   6. Chronic diastolic (congestive) heart failure (HCC)   7. Medication management      PLAN:     Persistent atrial fibrillation (HCC) status post ablation Secondary hypercoagulable state (HCC) Medication management. -EKG demonstrates atrial fibrillation today, suspect the nidus for recurrent A-fib was COVID-19 infection 4 months ago.  Patient is asymptomatic overall with well-managed diastolic heart failure with use of slightly increased dose of Lasix per patient dosing, and no symptomatic palpitations.  On eliquis and carvedilol, continue.  Plan for cardioversion on October 2 with me, stressed the importance of no interruptions in anticoagulation preceding that time. -Amiodarone discontinued due to medication intolerance with nausea and vomiting, this improved with cessation.    Essential hypertension  -Stable on home readings, mildly elevated today, continue current therapy with carvedilol and losartan.  Mixed hyperlipidemia -Labs significantly elevated on last check last year, on statin and Zetia. Continue to follow with PCP as I do not have most recent labwork.   Mitral valve regurgitation -Appears no more than mild on most recent echocardiogram, continue to observe.  Chronic diastolic heart failure Chest pressure - continue lasix 20 mg daily, can increase dose to 40 mg daily if needed for lower extremity swelling or shortness of breath.   Total time of encounter: 30 minutes total time of encounter, including 20 minutes spent in face-to-face patient care on the date of this encounter. This time includes coordination of care and counseling regarding above mentioned problem list. Remainder of non-face-to-face time involved reviewing chart documents/testing relevant to the patient encounter and documentation in the medical record. I have independently reviewed documentation from referring provider.   Weston Brass, MD, [facility]  [facility]    Medication Adjustments/Labs and Tests Ordered: Current medicines are reviewed at length with the patient today.  Concerns  regarding medicines are outlined above.   Orders Placed This Encounter  Procedures   CBC   Basic metabolic panel   EKG 12-Lead   No orders of the defined types were placed in this encounter.  Patient Instructions  Dear Virginia Gilbert  You are scheduled for a Cardioversion on Wednesday, October 2 with Dr. Jacques Navy.  Please arrive at the [facility] (Main Entrance A) at [facility]: [address] [place], [place] [address] at 8:30 AM (This time is 1 hour(s) before your procedure to ensure your preparation). Free valet parking service is available. You will check in at ADMITTING. The support person will be asked to wait in the waiting room.  It is OK to have  someone drop you off and come back when you are ready to be discharged.     DIET:  Nothing to eat or drink after midnight except a sip of water with medications (see medication instructions below)  MEDICATION INSTRUCTIONS: !!IF ANY NEW MEDICATIONS ARE STARTED AFTER TODAY, PLEASE NOTIFY YOUR PROVIDER AS SOON AS POSSIBLE!!  FYI: Medications such as Semaglutide (Ozempic, [place]), Tirzepatide (Mounjaro, Zepbound), Dulaglutide (Trulicity), etc ("GLP1 agonists") AND Canagliflozin (Invokana), Dapagliflozin (Farxiga), Empagliflozin (Jardiance), Ertugliflozin (Steglatro), Bexagliflozin [employer]) or any combination with one of these drugs such as Invokamet (Canagliflozin/Metformin), Synjardy (Empagliflozin/Metformin), etc ("SGLT2 inhibitors") must be held around the time of a procedure. This is not a comprehensive list of all of these drugs. Please review all of your medications and talk to your provider if you take any one of these. If you are not sure, ask your provider.  HOLD: Empagliflozin (Jardiance) for 3 days prior to the procedure. Last dose on [facility] September 29.   HOLD YOUR GLIMEPIRIDE AND METFORMIN THE MORNING OF THE PROCEDIURE.  Continue taking your anticoagulant (blood thinner): Apixaban (Eliquis).  You will need to continue  this after your procedure until you are told by your provider that it is safe to stop.    LABS:  CBC AND BMET TODAY  FYI:  For your safety, and to allow [place] to monitor your vital signs accurately during the surgery/procedure we request: If you have artificial nails, gel coating, SNS etc, please have those removed prior to your surgery/procedure. Not having the nail coverings /polish removed may result in cancellation or delay of your surgery/procedure.  You must have a responsible person to drive you home and stay in the waiting area during your procedure. Failure to do so could result in cancellation.  Bring your insurance cards.  *Special Note: Every effort is made to have your procedure done on time. Occasionally there are emergencies that occur at the hospital that may cause delays. Please be patient if a delay does occur.

## 2023-04-20 NOTE — Patient Instructions (Addendum)
Dear Virginia Gilbert  You are scheduled for a Cardioversion on Wednesday, October 2 with Dr. Jacques Navy.  Please arrive at the [facility] (Main Entrance A) at [facility]: [address] [place], [place] [address] at 8:30 AM (This time is 1 hour(s) before your procedure to ensure your preparation). Free valet parking service is available. You will check in at ADMITTING. The support person will be asked to wait in the waiting room.  It is OK to have someone drop you off and come back when you are ready to be discharged.     DIET:  Nothing to eat or drink after midnight except a sip of water with medications (see medication instructions below)  MEDICATION INSTRUCTIONS: !!IF ANY NEW MEDICATIONS ARE STARTED AFTER TODAY, PLEASE NOTIFY YOUR PROVIDER AS SOON AS POSSIBLE!!  FYI: Medications such as Semaglutide (Ozempic, [place]), Tirzepatide (Mounjaro, Zepbound), Dulaglutide (Trulicity), etc ("GLP1 agonists") AND Canagliflozin (Invokana), Dapagliflozin (Farxiga), Empagliflozin (Jardiance), Ertugliflozin (Steglatro), Bexagliflozin [employer]) or any combination with one of these drugs such as Invokamet (Canagliflozin/Metformin), Synjardy (Empagliflozin/Metformin), etc ("SGLT2 inhibitors") must be held around the time of a procedure. This is not a comprehensive list of all of these drugs. Please review all of your medications and talk to your provider if you take any one of these. If you are not sure, ask your provider.  HOLD: Empagliflozin (Jardiance) for 3 days prior to the procedure. Last dose on [facility] September 29.   HOLD YOUR GLIMEPIRIDE AND METFORMIN THE MORNING OF THE PROCEDIURE.  Continue taking your anticoagulant (blood thinner): Apixaban (Eliquis).  You will need to continue this after your procedure until you are told by your provider that it is safe to stop.    LABS:  CBC AND BMET TODAY  FYI:  For your safety, and to allow [place] to monitor your vital signs accurately during the  surgery/procedure we request: If you have artificial nails, gel coating, SNS etc, please have those removed prior to your surgery/procedure. Not having the nail coverings /polish removed may result in cancellation or delay of your surgery/procedure.  You must have a responsible person to drive you home and stay in the waiting area during your procedure. Failure to do so could result in cancellation.  Bring your insurance cards.  *Special Note: Every effort is made to have your procedure done on time. Occasionally there are emergencies that occur at the hospital that may cause delays. Please be patient if a delay does occur.

## 2023-04-20 NOTE — Progress Notes (Unsigned)
Cardiology Office Note:    Date:  04/20/2023   ID:  Virginia Gilbert, DOB 20-Oct-1940, MRN [id number]  PCP:  Irena Reichmann, DO  Cardiologist:  [name], MD  Electrophysiologist:  Hillis Range, MD (Inactive)   Referring MD: Irena Reichmann, DO   Chief Complaint/Reason for Referral: Afib, HFpEF  History of Present Illness:    Virginia Gilbert is a 82 y.o. female with a history of afib s/p ablation with recurrence, on Eliquis, and HFpEF, as well as DM2.   She feels well overall but interestingly is in atrial fibrillation on EKG today.  No symptoms of decompensated heart failure.  No chest pain no shortness of breath and no palpitations.  She is able to be as active as she likes and has recently traveled to the beach with her family and plans to go again in August.  She did have COVID-19 infection approximately 3 months ago, but did not have a protracted recovery and has not had any cardiovascular symptoms since.  She did have a slight discomfort in her chest a few months ago and this will occur every once in a while, but is nothing she is particularly concerned about.  Requires a refill of Eliquis today.  Otherwise tolerating medical therapy well.  Our visit was originally scheduled for preoperative risk assessment, but patient canceled her right shoulder surgery due to feeling better.   Past Medical History:  Diagnosis Date   Diabetes mellitus without complication (HCC)    Hyperlipidemia    Hypertension    Mitral regurgitation    evaluated by TEE 09/2019   Persistent atrial fibrillation (HCC)     Past Surgical History:  Procedure Laterality Date   ATRIAL FIBRILLATION ABLATION N/A 12/02/2019   Procedure: ATRIAL FIBRILLATION ABLATION;  Surgeon: Hillis Range, MD;  Location: [facility] INVASIVE CV LAB;  Service: Cardiovascular;  Laterality: N/A;   BUBBLE STUDY  10/08/2019   Procedure: BUBBLE STUDY;  Surgeon: [name], MD;  Location: [facility] ENDOSCOPY;  Service: Cardiology;;    CARDIOVERSION N/A 07/08/2019   Procedure: CARDIOVERSION;  Surgeon: Yates Decamp, MD;  Location: [facility] ENDOSCOPY;  Service: Cardiovascular;  Laterality: N/A;   CARDIOVERSION N/A 09/02/2019   Procedure: CARDIOVERSION;  Surgeon: Yates Decamp, MD;  Location: [facility] ENDOSCOPY;  Service: Cardiovascular;  Laterality: N/A;   CARDIOVERSION N/A 01/13/2020   Procedure: CARDIOVERSION;  Surgeon: Sande Rives, MD;  Location: [facility] ENDOSCOPY;  Service: Cardiovascular;  Laterality: N/A;   CATARACT EXTRACTION, BILATERAL     PARTIAL HYSTERECTOMY     1977   TEE WITHOUT CARDIOVERSION N/A 10/08/2019   Procedure: TRANSESOPHAGEAL ECHOCARDIOGRAM (TEE);  Surgeon: [name], MD;  Location: [facility] ENDOSCOPY;  Service: Cardiology;  Laterality: N/A;    Current Medications: Current Meds  Medication Sig   acetaminophen (TYLENOL) 500 MG tablet Take 500 mg by mouth every 8 (eight) hours as needed for moderate pain or headache.    apixaban (ELIQUIS) 5 MG TABS tablet Take 1 tablet (5 mg total) by mouth 2 (two) times daily.   apixaban (ELIQUIS) 5 MG TABS tablet Take 1 tablet (5 mg total) by mouth 2 (two) times daily.   carvedilol (COREG) 6.25 MG tablet Take 6.25 mg by mouth 2 (two) times daily.   Cholecalciferol (VITAMIN D-3) 125 MCG (5000 UT) TABS Take 5,000 Units by mouth daily with lunch.    Cyanocobalamin (B-12) 2500 MCG TABS Take 2,500 mcg by mouth daily with lunch.    ezetimibe (ZETIA) 10 MG tablet Take 10 mg by  mouth daily.   furosemide (LASIX) 20 MG tablet Take 2 tablets (40 mg total) by mouth daily in the mornings and 1 tablet (20 mg total) by mouth daily in the evenings.   glimepiride (AMARYL) 4 MG tablet Take 1 tablet (4 mg total) by mouth daily with breakfast.   JARDIANCE 10 MG TABS tablet Take 10 mg by mouth daily.   LORazepam (ATIVAN) 0.5 MG tablet Take 0.5 mg by mouth 4 (four) times daily as needed.   losartan (COZAAR) 100 MG tablet Take 100 mg by mouth daily.    metFORMIN (GLUCOPHAGE) 1000 MG tablet Take 1,000  mg by mouth 2 (two) times daily with a meal.    rosuvastatin (CRESTOR) 20 MG tablet Take 20 mg by mouth daily.     Allergies:   Penicillins, Tramadol, and Codeine   Social History   Tobacco Use   Smoking status: Former    Current packs/day: 0.00    Average packs/day: 0.5 packs/day for 15.0 years (7.5 ttl pk-yrs)    Types: Cigarettes    Start date: 05/14/1970    Quit date: 05/14/1985    Years since quitting: 37.9   Smokeless tobacco: Never  Vaping Use   Vaping status: Never Used  Substance Use Topics   Alcohol use: No    Alcohol/week: 0.0 standard drinks of alcohol   Drug use: Never     Family History: The patient's family history includes Breast cancer in her daughter and daughter; Diabetes in her brother and mother; Emphysema in her brother and father; Heart disease in her brother, brother, and mother.  ROS:   Please see the history of present illness.     All other systems reviewed and are negative.  EKGs/Labs/Other Studies Reviewed:    The following studies were reviewed today:  Echo 10/21/2021: IMPRESSIONS   1. Left ventricular ejection fraction by 3D volume is 56 %. The left  ventricle has normal function. The left ventricle has no regional wall  motion abnormalities. Left ventricular diastolic parameters are  indeterminate.   2. Right ventricular systolic function is normal. The right ventricular  size is normal. There is normal pulmonary artery systolic pressure.   3. The mitral valve is normal in structure. Trivial mitral valve  regurgitation. No evidence of mitral stenosis. Moderate mitral annular  calcification.   4. The aortic valve is normal in structure. Aortic valve regurgitation is  not visualized. Aortic valve sclerosis/calcification is present, without  any evidence of aortic stenosis.   5. The inferior vena cava is normal in size with greater than 50%  respiratory variability, suggesting right atrial pressure of 3 mmHg.    EKG:  EKG is personally  reviewed.      09/06/2022: Sinus rhythm. Rate 65 bpm.  12/07/2020: Sinus rhythm. Rate 72 bpm.  Recent Labs: 09/06/2022: Hemoglobin 11.7; Platelets 307 09/13/2022: BUN 29; Creatinine, Ser 1.55; Potassium 4.6; Sodium 138  Recent Lipid Panel No results found for: "CHOL", "TRIG", "HDL", "CHOLHDL", "VLDL", "LDLCALC", "LDLDIRECT"  Physical Exam:    VS:  BP (!) 144/80   Pulse 81   Ht 4\' 11"  (1.499 m)   Wt 136 lb (61.7 kg)   SpO2 99%   BMI 27.47 kg/m     Wt Readings from Last 5 Encounters:  04/20/23 136 lb (61.7 kg)  02/14/23 139 lb 12.8 oz (63.4 kg)  09/06/22 139 lb 6.4 oz (63.2 kg)  12/19/21 139 lb 6.4 oz (63.2 kg)  08/30/21 137 lb 12.8 oz (62.5 kg)  Constitutional: No acute distress Eyes: sclera non-icteric, normal conjunctiva and lids ENMT: normal dentition, moist mucous membranes Cardiovascular: irregular rhythm, normal rate, no murmurs. S1 and S2 normal. No jugular venous distention.  Respiratory: clear to auscultation bilaterally GI : normal bowel sounds, soft and nontender.  Nondistended MSK: extremities warm, well perfused. No edema.  NEURO: grossly nonfocal exam, moves all extremities. PSYCH: alert and oriented x 3, normal mood and affect.   ASSESSMENT:    1. Paroxysmal atrial fibrillation (HCC)      PLAN:    Persistent atrial fibrillation (HCC) status post ablation Secondary hypercoagulable state (HCC) Medication management. -EKG demonstrates atrial fibrillation today, suspect the nidus for recurrent A-fib was COVID-19 infection 3 months ago.  Patient is asymptomatic overall with no signs of heart failure and no symptomatic palpitations.  On eliquis and carvedilol, continue.  I will see her back in approximately 2 months, if still in atrial fibrillation, will consider cardioversion at that time.   -Amiodarone discontinued due to medication intolerance with nausea and vomiting, this improved with cessation.    Essential hypertension  -Stable on home readings,  mildly elevated today, continue current therapy with carvedilol and losartan.  Mixed hyperlipidemia -Labs significantly elevated on last check last year, on statin and Zetia. Continue to follow with PCP as I do not have most recent labwork.   Mitral valve regurgitation -Appears no more than mild on most recent echocardiogram, continue to observe.  Chronic diastolic heart failure Chest pressure - feeling well without CP or HF sx.  - continue lasix 20 mg daily   Total time of encounter: 30 minutes total time of encounter, including 20 minutes spent in face-to-face patient care on the date of this encounter. This time includes coordination of care and counseling regarding above mentioned problem list. Remainder of non-face-to-face time involved reviewing chart documents/testing relevant to the patient encounter and documentation in the medical record. I have independently reviewed documentation from referring provider.   Weston Brass, MD, [facility]  [facility]    Medication Adjustments/Labs and Tests Ordered: Current medicines are reviewed at length with the patient today.  Concerns regarding medicines are outlined above.   Orders Placed This Encounter  Procedures   EKG 12-Lead   No orders of the defined types were placed in this encounter.  There are no Patient Instructions on file for this visit.

## 2023-04-21 LAB — BASIC METABOLIC PANEL
BUN/Creatinine Ratio: 23 (ref 12–28)
BUN: 34 mg/dL — ABNORMAL HIGH (ref 8–27)
CO2: 26 mmol/L (ref 20–29)
Calcium: 10.4 mg/dL — ABNORMAL HIGH (ref 8.7–10.3)
Chloride: 99 mmol/L (ref 96–106)
Creatinine, Ser: 1.51 mg/dL — ABNORMAL HIGH (ref 0.57–1.00)
Glucose: 113 mg/dL — ABNORMAL HIGH (ref 70–99)
Potassium: 5.3 mmol/L — ABNORMAL HIGH (ref 3.5–5.2)
Sodium: 139 mmol/L (ref 134–144)
eGFR: 35 mL/min/{1.73_m2} — ABNORMAL LOW (ref >59)

## 2023-04-21 LAB — CBC
Hematocrit: 45.7 % (ref 34.0–46.6)
Hemoglobin: 14.1 g/dL (ref 11.1–15.9)
MCH: 28.4 pg (ref 26.6–33.0)
MCHC: 30.9 g/dL — ABNORMAL LOW (ref 31.5–35.7)
MCV: 92 fL (ref 79–97)
Platelets: 264 10*3/uL (ref 150–450)
RBC: 4.96 x10E6/uL (ref 3.77–5.28)
RDW: 13.2 % (ref 11.7–15.4)
WBC: 8.7 10*3/uL (ref 3.4–10.8)

## 2023-04-30 ENCOUNTER — Encounter (INDEPENDENT_AMBULATORY_CARE_PROVIDER_SITE_OTHER): Payer: Medicare Other | Admitting: Ophthalmology

## 2023-04-30 DIAGNOSIS — H43813 Vitreous degeneration, bilateral: Secondary | ICD-10-CM

## 2023-04-30 DIAGNOSIS — H338 Other retinal detachments: Secondary | ICD-10-CM

## 2023-04-30 DIAGNOSIS — I10 Essential (primary) hypertension: Secondary | ICD-10-CM

## 2023-04-30 DIAGNOSIS — Z7984 Long term (current) use of oral hypoglycemic drugs: Secondary | ICD-10-CM

## 2023-04-30 DIAGNOSIS — H35033 Hypertensive retinopathy, bilateral: Secondary | ICD-10-CM

## 2023-04-30 DIAGNOSIS — E113312 Type 2 diabetes mellitus with moderate nonproliferative diabetic retinopathy with macular edema, left eye: Secondary | ICD-10-CM | POA: Diagnosis not present

## 2023-04-30 DIAGNOSIS — E113291 Type 2 diabetes mellitus with mild nonproliferative diabetic retinopathy without macular edema, right eye: Secondary | ICD-10-CM | POA: Diagnosis not present

## 2023-05-15 NOTE — Progress Notes (Signed)
Spoke to patient on phone regarding procedure tomorrow.  Instructed to arrive at 0815 am, NPO after midnight.  Confirmed patient has a ride home and responsible person to stay with patient for 24 hours after the procedure  Confirmed no missed doses of Eliquis, instructed patient to take the morning of surgery with a sip of water.  Take BP meds in the AM with a sip of water

## 2023-05-15 NOTE — Anesthesia Preprocedure Evaluation (Signed)
Anesthesia Evaluation  Patient identified by MRN, date of birth, ID band Patient awake    Reviewed: Allergy & Precautions, NPO status , Patient's Chart, lab work & pertinent test results, reviewed documented beta blocker date and time   History of Anesthesia Complications Negative for: history of anesthetic complications  Airway Mallampati: I  TM Distance: >3 FB Neck ROM: Full    Dental  (+) Upper Dentures, Lower Dentures, Edentulous Upper, Edentulous Lower   Pulmonary COPD, former smoker   breath sounds clear to auscultation       Cardiovascular hypertension, Pt. on medications and Pt. on home beta blockers (-) angina + dysrhythmias Atrial Fibrillation  Rhythm:Irregular Rate:Normal  '23 ECHO: EF 56%, normal LVF, normal RVF, no significant valvular abnormalities '20 stress: normal   Neuro/Psych negative neurological ROS     GI/Hepatic negative GI ROS, Neg liver ROS,,,  Endo/Other  diabetes (glu 177), Oral Hypoglycemic Agents    Renal/GU negative Renal ROS     Musculoskeletal   Abdominal   Peds  Hematology eliquis   Anesthesia Other Findings   Reproductive/Obstetrics                             Anesthesia Physical Anesthesia Plan  ASA: 3  Anesthesia Plan: General   Post-op Pain Management: Minimal or no pain anticipated   Induction: Intravenous  PONV Risk Score and Plan: 3 and Treatment may vary due to age or medical condition  Airway Management Planned: Natural Airway and Nasal Cannula  Additional Equipment: None  Intra-op Plan:   Post-operative Plan:   Informed Consent: I have reviewed the patients History and Physical, chart, labs and discussed the procedure including the risks, benefits and alternatives for the proposed anesthesia with the patient or authorized representative who has indicated his/her understanding and acceptance.       Plan Discussed with: CRNA and  Surgeon  Anesthesia Plan Comments:         Anesthesia Quick Evaluation

## 2023-05-16 ENCOUNTER — Ambulatory Visit (HOSPITAL_COMMUNITY): Payer: Medicare Other | Admitting: Anesthesiology

## 2023-05-16 ENCOUNTER — Other Ambulatory Visit: Payer: Self-pay

## 2023-05-16 ENCOUNTER — Ambulatory Visit (HOSPITAL_COMMUNITY)
Admission: RE | Admit: 2023-05-16 | Discharge: 2023-05-16 | Disposition: A | Discharge status: Home / Self Care | Payer: Medicare Other | Attending: Internal Medicine | Admitting: Internal Medicine

## 2023-05-16 ENCOUNTER — Encounter (HOSPITAL_COMMUNITY)
Admission: RE | Disposition: A | Discharge status: Home / Self Care | Payer: Self-pay | Source: Home / Self Care | Attending: Internal Medicine

## 2023-05-16 DIAGNOSIS — I4819 Other persistent atrial fibrillation: Secondary | ICD-10-CM | POA: Insufficient documentation

## 2023-05-16 DIAGNOSIS — Z7901 Long term (current) use of anticoagulants: Secondary | ICD-10-CM | POA: Insufficient documentation

## 2023-05-16 DIAGNOSIS — R0789 Other chest pain: Secondary | ICD-10-CM | POA: Diagnosis not present

## 2023-05-16 DIAGNOSIS — I5031 Acute diastolic (congestive) heart failure: Secondary | ICD-10-CM | POA: Diagnosis not present

## 2023-05-16 DIAGNOSIS — I110 Hypertensive heart disease with heart failure: Secondary | ICD-10-CM | POA: Insufficient documentation

## 2023-05-16 DIAGNOSIS — D6869 Other thrombophilia: Secondary | ICD-10-CM | POA: Insufficient documentation

## 2023-05-16 DIAGNOSIS — Z79899 Other long term (current) drug therapy: Secondary | ICD-10-CM | POA: Insufficient documentation

## 2023-05-16 DIAGNOSIS — E119 Type 2 diabetes mellitus without complications: Secondary | ICD-10-CM | POA: Insufficient documentation

## 2023-05-16 DIAGNOSIS — Z7984 Long term (current) use of oral hypoglycemic drugs: Secondary | ICD-10-CM | POA: Insufficient documentation

## 2023-05-16 DIAGNOSIS — I080 Rheumatic disorders of both mitral and aortic valves: Secondary | ICD-10-CM | POA: Diagnosis not present

## 2023-05-16 DIAGNOSIS — Z87891 Personal history of nicotine dependence: Secondary | ICD-10-CM | POA: Diagnosis not present

## 2023-05-16 DIAGNOSIS — I5032 Chronic diastolic (congestive) heart failure: Secondary | ICD-10-CM | POA: Insufficient documentation

## 2023-05-16 DIAGNOSIS — I4891 Unspecified atrial fibrillation: Secondary | ICD-10-CM

## 2023-05-16 DIAGNOSIS — E782 Mixed hyperlipidemia: Secondary | ICD-10-CM | POA: Diagnosis not present

## 2023-05-16 HISTORY — PX: CARDIOVERSION: SHX1299

## 2023-05-16 LAB — GLUCOSE, CAPILLARY: Glucose-Capillary: 177 mg/dL — ABNORMAL HIGH (ref 70–99)

## 2023-05-16 SURGERY — CARDIOVERSION
Anesthesia: General

## 2023-05-16 MED ORDER — PROPOFOL 10 MG/ML IV BOLUS
INTRAVENOUS | Status: DC | PRN
Start: 2023-05-16 — End: 2023-05-16
  Administered 2023-05-16: 50 mg via INTRAVENOUS

## 2023-05-16 MED ORDER — LIDOCAINE 2% (20 MG/ML) 5 ML SYRINGE
INTRAMUSCULAR | Status: DC | PRN
  Administered 2023-05-16: 4 mg via INTRAVENOUS

## 2023-05-16 MED ORDER — SODIUM CHLORIDE 0.9 % IV SOLN
INTRAVENOUS | Status: DC
  Administered 2023-05-16: 20 mL/h via INTRAVENOUS

## 2023-05-16 SURGICAL SUPPLY — 1 items: ELECT DEFIB PAD ADLT CADENCE (PAD) ×1 IMPLANT

## 2023-05-16 NOTE — Interval H&P Note (Signed)
History and Physical Interval Note:  05/16/2023 9:17 AM  Virginia Gilbert  has presented today for surgery, with the diagnosis of AFIB.  The various methods of treatment have been discussed with the patient and family. After consideration of risks, benefits and other options for treatment, the patient has consented to  Procedure(s): CARDIOVERSION (N/A) as a surgical intervention.  The patient's history has been reviewed, patient examined, no change in status, stable for surgery.  I have reviewed the patient's chart and labs.  Questions were answered to the patient's satisfaction.     [name]

## 2023-05-16 NOTE — Anesthesia Postprocedure Evaluation (Signed)
Anesthesia Post Note  Patient: Virginia Gilbert  Procedure(s) Performed: CARDIOVERSION     Patient location during evaluation: Endoscopy Anesthesia Type: General Level of consciousness: awake and alert, patient cooperative and oriented Pain management: pain level controlled Vital Signs Assessment: post-procedure vital signs reviewed and stable Respiratory status: nonlabored ventilation, spontaneous breathing and respiratory function stable Cardiovascular status: blood pressure returned to baseline and stable Postop Assessment: no apparent nausea or vomiting and able to ambulate Anesthetic complications: no   No notable events documented.  Last Vitals:  Vitals:   05/16/23 1005 05/16/23 1010  BP: 120/78 134/81  Pulse: (!) 105 (!) 104  Resp: 17 (!) 21  Temp:  (!) 35.7 C  SpO2: 96% 96%    Last Pain:  Vitals:   05/16/23 1010  TempSrc: Temporal  PainSc: 0-No pain                 Genevie Elman,E. Lovena Kluck

## 2023-05-16 NOTE — Discharge Instructions (Signed)
 Electrical Cardioversion Electrical cardioversion is the delivery of a jolt of electricity to restore a normal rhythm to the heart. A rhythm that is too fast or is not regular (arrhythmia) keeps the heart from pumping blood well. There is also another type of cardioversion called a chemical (pharmacologic) cardioversion. This is when your health care provider gives you one or more medicines to bring back your regular heart rhythm. Electrical cardioversion is done as a scheduled procedure for arrhythmiasthat are not life-threatening. Electrical cardioversion may also be done in an emergency for sudden life-threatening arrhythmias. Tell a health care provider about: Any allergies you have. All medicines you are taking, including vitamins, herbs, eye drops, creams, and over-the-counter medicines. Any problems you or family members have had with sedatives or anesthesia. Any bleeding problems you have. Any surgeries you have had, including a pacemaker, defibrillator, or other implanted device. Any medical conditions you have. Whether you are pregnant or may be pregnant. What are the risks? Your provider will talk with you about risks. These include: Allergic reactions to medicines. Irritation to the skin on your chest or back where the sticky pads (electrodes) or paddles were put during electrical cardioversion. A blood clot that breaks free and travels to other parts of your body, such as your brain. Return of a worse abnormal heart rhythm that will need to be treated with medicines, a pacemaker, or an implantable cardioverter defibrillator (ICD). What happens before the procedure? Medicines Your provider may give you: Blood-thinning medicines (anticoagulants) so your blood does not clot as easily. If your provider gives you this medicine, you may need to take it for 4 weeks before the procedure. Medicines to help stabilize your heart rate and rhythm. Ask your provider about: Changing or stopping  your regular medicines. These include any diabetes medicines or blood thinners you take. Taking medicines such as aspirin and ibuprofen. These medicines can thin your blood. Do not take them unless your provider tells you to. Taking over-the-counter medicines, vitamins, herbs, and supplements. General instructions Follow instructions from your provider about what you may eat and drink. Do not put any lotions, powders, or ointments on your chest and back for 24 hours before the procedure. They can cause problems with the electrodes or paddles used to deliver electricity to your heart. Do not wear jewelry as this can interfere with delivering electricity to your heart. If you will be going home right after the procedure, plan to have a responsible adult: Take you home from the hospital or clinic. You will not be allowed to drive. Care for you for the time you are told. Tests You may have an exam or testing. This may include: Blood labs. A transesophageal echocardiogram (TEE). What happens during the procedure?     An IV will be inserted into one of your veins. You will be given a sedative. This helps you relax. Electrodes or metal paddles will be placed on your chest. They may be placed in one of these ways: One placed on your right chest, the other on the left ribs. One placed on your chest and the other on your back. An electrical shock will be delivered. The shock briefly stops (resets) your heart rhythm. Your provider will check to see if your heart rhythm is now normal. Some people need only one shock. Some need more to restore a normal heart rhythm. The procedure may vary among providers and hospitals. What happens after the procedure? Your blood pressure, heart rate, breathing rate, and blood oxygen  level will be monitored until you leave the hospital or clinic. Your heart rhythm will be watched to make sure it does not change. This information is not intended to replace advice  given to you by your health care provider. Make sure you discuss any questions you have with your health care provider. Document Revised: 03/23/2022 Document Reviewed: 03/23/2022 Elsevier Patient Education  2024 [employer].

## 2023-05-16 NOTE — Transfer of Care (Signed)
Immediate Anesthesia Transfer of Care Note  Patient: Virginia Gilbert  Procedure(s) Performed: CARDIOVERSION  Patient Location: Cath Lab  Anesthesia Type:General  Level of Consciousness: drowsy and responds to stimulation  Airway & Oxygen Therapy: Patient Spontanous Breathing and Patient connected to nasal cannula oxygen  Post-op Assessment: Report given to RN and Post -op Vital signs reviewed and stable  Post vital signs: Reviewed and stable  Last Vitals:  Vitals Value Taken Time  BP 115/69 05/16/2023 09:50  Temp    Pulse 105 05/16/2023 09:50    Resp 17 05/16/2023 09:50    SpO2 95% 05/16/2023 09:50        Complications: No notable events documented.

## 2023-05-16 NOTE — CV Procedure (Signed)
Procedure: Electrical Cardioversion Indications:  Atrial Fibrillation  Procedure Details:  Consent: Risks of procedure as well as the alternatives and risks of each were explained to the (patient/caregiver).  Consent for procedure obtained.  Time Out: Verified patient identification, verified procedure, site/side was marked, verified correct patient position, special equipment/implants available, medications/allergies/relevent history reviewed, required imaging and test results available. PERFORMED.  Patient placed on cardiac monitor, pulse oximetry, supplemental oxygen as necessary.  Sedation given:  propofol per anesthesia Pacer pads placed anterior chest.  Cardioverted 1 time(s).  Cardioversion with synchronized biphasic 200J shock.  Evaluation: Findings: Post procedure EKG shows: NSR Complications: None Patient did tolerate procedure well.  Time Spent Directly with the Patient:  15 minutes   [name] 05/16/2023, 9:45 AM

## 2023-05-17 ENCOUNTER — Encounter (HOSPITAL_COMMUNITY): Payer: Self-pay | Admitting: Internal Medicine

## 2023-05-21 DIAGNOSIS — E118 Type 2 diabetes mellitus with unspecified complications: Secondary | ICD-10-CM | POA: Diagnosis not present

## 2023-05-21 DIAGNOSIS — N1832 Chronic kidney disease, stage 3b: Secondary | ICD-10-CM | POA: Diagnosis not present

## 2023-05-21 DIAGNOSIS — I10 Essential (primary) hypertension: Secondary | ICD-10-CM | POA: Diagnosis not present

## 2023-05-21 DIAGNOSIS — E7800 Pure hypercholesterolemia, unspecified: Secondary | ICD-10-CM | POA: Diagnosis not present

## 2023-05-22 LAB — LAB REPORT - SCANNED
A1c: 8
Albumin, Urine POC: 63.8
Creatinine, POC: 73.5 mg/dL
EGFR: 36
Microalb Creat Ratio: 87

## 2023-05-28 DIAGNOSIS — M7522 Bicipital tendinitis, left shoulder: Secondary | ICD-10-CM | POA: Diagnosis not present

## 2023-05-28 DIAGNOSIS — Z23 Encounter for immunization: Secondary | ICD-10-CM | POA: Diagnosis not present

## 2023-05-28 DIAGNOSIS — I10 Essential (primary) hypertension: Secondary | ICD-10-CM | POA: Diagnosis not present

## 2023-05-28 DIAGNOSIS — E118 Type 2 diabetes mellitus with unspecified complications: Secondary | ICD-10-CM | POA: Diagnosis not present

## 2023-05-28 DIAGNOSIS — E7800 Pure hypercholesterolemia, unspecified: Secondary | ICD-10-CM | POA: Diagnosis not present

## 2023-05-28 DIAGNOSIS — N1832 Chronic kidney disease, stage 3b: Secondary | ICD-10-CM | POA: Diagnosis not present

## 2023-05-28 DIAGNOSIS — K2100 Gastro-esophageal reflux disease with esophagitis, without bleeding: Secondary | ICD-10-CM | POA: Diagnosis not present

## 2023-05-28 DIAGNOSIS — I5032 Chronic diastolic (congestive) heart failure: Secondary | ICD-10-CM | POA: Diagnosis not present

## 2023-05-28 DIAGNOSIS — N390 Urinary tract infection, site not specified: Secondary | ICD-10-CM | POA: Diagnosis not present

## 2023-05-28 DIAGNOSIS — I4819 Other persistent atrial fibrillation: Secondary | ICD-10-CM | POA: Diagnosis not present

## 2023-05-29 ENCOUNTER — Encounter (INDEPENDENT_AMBULATORY_CARE_PROVIDER_SITE_OTHER): Payer: Medicare Other | Admitting: Ophthalmology

## 2023-05-29 ENCOUNTER — Telehealth: Payer: Self-pay | Admitting: Internal Medicine

## 2023-05-29 DIAGNOSIS — Z7984 Long term (current) use of oral hypoglycemic drugs: Secondary | ICD-10-CM | POA: Diagnosis not present

## 2023-05-29 DIAGNOSIS — H338 Other retinal detachments: Secondary | ICD-10-CM | POA: Diagnosis not present

## 2023-05-29 DIAGNOSIS — E113312 Type 2 diabetes mellitus with moderate nonproliferative diabetic retinopathy with macular edema, left eye: Secondary | ICD-10-CM

## 2023-05-29 DIAGNOSIS — I10 Essential (primary) hypertension: Secondary | ICD-10-CM

## 2023-05-29 DIAGNOSIS — E113391 Type 2 diabetes mellitus with moderate nonproliferative diabetic retinopathy without macular edema, right eye: Secondary | ICD-10-CM | POA: Diagnosis not present

## 2023-05-29 DIAGNOSIS — H43813 Vitreous degeneration, bilateral: Secondary | ICD-10-CM | POA: Diagnosis not present

## 2023-05-29 DIAGNOSIS — H35033 Hypertensive retinopathy, bilateral: Secondary | ICD-10-CM | POA: Diagnosis not present

## 2023-05-29 NOTE — Telephone Encounter (Signed)
Patient c/o Palpitations:  High priority if patient c/o lightheadedness, shortness of breath, or chest pain  How long have you had palpitations/irregular HR/ Afib? Are you having the symptoms now?  Patient states yesterday when she saw her PCP she was advised that she is in afib - she states she has been feeling bad for a while though. She states she has been very tired.  Are you currently experiencing lightheadedness, SOB or CP?  No   Do you have a history of afib (atrial fibrillation) or irregular heart rhythm?  Yes   Have you checked your BP or HR? (document readings if available):  145/90  Are you experiencing any other symptoms?  "Just tired and don't feel good"

## 2023-05-29 NOTE — Telephone Encounter (Signed)
Spoke to patient she stated she feels bad,no energy.She feels like she did before cardioversion.No fast heart beat.Appointment scheduled with Dr.Achyarya 10/18 at 11:30 am.

## 2023-06-01 ENCOUNTER — Ambulatory Visit: Payer: Medicare Other | Admitting: Internal Medicine

## 2023-06-01 ENCOUNTER — Encounter: Payer: Self-pay | Admitting: Internal Medicine

## 2023-06-01 ENCOUNTER — Ambulatory Visit: Payer: Medicare Other | Attending: Internal Medicine | Admitting: Internal Medicine

## 2023-06-01 VITALS — BP 106/50 | HR 90 | Ht 59.0 in | Wt 140.0 lb

## 2023-06-01 DIAGNOSIS — I4819 Other persistent atrial fibrillation: Secondary | ICD-10-CM

## 2023-06-01 DIAGNOSIS — I5032 Chronic diastolic (congestive) heart failure: Secondary | ICD-10-CM | POA: Diagnosis not present

## 2023-06-01 DIAGNOSIS — Z79899 Other long term (current) drug therapy: Secondary | ICD-10-CM | POA: Diagnosis not present

## 2023-06-01 DIAGNOSIS — E782 Mixed hyperlipidemia: Secondary | ICD-10-CM

## 2023-06-01 DIAGNOSIS — D6869 Other thrombophilia: Secondary | ICD-10-CM | POA: Diagnosis not present

## 2023-06-01 DIAGNOSIS — I10 Essential (primary) hypertension: Secondary | ICD-10-CM | POA: Diagnosis not present

## 2023-06-01 DIAGNOSIS — I340 Nonrheumatic mitral (valve) insufficiency: Secondary | ICD-10-CM

## 2023-06-01 NOTE — Progress Notes (Signed)
Cardiology Office Note:  .   Date:  06/01/2023  ID:  Virginia Gilbert, DOB [birth year], MRN [id number] PCP: Irena Reichmann, DO  [facility] Providers Cardiologist:  [name], MD Electrophysiologist:  Hillis Range, MD (Inactive)    History of Present Illness: .   Virginia Gilbert is a 82 y.o. female.  Discussed the use of AI scribe software for clinical note transcription with the patient, who gave verbal consent to proceed.  History of Present Illness   The patient, with a history of atrial fibrillation (AFib) and COVID-19, presents with a feeling of weakness and tiredness, which has been ongoing for over a week. The patient reports feeling worse than when she was in AFib a few months ago. She denies any dizziness, palpitations, or fainting spells. She has been coughing, particularly at night, but denies any productive cough. She also denies any symptoms of fluid overload such as swelling in the ankles. The patient's blood pressure has been low recently, with a reading of 96/58 the day before the visit. She has been taking Lasix twice daily, which was increased after her COVID-19 infection. The patient also has a history of diabetes, with a recent increase in her A1C from 6.8 to 8. She denies any symptoms of hyperglycemia.        ROS: negative except per HPI above.  Studies Reviewed: Marland Kitchen   EKG Interpretation Date/Time:  Friday June 01 2023 11:03:38 EDT Ventricular Rate:  90 PR Interval:    QRS Duration:  122 QT Interval:  384 QTC Calculation: 469 R Axis:   79  Text Interpretation: Atrial fibrillation Right bundle branch block T wave abnormality, consider inferolateral ischemia When compared with ECG of 16-May-2023 09:47, Atrial fibrillation has replaced Sinus rhythm Confirmed by Weston Brass ([id number]) on 06/01/2023 11:14:51 AM    Results   LABS A1c: 8 (05/28/2023)  DIAGNOSTIC EKG: Atrial fibrillation, heart rate within normal limits (06/01/2023)     Risk  Assessment/Calculations:    CHA2DS2-VASc Score = 6   This indicates a 9.7% annual risk of stroke. The patient's score is based upon: CHF History: 1 HTN History: 1 Diabetes History: 1 Stroke History: 0 Vascular Disease History: 0 Age Score: 2 Gender Score: 1      Physical Exam:   VS:  BP (!) 106/50   Pulse 90   Ht 4\' 11"  (1.499 m)   Wt 140 lb (63.5 kg)   SpO2 99%   BMI 28.28 kg/m    Wt Readings from Last 3 Encounters:  06/01/23 140 lb (63.5 kg)  05/16/23 137 lb (62.1 kg)  04/20/23 136 lb (61.7 kg)     Physical Exam  VITALS: BP- 96/58 MEASUREMENTS: WT- 140 CHEST: Lungs clear to auscultation. CARDIOVASCULAR: no murmurs    GEN: Well nourished, well developed in no acute distress NECK: No JVD; No carotid bruits CARDIAC: iRRR, no murmurs, rubs, gallops RESPIRATORY:  Clear to auscultation without rales, wheezing or rhonchi  ABDOMEN: Soft, non-tender, non-distended EXTREMITIES:  No edema; No deformity   ASSESSMENT AND PLAN: .    1. Primary hypertension   2. Persistent atrial fibrillation (HCC)   3. Secondary hypercoagulable state (HCC)   4. Mixed hyperlipidemia   5. Nonrheumatic mitral valve regurgitation   6. Chronic diastolic (congestive) heart failure (HCC)   7. Medication management     Assessment and Plan    Atrial Fibrillation Recurrent despite previous cardioversion and amiodarone therapy (discontinued due to side effects). Patient currently symptomatic with weakness and  fatigue, worse than previous episodes. No evidence of fluid overload or other triggers identified. -Refer to AFib clinic for further management options. -Consider repeat echocardiogram if symptoms persist or worsen, although no current signs of fluid overload.  Hypotension Recent low blood pressure readings at home and in clinic, potentially contributing to patient's weakness. No clear cause identified. -Reduce Losartan dose to 50mg  daily. -Instruct patient to monitor blood pressure daily  at home. -If blood pressure readings consistently >140/90, patient can resume full Losartan dose.  Hyperglycemia Recent increase in HbA1c to 8, indicating suboptimal glycemic control. -Continue current diabetes management and monitor.  Hyperlipidemia Recent increase in cholesterol, although not significantly elevated. -Continue current management and monitor.  Follow-up in 4-6 weeks to reassess symptoms, blood pressure, and response to AFib clinic interventions.                Total time of encounter: 25 minutes total time of encounter, including 15 minutes spent in face-to-face patient care on the date of this encounter. This time includes coordination of care and counseling regarding above mentioned problem list. Remainder of non-face-to-face time involved reviewing chart documents/testing relevant to the patient encounter and documentation in the medical record. I have independently reviewed documentation from referring provider.   Weston Brass, MD, [facility]  [facility]

## 2023-06-01 NOTE — Patient Instructions (Signed)
Medication Instructions:  Cut Losartan in half *If you need a refill on your cardiac medications before your next appointment, please call your pharmacy*  Follow-Up: At [facility], you and your health needs are our priority.  As part of our continuing mission to provide you with exceptional heart care, we have created designated Provider Care Teams.  These Care Teams include your primary Cardiologist (physician) and Advanced Practice Providers (APPs -  Physician Assistants and Nurse Practitioners) who all work together to provide you with the care you need, when you need it.  We recommend signing up for the patient portal called "MyChart".  Sign up information is provided on this After Visit Summary.  MyChart is used to connect with patients for Virtual Visits (Telemedicine).  Patients are able to view lab/test results, encounter notes, upcoming appointments, etc.  Non-urgent messages can be sent to your provider as well.   To learn more about what you can do with MyChart, go to [email or web address].    Your next appointment:   Needs first available Afib Clinic Appointment  4-6 weeks with Dr Jacques Navy or APP

## 2023-06-11 ENCOUNTER — Ambulatory Visit (HOSPITAL_COMMUNITY)
Admission: RE | Admit: 2023-06-11 | Discharge: 2023-06-11 | Disposition: A | Discharge status: Home / Self Care | Payer: Medicare Other | Source: Ambulatory Visit | Attending: Physician Assistant | Admitting: Physician Assistant

## 2023-06-11 ENCOUNTER — Encounter (HOSPITAL_COMMUNITY): Payer: Self-pay | Admitting: Physician Assistant

## 2023-06-11 VITALS — BP 158/100 | HR 86 | Ht 59.0 in | Wt 139.0 lb

## 2023-06-11 DIAGNOSIS — Z79899 Other long term (current) drug therapy: Secondary | ICD-10-CM | POA: Diagnosis not present

## 2023-06-11 DIAGNOSIS — I110 Hypertensive heart disease with heart failure: Secondary | ICD-10-CM | POA: Insufficient documentation

## 2023-06-11 DIAGNOSIS — I4892 Unspecified atrial flutter: Secondary | ICD-10-CM | POA: Insufficient documentation

## 2023-06-11 DIAGNOSIS — I5032 Chronic diastolic (congestive) heart failure: Secondary | ICD-10-CM | POA: Diagnosis not present

## 2023-06-11 DIAGNOSIS — D6869 Other thrombophilia: Secondary | ICD-10-CM

## 2023-06-11 DIAGNOSIS — Z7901 Long term (current) use of anticoagulants: Secondary | ICD-10-CM | POA: Insufficient documentation

## 2023-06-11 DIAGNOSIS — I4819 Other persistent atrial fibrillation: Secondary | ICD-10-CM | POA: Diagnosis not present

## 2023-06-11 LAB — TSH: TSH: 2.295 u[IU]/mL (ref 0.350–4.500)

## 2023-06-11 MED ORDER — AMIODARONE HCL 200 MG PO TABS: ORAL_TABLET | ORAL | 3 refills | Status: DC

## 2023-06-11 NOTE — Progress Notes (Addendum)
Primary Care Physician: Irena Reichmann, DO Primary Cardiologist: Dr Jacques Navy Primary Electrophysiologist: Dr Elberta Fortis  Referring Physician: Dr Albertina Parr Virginia Gilbert is a 82 y.o. female with a history of DM, HLD, HTN, and persistent atrial fibrillation who presents for follow up in the [facility]. The patient was initially diagnosed with atrial fibrillation 05/2019 after presenting with symptoms of worsening dyspnea on exertion. Patient is on Eliquis for a CHADS2VASC score of 5. There were no specific triggers that the patient could identify. She underwent DCCV on 07/08/19 but had ERAF. She was loaded on amiodarone and had DCCV again on 09/02/19 but unfortunately she was back in afib again. She denies snoring or alcohol use. Patient is s/p afib ablation with Dr Johney Frame on 12/02/19. She was back in afib on her follow up 01/05/20 and had another DCCV on 01/13/20. Her amiodarone was later discontinued.   She was seen by Dr Jacques Navy 04/20/23 and found to be in persistent afib. Thought to be precipitated by her COVID 19 infection a couple months previous. She underwent DCCV on 05/16/23 but was back out of rhythm on follow up 10/18.  On follow up today, patient remains in afib with symptoms of fatigue. No bleeding issues on anticoagulation.   Today, she denies symptoms of palpitations, chest pain, orthopnea, PND, dizziness, presyncope, syncope, snoring, daytime somnolence, bleeding, or neurologic sequela. The patient is tolerating medications without difficulties and is otherwise without complaint today.    Atrial Fibrillation Risk Factors:  she does not have symptoms or diagnosis of sleep apnea. she does not have a history of rheumatic fever. she does not have a history of alcohol use. The patient does not have a history of early familial atrial fibrillation or other arrhythmias.   Atrial Fibrillation Management history:  Previous antiarrhythmic drugs: amiodarone Previous  cardioversions: 07/08/19, 09/02/19, 05/16/23 Previous ablations: 12/02/19 Anticoagulation history: Eliquis   Past Medical History:  Diagnosis Date   Diabetes mellitus without complication (HCC)    Hyperlipidemia    Hypertension    Mitral regurgitation    evaluated by TEE 09/2019   Persistent atrial fibrillation (HCC)     Current Outpatient Medications  Medication Sig Dispense Refill   acetaminophen (TYLENOL) 500 MG tablet Take 1,000 mg by mouth every 8 (eight) hours as needed for moderate pain or headache.     apixaban (ELIQUIS) 5 MG TABS tablet Take 1 tablet (5 mg total) by mouth 2 (two) times daily. 180 tablet 1   carvedilol (COREG) 6.25 MG tablet Take 6.25 mg by mouth 2 (two) times daily.     Cholecalciferol (VITAMIN D-3) 125 MCG (5000 UT) TABS Take 5,000 Units by mouth daily with lunch.      ciprofloxacin (CILOXAN) 0.3 % ophthalmic solution Place 1 drop into the left eye See admin instructions. Instill 1 drop into the left eye 4 times daily for 2 days after each monthly eye injection     cyanocobalamin (VITAMIN B12) 1000 MCG tablet Take 1,000 mcg by mouth daily.     empagliflozin (JARDIANCE) 25 MG TABS tablet Take 25 mg by mouth daily.     ezetimibe (ZETIA) 10 MG tablet Take 10 mg by mouth daily.     furosemide (LASIX) 20 MG tablet Take 2 tablets (40 mg total) by mouth daily in the mornings and 1 tablet (20 mg total) by mouth daily in the evenings. (Patient taking differently: Take 20 mg by mouth daily.) 360 tablet 3   glimepiride (AMARYL) 4  MG tablet Take 1 tablet (4 mg total) by mouth daily with breakfast. (Patient taking differently: Take 4 mg by mouth 2 (two) times daily.)  1   LORazepam (ATIVAN) 0.5 MG tablet Take 0.5 mg by mouth daily as needed for anxiety.     losartan (COZAAR) 100 MG tablet Take 100 mg by mouth daily.  6   metFORMIN (GLUCOPHAGE) 1000 MG tablet Take 1,000 mg by mouth 2 (two) times daily with a meal.   6   pravastatin (PRAVACHOL) 10 MG tablet Take 10 mg by mouth  daily.     trolamine salicylate (ASPERCREME) 10 % cream Apply 1 Application topically as needed for muscle pain.     No current facility-administered medications for this encounter.    ROS- All systems are reviewed and negative except as per the HPI above.  Physical Exam: Vitals:   06/11/23 1452  BP: (!) 158/100  Pulse: 86  Weight: 63 kg  Height: 4\' 11"  (1.499 m)    GEN: Well nourished, well developed in no acute distress NECK: No JVD; No carotid bruits CARDIAC: Irregularly irregular rate and rhythm, no murmurs, rubs, gallops RESPIRATORY:  Clear to auscultation without rales, wheezing or rhonchi  ABDOMEN: Soft, non-tender, non-distended EXTREMITIES:  No edema; No deformity    Wt Readings from Last 3 Encounters:  06/11/23 63 kg  06/01/23 63.5 kg  05/16/23 62.1 kg    EKG today demonstrates  Afib, RBBB Vent. rate 86 BPM PR interval * ms QRS duration 116 ms QT/QTcB 372/445 ms  Echo 10/21/21 demonstrated   1. Left ventricular ejection fraction by 3D volume is 56 %. The left  ventricle has normal function. The left ventricle has no regional wall  motion abnormalities. Left ventricular diastolic parameters are  indeterminate.   2. Right ventricular systolic function is normal. The right ventricular  size is normal. There is normal pulmonary artery systolic pressure.   3. The mitral valve is normal in structure. Trivial mitral valve  regurgitation. No evidence of mitral stenosis. Moderate mitral annular  calcification.   4. The aortic valve is normal in structure. Aortic valve regurgitation is  not visualized. Aortic valve sclerosis/calcification is present, without  any evidence of aortic stenosis.   5. The inferior vena cava is normal in size with greater than 50%  respiratory variability, suggesting right atrial pressure of 3 mmHg.   Epic records are reviewed at length today  CHA2DS2-VASc Score = 6  The patient's score is based upon: CHF History: 1 HTN History:  1 Diabetes History: 1 Stroke History: 0 Vascular Disease History: 0 Age Score: 2 Gender Score: 1       ASSESSMENT AND PLAN: Persistent Atrial Fibrillation/atrial flutter The patient's CHA2DS2-VASc score is 6, indicating a 9.7% annual risk of stroke.   S/p ablation 12/02/19 S/p DCCV 05/16/23 with quick return of afib We discussed rhythm control options today including dofetilide, amiodarone, and repeat ablation. Long term, she would like to be considered for repeat ablation, will refer back to Dr Elberta Fortis. Short term, she is agreeable to retrial of amiodarone as a bridge to ablation. Will start amiodarone 200 mg BID x 4 weeks then decrease to once daily. Will repeat DCCV after loading if needed.  Continue Eliquis 5 mg BID Continue coreg 6.25 mg BID Recent cmet reviewed. Check TSH today.  Secondary Hypercoagulable State (ICD10:  D68.69) The patient is at significant risk for stroke/thromboembolism based upon her CHA2DS2-VASc Score of 6.  Continue Apixaban (Eliquis).   HTN Elevated today,  will reassess in SR  Chronic diastolic CHF Fluid status appears stable   Follow up with Marjie Skiff as scheduled. AF clinic in one month.    Jorja Loa PA-C Afib Clinic [facility] [address] [place], [place] [address] [phone] 06/11/2023 3:05 PM

## 2023-06-14 ENCOUNTER — Telehealth (HOSPITAL_COMMUNITY): Payer: Self-pay | Admitting: *Deleted

## 2023-06-14 MED ORDER — AMIODARONE HCL 200 MG PO TABS: 200.0000 mg | ORAL_TABLET | Freq: Every day | ORAL | 3 refills | Status: DC

## 2023-06-14 NOTE — Telephone Encounter (Signed)
Patient called in stating she is having nausea/vomiting on amiodarone. Pt will decrease amiodarone to once a day and see if this helps with nausea. She will call with update of response to reduction. Pt in agreement.

## 2023-06-18 NOTE — Telephone Encounter (Signed)
Nausea subsided with reduction of amiodarone. She is still very fatigued which is her primary complaint with afib. Pt encouraged to continue current regimen. HR in the 70-80s. Pt in agreement.

## 2023-06-26 ENCOUNTER — Encounter (INDEPENDENT_AMBULATORY_CARE_PROVIDER_SITE_OTHER): Payer: Medicare Other | Admitting: Ophthalmology

## 2023-06-26 DIAGNOSIS — H35033 Hypertensive retinopathy, bilateral: Secondary | ICD-10-CM | POA: Diagnosis not present

## 2023-06-26 DIAGNOSIS — H353112 Nonexudative age-related macular degeneration, right eye, intermediate dry stage: Secondary | ICD-10-CM

## 2023-06-26 DIAGNOSIS — H338 Other retinal detachments: Secondary | ICD-10-CM | POA: Diagnosis not present

## 2023-06-26 DIAGNOSIS — I10 Essential (primary) hypertension: Secondary | ICD-10-CM

## 2023-06-26 DIAGNOSIS — Z7984 Long term (current) use of oral hypoglycemic drugs: Secondary | ICD-10-CM

## 2023-06-26 DIAGNOSIS — H43813 Vitreous degeneration, bilateral: Secondary | ICD-10-CM

## 2023-06-26 DIAGNOSIS — E113291 Type 2 diabetes mellitus with mild nonproliferative diabetic retinopathy without macular edema, right eye: Secondary | ICD-10-CM

## 2023-06-26 DIAGNOSIS — E113312 Type 2 diabetes mellitus with moderate nonproliferative diabetic retinopathy with macular edema, left eye: Secondary | ICD-10-CM

## 2023-06-28 ENCOUNTER — Ambulatory Visit: Payer: Medicare Other | Admitting: Student

## 2023-06-28 ENCOUNTER — Ambulatory Visit: Payer: Medicare Other | Attending: Internal Medicine | Admitting: Internal Medicine

## 2023-06-28 VITALS — BP 128/70 | HR 81 | Ht 59.0 in | Wt 137.0 lb

## 2023-06-28 DIAGNOSIS — E782 Mixed hyperlipidemia: Secondary | ICD-10-CM | POA: Diagnosis not present

## 2023-06-28 DIAGNOSIS — I340 Nonrheumatic mitral (valve) insufficiency: Secondary | ICD-10-CM

## 2023-06-28 DIAGNOSIS — I5032 Chronic diastolic (congestive) heart failure: Secondary | ICD-10-CM | POA: Diagnosis not present

## 2023-06-28 DIAGNOSIS — I4819 Other persistent atrial fibrillation: Secondary | ICD-10-CM

## 2023-06-28 DIAGNOSIS — I10 Essential (primary) hypertension: Secondary | ICD-10-CM

## 2023-06-28 DIAGNOSIS — D6869 Other thrombophilia: Secondary | ICD-10-CM | POA: Diagnosis not present

## 2023-06-28 NOTE — Progress Notes (Signed)
Cardiology Office Note:  .   Date:  06/28/2023  ID:  Virginia Gilbert, DOB 14-Apr-1941, MRN [id number] PCP: Irena Reichmann, DO  [facility] Providers Cardiologist:  [name], MD Electrophysiologist:  Hillis Range, MD (Inactive)    History of Present Illness: .   Virginia Gilbert is a 82 y.o. female.  Discussed the use of AI scribe software for clinical note transcription with the patient, who gave verbal consent to proceed.  History of Present Illness   The patient, with a history of atrial fibrillation (AFib) and fluid retention, presents with fatigue and decreased energy. She reports that these symptoms have been particularly noticeable since her AFib returned. She describes feeling "wiped out" after a day of grocery shopping, and needing a full day to recover. She also notes that she was previously able to work in her yard without issue, but has been unable to do so since her AFib returned.  The patient was previously placed on amiodarone to manage her AFib, but had to discontinue the medication due to severe side effects, including vomiting. This is the second time she has been unable to tolerate amiodarone. She expresses a desire to avoid hospitalization if possible, and is hesitant about the prospect of taking another medication that may require hospitalization to initiate, however may be willing to consider to feel better.  The patient has been managing her fluid retention well, and has not had any recent episodes of fluid overload requiring hospitalization. She is currently taking Lasix twice daily, which appears to be effective in managing her fluid balance. She also reports that she has been able to maintain her daily activities, including shopping and housework, although she does feel fatigued afterwards.        ROS: negative except per HPI above.  Studies Reviewed: Marland Kitchen   EKG Interpretation Date/Time:  Thursday June 28 2023 12:59:26 EST Ventricular Rate:  79 PR  Interval:    QRS Duration:  124 QT Interval:  406 QTC Calculation: 465 R Axis:   40  Text Interpretation: Atrial fibrillation Right bundle branch block T wave abnormality, consider inferior ischemia When compared with ECG of 11-Jun-2023 14:54, Nonspecific T wave abnormality has replaced inverted T waves in Lateral leads Confirmed by Weston Brass ([id number]) on 06/28/2023 1:05:19 PM    Results         Risk Assessment/Calculations:    CHA2DS2-VASc Score = 6   This indicates a 9.7% annual risk of stroke. The patient's score is based upon: CHF History: 1 HTN History: 1 Diabetes History: 1 Stroke History: 0 Vascular Disease History: 0 Age Score: 2 Gender Score: 1      Physical Exam:   VS:  BP 128/70 (BP Location: Left Arm, Patient Position: Sitting, Cuff Size: Normal)   Pulse 81   Ht 4\' 11"  (1.499 m)   Wt 137 lb (62.1 kg)   SpO2 98%   BMI 27.67 kg/m    Wt Readings from Last 3 Encounters:  06/28/23 137 lb (62.1 kg)  06/11/23 139 lb (63 kg)  06/01/23 140 lb (63.5 kg)     Physical Exam   NECK: no jvd CHEST: lungs clear to auscultation CARDIOVASCULAR: heart rate irregular     GEN: Well nourished, well developed in no acute distress NECK: No JVD; No carotid bruits CARDIAC: iRRR, no murmurs, rubs, gallops RESPIRATORY:  Clear to auscultation without rales, wheezing or rhonchi  ABDOMEN: Soft, non-tender, non-distended EXTREMITIES:  No edema; No deformity   ASSESSMENT AND PLAN: .  1. Persistent atrial fibrillation (HCC)   2. Primary hypertension   3. Hypercoagulable state due to persistent atrial fibrillation (HCC)   4. Secondary hypercoagulable state (HCC)   5. Mixed hyperlipidemia   6. Nonrheumatic mitral valve regurgitation   7. Chronic diastolic (congestive) heart failure (HCC)     Assessment and Plan    Atrial Fibrillation Persistent despite previous ablation and intolerance to amiodarone. Patient experiences fatigue and decreased activity tolerance.  Discussed options including repeat ablation, medication trial with hospital admission, or remaining in AFib with anticoagulation. -Continue Eliquis for stroke prevention. -Consider trial of dofetilide with hospital admission for loading and monitoring. F/u with afib clinic as scheduled. -Discuss repeat ablation with Dr. Elberta Fortis at appointment on January 28th.  Volume Overload Previously complicated by AFib, currently well controlled with Lasix 20mg  BID. -Continue Lasix 20mg  BID. -Monitor for signs of fluid overload.  Follow-up in 6 months or sooner if needed.                Total time of encounter: 25 minutes total time of encounter, including 15 minutes spent in face-to-face patient care on the date of this encounter. This time includes coordination of care and counseling regarding above mentioned problem list. Remainder of non-face-to-face time involved reviewing chart documents/testing relevant to the patient encounter and documentation in the medical record. I have independently reviewed documentation from referring provider.   Weston Brass, MD, [facility]  [facility]

## 2023-06-28 NOTE — Patient Instructions (Signed)
Medication Instructions:  Your physician recommends that you continue on your current medications as directed. Please refer to the Current Medication list given to you today.  *If you need a refill on your cardiac medications before your next appointment, please call your pharmacy*  Lab Work: None  Testing/Procedures: None  Follow-Up: At [facility] you and your health needs are our priority.  As part of our continuing mission to provide you with exceptional heart care, we have created designated Provider Care Teams.  These Care Teams include your primary Cardiologist (physician) and Advanced Practice Providers (APPs -  Physician Assistants and Nurse Practitioners) who all work together to provide you with the care you need, when you need it.  Your next appointment:   6 month(s)  Provider:   [name], MD

## 2023-07-09 ENCOUNTER — Encounter (HOSPITAL_COMMUNITY): Payer: Self-pay | Admitting: Physician Assistant

## 2023-07-09 ENCOUNTER — Telehealth: Payer: Self-pay | Admitting: Pharmacist Clinician (PhC)/ Clinical Pharmacy Specialist

## 2023-07-09 ENCOUNTER — Ambulatory Visit (HOSPITAL_COMMUNITY)
Admission: RE | Admit: 2023-07-09 | Discharge: 2023-07-09 | Disposition: A | Discharge status: Home / Self Care | Payer: Medicare Other | Source: Ambulatory Visit | Attending: Physician Assistant | Admitting: Physician Assistant

## 2023-07-09 ENCOUNTER — Other Ambulatory Visit (HOSPITAL_COMMUNITY): Payer: Self-pay

## 2023-07-09 VITALS — BP 142/80 | HR 79 | Ht 59.0 in | Wt 139.2 lb

## 2023-07-09 DIAGNOSIS — Z79899 Other long term (current) drug therapy: Secondary | ICD-10-CM | POA: Insufficient documentation

## 2023-07-09 DIAGNOSIS — I5032 Chronic diastolic (congestive) heart failure: Secondary | ICD-10-CM | POA: Insufficient documentation

## 2023-07-09 DIAGNOSIS — D6869 Other thrombophilia: Secondary | ICD-10-CM | POA: Insufficient documentation

## 2023-07-09 DIAGNOSIS — I4892 Unspecified atrial flutter: Secondary | ICD-10-CM | POA: Diagnosis not present

## 2023-07-09 DIAGNOSIS — E119 Type 2 diabetes mellitus without complications: Secondary | ICD-10-CM | POA: Diagnosis not present

## 2023-07-09 DIAGNOSIS — E785 Hyperlipidemia, unspecified: Secondary | ICD-10-CM | POA: Diagnosis not present

## 2023-07-09 DIAGNOSIS — I4819 Other persistent atrial fibrillation: Secondary | ICD-10-CM | POA: Insufficient documentation

## 2023-07-09 DIAGNOSIS — I110 Hypertensive heart disease with heart failure: Secondary | ICD-10-CM | POA: Diagnosis not present

## 2023-07-09 DIAGNOSIS — Z7984 Long term (current) use of oral hypoglycemic drugs: Secondary | ICD-10-CM | POA: Insufficient documentation

## 2023-07-09 DIAGNOSIS — Z7901 Long term (current) use of anticoagulants: Secondary | ICD-10-CM | POA: Diagnosis not present

## 2023-07-09 LAB — BASIC METABOLIC PANEL
Anion gap: 11 (ref 5–15)
BUN: 37 mg/dL — ABNORMAL HIGH (ref 8–23)
CO2: 25 mmol/L (ref 22–32)
Calcium: 8.9 mg/dL (ref 8.9–10.3)
Chloride: 102 mmol/L (ref 98–111)
Creatinine, Ser: 1.7 mg/dL — ABNORMAL HIGH (ref 0.44–1.00)
GFR, Estimated: 30 mL/min — ABNORMAL LOW (ref >60)
Glucose, Bld: 159 mg/dL — ABNORMAL HIGH (ref 70–99)
Potassium: 3.9 mmol/L (ref 3.5–5.1)
Sodium: 138 mmol/L (ref 135–145)

## 2023-07-09 LAB — MAGNESIUM: Magnesium: 1.8 mg/dL (ref 1.7–2.4)

## 2023-07-09 NOTE — Telephone Encounter (Signed)
-----   Message from Nurse Stacy C sent at 07/09/2023  3:08 PM EST ----- Regarding: tikosyn Pt for tikosyn please review meds thanks stacy

## 2023-07-09 NOTE — TOC Benefit Eligibility Note (Signed)
Patient Product/process development scientist completed.    The patient is insured through [facility]. Patient has Medicare and is not eligible for a copay card, but may be able to apply for patient assistance, if available.    Ran test claim for dofetilide (Tikosyn) 500 mcg and the current 30 day co-pay is $4.50.   This test claim was processed through [facility]- copay amounts may vary at other pharmacies due to pharmacy/plan contracts, or as the patient moves through the different stages of their insurance plan.     Roland Earl, CPHT Pharmacy Technician III Certified Patient Advocate [facility] Patient Advocate Team Direct Number: [phone]  Fax: [phone]

## 2023-07-09 NOTE — Progress Notes (Signed)
Primary Care Physician: Irena Reichmann, DO Primary Cardiologist: Dr Jacques Navy Primary Electrophysiologist: Dr Elberta Fortis  Referring Physician: Dr Albertina Parr [place] Gilbert is a 82 y.o. female with a history of DM, HLD, HTN, and persistent atrial fibrillation who presents for follow up in the [facility]. The patient was initially diagnosed with atrial fibrillation 05/2019 after presenting with symptoms of worsening dyspnea on exertion. Patient is on Eliquis for a CHADS2VASC score of 5. There were no specific triggers that the patient could identify. She underwent DCCV on 07/08/19 but had ERAF. She was loaded on amiodarone and had DCCV again on 09/02/19 but unfortunately she was back in afib again. She denies snoring or alcohol use. Patient is s/p afib ablation with Dr Johney Frame on 12/02/19. She was back in afib on her follow up 01/05/20 and had another DCCV on 01/13/20. Her amiodarone was later discontinued.   She was seen by Dr Jacques Navy 04/20/23 and found to be in persistent afib. Thought to be precipitated by her COVID 19 infection a couple months previous. She underwent DCCV on 05/16/23 but was back out of rhythm on follow up 10/18. Started back on amiodarone 06/11/23.  On follow up today, patient discontinued the amiodarone due to intolerable side effects (nausea, weakness). She remains in afib today with symptoms of fatigue. No bleeding issues on anticoagulation.   Today, she denies symptoms of palpitations, chest pain, orthopnea, PND, dizziness, presyncope, syncope, snoring, daytime somnolence, bleeding, or neurologic sequela. The patient is tolerating medications without difficulties and is otherwise without complaint today.    Atrial Fibrillation Risk Factors:  she does not have symptoms or diagnosis of sleep apnea. she does not have a history of rheumatic fever. she does not have a history of alcohol use. The patient does not have a history of early familial atrial  fibrillation or other arrhythmias.   Atrial Fibrillation Management history:  Previous antiarrhythmic drugs: amiodarone Previous cardioversions: 07/08/19, 09/02/19, 05/16/23 Previous ablations: 12/02/19 Anticoagulation history: Eliquis   Past Medical History:  Diagnosis Date   Diabetes mellitus without complication (HCC)    Hyperlipidemia    Hypertension    Mitral regurgitation    evaluated by TEE 09/2019   Persistent atrial fibrillation (HCC)     Current Outpatient Medications  Medication Sig Dispense Refill   acetaminophen (TYLENOL) 500 MG tablet Take 1,000 mg by mouth every 8 (eight) hours as needed for moderate pain or headache.     apixaban (ELIQUIS) 5 MG TABS tablet Take 1 tablet (5 mg total) by mouth 2 (two) times daily. 180 tablet 1   carvedilol (COREG) 6.25 MG tablet Take 6.25 mg by mouth 2 (two) times daily.     Cholecalciferol (VITAMIN D-3) 125 MCG (5000 UT) TABS Take 5,000 Units by mouth daily with lunch.      ciprofloxacin (CILOXAN) 0.3 % ophthalmic solution Place 1 drop into the left eye See admin instructions. Instill 1 drop into the left eye 4 times daily for 2 days after each monthly eye injection     cyanocobalamin (VITAMIN B12) 1000 MCG tablet Take 1,000 mcg by mouth daily.     empagliflozin (JARDIANCE) 25 MG TABS tablet Take 25 mg by mouth daily.     ezetimibe (ZETIA) 10 MG tablet Take 10 mg by mouth daily.     furosemide (LASIX) 20 MG tablet Take 2 tablets (40 mg total) by mouth daily in the mornings and 1 tablet (20 mg total) by mouth daily in the evenings. (  Patient taking differently: Take 20 mg by mouth daily.) 360 tablet 3   glimepiride (AMARYL) 4 MG tablet Take 1 tablet (4 mg total) by mouth daily with breakfast. (Patient taking differently: Take 4 mg by mouth 2 (two) times daily.)  1   LORazepam (ATIVAN) 0.5 MG tablet Take 0.5 mg by mouth daily as needed for anxiety.     losartan (COZAAR) 100 MG tablet Take 100 mg by mouth daily.  6   metFORMIN (GLUCOPHAGE)  1000 MG tablet Take 1,000 mg by mouth 2 (two) times daily with a meal.   6   pravastatin (PRAVACHOL) 10 MG tablet Take 10 mg by mouth daily.     trolamine salicylate (ASPERCREME) 10 % cream Apply 1 Application topically as needed for muscle pain.     No current facility-administered medications for this encounter.    ROS- All systems are reviewed and negative except as per the HPI above.  Physical Exam: Vitals:   07/09/23 1426  BP: (!) 142/80  Pulse: 79  Weight: 63.1 kg  Height: 4\' 11"  (1.499 m)     GEN: Well nourished, well developed in no acute distress NECK: No JVD; No carotid bruits CARDIAC: Irregularly irregular rate and rhythm, no murmurs, rubs, gallops RESPIRATORY:  Clear to auscultation without rales, wheezing or rhonchi  ABDOMEN: Soft, non-tender, non-distended EXTREMITIES:  No edema; No deformity    Wt Readings from Last 3 Encounters:  07/09/23 63.1 kg  06/28/23 62.1 kg  06/11/23 63 kg    EKG today demonstrates  Afib RBBB Vent. rate 79 BPM PR interval * ms QRS duration 118 ms QT/QTcB 392/449 ms   Echo 10/21/21 demonstrated   1. Left ventricular ejection fraction by 3D volume is 56 %. The left  ventricle has normal function. The left ventricle has no regional wall  motion abnormalities. Left ventricular diastolic parameters are  indeterminate.   2. Right ventricular systolic function is normal. The right ventricular  size is normal. There is normal pulmonary artery systolic pressure.   3. The mitral valve is normal in structure. Trivial mitral valve  regurgitation. No evidence of mitral stenosis. Moderate mitral annular  calcification.   4. The aortic valve is normal in structure. Aortic valve regurgitation is  not visualized. Aortic valve sclerosis/calcification is present, without  any evidence of aortic stenosis.   5. The inferior vena cava is normal in size with greater than 50%  respiratory variability, suggesting right atrial pressure of 3 mmHg.    Epic records are reviewed at length today  CHA2DS2-VASc Score = 6  The patient's score is based upon: CHF History: 1 HTN History: 1 Diabetes History: 1 Stroke History: 0 Vascular Disease History: 0 Age Score: 2 Gender Score: 1       ASSESSMENT AND PLAN: Persistent Atrial Fibrillation/atrial flutter The patient's CHA2DS2-VASc score is 6, indicating a 9.7% annual risk of stroke.   S/p ablation 12/02/19 S/p DCCV 05/16/23 with quick return of afib Failed amiodarone due to intolerable side effects.  We discussed rhythm control options today. After discussing the risks and benefits, patient would like to start dofetilide.  Continue Eliquis 5 mg BID, states no missed doses in the last 3 weeks. No recent benadryl use PharmD to screen medications QTc in SR 432 ms Check bmet/mag today. Also check amiodarone level, hopefully because she only took for two weeks she will be able to start dofetilide quickly.  Continue Eliquis 5 mg BID Continue coreg 6.25 mg BID  Secondary Hypercoagulable State (ICD10:  U98.11) The patient is at significant risk for stroke/thromboembolism based upon her CHA2DS2-VASc Score of 6.  Continue Apixaban (Eliquis).   HTN Stable on current regimen  Chronic diastolic CHF Fluid status appears stable   Follow up in the AF clinic for dofetilide loading and then with Dr Elberta Fortis as scheduled.    Jorja Loa PA-C Afib Clinic [facility] [address] [place], [place] [address] [phone] 07/09/2023 2:57 PM

## 2023-07-10 NOTE — Telephone Encounter (Signed)
Medication list reviewed in anticipation of upcoming Tikosyn initiation. Patient is not taking any contraindicated or QTc prolonging medications.   Patient is anticoagulated on Eliquis on the appropriate dose.  Spoke with patient today to decrease dose from 5 mg to 2.5 mg based on current age and renal function.  Patient confirms she has not missed any anticoagulation doses in the 3 weeks prior to Tikosyn initiation.   Patient will need to be counseled to avoid use of Benadryl while on Tikosyn and in the 2-3 days prior to Tikosyn initiation.

## 2023-07-11 ENCOUNTER — Other Ambulatory Visit (HOSPITAL_COMMUNITY): Payer: Self-pay | Admitting: *Deleted

## 2023-07-11 DIAGNOSIS — I480 Paroxysmal atrial fibrillation: Secondary | ICD-10-CM

## 2023-07-11 MED ORDER — APIXABAN 2.5 MG PO TABS: 2.5000 mg | ORAL_TABLET | Freq: Two times a day (BID) | ORAL | 1 refills | Status: DC

## 2023-07-11 MED ORDER — MAGNESIUM OXIDE 400 MG PO TABS: 400.0000 mg | ORAL_TABLET | Freq: Every day | ORAL | 3 refills | Status: DC

## 2023-07-11 NOTE — Telephone Encounter (Signed)
Pt notified of need to reduce Eliquis to 2.5mg  BID. RX sent to pharmacy.

## 2023-07-16 LAB — AMIODARONE LEVEL
Amiodarone Lvl: <100 ng/mL — ABNORMAL LOW (ref 1000–2500)
N-Desethyl-Amiodarone: <100 ng/mL

## 2023-07-26 ENCOUNTER — Encounter (INDEPENDENT_AMBULATORY_CARE_PROVIDER_SITE_OTHER): Payer: Medicare Other | Admitting: Ophthalmology

## 2023-07-26 DIAGNOSIS — E113291 Type 2 diabetes mellitus with mild nonproliferative diabetic retinopathy without macular edema, right eye: Secondary | ICD-10-CM | POA: Diagnosis not present

## 2023-07-26 DIAGNOSIS — E113312 Type 2 diabetes mellitus with moderate nonproliferative diabetic retinopathy with macular edema, left eye: Secondary | ICD-10-CM | POA: Diagnosis not present

## 2023-07-26 DIAGNOSIS — I10 Essential (primary) hypertension: Secondary | ICD-10-CM

## 2023-07-26 DIAGNOSIS — H43813 Vitreous degeneration, bilateral: Secondary | ICD-10-CM | POA: Diagnosis not present

## 2023-07-26 DIAGNOSIS — H35033 Hypertensive retinopathy, bilateral: Secondary | ICD-10-CM

## 2023-07-26 DIAGNOSIS — Z7984 Long term (current) use of oral hypoglycemic drugs: Secondary | ICD-10-CM

## 2023-07-26 DIAGNOSIS — H338 Other retinal detachments: Secondary | ICD-10-CM

## 2023-07-27 ENCOUNTER — Encounter (HOSPITAL_COMMUNITY): Payer: Self-pay

## 2023-07-30 ENCOUNTER — Encounter (HOSPITAL_COMMUNITY): Payer: Self-pay | Admitting: Cardiology

## 2023-07-30 ENCOUNTER — Ambulatory Visit (HOSPITAL_BASED_OUTPATIENT_CLINIC_OR_DEPARTMENT_OTHER)
Admission: RE | Admit: 2023-07-30 | Discharge: 2023-07-30 | Disposition: A | Discharge status: Home / Self Care | Payer: Medicare Other | Source: Ambulatory Visit | Attending: Physician Assistant | Admitting: Physician Assistant

## 2023-07-30 ENCOUNTER — Inpatient Hospital Stay (HOSPITAL_COMMUNITY)
Admission: RE | Admit: 2023-07-30 | Discharge: 2023-08-02 | DRG: 309 | Disposition: A | Discharge status: Home / Self Care | Payer: Medicare Other | Source: Ambulatory Visit | Attending: Cardiology | Admitting: Cardiology

## 2023-07-30 ENCOUNTER — Encounter (HOSPITAL_COMMUNITY): Payer: Self-pay | Admitting: Physician Assistant

## 2023-07-30 ENCOUNTER — Other Ambulatory Visit: Payer: Self-pay

## 2023-07-30 VITALS — BP 162/80 | HR 71 | Ht 59.0 in | Wt 142.6 lb

## 2023-07-30 DIAGNOSIS — I130 Hypertensive heart and chronic kidney disease with heart failure and stage 1 through stage 4 chronic kidney disease, or unspecified chronic kidney disease: Secondary | ICD-10-CM | POA: Diagnosis not present

## 2023-07-30 DIAGNOSIS — E785 Hyperlipidemia, unspecified: Secondary | ICD-10-CM | POA: Diagnosis present

## 2023-07-30 DIAGNOSIS — D6869 Other thrombophilia: Secondary | ICD-10-CM | POA: Diagnosis present

## 2023-07-30 DIAGNOSIS — I110 Hypertensive heart disease with heart failure: Secondary | ICD-10-CM | POA: Diagnosis not present

## 2023-07-30 DIAGNOSIS — I5032 Chronic diastolic (congestive) heart failure: Secondary | ICD-10-CM | POA: Diagnosis not present

## 2023-07-30 DIAGNOSIS — Z79899 Other long term (current) drug therapy: Secondary | ICD-10-CM | POA: Diagnosis not present

## 2023-07-30 DIAGNOSIS — Z7984 Long term (current) use of oral hypoglycemic drugs: Secondary | ICD-10-CM | POA: Diagnosis not present

## 2023-07-30 DIAGNOSIS — E1122 Type 2 diabetes mellitus with diabetic chronic kidney disease: Secondary | ICD-10-CM | POA: Diagnosis not present

## 2023-07-30 DIAGNOSIS — Z885 Allergy status to narcotic agent status: Secondary | ICD-10-CM

## 2023-07-30 DIAGNOSIS — Z8616 Personal history of COVID-19: Secondary | ICD-10-CM | POA: Diagnosis not present

## 2023-07-30 DIAGNOSIS — I4510 Unspecified right bundle-branch block: Secondary | ICD-10-CM | POA: Diagnosis present

## 2023-07-30 DIAGNOSIS — Z7901 Long term (current) use of anticoagulants: Secondary | ICD-10-CM | POA: Diagnosis not present

## 2023-07-30 DIAGNOSIS — N184 Chronic kidney disease, stage 4 (severe): Secondary | ICD-10-CM | POA: Diagnosis present

## 2023-07-30 DIAGNOSIS — I080 Rheumatic disorders of both mitral and aortic valves: Secondary | ICD-10-CM | POA: Diagnosis not present

## 2023-07-30 DIAGNOSIS — Z880 Allergy status to penicillin: Secondary | ICD-10-CM

## 2023-07-30 DIAGNOSIS — I4892 Unspecified atrial flutter: Secondary | ICD-10-CM | POA: Diagnosis not present

## 2023-07-30 DIAGNOSIS — I4819 Other persistent atrial fibrillation: Secondary | ICD-10-CM

## 2023-07-30 DIAGNOSIS — E119 Type 2 diabetes mellitus without complications: Secondary | ICD-10-CM | POA: Diagnosis not present

## 2023-07-30 DIAGNOSIS — I4891 Unspecified atrial fibrillation: Secondary | ICD-10-CM | POA: Diagnosis not present

## 2023-07-30 DIAGNOSIS — I5031 Acute diastolic (congestive) heart failure: Secondary | ICD-10-CM | POA: Diagnosis not present

## 2023-07-30 LAB — BASIC METABOLIC PANEL
Anion gap: 13 (ref 5–15)
BUN: 27 mg/dL — ABNORMAL HIGH (ref 8–23)
CO2: 24 mmol/L (ref 22–32)
Calcium: 9 mg/dL (ref 8.9–10.3)
Chloride: 97 mmol/L — ABNORMAL LOW (ref 98–111)
Creatinine, Ser: 1.59 mg/dL — ABNORMAL HIGH (ref 0.44–1.00)
GFR, Estimated: 33 mL/min — ABNORMAL LOW (ref >60)
Glucose, Bld: 195 mg/dL — ABNORMAL HIGH (ref 70–99)
Potassium: 4 mmol/L (ref 3.5–5.1)
Sodium: 134 mmol/L — ABNORMAL LOW (ref 135–145)

## 2023-07-30 LAB — MAGNESIUM: Magnesium: 1.8 mg/dL (ref 1.7–2.4)

## 2023-07-30 MED ORDER — EZETIMIBE 10 MG PO TABS
10.0000 mg | ORAL_TABLET | Freq: Every day | ORAL | Status: DC
  Administered 2023-07-30 – 2023-08-01 (×3): 10 mg via ORAL
  Filled 2023-07-30 (×3): qty 1

## 2023-07-30 MED ORDER — LOSARTAN POTASSIUM 50 MG PO TABS
100.0000 mg | ORAL_TABLET | Freq: Every day | ORAL | Status: DC
  Administered 2023-07-31 – 2023-08-02 (×3): 100 mg via ORAL
  Filled 2023-07-30 (×3): qty 2

## 2023-07-30 MED ORDER — MAGNESIUM OXIDE 400 MG PO TABS
400.0000 mg | ORAL_TABLET | Freq: Every day | ORAL | Status: DC
  Filled 2023-07-30: qty 1

## 2023-07-30 MED ORDER — MAGNESIUM SULFATE 2 GM/50ML IV SOLN
2.0000 g | Freq: Once | INTRAVENOUS | Status: AC
  Administered 2023-07-30: 2 g via INTRAVENOUS
  Filled 2023-07-30: qty 50

## 2023-07-30 MED ORDER — ACETAMINOPHEN 500 MG PO TABS
1000.0000 mg | ORAL_TABLET | Freq: Three times a day (TID) | ORAL | Status: DC | PRN
  Administered 2023-08-02: 1000 mg via ORAL
  Filled 2023-07-30: qty 2

## 2023-07-30 MED ORDER — SODIUM CHLORIDE 0.9 % IV SOLN: 250.0000 mL | INTRAVENOUS | Status: DC | PRN

## 2023-07-30 MED ORDER — DOFETILIDE 125 MCG PO CAPS
125.0000 ug | ORAL_CAPSULE | Freq: Two times a day (BID) | ORAL | Status: DC
  Administered 2023-07-30 – 2023-08-02 (×6): 125 ug via ORAL
  Filled 2023-07-30 (×7): qty 1

## 2023-07-30 MED ORDER — SODIUM CHLORIDE 0.9% FLUSH: 3.0000 mL | Freq: Two times a day (BID) | INTRAVENOUS | Status: DC

## 2023-07-30 MED ORDER — FUROSEMIDE 20 MG PO TABS: 20.0000 mg | ORAL_TABLET | Freq: Every day | ORAL | Status: DC

## 2023-07-30 MED ORDER — METFORMIN HCL 500 MG PO TABS
1000.0000 mg | ORAL_TABLET | Freq: Two times a day (BID) | ORAL | Status: DC
  Administered 2023-07-30 – 2023-07-31 (×2): 1000 mg via ORAL
  Filled 2023-07-30 (×2): qty 2

## 2023-07-30 MED ORDER — PRAVASTATIN SODIUM 10 MG PO TABS
10.0000 mg | ORAL_TABLET | Freq: Every day | ORAL | Status: DC
  Administered 2023-07-30 – 2023-08-01 (×3): 10 mg via ORAL
  Filled 2023-07-30 (×3): qty 1

## 2023-07-30 MED ORDER — FUROSEMIDE 20 MG PO TABS
20.0000 mg | ORAL_TABLET | Freq: Two times a day (BID) | ORAL | Status: DC
  Administered 2023-07-30 – 2023-08-02 (×5): 20 mg via ORAL
  Filled 2023-07-30 (×6): qty 1

## 2023-07-30 MED ORDER — SODIUM CHLORIDE 0.9% FLUSH: 3.0000 mL | INTRAVENOUS | Status: DC | PRN

## 2023-07-30 MED ORDER — VITAMIN D 25 MCG (1000 UNIT) PO TABS
5000.0000 [IU] | ORAL_TABLET | Freq: Every day | ORAL | Status: DC
  Administered 2023-07-30 – 2023-08-02 (×4): 5000 [IU] via ORAL
  Filled 2023-07-30 (×5): qty 5

## 2023-07-30 MED ORDER — LORAZEPAM 0.5 MG PO TABS: 0.5000 mg | ORAL_TABLET | Freq: Every day | ORAL | Status: DC | PRN

## 2023-07-30 MED ORDER — GLIMEPIRIDE 4 MG PO TABS
4.0000 mg | ORAL_TABLET | Freq: Two times a day (BID) | ORAL | Status: DC
  Administered 2023-07-30 – 2023-08-02 (×5): 4 mg via ORAL
  Filled 2023-07-30 (×7): qty 1

## 2023-07-30 MED ORDER — FUROSEMIDE 40 MG PO TABS: 40.0000 mg | ORAL_TABLET | Freq: Every day | ORAL | Status: DC

## 2023-07-30 MED ORDER — CARVEDILOL 6.25 MG PO TABS
6.2500 mg | ORAL_TABLET | Freq: Two times a day (BID) | ORAL | Status: DC
  Administered 2023-07-30 – 2023-08-02 (×6): 6.25 mg via ORAL
  Filled 2023-07-30 (×6): qty 1

## 2023-07-30 MED ORDER — VITAMIN D3 25 MCG (1000 UNIT) PO TABS
5000.0000 [IU] | ORAL_TABLET | Freq: Every day | ORAL | Status: DC
  Filled 2023-07-30: qty 5

## 2023-07-30 MED ORDER — VITAMIN B-12 1000 MCG PO TABS
1000.0000 ug | ORAL_TABLET | Freq: Every day | ORAL | Status: DC
  Administered 2023-07-31 – 2023-08-02 (×3): 1000 ug via ORAL
  Filled 2023-07-30 (×3): qty 1

## 2023-07-30 MED ORDER — APIXABAN 2.5 MG PO TABS
2.5000 mg | ORAL_TABLET | Freq: Two times a day (BID) | ORAL | Status: DC
  Administered 2023-07-30 – 2023-08-02 (×6): 2.5 mg via ORAL
  Filled 2023-07-30 (×6): qty 1

## 2023-07-30 NOTE — Plan of Care (Signed)
  Problem: Education: Goal: Knowledge of General Education information will improve Description: Including pain rating scale, medication(s)/side effects and non-pharmacologic comfort measures Outcome: Progressing   Problem: Health Behavior/Discharge Planning: Goal: Ability to manage health-related needs will improve Outcome: Progressing   Problem: Clinical Measurements: Goal: Ability to maintain clinical measurements within normal limits will improve Outcome: Progressing Goal: Will remain free from infection Outcome: Progressing Goal: Diagnostic test results will improve Outcome: Progressing Goal: Respiratory complications will improve Outcome: Progressing Goal: Cardiovascular complication will be avoided Outcome: Progressing   Problem: Activity: Goal: Risk for activity intolerance will decrease Outcome: Progressing   Problem: Nutrition: Goal: Adequate nutrition will be maintained Outcome: Progressing   Problem: Coping: Goal: Level of anxiety will decrease Outcome: Progressing   Problem: Elimination: Goal: Will not experience complications related to bowel motility Outcome: Progressing Goal: Will not experience complications related to urinary retention Outcome: Progressing   Problem: Pain Management: Goal: General experience of comfort will improve Outcome: Progressing   Problem: Skin Integrity: Goal: Risk for impaired skin integrity will decrease Outcome: Progressing   Problem: Education: Goal: Knowledge of disease or condition will improve Outcome: Progressing Goal: Understanding of medication regimen will improve Outcome: Progressing Goal: Individualized Educational Video(s) Outcome: Progressing   Problem: Activity: Goal: Ability to tolerate increased activity will improve Outcome: Progressing   Problem: Cardiac: Goal: Ability to achieve and maintain adequate cardiopulmonary perfusion will improve Outcome: Progressing   Problem: Health  Behavior/Discharge Planning: Goal: Ability to safely manage health-related needs after discharge will improve Outcome: Progressing

## 2023-07-30 NOTE — Progress Notes (Signed)
Primary Care Physician: Irena Reichmann, DO Primary Cardiologist: Dr Jacques Navy Primary Electrophysiologist: Dr Elberta Fortis  Referring Physician: Dr Albertina Parr [place] Gilbert is a 82 y.o. female with a history of DM, HLD, HTN, and persistent atrial fibrillation who presents for follow up in the [facility]. The patient was initially diagnosed with atrial fibrillation 05/2019 after presenting with symptoms of worsening dyspnea on exertion. Patient is on Eliquis for a CHADS2VASC score of 5. There were no specific triggers that the patient could identify. She underwent DCCV on 07/08/19 but had ERAF. She was loaded on amiodarone and had DCCV again on 09/02/19 but unfortunately she was back in afib again. She denies snoring or alcohol use. Patient is s/p afib ablation with Dr Johney Frame on 12/02/19. She was back in afib on her follow up 01/05/20 and had another DCCV on 01/13/20. Her amiodarone was later discontinued.   She was seen by Dr Jacques Navy 04/20/23 and found to be in persistent afib. Thought to be precipitated by her COVID 19 infection a couple months previous. She underwent DCCV on 05/16/23 but was back out of rhythm on follow up 10/18. Started back on amiodarone 06/11/23 but had to discontinue due to intolerable side effects (nausea, weakness).   On follow up today, patient presents for dofetilide admission. She denies any missed doses of anticoagulation in the past 3 weeks. She remains fatigued despite rate control.   Today, she denies symptoms of palpitations, chest pain, orthopnea, PND, dizziness, presyncope, syncope, snoring, daytime somnolence, bleeding, or neurologic sequela. The patient is tolerating medications without difficulties and is otherwise without complaint today.    Atrial Fibrillation Risk Factors:  she does not have symptoms or diagnosis of sleep apnea. she does not have a history of rheumatic fever. she does not have a history of alcohol use. The patient does  not have a history of early familial atrial fibrillation or other arrhythmias.   Atrial Fibrillation Management history:  Previous antiarrhythmic drugs: amiodarone Previous cardioversions: 07/08/19, 09/02/19, 05/16/23 Previous ablations: 12/02/19 Anticoagulation history: Eliquis   Past Medical History:  Diagnosis Date   Diabetes mellitus without complication (HCC)    Hyperlipidemia    Hypertension    Mitral regurgitation    evaluated by TEE 09/2019   Persistent atrial fibrillation (HCC)     Current Outpatient Medications  Medication Sig Dispense Refill   acetaminophen (TYLENOL) 500 MG tablet Take 1,000 mg by mouth every 8 (eight) hours as needed for moderate pain or headache.     apixaban (ELIQUIS) 2.5 MG TABS tablet Take 1 tablet (2.5 mg total) by mouth 2 (two) times daily. 180 tablet 1   carvedilol (COREG) 6.25 MG tablet Take 6.25 mg by mouth 2 (two) times daily.     Cholecalciferol (VITAMIN D-3) 125 MCG (5000 UT) TABS Take 5,000 Units by mouth daily with lunch.      ciprofloxacin (CILOXAN) 0.3 % ophthalmic solution Place 1 drop into the left eye See admin instructions. Instill 1 drop into the left eye 4 times daily for 2 days after each monthly eye injection     cyanocobalamin (VITAMIN B12) 1000 MCG tablet Take 1,000 mcg by mouth daily.     empagliflozin (JARDIANCE) 25 MG TABS tablet Take 25 mg by mouth daily.     ezetimibe (ZETIA) 10 MG tablet Take 10 mg by mouth daily.     furosemide (LASIX) 20 MG tablet Take 2 tablets (40 mg total) by mouth daily in the mornings and 1  tablet (20 mg total) by mouth daily in the evenings. (Patient taking differently: Take 20 mg by mouth daily.) 360 tablet 3   glimepiride (AMARYL) 4 MG tablet Take 1 tablet (4 mg total) by mouth daily with breakfast. (Patient taking differently: Take 4 mg by mouth 2 (two) times daily.)  1   LORazepam (ATIVAN) 0.5 MG tablet Take 0.5 mg by mouth daily as needed for anxiety.     losartan (COZAAR) 100 MG tablet Take 100  mg by mouth daily.  6   magnesium oxide (MAG-OX) 400 MG tablet Take 1 tablet (400 mg total) by mouth daily. 30 tablet 3   metFORMIN (GLUCOPHAGE) 1000 MG tablet Take 1,000 mg by mouth 2 (two) times daily with a meal.   6   pravastatin (PRAVACHOL) 10 MG tablet Take 10 mg by mouth daily.     trolamine salicylate (ASPERCREME) 10 % cream Apply 1 Application topically as needed for muscle pain.     No current facility-administered medications for this encounter.    ROS- All systems are reviewed and negative except as per the HPI above.  Physical Exam: Vitals:   07/30/23 0944  BP: (!) 162/80  Pulse: 71  Weight: 64.7 kg  Height: 4\' 11"  (1.499 m)     GEN: Well nourished, well developed in no acute distress NECK: No JVD; No carotid bruits CARDIAC: Irregularly irregular rate and rhythm, no murmurs, rubs, gallops RESPIRATORY:  Clear to auscultation without rales, wheezing or rhonchi  ABDOMEN: Soft, non-tender, non-distended EXTREMITIES:  No edema; No deformity    Wt Readings from Last 3 Encounters:  07/30/23 64.7 kg  07/09/23 63.1 kg  06/28/23 62.1 kg    EKG today demonstrates  Afib, RBBB Vent. rate 71 BPM PR interval * ms QRS duration 118 ms QT/QTcB 422/458 ms   Echo 10/21/21 demonstrated   1. Left ventricular ejection fraction by 3D volume is 56 %. The left  ventricle has normal function. The left ventricle has no regional wall  motion abnormalities. Left ventricular diastolic parameters are  indeterminate.   2. Right ventricular systolic function is normal. The right ventricular  size is normal. There is normal pulmonary artery systolic pressure.   3. The mitral valve is normal in structure. Trivial mitral valve  regurgitation. No evidence of mitral stenosis. Moderate mitral annular  calcification.   4. The aortic valve is normal in structure. Aortic valve regurgitation is  not visualized. Aortic valve sclerosis/calcification is present, without  any evidence of aortic  stenosis.   5. The inferior vena cava is normal in size with greater than 50%  respiratory variability, suggesting right atrial pressure of 3 mmHg.   Epic records are reviewed at length today  CHA2DS2-VASc Score = 6  The patient's score is based upon: CHF History: 1 HTN History: 1 Diabetes History: 1 Stroke History: 0 Vascular Disease History: 0 Age Score: 2 Gender Score: 1       ASSESSMENT AND PLAN: Persistent Atrial Fibrillation/atrial flutter The patient's CHA2DS2-VASc score is 6, indicating a 9.7% annual risk of stroke.   S/p ablation 12/02/19 S/p DCCV 05/16/23 with quick return of afib Failed amiodarone due to intolerable side effects.  Patient presents for dofetilide admission. Continue Eliquis 2.5 mg BID, states no missed doses in the last 3 weeks. No recent benadryl use PharmD has screened medications QTc in SR 432 ms Labs today show creatinine at 1.59, K+ 4.0 and mag 1.8, CrCl calculated at 29 mL/min Continue coreg 6.25 mg BID  Secondary  Hypercoagulable State (ICD10:  D68.69) The patient is at significant risk for stroke/thromboembolism based upon her CHA2DS2-VASc Score of 6.  Continue Apixaban (Eliquis).   HTN Elevated today, reassess in SR  Chronic HFpEF Fluid status appears stable   To be admitted later today once a bed becomes available.    Jorja Loa PA-C Afib Clinic [facility] [address] [place], [place] [address] [phone] 07/30/2023 9:52 AM

## 2023-07-30 NOTE — H&P (Addendum)
Primary Care Physician: Irena Reichmann, DO Primary Cardiologist: Dr Jacques Navy Primary Electrophysiologist: Dr Elberta Fortis  Referring Physician: Dr Albertina Parr Virginia Gilbert is a 82 y.o. female with a history of DM, HLD, HTN, and persistent atrial fibrillation who presents for follow up in the [facility]. The patient was initially diagnosed with atrial fibrillation 05/2019 after presenting with symptoms of worsening dyspnea on exertion. Patient is on Eliquis for a CHADS2VASC score of 5. There were no specific triggers that the patient could identify. She underwent DCCV on 07/08/19 but had ERAF. She was loaded on amiodarone and had DCCV again on 09/02/19 but unfortunately she was back in afib again. She denies snoring or alcohol use. Patient is s/p afib ablation with Dr Johney Frame on 12/02/19. She was back in afib on her follow up 01/05/20 and had another DCCV on 01/13/20. Her amiodarone was later discontinued.   She was seen by Dr Jacques Navy 04/20/23 and found to be in persistent afib. Thought to be precipitated by her COVID 19 infection a couple months previous. She underwent DCCV on 05/16/23 but was back out of rhythm on follow up 10/18. Started back on amiodarone 06/11/23 but had to discontinue due to intolerable side effects (nausea, weakness).   On follow up today, patient presents for dofetilide admission. She denies any missed doses of anticoagulation in the past 3 weeks. She remains fatigued despite rate control.   Today, she denies symptoms of palpitations, chest pain, orthopnea, PND, dizziness, presyncope, syncope, snoring, daytime somnolence, bleeding, or neurologic sequela. The patient is tolerating medications without difficulties and is otherwise without complaint today.    Atrial Fibrillation Risk Factors:  she does not have symptoms or diagnosis of sleep apnea. she does not have a history of rheumatic fever. she does not have a history of alcohol use. The patient does  not have a history of early familial atrial fibrillation or other arrhythmias.   Atrial Fibrillation Management history:  Previous antiarrhythmic drugs: amiodarone Previous cardioversions: 07/08/19, 09/02/19, 05/16/23 Previous ablations: 12/02/19 Anticoagulation history: Eliquis   Past Medical History:  Diagnosis Date   Diabetes mellitus without complication (HCC)    Hyperlipidemia    Hypertension    Mitral regurgitation    evaluated by TEE 09/2019   Persistent atrial fibrillation (HCC)     Current Facility-Administered Medications  Medication Dose Route Frequency Provider Last Rate Last Admin   acetaminophen (TYLENOL) tablet 1,000 mg  1,000 mg Oral Q8H PRN Fenton, Clint R, PA       apixaban (ELIQUIS) tablet 2.5 mg  2.5 mg Oral BID Fenton, Clint R, PA       carvedilol (COREG) tablet 6.25 mg  6.25 mg Oral BID Fenton, Clint R, PA       [START ON 07/31/2023] cyanocobalamin (VITAMIN B12) tablet 1,000 mcg  1,000 mcg Oral Daily Fenton, Clint R, PA       dofetilide (TIKOSYN) capsule 125 mcg  125 mcg Oral BID Fenton, Clint R, PA       ezetimibe (ZETIA) tablet 10 mg  10 mg Oral Daily Fenton, Clint R, PA       furosemide (LASIX) tablet 20 mg  20 mg Oral QHS Fenton, Clint R, PA       [START ON 08/01/2023] furosemide (LASIX) tablet 40 mg  40 mg Oral Daily Fenton, Clint R, PA       glimepiride (AMARYL) tablet 4 mg  4 mg Oral BID Fenton, Clint R, PA  LORazepam (ATIVAN) tablet 0.5 mg  0.5 mg Oral Daily PRN Fenton, Clint R, PA       [START ON 07/31/2023] losartan (COZAAR) tablet 100 mg  100 mg Oral Daily Fenton, Clint R, PA       [START ON 07/31/2023] magnesium oxide (MAG-OX) tablet 400 mg  400 mg Oral QHS Fenton, Clint R, PA       magnesium sulfate IVPB 2 g 50 mL  2 g Intravenous Once Huntley Demedeiros Daphine Deutscher, MD       metFORMIN (GLUCOPHAGE) tablet 1,000 mg  1,000 mg Oral BID WC Fenton, Clint R, PA       pravastatin (PRAVACHOL) tablet 10 mg  10 mg Oral QHS Fenton, Clint R, PA       Vitamin D-3  TABS 5,000 Units  5,000 Units Oral Q lunch Fenton, Clint R, PA       Facility-Administered Medications Ordered in Other Encounters  Medication Dose Route Frequency Provider Last Rate Last Admin   sodium chloride flush (NS) 0.9 % injection 3 mL  3 mL Intravenous Q12H Fenton, Clint R, PA        ROS- All systems are reviewed and negative except as per the HPI above.  Physical Exam: Vitals:   07/30/23 1036  BP: (!) 151/76  Pulse: 66  Resp: 18  Temp: 97.7 F (36.5 C)  TempSrc: Oral     GEN: Well nourished, well developed in no acute distress NECK: No JVD; No carotid bruits CARDIAC: Irregularly irregular rate and rhythm, no murmurs, rubs, gallops RESPIRATORY:  Clear to auscultation without rales, wheezing or rhonchi  ABDOMEN: Soft, non-tender, non-distended EXTREMITIES:  No edema; No deformity    Wt Readings from Last 3 Encounters:  07/30/23 64.7 kg  07/09/23 63.1 kg  06/28/23 62.1 kg    EKG today demonstrates  Afib, RBBB Vent. rate 71 BPM PR interval * ms QRS duration 118 ms QT/QTcB 422/458 ms   Echo 10/21/21 demonstrated   1. Left ventricular ejection fraction by 3D volume is 56 %. The left  ventricle has normal function. The left ventricle has no regional wall  motion abnormalities. Left ventricular diastolic parameters are  indeterminate.   2. Right ventricular systolic function is normal. The right ventricular  size is normal. There is normal pulmonary artery systolic pressure.   3. The mitral valve is normal in structure. Trivial mitral valve  regurgitation. No evidence of mitral stenosis. Moderate mitral annular  calcification.   4. The aortic valve is normal in structure. Aortic valve regurgitation is  not visualized. Aortic valve sclerosis/calcification is present, without  any evidence of aortic stenosis.   5. The inferior vena cava is normal in size with greater than 50%  respiratory variability, suggesting right atrial pressure of 3 mmHg.   Epic records  are reviewed at length today  CHA2DS2-VASc Score = 6  The patient's score is based upon: CHF History: 1 HTN History: 1 Diabetes History: 1 Stroke History: 0 Vascular Disease History: 0 Age Score: 2 Gender Score: 1       ASSESSMENT AND PLAN: Persistent Atrial Fibrillation/atrial flutter The patient's CHA2DS2-VASc score is 6, indicating a 9.7% annual risk of stroke.   S/p ablation 12/02/19 S/p DCCV 05/16/23 with quick return of afib Failed amiodarone due to intolerable side effects.  Patient presents for dofetilide admission. Continue Eliquis 2.5 mg BID, states no missed doses in the last 3 weeks. No recent benadryl use PharmD has screened medications QTc in SR 432 ms Labs today  show creatinine at 1.59, K+ 4.0 and mag 1.8, CrCl calculated at 29 mL/min Continue coreg 6.25 mg BID  Secondary Hypercoagulable State (ICD10:  D68.69) The patient is at significant risk for stroke/thromboembolism based upon her CHA2DS2-VASc Score of 6.  Continue Apixaban (Eliquis).   HTN Elevated today, reassess in SR  Chronic HFpEF Fluid status appears stable   To be admitted later today once a bed becomes available.    Jorja Loa PA-C Afib Clinic [facility] [address] [place], [place] [address] [phone] 07/30/2023 11:13 AM   ----------------------------------------------------------------------------  Full H&P above per Jorja Loa, PA-C.    Patient admitted from AF Clinic for Tikosyn load / high risk drug monitoring.   Hx of persistent AF requiring multiple DCCV (06/2019, 08/2019, 01/2020, 05/2023) and AF ablation with Dr. Johney Frame 11/2019. Hx of intolerance to amiodarone due to nausea & weakness. No missed doses of Eliquis 2.5mg  BID in last 3 weeks. CHA2DS2-VASc 6. LVEF 2023 56%. QTc in SR 425-432 ms. K+ 4 / Mg+ 1.8.  Sr Cr 1.59 / CrCl 72mL/min. Based on CrCl, tikosyn dosing of 125 mcg BID.  Of note, pt reports her PCP recently reduced her lasix to 20mg  BID.  Virginia Gilbert  continue at that dose for now & eval response while inpatient.    Canary Brim, MSN, APRN, NP-C, AGACNP-BC [facility] - Electrophysiology  07/30/2023, 11:18 AM   I have seen and examined this patient with Canary Brim.  Agree with above, note added to reflect my findings.  With a past history as above.  She has a history of atrial fibrillation.  She is post ablation in 2021, though has continued to have episodes of atrial fibrillation.  She was initially on amiodarone, but this has been stopped.  She presents to the hospital for admission for dofetilide load.  GEN: Well nourished, well developed, in no acute distress  HEENT: normal  Neck: no JVD, carotid bruits, or masses Cardiac: Irregular  Respiratory:  clear to auscultation bilaterally, normal work of breathing GI: soft, nontender, nondistended, + BS MS: no deformity or atrophy  Skin: warm and dry,  Neuro:  Strength and sensation are intact Psych: euthymic mood, full affect   Persistent atrial fibrillation: Plans for dofetilide load.  QTc stable.  Creatinine elevated and thus we Virginia Gilbert start at 125 mcg twice daily.  If she does not convert to sinus rhythm, plan for cardioversion on Wednesday.  Keep magnesium greater than 2, potassium greater than 4. Hypertension: Elevated today.  Virginia Gilbert reassess once in sinus rhythm Heart failure with preserved ejection fraction: No obvious volume overload CKD stage IV: Stable  Virginia Gilbert M. Giorgio Chabot MD 07/30/2023 12:02 PM

## 2023-07-30 NOTE — Progress Notes (Signed)
Mobility Specialist Progress Note:    07/30/23 1400  Mobility  Activity Ambulated with assistance in hallway  Level of Assistance Contact guard assist, steadying assist  Assistive Device None  Distance Ambulated (ft) 180 ft  Activity Response Tolerated well  Mobility Referral Yes  Mobility visit 1 Mobility  Mobility Specialist Start Time (ACUTE ONLY) 1443  Mobility Specialist Stop Time (ACUTE ONLY) 1448  Mobility Specialist Time Calculation (min) (ACUTE ONLY) 5 min   Pt received in bed, agreeable to mobility. Only requiring minG throughout. Pt stated "I don't like walking because it gets me short of breath". VSS throughout. Pt left in bed with call bell and all needs met.  Virginia Gilbert Mobility Specialist Please contact via Special educational needs teacher or Rehab office at [phone]

## 2023-07-30 NOTE — Progress Notes (Signed)
Pharmacy: Dofetilide (Tikosyn) - Initial Consult Assessment and Electrolyte Replacement  Pharmacy consulted to assist in monitoring and replacing electrolytes in this 82 y.o. female admitted on 07/30/2023 undergoing dofetilide initiation. First dofetilide dose: 125 mcg BID 12/16 PM  Copay check completed 07/09/23 - $4.50 / mo  Assessment:  Patient Exclusion Criteria: If any screening criteria checked as "Yes", then  patient  should NOT receive dofetilide until criteria item is corrected.  If "Yes" please indicate correction plan.  YES  NO Patient  Exclusion Criteria Correction Plan   []   [x]   Baseline QTc interval is greater than or equal to 440 msec. IF above YES box checked dofetilide contraindicated unless patient has ICD; then may proceed if QTc 500-550 msec or with known ventricular conduction abnormalities may proceed with QTc 550-600 msec. QTc = 432 ms per AF clinic note    []   [x]   Patient is known or suspected to have a digoxin level greater than 2 ng/ml: No results found for: "DIGOXIN"     []   []   Creatinine clearance less than 20 ml/min (calculated using Cockcroft-Gault, actual body weight and serum creatinine): eCRCl using actual BW = 29 mL/min (Scr 1.59)     []   [x]  Patient has received drugs known to prolong the QT intervals within the last 48 hours (phenothiazines, tricyclics or tetracyclic antidepressants, erythromycin, H-1 antihistamines, cisapride, fluoroquinolones, azithromycin, ondansetron).   Updated information on QT prolonging agents is available to be searched on the following database:QT prolonging agents     []   [x]  Patient received a dose of a thiazide diuretic in the last 48 hours [including hydrochlorothiazide (Oretic) alone or in any combination including triamterene (Dyazide, Maxzide)].    []   [x]  Patient received a medication known to increase dofetilide plasma concentrations prior to initial dofetilide dose:  Trimethoprim (Primsol, Proloprim)  in the last 36 hours Verapamil (Calan, Verelan) in the last 36 hours or a sustained release dose in the last 72 hours Megestrol (Megace) in the last 5 days  Cimetidine (Tagamet) in the last 6 hours Ketoconazole (Nizoral) in the last 24 hours Itraconazole (Sporanox) in the last 48 hours  Prochlorperazine (Compazine) in the last 36 hours     []   [x]   Patient is known to have a history of torsades de pointes; congenital or acquired long QT syndromes.    []   [x]   Patient has received a Class 1 antiarrhythmic with less than 2 half-lives since last dose. (Disopyramide, Quinidine, Procainamide, Lidocaine, Mexiletine, Flecainide, Propafenone)    []   [x]   Patient has received amiodarone therapy in the past 3 months or amiodarone level is greater than 0.3 ng/ml.    Labs:    Component Value Date/Time   K 4.0 07/30/2023 0935   MG 1.8 07/30/2023 0935     Plan: Select One Calculated CrCl  Dose q12h  []  > 60 ml/min 500 mcg  []  40-60 ml/min 250 mcg  [x]  20-40 ml/min 125 mcg   [x]   Physician selected initial dose within range recommended for patients level of renal function - will monitor for response.  []   Physician selected initial dose outside of range recommended for patients level of renal function - will discuss if the dose should be altered at this time.   Patient has been appropriately anticoagulated with Eliquis 2.5 mg BID.  Potassium: K >/= 4: Appropriate to initiate Tikosyn, no replacement needed    Magnesium: Mg 1.8-2: Give Mg 2 gm IV x1 to prevent Mg from dropping below  1.8 - do not need to recheck Mg. Appropriate to initiate Tikosyn   Thank you for allowing pharmacy to participate in this patient's care   Trixie Rude, PharmD Clinical Pharmacist 07/30/2023  11:16 AM

## 2023-07-31 ENCOUNTER — Other Ambulatory Visit (HOSPITAL_COMMUNITY): Payer: Self-pay

## 2023-07-31 DIAGNOSIS — I4819 Other persistent atrial fibrillation: Secondary | ICD-10-CM | POA: Diagnosis not present

## 2023-07-31 LAB — BASIC METABOLIC PANEL
Anion gap: 10 (ref 5–15)
BUN: 31 mg/dL — ABNORMAL HIGH (ref 8–23)
CO2: 26 mmol/L (ref 22–32)
Calcium: 8.5 mg/dL — ABNORMAL LOW (ref 8.9–10.3)
Chloride: 100 mmol/L (ref 98–111)
Creatinine, Ser: 1.61 mg/dL — ABNORMAL HIGH (ref 0.44–1.00)
GFR, Estimated: 32 mL/min — ABNORMAL LOW (ref >60)
Glucose, Bld: 146 mg/dL — ABNORMAL HIGH (ref 70–99)
Potassium: 3.6 mmol/L (ref 3.5–5.1)
Sodium: 136 mmol/L (ref 135–145)

## 2023-07-31 LAB — MAGNESIUM: Magnesium: 1.9 mg/dL (ref 1.7–2.4)

## 2023-07-31 MED ORDER — MAGNESIUM OXIDE -MG SUPPLEMENT 400 (240 MG) MG PO TABS
400.0000 mg | ORAL_TABLET | Freq: Every day | ORAL | Status: DC
  Administered 2023-07-31 – 2023-08-01 (×2): 400 mg via ORAL
  Filled 2023-07-31 (×2): qty 1

## 2023-07-31 MED ORDER — POTASSIUM CHLORIDE CRYS ER 20 MEQ PO TBCR
60.0000 meq | EXTENDED_RELEASE_TABLET | Freq: Once | ORAL | Status: AC
  Administered 2023-07-31: 60 meq via ORAL
  Filled 2023-07-31: qty 3

## 2023-07-31 MED ORDER — MAGNESIUM SULFATE 2 GM/50ML IV SOLN: 2.0000 g | Freq: Once | INTRAVENOUS | Status: DC

## 2023-07-31 MED ORDER — ORAL CARE MOUTH RINSE: 15.0000 mL | OROMUCOSAL | Status: DC | PRN

## 2023-07-31 MED ORDER — MAGNESIUM SULFATE 2 GM/50ML IV SOLN
2.0000 g | Freq: Once | INTRAVENOUS | Status: AC
  Administered 2023-07-31: 2 g via INTRAVENOUS
  Filled 2023-07-31: qty 50

## 2023-07-31 MED ORDER — POTASSIUM CHLORIDE CRYS ER 20 MEQ PO TBCR: 40.0000 meq | EXTENDED_RELEASE_TABLET | Freq: Once | ORAL | Status: DC

## 2023-07-31 MED ORDER — METFORMIN HCL 500 MG PO TABS
500.0000 mg | ORAL_TABLET | Freq: Two times a day (BID) | ORAL | Status: DC
  Administered 2023-07-31 – 2023-08-02 (×3): 500 mg via ORAL
  Filled 2023-07-31 (×3): qty 1

## 2023-07-31 MED ORDER — SODIUM CHLORIDE 0.9 % IV SOLN: INTRAVENOUS | Status: DC

## 2023-07-31 NOTE — Progress Notes (Addendum)
Electrophysiology Rounding Note  Patient Name: Virginia Gilbert Date of Encounter: 07/31/2023  Primary Cardiologist: [name], MD  Electrophysiologist: Hillis Range, MD (Inactive)    Subjective   Pt remains in afib on Tikosyn 125 mcg BID   QTc from EKG last pm shows stable QTc at  The patient is doing well today.  At this time, the patient denies chest pain, shortness of breath, or any new concerns.  Inpatient Medications    Scheduled Meds:  apixaban  2.5 mg Oral BID   carvedilol  6.25 mg Oral BID   cholecalciferol  5,000 Units Oral Q lunch   cyanocobalamin  1,000 mcg Oral Daily   dofetilide  125 mcg Oral BID   ezetimibe  10 mg Oral Daily   furosemide  20 mg Oral BID   glimepiride  4 mg Oral BID   losartan  100 mg Oral Daily   magnesium oxide  400 mg Oral QHS   metFORMIN  1,000 mg Oral BID WC   pravastatin  10 mg Oral QHS   Continuous Infusions:  PRN Meds: acetaminophen, LORazepam, mouth rinse   Vital Signs    Vitals:   07/30/23 1937 07/30/23 2014 07/31/23 0015 07/31/23 0345  BP: (!) 162/80 (!) 160/88 134/66 129/81  Pulse: 90  65 68  Resp: 16  18 16   Temp: 98.1 F (36.7 C)  97.9 F (36.6 C) 97.8 F (36.6 C)  TempSrc: Oral  Oral Oral  SpO2: 96%     Weight:      Height:        Intake/Output Summary (Last 24 hours) at 07/31/2023 0647 Last data filed at 07/31/2023 0346 Gross per 24 hour  Intake 360 ml  Output 2000 ml  Net -1640 ml   Filed Weights   07/30/23 1257  Weight: 62.4 kg    Physical Exam    GEN- NAD, A&O x 3. Normal affect.  Lungs- CTAB, Normal effort.  Heart- Irregularly irregular rate and rhythm. No M/G/R GI- Soft, NT, ND Extremities- No clubbing, cyanosis, or edema Skin- no rash or lesion  Labs    CBC No results for input(s): "WBC", "NEUTROABS", "HGB", "HCT", "MCV", "PLT" in the last 72 hours. Basic Metabolic Panel Recent Labs    16/10/96 0935 07/31/23 0343  NA 134* 136  K 4.0 3.6  CL 97* 100  CO2 24 26   GLUCOSE 195* 146*  BUN 27* 31*  CREATININE 1.59* 1.61*  CALCIUM 9.0 8.5*  MG 1.8 1.9    Telemetry    AF 60-80's, no ectopy noted (personally reviewed)  Patient Profile     Virginia Gilbert is a 82 y.o. female with a past medical history significant for persistent atrial fibrillation.  They were admitted for tikosyn load.   Assessment & Plan    Persistent Atrial Fibrillation Pt remains in afib on Tikosyn 125 mcg BID  Continue Eliquis 2.5mg   Creatinine, ser  1.61* (12/17 0343) Magnesium  1.9 (12/17 0343) Potassium3.6 (12/17 0343) Supplement both K and Mg per protocol  If pt does not convert chemically, plan on DCCV Wednesday    For questions or updates, please contact [facility] Please consult [email or web address] for contact info under Cardiology/STEMI.  Signed, Canary Brim, MSN, APRN, NP-C, AGACNP-BC [facility] - Electrophysiology  07/31/2023, 6:48 AM   I have seen and examined this patient with Canary Brim.  Agree with above, note added to reflect my findings.  Feeling well without complaint  GEN: Well nourished,  well developed, in no acute distress  HEENT: normal  Neck: no JVD, carotid bruits, or masses Cardiac: Irregular; no murmurs, rubs, or gallops,no edema  Respiratory:  clear to auscultation bilaterally, normal work of breathing GI: soft, nontender, nondistended, + BS MS: no deformity or atrophy  Skin: warm and dry Neuro:  Strength and sensation are intact Psych: euthymic mood, full affect   Persistent atrial fibrillation: Has been admitted for dofetilide load.  Remains on 125 mcg twice daily with stable QTc.  Potassium magnesium low and Virginia Gilbert supplement today.  Likely plan for cardioversion tomorrow. Secondary hypercoagulable state: Currently on Eliquis Hypertension: Well-controlled Chronic diastolic heart failure: No obvious volume overload  Virginia Gilbert M. Samaria Anes MD 07/31/2023 9:46 AM

## 2023-07-31 NOTE — Progress Notes (Signed)
Pharmacy: Dofetilide (Tikosyn) - Follow Up Assessment and Electrolyte Replacement  Pharmacy consulted to assist in monitoring and replacing electrolytes in this 82 y.o. female admitted on 07/30/2023 undergoing dofetilide initiation. First dofetilide dose: 125 mcg BID 12/16 PM  Labs:    Component Value Date/Time   K 3.6 07/31/2023 0343   MG 1.9 07/31/2023 0343     Plan: Potassium: K 3.5-3.7:  Give KCl 60 mEq po x1   Magnesium: Mg 1.8-2: Give Mg 2 gm IV x1 - ordered by MD   Thank you for allowing pharmacy to participate in this patient's care   Trixie Rude, PharmD Clinical Pharmacist 07/31/2023  7:04 AM

## 2023-07-31 NOTE — Progress Notes (Signed)
Morning EKG reviewed     Shows pt remains in afib at 75 bpm with  borderline  QTc at 501 ms.  Continue  Tikosyn 125 mcg BID.   Potassium3.6 (12/17 0343) Magnesium  1.9 (12/17 0343) Creatinine, ser  1.61* (12/17 0343)  Pt will be NPO after midnight for DCCV if remains in afib    Informed Consent   Shared Decision Making/Informed Consent The risks (stroke, cardiac arrhythmias rarely resulting in the need for a temporary or permanent pacemaker, skin irritation or burns and complications associated with conscious sedation including aspiration, arrhythmia, respiratory failure and death), benefits (restoration of normal sinus rhythm) and alternatives of a direct current cardioversion were explained in detail to [name] and she agrees to proceed.         Canary Brim, MSN, APRN, NP-C, AGACNP-BC [facility] - Electrophysiology  07/31/2023, 11:40 AM

## 2023-07-31 NOTE — Progress Notes (Signed)
Tikosyn given at 2028 and repeat EKG completed and in chart at 2230.

## 2023-07-31 NOTE — TOC CM/SW Note (Signed)
Transition of Care [facility]) - Inpatient Brief Assessment   Patient Details  Name: NATASSIA HUMES MRN: [id number] Date of Birth: [birth year]  [facility]) CM/SW Contact:    Gala Lewandowsky, RN Phone Number: 07/31/2023, 2:21 PM   Clinical Narrative: Patient presented for Tikosyn Load. Case Manager spoke with the patient regarding co pay cost. Patient is agreeable to cost @ $4.50 and would like to have the initial Rx filled via [facility] and the Rx refills 90 day supply escribed to [facility] in [place]. No further needs identified at this time.   Transition of Care Asessment: Insurance and Status: Insurance coverage has been reviewed Patient has primary care physician: Yes Home environment has been reviewed: Reviewed Prior level of function:: Independent Prior/Current Home Services: No current home services Social Drivers of Health Review: SDOH reviewed no interventions necessary Readmission risk has been reviewed: Yes Transition of care needs: transition of care needs identified, TOC will continue to follow

## 2023-07-31 NOTE — Progress Notes (Signed)
Mobility Specialist Progress Note:   07/31/23 1000  Mobility  Activity Ambulated with assistance in hallway  Level of Assistance Contact guard assist, steadying assist  Assistive Device None  Distance Ambulated (ft) 200 ft  Activity Response Tolerated well  Mobility Referral Yes  Mobility visit 1 Mobility  Mobility Specialist Start Time (ACUTE ONLY) 1013  Mobility Specialist Stop Time (ACUTE ONLY) 1018  Mobility Specialist Time Calculation (min) (ACUTE ONLY) 5 min    Pre Mobility: 80 HR Post Mobility:  79 HR  Pt received in bed, agreeable to mobility. C/o some fatigue, otherwise asymptomatic. Pt left in bed with call bell and all needs met.  D'Vante Earlene Plater Mobility Specialist Please contact via Special educational needs teacher or Rehab office at [phone]

## 2023-08-01 ENCOUNTER — Inpatient Hospital Stay (HOSPITAL_COMMUNITY): Payer: Medicare Other | Admitting: Anesthesiology

## 2023-08-01 ENCOUNTER — Encounter (HOSPITAL_COMMUNITY): Payer: Self-pay | Admitting: Cardiology

## 2023-08-01 ENCOUNTER — Encounter (HOSPITAL_COMMUNITY)
Admission: RE | Disposition: A | Discharge status: Home / Self Care | Payer: Self-pay | Source: Ambulatory Visit | Attending: Cardiology

## 2023-08-01 ENCOUNTER — Other Ambulatory Visit: Payer: Self-pay

## 2023-08-01 DIAGNOSIS — I4819 Other persistent atrial fibrillation: Secondary | ICD-10-CM | POA: Diagnosis not present

## 2023-08-01 DIAGNOSIS — I4891 Unspecified atrial fibrillation: Secondary | ICD-10-CM | POA: Diagnosis not present

## 2023-08-01 HISTORY — PX: CARDIOVERSION: EP1203

## 2023-08-01 LAB — BASIC METABOLIC PANEL
Anion gap: 9 (ref 5–15)
BUN: 29 mg/dL — ABNORMAL HIGH (ref 8–23)
CO2: 26 mmol/L (ref 22–32)
Calcium: 8.5 mg/dL — ABNORMAL LOW (ref 8.9–10.3)
Chloride: 101 mmol/L (ref 98–111)
Creatinine, Ser: 1.57 mg/dL — ABNORMAL HIGH (ref 0.44–1.00)
GFR, Estimated: 33 mL/min — ABNORMAL LOW (ref >60)
Glucose, Bld: 182 mg/dL — ABNORMAL HIGH (ref 70–99)
Potassium: 4.1 mmol/L (ref 3.5–5.1)
Sodium: 136 mmol/L (ref 135–145)

## 2023-08-01 LAB — MAGNESIUM: Magnesium: 2.1 mg/dL (ref 1.7–2.4)

## 2023-08-01 SURGERY — CARDIOVERSION (CATH LAB)
Anesthesia: General

## 2023-08-01 MED ORDER — LIDOCAINE 2% (20 MG/ML) 5 ML SYRINGE
INTRAMUSCULAR | Status: DC | PRN
  Administered 2023-08-01: 20 mg via INTRAVENOUS

## 2023-08-01 MED ORDER — PROPOFOL 10 MG/ML IV BOLUS
INTRAVENOUS | Status: DC | PRN
  Administered 2023-08-01: 50 mg via INTRAVENOUS

## 2023-08-01 SURGICAL SUPPLY — 1 items: PAD DEFIB RADIO PHYSIO CONN (PAD) ×1 IMPLANT

## 2023-08-01 NOTE — CV Procedure (Signed)
DCC: Anesthesia: Propofol On Tikosyn load this admission Rx eliquis no missed doses  Underlying rhythm slow afib rates 48-52 bpm DCC x 1 150J biphasic   Converted to NSR rate 62 Check post conversion ECG on Tikosyn  Charlton Haws MD [facility]

## 2023-08-01 NOTE — Transfer of Care (Signed)
Immediate Anesthesia Transfer of Care Note  Patient: Virginia Gilbert  Procedure(s) Performed: CARDIOVERSION  Patient Location: PACU and Cath Lab  Anesthesia Type:MAC  Level of Consciousness: drowsy and responds to stimulation  Airway & Oxygen Therapy: Patient Spontanous Breathing and Patient connected to nasal cannula oxygen  Post-op Assessment: Report given to RN and Post -op Vital signs reviewed and stable  Post vital signs: Reviewed and stable  Last Vitals:  Vitals Value Taken Time  BP 106/40   Temp    Pulse 53   Resp 20   SpO2 97     Last Pain:  Vitals:   08/01/23 0958  TempSrc: Temporal  PainSc:       Patients Stated Pain Goal: 0 (08/01/23 0800)  Complications: No notable events documented.

## 2023-08-01 NOTE — Progress Notes (Signed)
Pharmacy: Dofetilide (Tikosyn) - Follow Up Assessment and Electrolyte Replacement  Pharmacy consulted to assist in monitoring and replacing electrolytes in this 82 y.o. female admitted on 07/30/2023 undergoing dofetilide initiation. First dofetilide dose: 125 mcg BID 12/16 PM  Labs:    Component Value Date/Time   K 4.1 08/01/2023 0453   MG 2.1 08/01/2023 0453     Plan: Potassium: K >/= 4: No additional supplementation needed  Magnesium: Mg > 2: No additional supplementation needed   Thank you for allowing pharmacy to participate in this patient's care   Fredonia Highland, PharmD, BCPS, [facility] Clinical Pharmacist [phone] Please check AMION for all [facility] Pharmacy numbers 08/01/2023

## 2023-08-01 NOTE — Plan of Care (Signed)
  Problem: Education: Goal: Knowledge of General Education information will improve Description: Including pain rating scale, medication(s)/side effects and non-pharmacologic comfort measures Outcome: Progressing   Problem: Health Behavior/Discharge Planning: Goal: Ability to manage health-related needs will improve Outcome: Progressing   Problem: Clinical Measurements: Goal: Diagnostic test results will improve Outcome: Progressing   Problem: Nutrition: Goal: Adequate nutrition will be maintained Outcome: Completed/Met   Problem: Coping: Goal: Level of anxiety will decrease Outcome: Completed/Met   Problem: Pain Management: Goal: General experience of comfort will improve Outcome: Completed/Met

## 2023-08-01 NOTE — Progress Notes (Addendum)
Electrophysiology Rounding Note  Patient Name: Virginia Gilbert Date of Encounter: 08/01/2023  Primary Cardiologist: [name], MD  Electrophysiologist: Hillis Range, MD (Inactive)    Subjective   Pt remains in afib on Tikosyn 125 mcg BID   QTc from EKG last pm shows stable QTc at 482 ms  The patient is doing well today.  At this time, the patient denies chest pain, shortness of breath, or any new concerns.  Inpatient Medications    Scheduled Meds:  apixaban  2.5 mg Oral BID   carvedilol  6.25 mg Oral BID   cholecalciferol  5,000 Units Oral Q lunch   cyanocobalamin  1,000 mcg Oral Daily   dofetilide  125 mcg Oral BID   ezetimibe  10 mg Oral Daily   furosemide  20 mg Oral BID   glimepiride  4 mg Oral BID   losartan  100 mg Oral Daily   magnesium oxide  400 mg Oral QHS   metFORMIN  500 mg Oral BID WC   pravastatin  10 mg Oral QHS   Continuous Infusions:  sodium chloride     PRN Meds: acetaminophen, LORazepam, mouth rinse   Vital Signs    Vitals:   07/31/23 1146 07/31/23 1703 07/31/23 1934 08/01/23 0414  BP: (!) 140/88 (!) 141/77 (!) 141/72 119/72  Pulse: 62 73 77 70  Resp: 16 18 20 18   Temp: 98.1 F (36.7 C) 98.3 F (36.8 C) 98.9 F (37.2 C) 98.9 F (37.2 C)  TempSrc: Oral Oral Oral Oral  SpO2: 96% 95% 100% 96%  Weight:      Height:        Intake/Output Summary (Last 24 hours) at 08/01/2023 0646 Last data filed at 07/31/2023 1950 Gross per 24 hour  Intake 600 ml  Output 650 ml  Net -50 ml   Filed Weights   07/30/23 1257  Weight: 62.4 kg    Physical Exam    GEN- NAD, A&O x 3. Normal affect.  Lungs- CTAB, Normal effort.  Heart- Irregularly irregular rate and rhythm. No M/G/R GI- Soft, NT, ND Extremities- No clubbing, cyanosis, or edema Skin- no rash or lesion  Labs    CBC No results for input(s): "WBC", "NEUTROABS", "HGB", "HCT", "MCV", "PLT" in the last 72 hours. Basic Metabolic Panel Recent Labs    08/65/78 0343  08/01/23 0453  NA 136 136  K 3.6 4.1  CL 100 101  CO2 26 26  GLUCOSE 146* 182*  BUN 31* 29*  CREATININE 1.61* 1.57*  CALCIUM 8.5* 8.5*  MG 1.9 2.1    Telemetry    AF 60-80's, no PVC's (personally reviewed)  Patient Profile     Virginia Gilbert is a 82 y.o. female with a past medical history significant for persistent atrial fibrillation.  They were admitted for tikosyn load.   Assessment & Plan    Persistent atrial fibrillation Pt remains in afib on Tikosyn 125 mcg BID  Continue Eliquis 2.5mg   Creatinine, ser  1.57* (12/18 0453) Magnesium  2.1 (12/18 0453) Potassium4.1 (12/18 0453) No electrolyte supplementation needed  If pt does not convert chemically, plan on DCCV today    For questions or updates, please contact [facility] Please consult [email or web address] for contact info under Cardiology/STEMI.  Signed, Canary Brim, MSN, APRN, NP-C, AGACNP-BC [facility] - Electrophysiology  08/01/2023, 6:49 AM  I have seen and examined this patient with Canary Brim.  Agree with above, note added to reflect my findings.  Patient feeling  well, resting comfortably in bed.  GEN: Well nourished, well developed, in no acute distress  HEENT: normal  Neck: no JVD, carotid bruits, or masses Cardiac: RRR; no murmurs, rubs, or gallops,no edema  Respiratory:  clear to auscultation bilaterally, normal work of breathing GI: soft, nontender, nondistended, + BS MS: no deformity or atrophy  Skin: warm and dry Neuro:  Strength and sensation are intact Psych: euthymic mood, full affect   Persistent atrial fibrillation: Currently undergoing dofetilide load.  QTc remained stable.  Appears to be in atrial flutter.  Virginia Gilbert plan for cardioversion today.  Continue 125 mcg twice daily. Secondary hypercoagulable take: Continue Eliquis Hypertension: Well-controlled Chronic diastolic heart failure: No obvious volume overload  Virginia Shingledecker M. Stanislaus Kaltenbach MD 08/01/2023 7:57 AM

## 2023-08-01 NOTE — Progress Notes (Signed)
Post Tikosyn EKG shows Qtc 482, Pt remains in Afib. NPO after MN for DCCV tomorrow.

## 2023-08-01 NOTE — Anesthesia Preprocedure Evaluation (Addendum)
Anesthesia Evaluation  Patient identified by MRN, date of birth, ID band Patient awake    Reviewed: Allergy & Precautions, NPO status , Patient's Chart, lab work & pertinent test results, reviewed documented beta blocker date and time   History of Anesthesia Complications Negative for: history of anesthetic complications  Airway Mallampati: I  TM Distance: >3 FB Neck ROM: Full    Dental  (+) Edentulous Upper, Edentulous Lower, Lower Dentures, Upper Dentures, Dental Advisory Given   Pulmonary former smoker   breath sounds clear to auscultation       Cardiovascular hypertension, Pt. on medications and Pt. on home beta blockers (-) angina + dysrhythmias Atrial Fibrillation + Valvular Problems/Murmurs MR  Rhythm:Irregular Rate:Normal  '23 ECHO: EF 56 %.  1.The LV has normal function, no regional wall motion abnormalities.   2. RVF is normal. The right ventricular size is normal. There is normal pulmonary artery systolic pressure.   3. The mitral valve is normal in structure. Trivial MR. No evidence of mitral stenosis. Moderate mitral annular calcification.   4. The aortic valve is normal in structure. Aortic valve regurgitation is not visualized. Aortic valve sclerosis/calcification is present, without any evidence of aortic stenosis.     Neuro/Psych negative neurological ROS     GI/Hepatic negative GI ROS, Neg liver ROS,,,  Endo/Other  diabetes (glu 182), Oral Hypoglycemic Agents    Renal/GU Renal InsufficiencyRenal disease     Musculoskeletal   Abdominal   Peds  Hematology eliquis   Anesthesia Other Findings   Reproductive/Obstetrics                              Anesthesia Physical Anesthesia Plan  ASA: 3  Anesthesia Plan: General   Post-op Pain Management: Minimal or no pain anticipated   Induction: Intravenous  PONV Risk Score and Plan: 3 and Treatment may vary due to age or medical  condition  Airway Management Planned: Nasal Cannula and Natural Airway  Additional Equipment: None  Intra-op Plan:   Post-operative Plan:   Informed Consent: I have reviewed the patients History and Physical, chart, labs and discussed the procedure including the risks, benefits and alternatives for the proposed anesthesia with the patient or authorized representative who has indicated his/her understanding and acceptance.     Dental advisory given  Plan Discussed with: CRNA and Surgeon  Anesthesia Plan Comments:          Anesthesia Quick Evaluation

## 2023-08-01 NOTE — Progress Notes (Signed)
Mobility Specialist Progress Note:   08/01/23 0930  Mobility  Activity Ambulated with assistance in hallway  Level of Assistance Minimal assist, patient does 75% or more  Assistive Device None  Distance Ambulated (ft) 200 ft  Activity Response Tolerated well  Mobility Referral Yes  Mobility visit 1 Mobility  Mobility Specialist Start Time (ACUTE ONLY) 0930  Mobility Specialist Stop Time (ACUTE ONLY) 0940  Mobility Specialist Time Calculation (min) (ACUTE ONLY) 10 min   Pt agreeable to mobility session. Required MinA d/t x1 mild LOB in hallway, otherwise only minG required. C/o generalized fatigue. Pt back in bed with all needs met.   Addison Lank Mobility Specialist Please contact via SecureChat or  Rehab office at [phone]

## 2023-08-01 NOTE — Anesthesia Postprocedure Evaluation (Signed)
Anesthesia Post Note  Patient: Virginia Gilbert  Procedure(s) Performed: CARDIOVERSION     Patient location during evaluation: Cath Lab Anesthesia Type: General Level of consciousness: awake and alert, patient cooperative and oriented Pain management: pain level controlled Vital Signs Assessment: post-procedure vital signs reviewed and stable Respiratory status: spontaneous breathing, nonlabored ventilation and respiratory function stable Cardiovascular status: blood pressure returned to baseline and stable Postop Assessment: no apparent nausea or vomiting Anesthetic complications: no   No notable events documented.  Last Vitals:  Vitals:   08/01/23 1202 08/01/23 1226  BP: 95/75 126/82  Pulse: (!) 53 (!) 52  Resp: 17 18  Temp:  36.7 C  SpO2: 96% 98%    Last Pain:  Vitals:   08/01/23 1226  TempSrc: Oral  PainSc: 0-No pain                 Kyndle Schlender,E. Kutler Vanvranken

## 2023-08-02 ENCOUNTER — Other Ambulatory Visit (HOSPITAL_COMMUNITY): Payer: Self-pay

## 2023-08-02 DIAGNOSIS — I4819 Other persistent atrial fibrillation: Secondary | ICD-10-CM | POA: Diagnosis not present

## 2023-08-02 LAB — BASIC METABOLIC PANEL
Anion gap: 8 (ref 5–15)
BUN: 29 mg/dL — ABNORMAL HIGH (ref 8–23)
CO2: 28 mmol/L (ref 22–32)
Calcium: 8.7 mg/dL — ABNORMAL LOW (ref 8.9–10.3)
Chloride: 101 mmol/L (ref 98–111)
Creatinine, Ser: 1.66 mg/dL — ABNORMAL HIGH (ref 0.44–1.00)
GFR, Estimated: 31 mL/min — ABNORMAL LOW (ref >60)
Glucose, Bld: 144 mg/dL — ABNORMAL HIGH (ref 70–99)
Potassium: 4 mmol/L (ref 3.5–5.1)
Sodium: 137 mmol/L (ref 135–145)

## 2023-08-02 LAB — MAGNESIUM: Magnesium: 2.2 mg/dL (ref 1.7–2.4)

## 2023-08-02 MED ORDER — DOFETILIDE 125 MCG PO CAPS
125.0000 ug | ORAL_CAPSULE | Freq: Two times a day (BID) | ORAL | 11 refills | Status: DC
  Filled 2023-08-02: qty 60, 30d supply, fill #0

## 2023-08-02 MED ORDER — MAGNESIUM OXIDE -MG SUPPLEMENT 400 (240 MG) MG PO TABS
400.0000 mg | ORAL_TABLET | Freq: Every day | ORAL | 11 refills | Status: DC
  Filled 2023-08-02: qty 30, 30d supply, fill #0

## 2023-08-02 MED ORDER — POTASSIUM CHLORIDE CRYS ER 20 MEQ PO TBCR
20.0000 meq | EXTENDED_RELEASE_TABLET | Freq: Every day | ORAL | 11 refills | Status: DC
  Filled 2023-08-02: qty 30, 30d supply, fill #0

## 2023-08-02 NOTE — Discharge Instructions (Signed)
 Dofetilide Capsules What is this medication? DOFETILIDE (doe FET il ide) treats a fast or irregular heartbeat (arrhythmia). It works by slowing down overactive electric signals in the heart, which stabilizes your heart rhythm. It belongs to a group of medications called antiarrhythmics. This medicine may be used for other purposes; ask your health care provider or pharmacist if you have questions. COMMON BRAND NAME(S): Tikosyn What should I tell my care team before I take this medication? They need to know if you have any of these conditions: Heart disease History of irregular heartbeat History of low levels of potassium or magnesium in the blood Kidney disease Liver disease An unusual or allergic reaction to dofetilide, other medications, foods, dyes, or preservatives Pregnant or trying to get pregnant Breast-feeding How should I use this medication? Take this medication by mouth with a glass of water. Follow the directions on the prescription label. Do not take with grapefruit juice. You can take it with or without food. If it upsets your stomach, take it with food. Take your medication at regular intervals. Do not take it more often than directed. Do not stop taking except on your care team's advice. A special MedGuide will be given to you by the pharmacist with each prescription and refill. Be sure to read this information carefully each time. Talk to your care team about the use of this medication in children. Special care may be needed. Overdosage: If you think you have taken too much of this medicine contact a poison control center or emergency room at once. NOTE: This medicine is only for you. Do not share this medicine with others. What if I miss a dose? If you miss a dose, skip it. Take your next dose at the normal time. Do not take extra or 2 doses at the same time to make up for the missed dose. What may interact with this medication? Do not take this medication with any of the  following: Benadryl (Diphenhydramine) Cimetidine Cisapride Dolutegravir Dronedarone Erdafitinib Hydrochlorothiazide Immodium Ketoconazole Megestrol Pimozide Prochlorperazine Thioridazine Trimethoprim Verapamil This medication may also interact with the following: Amiloride Cannabinoids Certain antibiotics like erythromycin or clarithromycin Certain antiviral medications for HIV or hepatitis Certain medications for depression, anxiety, or psychotic disorders Digoxin Diltiazem Grapefruit juice Metformin Nefazodone Other medications that prolong the QT interval (an abnormal heart rhythm) Quinine Triamterene Zafirlukast Ziprasidone This list may not describe all possible interactions. Give your health care provider a list of all the medicines, herbs, non-prescription drugs, or dietary supplements you use. Also tell them if you smoke, drink alcohol, or use illegal drugs. Some items may interact with your medicine. What should I watch for while using this medication? Your condition will be monitored carefully while you are receiving this medication. What side effects may I notice from receiving this medication? Side effects that you should report to your care team as soon as possible: Allergic reactions--skin rash, itching, hives, swelling of the face, lips, tongue, or throat Chest pain Heart rhythm changes--fast or irregular heartbeat, dizziness, feeling faint or lightheaded, chest pain, trouble breathing Side effects that usually do not require medical attention (report to your care team if they continue or are bothersome): Dizziness Headache Nausea Stomach pain Trouble sleeping This list may not describe all possible side effects. Call your doctor for medical advice about side effects. You may report side effects to FDA at 1-800-FDA-1088. Where should I keep my medication? Keep out of the reach of children. Store at room temperature between 15 and 30 degrees  C (59 and 86  degrees F). Throw away any unused medication after the expiration date. NOTE: This sheet is a summary. It may not cover all possible information. If you have questions about this medicine, talk to your doctor, pharmacist, or health care provider.  2024 Elsevier/Gold Standard (2021-07-01 00:00:00)

## 2023-08-02 NOTE — Care Management Important Message (Signed)
Important Message  Patient Details  Name: Virginia Gilbert MRN: [id number] Date of Birth: Nov 02, 1940   Important Message Given:  Yes - Medicare IM     Renie Ora 08/02/2023, 11:19 AM

## 2023-08-02 NOTE — Progress Notes (Signed)
EKG from yesterday evening 08/01/2023 reviewed     Shows is in NSR s/p DCC at 60 bpm with stable QTc at 462 ms.  Continue  Tikosyn 125 mcg BID.   Potassium4.0 (12/19 0354) Magnesium  2.2 (12/19 0354) Creatinine, ser  1.66* (12/19 0354)  Plan for home this afternoon if QTc remains stable.   Canary Brim, MSN, APRN, NP-C, AGACNP-BC [facility] - Electrophysiology  08/02/2023, 8:13 AM

## 2023-08-02 NOTE — Progress Notes (Signed)
Pharmacy: Dofetilide (Tikosyn) - Follow Up Assessment and Electrolyte Replacement  Pharmacy consulted to assist in monitoring and replacing electrolytes in this 82 y.o. female admitted on 07/30/2023 undergoing dofetilide initiation. First dofetilide dose: 08/09/23  Labs:    Component Value Date/Time   K 4.0 08/02/2023 0354   MG 2.2 08/02/2023 0354     Plan: Potassium: K >/= 4: No additional supplementation needed  Magnesium: Mg > 2: No additional supplementation needed   As patient has required on average 20 mEq of potassium replacement every day, recommend discharging patient with prescription for:  Potassium chloride 20 mEq  daily  Thank you for allowing pharmacy to participate in this patient's care   Fredonia Highland, PharmD, BCPS, [facility] Clinical Pharmacist [phone] Please check AMION for all [facility] Pharmacy numbers 08/02/2023

## 2023-08-02 NOTE — Discharge Summary (Addendum)
ELECTROPHYSIOLOGY DISCHARGE SUMMARY    Patient ID: Virginia Gilbert,  MRN: [id number], DOB/AGE: [birth year] 82 y.o.  Admit date: 07/30/2023 Discharge date: 08/02/2023  Primary Care Physician: Irena Reichmann, DO  Primary Cardiologist: [name], MD  Electrophysiologist: Dr. Elberta Fortis   Primary Discharge Diagnosis:  Persistent atrial fibrillation status post Tikosyn loading this admission  Secondary Discharge Diagnosis:  Secondary Hypercoagulable State HTN  Chronic Diastolic CHF   Allergies  Allergen Reactions   Penicillins Anaphylaxis        Tramadol Itching   Codeine Nausea And Vomiting     Procedures This Admission:  1.  Tikosyn loading  2.  Direct current cardioversion on Wednesday August 01, 2023 by Dr Eden Emms (150j biphasic x1) which successfully restored SR.  There were no early apparent complications.   Brief HPI: Virginia Gilbert is a 82 y.o. female with a past medical history as noted above.  They were referred to EP for treatment options of atrial fibrillation.  Risks, benefits, and alternatives to Tikosyn were reviewed with the patient who wished to proceed with admission for loading.  Hospital Course:  The patient was admitted and Tikosyn was initiated.  Renal function and electrolytes were followed during the hospitalization.  Their QTc remained stable. On 08/01/23 they underwent direct current cardioversion which restored sinus rhythm. The patients QTc remained stable. They were monitored on telemetry up to discharge. She noted headache on early am of discharge. On the day of discharge, they were examined by Dr. Elberta Fortis  who considered them stable for discharge to home.  EKG on am of 12/19 showed stable QTc & reviewed by Dr. Elberta Fortis before discharge. Follow-up has been arranged with the Atrial Fibrillation clinic in approximately 1 week.  Tikosyn medication instructions given to patient in detail verbally and written at discharge.   Physical  Exam: Vitals:   08/01/23 1226 08/01/23 1409 08/01/23 2013 08/02/23 0558  BP: 126/82 116/60 137/81   Pulse: (!) 52 68 (!) 55 (!) 57  Resp: 18 17 20 18   Temp: 98 F (36.7 C) 97.9 F (36.6 C) 98.2 F (36.8 C) 98.2 F (36.8 C)  TempSrc: Oral Oral Oral Oral  SpO2: 98%  96%   Weight:      Height:        GEN- NAD, A&O x 3. Normal affect.  Lungs- CTAB, Normal effort.  Heart- Regular rate and rhythm. No M/G/R GI- Soft, NT, ND Extremities- No clubbing, cyanosis, or edema Skin- no rash or lesion  Labs:   Lab Results  Component Value Date   WBC 8.7 04/20/2023   HGB 14.1 04/20/2023   HCT 45.7 04/20/2023   MCV 92 04/20/2023   PLT 264 04/20/2023    Recent Labs  Lab 08/02/23 0354  NA 137  K 4.0  CL 101  CO2 28  BUN 29*  CREATININE 1.66*  CALCIUM 8.7*  GLUCOSE 144*    Discharge Medications:  Allergies as of 08/02/2023       Reactions   Penicillins Anaphylaxis      Tramadol Itching   Codeine Nausea And Vomiting        Medication List     TAKE these medications    acetaminophen 500 MG tablet Commonly known as: TYLENOL Take 1,000 mg by mouth every 8 (eight) hours as needed for moderate pain or headache.   apixaban 2.5 MG Tabs tablet Commonly known as: Eliquis Take 1 tablet (2.5 mg total) by mouth 2 (two) times daily.  carvedilol 6.25 MG tablet Commonly known as: COREG Take 6.25 mg by mouth 2 (two) times daily.   ciprofloxacin 0.3 % ophthalmic solution Commonly known as: CILOXAN Place 1 drop into the left eye See admin instructions. Instill 1 drop into the left eye 4 times daily for 2 days after each monthly eye injection   cyanocobalamin 1000 MCG tablet Commonly known as: VITAMIN B12 Take 1,000 mcg by mouth daily.   dofetilide 125 MCG capsule Commonly known as: TIKOSYN Take 1 capsule (125 mcg total) by mouth 2 (two) times daily.   ezetimibe 10 MG tablet Commonly known as: ZETIA Take 10 mg by mouth daily.   furosemide 20 MG tablet Commonly  known as: Lasix Take 2 tablets (40 mg total) by mouth daily in the mornings and 1 tablet (20 mg total) by mouth daily in the evenings. What changed:  how much to take how to take this when to take this additional instructions   glimepiride 4 MG tablet Commonly known as: AMARYL Take 1 tablet (4 mg total) by mouth daily with breakfast. What changed: when to take this   Jardiance 25 MG Tabs tablet Generic drug: empagliflozin Take 25 mg by mouth daily. Medication on hold since the 14th, due to procedure.   LORazepam 0.5 MG tablet Commonly known as: ATIVAN Take 0.5 mg by mouth daily as needed for anxiety.   losartan 100 MG tablet Commonly known as: COZAAR Take 100 mg by mouth daily.   magnesium oxide 400 (240 Mg) MG tablet Commonly known as: MAG-OX Take 1 tablet (400 mg total) by mouth at bedtime.   magnesium oxide 400 MG tablet Commonly known as: MAG-OX Take 1 tablet (400 mg total) by mouth daily.   metFORMIN 1000 MG tablet Commonly known as: GLUCOPHAGE Take 1,000 mg by mouth 2 (two) times daily with a meal.   potassium chloride SA 20 MEQ tablet Commonly known as: KLOR-CON M Take 1 tablet (20 mEq total) by mouth daily.   pravastatin 10 MG tablet Commonly known as: PRAVACHOL Take 10 mg by mouth daily.   Vitamin D-3 125 MCG (5000 UT) Tabs Take 5,000 Units by mouth daily with lunch.        Disposition:  Home with follow up in AF clinic in 1 week as in AVS.   Duration of Discharge Encounter: Greater than 30 minutes including physician time.  Signed, Canary Brim, MSN, APRN, NP-C, AGACNP-BC [facility] - Electrophysiology  08/02/2023, 12:10 PM     I have seen and examined this patient with Canary Brim.  Agree with above, note added to reflect my findings.  Patient admitted to the hospital with atrial fibrillation.  Patient loaded on dofetilide.  Required cardioversion.  Now in sinus rhythm with acceptable QTc.  Joelie Schou plan for discharge today with  follow-up in clinic.  GEN: Well nourished, well developed, in no acute distress  HEENT: normal  Neck: no JVD, carotid bruits, or masses Cardiac: RRR; no murmurs, rubs, or gallops,no edema  Respiratory:  clear to auscultation bilaterally, normal work of breathing GI: soft, nontender, nondistended, + BS MS: no deformity or atrophy  Skin: warm and dry Neuro:  Strength and sensation are intact Psych: euthymic mood, full affect     Angelys Yetman M. Essynce Munsch MD 08/02/2023 1:47 PM

## 2023-08-02 NOTE — Progress Notes (Signed)
Mobility Specialist Progress Note:    08/02/23 1100  Mobility  Activity Ambulated with assistance in hallway  Level of Assistance Contact guard assist, steadying assist  Assistive Device None  Distance Ambulated (ft) 120 ft  Activity Response Tolerated well  Mobility Referral Yes  Mobility visit 1 Mobility  Mobility Specialist Start Time (ACUTE ONLY) 1133  Mobility Specialist Stop Time (ACUTE ONLY) 1140  Mobility Specialist Time Calculation (min) (ACUTE ONLY) 7 min   Pt received in bed, agreeable to mobility. C/o a headache earlier and some fatigue. No complaints throughout ambulation. Pt left in bed with call bell and all needs met.  D'Vante Earlene Plater Mobility Specialist Please contact via Special educational needs teacher or Rehab office at [phone]

## 2023-08-03 ENCOUNTER — Telehealth: Payer: Self-pay

## 2023-08-03 NOTE — Transitions of Care (Post Inpatient/ED Visit) (Signed)
08/03/2023  Name: Virginia Gilbert MRN: [id number] DOB: Oct 09, 1940  Today's TOC FU Call Status: Today's TOC FU Call Status:: Successful TOC FU Call Completed TOC FU Call Complete Date: 08/03/23 Patient's Name and Date of Birth confirmed.  Transition Care Management Follow-up Telephone Call Date of Discharge: 08/02/23 Discharge Facility: [facility]) Type of Discharge: Inpatient Admission Primary Inpatient Discharge Diagnosis:: Persistent atrial fibrillation How have you been since you were released from the hospital?: Better Any questions or concerns?: No  Items Reviewed: Did you receive and understand the discharge instructions provided?: Yes Medications obtained,verified, and reconciled?: Yes (Medications Reviewed) Any new allergies since your discharge?: No Dietary orders reviewed?: Yes Type of Diet Ordered:: Reg Heart Healthy Do you have support at home?: Yes People in Home: child(ren), adult Name of Support/Comfort Primary Source: Daughter  and SIL lives with her  Medications Reviewed Today: Medications Reviewed Today     Reviewed by Johnnette Barrios, RN (Registered Nurse) on 08/03/23 at 1206  Med List Status: <None>   Medication Order Taking? Sig Documenting Provider Last Dose Status Informant  acetaminophen (TYLENOL) 500 MG tablet [id number] Yes Take 1,000 mg by mouth every 8 (eight) hours as needed for moderate pain or headache. [provider] Taking Active Self  apixaban (ELIQUIS) 2.5 MG TABS tablet [id number] Yes Take 1 tablet (2.5 mg total) by mouth 2 (two) times daily. Fenton, Clint R, PA Taking Active Self  carvedilol (COREG) 6.25 MG tablet [id number] Yes Take 6.25 mg by mouth 2 (two) times daily. [provider] Taking Active Self  Cholecalciferol (VITAMIN D-3) 125 MCG (5000 UT) TABS [id number] Yes Take 5,000 Units by mouth daily with lunch.  [provider] Taking Active Self  ciprofloxacin (CILOXAN) 0.3 % ophthalmic solution [id number]  Yes Place 1 drop into the left eye See admin instructions. Instill 1 drop into the left eye 4 times daily for 2 days after each monthly eye injection [provider] Taking Active Self  cyanocobalamin (VITAMIN B12) 1000 MCG tablet [id number] Yes Take 1,000 mcg by mouth daily. [provider] Taking Active Self  dofetilide (TIKOSYN) 125 MCG capsule [id number] Yes Take 1 capsule (125 mcg total) by mouth 2 (two) times daily. Jeanella Craze, NP Taking Active   empagliflozin (JARDIANCE) 25 MG TABS tablet [id number] Yes Take 25 mg by mouth daily. Medication on hold since the 14th, due to procedure. [provider] Taking Active Self  ezetimibe (ZETIA) 10 MG tablet [id number] Yes Take 10 mg by mouth daily. [provider] Taking Active Self  furosemide (LASIX) 20 MG tablet [id number]  Take 2 tablets (40 mg total) by mouth daily in the mornings and 1 tablet (20 mg total) by mouth daily in the evenings.  Patient taking differently: Take 20 mg by mouth daily.   [name], MD  Active Self           Med Note Sharon Seller, Tamora Huneke L   Fri Aug 03, 2023 12:02 PM) 20 mg BID  glimepiride (AMARYL) 4 MG tablet [id number] Yes Take 1 tablet (4 mg total) by mouth daily with breakfast.  Patient taking differently: Take 4 mg by mouth 2 (two) times daily.   Lorin Glass, MD Taking Active Self           Med Note Hollice Espy, EMMA U   Mon Jul 30, 2023 11:22 AM) Taking BID  LORazepam (ATIVAN) 0.5 MG tablet [id number] Yes Take 0.5 mg by mouth daily as needed for anxiety. [provider] Taking  Active Self  losartan (COZAAR) 100 MG tablet [id number] Yes Take 100 mg by mouth daily. [provider] Taking Active Self           Med Note Tiburcio Pea, Harland German Jun 27, 2019 12:31 PM)    magnesium oxide (MAG-OX) 400 (240 Mg) MG tablet [id number] Yes Take 1 tablet (400 mg total) by mouth at bedtime. Jeanella Craze, NP Taking Active   magnesium oxide (MAG-OX) 400 MG tablet [id number]   Take 1 tablet (400 mg total) by mouth daily. Fenton, [place] R, [place]  Active Self           Med Note Sharon Seller, Christabel Camire L   Fri Aug 03, 2023 12:06 PM) duplicate  metFORMIN (GLUCOPHAGE) 1000 MG tablet [id number] Yes Take 1,000 mg by mouth 2 (two) times daily with a meal.  [provider] Taking Active Self           Med Note Halford Decamp   Fri Jun 27, 2019 12:31 PM)    potassium chloride SA (KLOR-CON M) 20 MEQ tablet [id number] Yes Take 1 tablet (20 mEq total) by mouth daily. Jeanella Craze, NP Taking Active   pravastatin (PRAVACHOL) 10 MG tablet [id number] Yes Take 10 mg by mouth daily. [provider] Taking Active Self          Medication reconciliation/ review completed; confirmed patient is taking all newly prescribed medications as instructed; Aware of changes to previous medications and dosage adjustments. Patient denies questions/ concerns around medications today.   Home Care and Equipment/Supplies: Were Home Health Services Ordered?: No Any new equipment or medical supplies ordered?: No  Functional Questionnaire: Do you need assistance with bathing/showering or dressing?: No Do you need assistance with meal preparation?: No Do you need assistance with eating?: No Do you have difficulty maintaining continence: No Do you need assistance with getting out of bed/getting out of a chair/moving?: No Do you have difficulty managing or taking your medications?: No  Follow up appointments reviewed: PCP Follow-up appointment confirmed?: Yes Date of PCP follow-up appointment?: 08/29/23 Follow-up Provider: Linus Galas Specialist [facility] Follow-up appointment confirmed?: Yes Date of Specialist follow-up appointment?: 08/10/23 Follow-Up Specialty Provider:: Cardiology 12/27 Do you need transportation to your follow-up appointment?: No (Family Transport) Do you understand care options if your condition(s) worsen?: Yes-patient verbalized understanding  SDOH Interventions  Today    Flowsheet Row Most Recent Value  SDOH Interventions   Food Insecurity Interventions Intervention Not Indicated  Housing Interventions Intervention Not Indicated  Transportation Interventions Intervention Not Indicated  Utilities Interventions Intervention Not Indicated       Goals Addressed             This Visit's Progress    TOC Care Plan       Current Barriers:  Chronic Disease Management support and education needs related to Atrial Fibrillation   RNCM Clinical Goal(s):  Patient will work with the Care Management team over the next 30 days to address Transition of Care Barriers: Medication Management Provider appointments take all medications exactly as prescribed and will call provider for medication related questions as evidenced by no missed medications no unmanaged side effects attend all scheduled medical appointments: with PCP and Cardiology as evidenced by no missed appointments  continue to work with RN Care Manager to address care management and care coordination needs related to  Atrial Fibrillation as evidenced by adherence to CM Team Scheduled appointments through collaboration with RN Care manager, provider, and care team.  Interventions: Evaluation of current treatment plan related to  self management and patient's adherence to plan as established by provider  Transitions of Care:  New goal. Doctor Visits  - discussed the importance of doctor visits  AFIB Interventions: (Status:  New goal.) Short Term Goal   Counseled on increased risk of stroke due to Afib and benefits of anticoagulation for stroke prevention Reviewed importance of adherence to anticoagulant exactly as prescribed Counseled on bleeding risk associated with Eliquis  and importance of self-monitoring for signs/symptoms of bleeding Counseled on seeking medical attention after a head injury or if there is blood in the urine/stool Assessed social determinant of health barriers  Patient  Goals/Self-Care Activities: Participate in Transition of Care Program/Attend TOC scheduled calls Take all medications as prescribed Attend all scheduled provider appointments Call pharmacy for medication refills 3-7 days in advance of running out of medications Perform all self care activities independently  Perform IADL's (shopping, preparing meals, housekeeping, managing finances) independently Call provider office for new concerns or questions   Follow Up Plan:  Telephone follow up appointment with care management team member scheduled for:  12/26 @ 2:30pm with Wyline Mood, RN The patient has been provided with contact information for the care management team and has been advised to call with any health related questions or concerns.        Discussed VBCI  TOC program and weekly calls to patient to assess condition/status, medication management  and provide support/education as indicated . Patient/ Caregiver voiced understanding and is  agreeable to 30 day program     Susa Loffler , Scientist, research (physical sciences), RN Care Management Coordinator [place]   [facility] [email or web address] Direct Dial: [phone]

## 2023-08-03 NOTE — Patient Instructions (Signed)
Visit Information  Thank you for taking time to visit with me today. Please don't hesitate to contact me if I can be of assistance to you before our next scheduled telephone appointment.  Our next appointment is by telephone on 07/20/23  at 2:30pm  Following is a copy of your care plan:   Goals Addressed             This Visit's Progress    TOC Care Plan       Current Barriers:  Chronic Disease Management support and education needs related to Atrial Fibrillation   RNCM Clinical Goal(s):  Patient will work with the Care Management team over the next 30 days to address Transition of Care Barriers: Medication Management Provider appointments take all medications exactly as prescribed and will call provider for medication related questions as evidenced by no missed medications no unmanaged side effects attend all scheduled medical appointments: with PCP and Cardiology as evidenced by no missed appointments  continue to work with RN Care Manager to address care management and care coordination needs related to  Atrial Fibrillation as evidenced by adherence to CM Team Scheduled appointments through collaboration with RN Care manager, provider, and care team.   Interventions: Evaluation of current treatment plan related to  self management and patient's adherence to plan as established by provider  Transitions of Care:  New goal. Doctor Visits  - discussed the importance of doctor visits  AFIB Interventions: (Status:  New goal.) Short Term Goal   Counseled on increased risk of stroke due to Afib and benefits of anticoagulation for stroke prevention Reviewed importance of adherence to anticoagulant exactly as prescribed Counseled on bleeding risk associated with Eliquis  and importance of self-monitoring for signs/symptoms of bleeding Counseled on seeking medical attention after a head injury or if there is blood in the urine/stool Assessed social determinant of health barriers  Patient  Goals/Self-Care Activities: Participate in Transition of Care Program/Attend TOC scheduled calls Take all medications as prescribed Attend all scheduled provider appointments Call pharmacy for medication refills 3-7 days in advance of running out of medications Perform all self care activities independently  Perform IADL's (shopping, preparing meals, housekeeping, managing finances) independently Call provider office for new concerns or questions   Follow Up Plan:  Telephone follow up appointment with care management team member scheduled for:  12/26 @ 2:30pm with Wyline Mood, RN The patient has been provided with contact information for the care management team and has been advised to call with any health related questions or concerns.          Reviewed goals for care Patient/ Caregiver  verbalizes understanding of instructions and care plan provided. Patient / Caregiver was encouraged to make informed decisions about care, actively participate in managing health conditions, and implement lifestyle changes as needed to promote independence and self-management of their medical care   VBCI Case Management Nurse will provide follow-up and on-going assessment ,evaluation and education of disease processes, recommended interventions for both chronic and acute medical conditions ,  along with ongoing review of symptoms ,medication reviews / reconciliation during each weekly call . Any updates , inconsistencies, discrepancies or acute care concerns will be addressed and routed to the correct Practitioner if indicated    Please call the care guide team at [phone]  if you need to cancel or reschedule your appointment . For scheduled calls -Three attempts will be made to reach you if the scheduled call is missed or  we are unable to  reach the you  after 3 attempts the case will be closed and no additional outreach attempts will be made.   If you need to speak to a Nurse you may  call me  directly at the number below or if I am unavailable,and  your need is urgent  please call the main VBCI number at [phone] and ask to speak with one of the [facility] ( Transition of Care )  Nurses  .     Additionally, If you experience worsening of your symptoms, develop shortness of breath, If you are experiencing a medical emergency,  develop suicidal or homicidal thoughts you must seek medical attention immediately by calling 911 or report to your local emergency department or urgent care.   If you have a non-emergency medical problem during routine business hours, please contact your provider's office and ask to speak with a nurse.       Please take the time to read instructions/literature along with the possible adverse reactions/side effects for all the Medicines that have been prescribed to you. Only take newly prescribed  Medications after you have completely understood and accept all the possible adverse reactions/side effects.   Do not take more than prescribed Medications for  Pain, Sleep and Anxiety. Do not drive when taking Pain medications or sleep aid/ insomnia  medications It is not advisable to combine anxiety, sleep and pain medications without talking with your primary care practitioner    If you are experiencing a Mental Health or Behavioral Health Crisis or need someone to talk to Please call the Suicide and Crisis Lifeline: 988 You may also call the [place] National Suicide Prevention Lifeline: (479)471-7432 or TTY: (579)132-5942 TTY 928 013 5708) to talk to a trained counselor.  You may call the Behavioral Health Crisis Line at (224) 127-2423, at any time, 24 hours a day, 7 days a week- however If you are in danger or need immediate medical attention, call 911.   If you would like help to quit smoking, call 1-800-QUIT-NOW ( 234-712-6610) OR Espaol: 1-855-Djelo-Ya (4-742-595-6387) o para ms informacin haga clic aqu or Text READY to 564-332 to register via  text.   Susa Loffler , BSN, RN Care Management Coordinator [place]   [facility] [email or web address] Direct Dial: [phone]

## 2023-08-09 ENCOUNTER — Other Ambulatory Visit: Payer: Medicare Other

## 2023-08-10 ENCOUNTER — Ambulatory Visit (HOSPITAL_COMMUNITY)
Admit: 2023-08-10 | Discharge: 2023-08-10 | Disposition: A | Discharge status: Home / Self Care | Payer: Medicare Other | Source: Ambulatory Visit | Attending: Internal Medicine | Admitting: Internal Medicine

## 2023-08-10 ENCOUNTER — Encounter (HOSPITAL_COMMUNITY): Payer: Self-pay | Admitting: Internal Medicine

## 2023-08-10 VITALS — BP 146/70 | HR 56 | Ht 59.0 in | Wt 141.4 lb

## 2023-08-10 DIAGNOSIS — Z5181 Encounter for therapeutic drug level monitoring: Secondary | ICD-10-CM | POA: Insufficient documentation

## 2023-08-10 DIAGNOSIS — I110 Hypertensive heart disease with heart failure: Secondary | ICD-10-CM | POA: Diagnosis not present

## 2023-08-10 DIAGNOSIS — Z79899 Other long term (current) drug therapy: Secondary | ICD-10-CM | POA: Diagnosis not present

## 2023-08-10 DIAGNOSIS — I5032 Chronic diastolic (congestive) heart failure: Secondary | ICD-10-CM | POA: Insufficient documentation

## 2023-08-10 DIAGNOSIS — E119 Type 2 diabetes mellitus without complications: Secondary | ICD-10-CM | POA: Diagnosis not present

## 2023-08-10 DIAGNOSIS — Z7901 Long term (current) use of anticoagulants: Secondary | ICD-10-CM | POA: Insufficient documentation

## 2023-08-10 DIAGNOSIS — E785 Hyperlipidemia, unspecified: Secondary | ICD-10-CM | POA: Diagnosis not present

## 2023-08-10 DIAGNOSIS — I4819 Other persistent atrial fibrillation: Secondary | ICD-10-CM | POA: Diagnosis not present

## 2023-08-10 DIAGNOSIS — D6869 Other thrombophilia: Secondary | ICD-10-CM | POA: Diagnosis not present

## 2023-08-10 DIAGNOSIS — I4510 Unspecified right bundle-branch block: Secondary | ICD-10-CM | POA: Diagnosis not present

## 2023-08-10 DIAGNOSIS — Z7984 Long term (current) use of oral hypoglycemic drugs: Secondary | ICD-10-CM | POA: Diagnosis not present

## 2023-08-10 LAB — BASIC METABOLIC PANEL
Anion gap: 9 (ref 5–15)
BUN: 31 mg/dL — ABNORMAL HIGH (ref 8–23)
CO2: 25 mmol/L (ref 22–32)
Calcium: 9 mg/dL (ref 8.9–10.3)
Chloride: 103 mmol/L (ref 98–111)
Creatinine, Ser: 1.65 mg/dL — ABNORMAL HIGH (ref 0.44–1.00)
GFR, Estimated: 31 mL/min — ABNORMAL LOW (ref >60)
Glucose, Bld: 161 mg/dL — ABNORMAL HIGH (ref 70–99)
Potassium: 4.9 mmol/L (ref 3.5–5.1)
Sodium: 137 mmol/L (ref 135–145)

## 2023-08-10 LAB — MAGNESIUM: Magnesium: 2.1 mg/dL (ref 1.7–2.4)

## 2023-08-10 MED ORDER — MAGNESIUM OXIDE -MG SUPPLEMENT 400 (240 MG) MG PO TABS: 400.0000 mg | ORAL_TABLET | Freq: Every day | ORAL | 1 refills | Status: AC

## 2023-08-10 MED ORDER — POTASSIUM CHLORIDE CRYS ER 20 MEQ PO TBCR: 20.0000 meq | EXTENDED_RELEASE_TABLET | Freq: Every day | ORAL | 1 refills | Status: DC

## 2023-08-10 MED ORDER — DOFETILIDE 125 MCG PO CAPS: 125.0000 ug | ORAL_CAPSULE | Freq: Two times a day (BID) | ORAL | 1 refills | Status: DC

## 2023-08-10 NOTE — Progress Notes (Signed)
Primary Care Physician: Irena Reichmann, DO Primary Cardiologist: Dr Jacques Navy Primary Electrophysiologist: Dr Elberta Fortis  Referring Physician: Dr Albertina Parr Virginia Gilbert is a 82 y.o. female with a history of DM, HLD, HTN, and persistent atrial fibrillation who presents for follow up in the [facility]. The patient was initially diagnosed with atrial fibrillation 05/2019 after presenting with symptoms of worsening dyspnea on exertion. Patient is on Eliquis for a CHADS2VASC score of 5. There were no specific triggers that the patient could identify. She underwent DCCV on 07/08/19 but had ERAF. She was loaded on amiodarone and had DCCV again on 09/02/19 but unfortunately she was back in afib again. She denies snoring or alcohol use. Patient is s/p afib ablation with Dr Johney Frame on 12/02/19. She was back in afib on her follow up 01/05/20 and had another DCCV on 01/13/20. Her amiodarone was later discontinued.   She was seen by Dr Jacques Navy 04/20/23 and found to be in persistent afib. Thought to be precipitated by her COVID 19 infection a couple months previous. She underwent DCCV on 05/16/23 but was back out of rhythm on follow up 10/18. Started back on amiodarone 06/11/23 but had to discontinue due to intolerable side effects (nausea, weakness).   On follow up today, patient presents for dofetilide admission. She denies any missed doses of anticoagulation in the past 3 weeks. She remains fatigued despite rate control.   On follow up 08/10/23, she is currently in NSR. S/p Tikosyn admission 12/16-19. S/p successful DCCV on 12/18. She is on Tikosyn 125 mcg BID. She has had no episodes of Afib since discharge. She maybe feels a little tired since discharge. No missed doses of Tikosyn or Eliquis 2.5 mg BID.   Today, she denies symptoms of palpitations, chest pain, orthopnea, PND, dizziness, presyncope, syncope, snoring, daytime somnolence, bleeding, or neurologic sequela. The patient is  tolerating medications without difficulties and is otherwise without complaint today.    Atrial Fibrillation Risk Factors:  she does not have symptoms or diagnosis of sleep apnea. she does not have a history of rheumatic fever. she does not have a history of alcohol use. The patient does not have a history of early familial atrial fibrillation or other arrhythmias.   Atrial Fibrillation Management history:  Previous antiarrhythmic drugs: amiodarone, tikosyn 125 Previous cardioversions: 07/08/19, 09/02/19, 05/16/23, 08/01/23 Previous ablations: 12/02/19 Anticoagulation history: Eliquis   Past Medical History:  Diagnosis Date   Diabetes mellitus without complication (HCC)    Hyperlipidemia    Hypertension    Mitral regurgitation    evaluated by TEE 09/2019   Persistent atrial fibrillation (HCC)     Current Outpatient Medications  Medication Sig Dispense Refill   acetaminophen (TYLENOL) 500 MG tablet Take 1,000 mg by mouth every 8 (eight) hours as needed for moderate pain or headache.     apixaban (ELIQUIS) 2.5 MG TABS tablet Take 1 tablet (2.5 mg total) by mouth 2 (two) times daily. 180 tablet 1   carvedilol (COREG) 6.25 MG tablet Take 6.25 mg by mouth 2 (two) times daily.     Cholecalciferol (VITAMIN D-3) 125 MCG (5000 UT) TABS Take 5,000 Units by mouth daily with lunch.      ciprofloxacin (CILOXAN) 0.3 % ophthalmic solution Place 1 drop into the left eye See admin instructions. Instill 1 drop into the left eye 4 times daily for 2 days after each monthly eye injection     cyanocobalamin (VITAMIN B12) 1000 MCG tablet Take 1,000 mcg  by mouth daily.     dofetilide (TIKOSYN) 125 MCG capsule Take 1 capsule (125 mcg total) by mouth 2 (two) times daily. 60 capsule 11   empagliflozin (JARDIANCE) 25 MG TABS tablet Take 25 mg by mouth daily. Medication on hold since the 14th, due to procedure.     ezetimibe (ZETIA) 10 MG tablet Take 10 mg by mouth daily.     furosemide (LASIX) 20 MG tablet  Take 2 tablets (40 mg total) by mouth daily in the mornings and 1 tablet (20 mg total) by mouth daily in the evenings. (Patient taking differently: Take 20 mg by mouth daily.) 360 tablet 3   glimepiride (AMARYL) 4 MG tablet Take 1 tablet (4 mg total) by mouth daily with breakfast. (Patient taking differently: Take 4 mg by mouth 2 (two) times daily.)  1   LORazepam (ATIVAN) 0.5 MG tablet Take 0.5 mg by mouth daily as needed for anxiety.     losartan (COZAAR) 100 MG tablet Take 100 mg by mouth daily.  6   magnesium oxide (MAG-OX) 400 (240 Mg) MG tablet Take 1 tablet (400 mg total) by mouth at bedtime. 30 tablet 11   magnesium oxide (MAG-OX) 400 MG tablet Take 1 tablet (400 mg total) by mouth daily. 30 tablet 3   metFORMIN (GLUCOPHAGE) 1000 MG tablet Take 1,000 mg by mouth 2 (two) times daily with a meal.   6   potassium chloride SA (KLOR-CON M) 20 MEQ tablet Take 1 tablet (20 mEq total) by mouth daily. 30 tablet 11   pravastatin (PRAVACHOL) 10 MG tablet Take 10 mg by mouth daily.     No current facility-administered medications for this encounter.    ROS- All systems are reviewed and negative except as per the HPI above.  Physical Exam: Vitals:   08/10/23 1125  BP: (!) 146/70  Pulse: (!) 56  Weight: 64.1 kg  Height: 4\' 11"  (1.499 m)    GEN- The patient is well appearing, alert and oriented x 3 today.   Neck - no JVD or carotid bruit noted Lungs- Clear to ausculation bilaterally, normal work of breathing Heart- Regular rate and rhythm, no murmurs, rubs or gallops, PMI not laterally displaced Extremities- no clubbing, cyanosis, or edema Skin - no rash or ecchymosis noted   Wt Readings from Last 3 Encounters:  08/10/23 64.1 kg  07/30/23 62.4 kg  07/30/23 64.7 kg    EKG today demonstrates  Vent. rate 56 BPM PR interval 150 ms QRS duration 112 ms QT/QTcB 474/457 ms P-R-T axes 35 70 -36 Sinus bradycardia Right bundle branch block Septal infarct , age undetermined T wave  abnormality, consider inferolateral ischemia Abnormal ECG When compared with ECG of 02-Aug-2023 10:51, PREVIOUS ECG IS PRESENT  Echo 10/21/21 demonstrated   1. Left ventricular ejection fraction by 3D volume is 56 %. The left  ventricle has normal function. The left ventricle has no regional wall  motion abnormalities. Left ventricular diastolic parameters are  indeterminate.   2. Right ventricular systolic function is normal. The right ventricular  size is normal. There is normal pulmonary artery systolic pressure.   3. The mitral valve is normal in structure. Trivial mitral valve  regurgitation. No evidence of mitral stenosis. Moderate mitral annular  calcification.   4. The aortic valve is normal in structure. Aortic valve regurgitation is  not visualized. Aortic valve sclerosis/calcification is present, without  any evidence of aortic stenosis.   5. The inferior vena cava is normal in size with greater  than 50%  respiratory variability, suggesting right atrial pressure of 3 mmHg.   Epic records are reviewed at length today  CHA2DS2-VASc Score = 6  The patient's score is based upon: CHF History: 1 HTN History: 1 Diabetes History: 1 Stroke History: 0 Vascular Disease History: 0 Age Score: 2 Gender Score: 1       ASSESSMENT AND PLAN: Persistent Atrial Fibrillation/atrial flutter The patient's CHA2DS2-VASc score is 6, indicating a 9.7% annual risk of stroke.   S/p ablation 12/02/19 S/p DCCV 05/16/23 with quick return of afib Failed amiodarone due to intolerable side effects.  S/p dofetilide admission 12/16-19/24.  She is currently in NSR. Qtc stable. Continue Tikosyn 125 mcg BID. Bmet and mag drawn today.   Secondary Hypercoagulable State (ICD10:  D68.69) The patient is at significant risk for stroke/thromboembolism based upon her CHA2DS2-VASc Score of 6.  Continue Apixaban (Eliquis).  Continue Eliquis 2.5 mg BID; dosage correct based on age and creatinine > 1.5.    HTN Stable, continue to monitor at home.  Chronic HFpEF Fluid status appears stable   Follow up in 1 month for Tikosyn surveillance as scheduled with Dr. Elberta Fortis (also to talk about repeat ablation).    Justin Mend, PA-C Afib Clinic [facility] [address] [place], [place] [address] [phone] 08/10/2023 11:41 AM

## 2023-08-16 ENCOUNTER — Telehealth: Payer: Self-pay

## 2023-08-16 NOTE — Patient Outreach (Signed)
 Care Management  Transitions of Care Program Transitions of Care Post-discharge week 2   08/16/2023 Name: Virginia Gilbert MRN: [id number] DOB: [birth year]  Subjective: Virginia Gilbert is a 83 y.o. year old female who is a primary care patient of Gerome Brunet, DO. The Care Management team  Engaged with patient by telephone to assess and address transitions of care needs.   Consent to Services:  Patient was given information about care management services, agreed to services, and gave verbal consent to participate.   Assessment:   Patient reports that she is progressively improving and getting back to her baseline.  She denies any irregular heartbeat, palpitations, dizziness/lightheadedness, chest pressure/pain or shortness of breath.  Patient reports she is 100% compliant with her medications and attending her doctor appointments.  Her next PCP appointment is scheduled for 08/22/2023 and Cardiology appointment is 09/11/23.  Her daughter takes her to her doctor's appointments but states she could drive herself as well, if needed.  No acute or unmet needs identified today.  Patient verbalized understanding of all verbal patient education provided today.       SDOH Interventions    Flowsheet Row Telephone from 08/03/2023 in [place] POPULATION HEALTH DEPARTMENT  SDOH Interventions   Food Insecurity Interventions Intervention Not Indicated  Housing Interventions Intervention Not Indicated  Transportation Interventions Intervention Not Indicated  Utilities Interventions Intervention Not Indicated        Goals Addressed             This Visit's Progress    TOC Care Plan       Current Barriers:  Chronic Disease Management support and education needs related to Atrial Fibrillation   RNCM Clinical Goal(s):  Patient will work with the Care Management team over the next 30 days to address Transition of Care Barriers: Medication Management Provider appointments take all medications  exactly as prescribed and will call provider for medication related questions as evidenced by no missed medications no unmanaged side effects attend all scheduled medical appointments: with PCP and Cardiology as evidenced by no missed appointments upon EMR review. continue to work with RN Care Manager to address care management and care coordination needs related to  Atrial Fibrillation as evidenced by adherence to CM Team Scheduled appointments through collaboration with RN Care manager, provider, and care team.   Interventions: Evaluation of current treatment plan related to  self management and patient's adherence to plan as established by provider. Medications reviewed including therapeutic benefits in managing her medical conditions and potential side effects.  Patient verbalizes 100% adherence with medication regime. Reviewed patients blood sugar and blood pressure readings with her.  Discussed with patient taking her logs with blood pressure, weight and blood sugar readings with her upcoming doctor appointments.  Patient expressed she would take them to share at her next appointments.  Transitions of Care:  Goal on track:  Yes. Upcoming doctor appointments reviewed:  PCP - 08/22/23, Cardiology - 09/11/23  AFIB Interventions: (Status:  Goal on track:  Yes.) Short Term Goal    Patient education on the signs and symptoms to report and/or seek immediate medical attention include:  irregular heartbeat, palpitations, pounding, chest pain, shortness of breath or dizziness.  Patient verbalized understanding.   -Discussed/reviewed heart healthy diet and making healthy food choices.  Patient denies any barriers to selecting healthy choice foods and verbalizes good understanding.   Patient Goals/Self-Care Activities: Participate in Transition of Care Program/Attend TOC scheduled calls Take all medications as prescribed Attend all scheduled  provider appointments Call provider office for new concerns or  questions   Follow Up Plan:  Telephone follow up appointment with care management team member scheduled for:  08/24/23 at 10 AM.   The patient has been provided with contact information for the care management team and has been advised to call with any health related questions or concerns.    Lydiah Pong Gladis BSN, RN RN Care Manager   Transitions of Care [facility]BCI - [facility] [place] Direct Dial Number:  [phone]         Plan: Telephone follow up appointment with care management team member scheduled for: 08/24/23 at 10 AM.  Pasco Gladis BSN, RN RN Care Manager   Transitions of Care [facility] - [place] [facility] Direct Dial Number:  [phone]

## 2023-08-16 NOTE — Patient Instructions (Signed)
 Visit Information  Thank you for taking time to visit with me today. Please don't hesitate to contact me if I can be of assistance to you before our next scheduled telephone appointment.  Our next appointment is by telephone on 08/24/23 at 10 AM.  Following is a copy of your care plan:   Goals Addressed             This Visit's Progress    TOC Care Plan       Current Barriers:  Chronic Disease Management support and education needs related to Atrial Fibrillation   RNCM Clinical Goal(s):  Patient will work with the Care Management team over the next 30 days to address Transition of Care Barriers: Medication Management Provider appointments take all medications exactly as prescribed and will call provider for medication related questions as evidenced by no missed medications no unmanaged side effects attend all scheduled medical appointments: with PCP and Cardiology as evidenced by no missed appointments upon EMR review. continue to work with RN Care Manager to address care management and care coordination needs related to  Atrial Fibrillation as evidenced by adherence to CM Team Scheduled appointments through collaboration with RN Care manager, provider, and care team.   Interventions: Evaluation of current treatment plan related to  self management and patient's adherence to plan as established by provider. Medications reviewed including therapeutic benefits in managing her medical conditions and potential side effects.  Patient verbalizes 100% adherence with medication regime. Reviewed patients blood sugar and blood pressure readings with her.  Discussed with patient taking her logs with blood pressure, weight and blood sugar readings with her upcoming doctor appointments.  Patient expressed she would take them to share at her next appointments.  Transitions of Care:  Goal on track:  Yes. Upcoming doctor appointments reviewed:  PCP - 08/22/23, Cardiology - 09/11/23  AFIB Interventions:  (Status:  Goal on track:  Yes.) Short Term Goal    Patient education on the signs and symptoms to report and/or seek immediate medical attention include:  irregular heartbeat, palpitations, pounding, chest pain, shortness of breath or dizziness.  Patient verbalized understanding.   -Discussed/reviewed heart healthy diet and making healthy food choices.  Patient denies any barriers to selecting healthy choice foods and verbalizes good understanding.   Patient Goals/Self-Care Activities: Participate in Transition of Care Program/Attend TOC scheduled calls Take all medications as prescribed Attend all scheduled provider appointments Call provider office for new concerns or questions   Follow Up Plan:  Telephone follow up appointment with care management team member scheduled for:  08/24/23 at 10 AM.   The patient has been provided with contact information for the care management team and has been advised to call with any health related questions or concerns.    Pasco Lunger BSN, RN RN Care Manager   Transitions of Care [facility]BCI - [facility] [place] Direct Dial Number:  [phone]         Patient verbalizes understanding of instructions and care plan provided today and agrees to view in MyChart. Active MyChart status and patient understanding of how to access instructions and care plan via MyChart confirmed with patient.     Telephone follow up appointment with care management team member scheduled for: 08/24/23 at 10 AM.  Please call the care guide team at [phone] if you need to cancel or reschedule your appointment.   Please call the Suicide and Crisis Lifeline: 988 go to [facility] Urgent [facility] [address],  [place] [phone]) if you are experiencing a Mental Health or Behavioral Health Crisis or need someone to talk to.  Cheyan Frees Gladis BSN, RN RN Care Manager   Transitions of Care [facility]BCI - [facility] Direct  Dial Number:  [phone]

## 2023-08-22 DIAGNOSIS — N1832 Chronic kidney disease, stage 3b: Secondary | ICD-10-CM | POA: Diagnosis not present

## 2023-08-22 DIAGNOSIS — E7800 Pure hypercholesterolemia, unspecified: Secondary | ICD-10-CM | POA: Diagnosis not present

## 2023-08-22 DIAGNOSIS — E118 Type 2 diabetes mellitus with unspecified complications: Secondary | ICD-10-CM | POA: Diagnosis not present

## 2023-08-23 ENCOUNTER — Encounter (INDEPENDENT_AMBULATORY_CARE_PROVIDER_SITE_OTHER): Payer: Medicare Other | Admitting: Ophthalmology

## 2023-08-23 DIAGNOSIS — H43813 Vitreous degeneration, bilateral: Secondary | ICD-10-CM

## 2023-08-23 DIAGNOSIS — Z7984 Long term (current) use of oral hypoglycemic drugs: Secondary | ICD-10-CM

## 2023-08-23 DIAGNOSIS — H35033 Hypertensive retinopathy, bilateral: Secondary | ICD-10-CM | POA: Diagnosis not present

## 2023-08-23 DIAGNOSIS — E113291 Type 2 diabetes mellitus with mild nonproliferative diabetic retinopathy without macular edema, right eye: Secondary | ICD-10-CM

## 2023-08-23 DIAGNOSIS — E113312 Type 2 diabetes mellitus with moderate nonproliferative diabetic retinopathy with macular edema, left eye: Secondary | ICD-10-CM | POA: Diagnosis not present

## 2023-08-23 DIAGNOSIS — H338 Other retinal detachments: Secondary | ICD-10-CM | POA: Diagnosis not present

## 2023-08-23 DIAGNOSIS — I10 Essential (primary) hypertension: Secondary | ICD-10-CM | POA: Diagnosis not present

## 2023-08-24 ENCOUNTER — Other Ambulatory Visit: Payer: Self-pay

## 2023-08-24 NOTE — Patient Outreach (Signed)
  Care Management  Transitions of Care Program Transitions of Care Post-discharge week 3  08/24/2023 Name: Virginia Gilbert MRN: [id number] DOB: May 04, 1941  Subjective: Virginia Gilbert is a 83 y.o. year old female who is a primary care patient of Gerome Brunet, DO. The Care Management team was unable to reach the patient by phone to assess and address transitions of care needs.   Plan: Additional outreach attempts will be made to reach the patient enrolled in the [facility] Program (Post Inpatient/ED Visit).  Cecia Egge Gladis BSN, Programmer, Systems / Transitions of Care [place] / [facility], [facility] Direct Dial Number:  [phone]

## 2023-08-29 DIAGNOSIS — N390 Urinary tract infection, site not specified: Secondary | ICD-10-CM | POA: Diagnosis not present

## 2023-08-29 DIAGNOSIS — K2100 Gastro-esophageal reflux disease with esophagitis, without bleeding: Secondary | ICD-10-CM | POA: Diagnosis not present

## 2023-08-29 DIAGNOSIS — E7800 Pure hypercholesterolemia, unspecified: Secondary | ICD-10-CM | POA: Diagnosis not present

## 2023-08-29 DIAGNOSIS — E118 Type 2 diabetes mellitus with unspecified complications: Secondary | ICD-10-CM | POA: Diagnosis not present

## 2023-08-29 DIAGNOSIS — I5032 Chronic diastolic (congestive) heart failure: Secondary | ICD-10-CM | POA: Diagnosis not present

## 2023-08-29 DIAGNOSIS — N1832 Chronic kidney disease, stage 3b: Secondary | ICD-10-CM | POA: Diagnosis not present

## 2023-08-29 DIAGNOSIS — I10 Essential (primary) hypertension: Secondary | ICD-10-CM | POA: Diagnosis not present

## 2023-08-29 DIAGNOSIS — I4819 Other persistent atrial fibrillation: Secondary | ICD-10-CM | POA: Diagnosis not present

## 2023-09-04 ENCOUNTER — Ambulatory Visit: Payer: Medicare Other | Admitting: Cardiology

## 2023-09-11 ENCOUNTER — Encounter: Payer: Self-pay | Admitting: Cardiology

## 2023-09-11 ENCOUNTER — Ambulatory Visit: Payer: Medicare Other | Attending: Cardiology | Admitting: Cardiology

## 2023-09-11 VITALS — BP 140/80 | HR 72 | Ht 59.0 in | Wt 141.0 lb

## 2023-09-11 DIAGNOSIS — Z79899 Other long term (current) drug therapy: Secondary | ICD-10-CM | POA: Diagnosis not present

## 2023-09-11 DIAGNOSIS — I4819 Other persistent atrial fibrillation: Secondary | ICD-10-CM

## 2023-09-11 DIAGNOSIS — Z5181 Encounter for therapeutic drug level monitoring: Secondary | ICD-10-CM | POA: Diagnosis not present

## 2023-09-11 DIAGNOSIS — D6869 Other thrombophilia: Secondary | ICD-10-CM

## 2023-09-11 NOTE — Patient Instructions (Signed)
Medication Instructions:  Your physician recommends that you continue on your current medications as directed. Please refer to the Current Medication list given to you today.  *If you need a refill on your cardiac medications before your next appointment, please call your pharmacy*   Lab Work: None ordered   Testing/Procedures: None ordered   Follow-Up: At [facility], you and your health needs are our priority.  As part of our continuing mission to provide you with exceptional heart care, we have created designated Provider Care Teams.  These Care Teams include your primary Cardiologist (physician) and Advanced Practice Providers (APPs -  Physician Assistants and Nurse Practitioners) who all work together to provide you with the care you need, when you need it.  Your next appointment:   6 month(s)  The format for your next appointment:   In Person  Provider:   You will follow up in the Atrial Fibrillation Clinic located at [facility] Your provider will be: Clint R. Fenton, PA-C or Lake Bells, PA-C    Thank you for choosing [facility]!!   Dory Horn, RN [phone]

## 2023-09-11 NOTE — Progress Notes (Signed)
  Electrophysiology Office Note:   Date:  09/11/2023  ID:  Zadie, Deemer [birth year], MRN [id number]  Primary Cardiologist: [name], MD Primary Heart Failure: None Electrophysiologist: Tonie Vizcarrondo Jorja Loa, MD      History of Present Illness:   Virginia Gilbert is a 83 y.o. female with h/o diabetes, hyperlipidemia, hypertension, atrial fibrillation seen today for routine electrophysiology followup.   Since last being seen in our clinic the patient reports doing well.  She has noted no further episodes of atrial fibrillation since being loaded on dofetilide.  She has felt well without acute complaints.  Her main issue is that she does not wish to take more medications.  Aside from that, she is doing well..  she denies chest pain, palpitations, dyspnea, PND, orthopnea, nausea, vomiting, dizziness, syncope, edema, weight gain, or early satiety.   Review of systems complete and found to be negative unless listed in HPI.   EP Information / Studies Reviewed:    EKG is ordered today. Personal review as below.  EKG Interpretation Date/Time:  Tuesday September 11 2023 14:13:30 EST Ventricular Rate:  72 PR Interval:  148 QRS Duration:  116 QT Interval:  452 QTC Calculation: 494 R Axis:   72  Text Interpretation: Normal sinus rhythm Right bundle branch block Confirmed by Ayriana Wix ([id number]) on 09/11/2023 2:18:24 PM     Risk Assessment/Calculations:    CHA2DS2-VASc Score = 6   This indicates a 9.7% annual risk of stroke. The patient's score is based upon: CHF History: 1 HTN History: 1 Diabetes History: 1 Stroke History: 0 Vascular Disease History: 0 Age Score: 2 Gender Score: 1            Physical Exam:   VS:  BP (!) 140/80 (BP Location: Left Arm, Patient Position: Sitting, Cuff Size: Normal)   Pulse 72   Ht 4\' 11"  (1.499 m)   Wt 141 lb (64 kg)   SpO2 95%   BMI 28.48 kg/m    Wt Readings from Last 3 Encounters:  09/11/23 141 lb (64 kg)  08/10/23 141 lb 6.4 oz  (64.1 kg)  07/30/23 137 lb 8 oz (62.4 kg)     GEN: Well nourished, well developed in no acute distress NECK: No JVD; No carotid bruits CARDIAC: Regular rate and rhythm, no murmurs, rubs, gallops RESPIRATORY:  Clear to auscultation without rales, wheezing or rhonchi  ABDOMEN: Soft, non-tender, non-distended EXTREMITIES:  No edema; No deformity   ASSESSMENT AND PLAN:    1.  Persistent atrial fibrillation: Currently on dofetilide.  Has had ablation 12/02/2019.  She has remained in sinus rhythm since being loaded on dofetilide.  As she has not had further episodes of atrial fibrillation, we Donnavin Vandenbrink continue with dofetilide.  If she has further episodes on this, would potentially consider ablation.  2.  Secondary hypercoagulable state: Currently on Eliquis for atrial fibrillation  3.  Hypertension: Mildly elevated today.  Usually well-controlled.  Plan per primary physician.  4.  Chronic diastolic heart failure: No obvious volume overload  5.  High risk medication monitoring: Currently on dofetilide for atrial fibrillation.  QTc remained stable.  Follow up with Afib Clinic in 6 months  Signed, Camyla Camposano Jorja Loa, MD

## 2023-09-17 ENCOUNTER — Telehealth: Payer: Self-pay

## 2023-09-17 NOTE — Patient Outreach (Signed)
Care Management  Transitions of Care Program Transitions of Care Post-discharge [facility] Program Close   09/17/2023 Name: Virginia Gilbert MRN: [id number] DOB: [birth year]  Subjective: Virginia Gilbert is a 83 y.o. year old female who is a primary care patient of Irena Reichmann, DO. The Care Management team Engaged with patient by telephone to assess and address transitions of care needs.   Consent to Services:     Patient has successfully completed 30-day TOC program. Declined transfer to longitudinal RN CM. Case   Assessment:   Patient doing well-states she has not experienced any episodes of atrial fibrillation nor adverse effects from her medications.  She has completed all her post-hospital doctor appointments. With her next appointment with the Cardiologist on November 29, 2023. Patient has successfully completed 30-day TOC program.  Declined transfer to longitudinal RN CM. Case is being closed at this time. Patient has RN CM contact info and aware they can contact RN CM in the future if needs arise.       SDOH Interventions    Flowsheet Row Telephone from 08/03/2023 in [place] POPULATION HEALTH DEPARTMENT  SDOH Interventions   Food Insecurity Interventions Intervention Not Indicated  Housing Interventions Intervention Not Indicated  Transportation Interventions Intervention Not Indicated  Utilities Interventions Intervention Not Indicated        Goals Addressed             This Visit's Progress    COMPLETED: TOC Care Plan       Current Barriers:  Chronic Disease Management support and education needs related to Atrial Fibrillation   RNCM Clinical Goal(s):  Patient will work with the Care Management team over the next 30 days to address Transition of Care Barriers: Medication Management Provider appointments take all medications exactly as prescribed and will call provider for medication related questions as evidenced by no missed medications no unmanaged side effects attend  all scheduled medical appointments: with PCP and Cardiology as evidenced by no missed appointments upon EMR review. continue to work with RN Care Manager to address care management and care coordination needs related to  Atrial Fibrillation as evidenced by adherence to CM Team Scheduled appointments through collaboration with RN Care manager, provider, and care team.   Interventions: Evaluation of current treatment plan related to  self management and patient's adherence to plan as established by provider. Medications reviewed including therapeutic benefits in managing her medical conditions and potential side effects.  Patient verbalizes 100% adherence with medication regime. Reviewed patients blood sugar and blood pressure readings with her.  Discussed with patient taking her logs with blood pressure, weight and blood sugar readings with her upcoming doctor appointments.  Patient expressed she would take them to share at her next appointments.  Transitions of Care:  Goal on track:  Yes. Upcoming doctor appointments reviewed:  PCP - 08/22/23, Cardiology - 09/11/23  AFIB Interventions: (Status:  Goal on track:  Yes.) Short Term Goal    Patient education on the signs and symptoms to report and/or seek immediate medical attention include:  irregular heartbeat, palpitations, pounding, chest pain, shortness of breath or dizziness.  Patient verbalized understanding.   -Discussed/reviewed heart healthy diet and making healthy food choices.  Patient denies any barriers to selecting healthy choice foods and verbalizes good understanding.   Patient Goals/Self-Care Activities: Participate in Transition of Care Program/Attend TOC scheduled calls Take all medications as prescribed Attend all scheduled provider appointments Call provider office for new concerns or questions   Follow Up Plan:  The patient has been provided with contact information for the care management team and has been advised to call with  any health related questions or concerns.    Wyline Mood BSN, RN RN Care Manager   Transitions of Care VBCI - Population [facility] [place] Direct Dial Number:  [phone]         Plan:   Patient has successfully completed 30-day TOC program.  Patient has been provided with RN CM contact info and aware that she can call for any future needs or concerns.  Wyline Mood BSN, Programmer, systems   Transitions of Care  [place] / [facility], [facility] Direct Dial Number: [phone]  Fax: [phone]

## 2023-09-17 NOTE — Progress Notes (Signed)
 This encounter was created in error - please disregard.

## 2023-09-18 ENCOUNTER — Encounter: Payer: Self-pay | Admitting: Podiatry

## 2023-09-18 ENCOUNTER — Ambulatory Visit (INDEPENDENT_AMBULATORY_CARE_PROVIDER_SITE_OTHER): Payer: Medicare Other | Admitting: Podiatry

## 2023-09-18 DIAGNOSIS — B351 Tinea unguium: Secondary | ICD-10-CM | POA: Diagnosis not present

## 2023-09-18 DIAGNOSIS — M79675 Pain in left toe(s): Secondary | ICD-10-CM | POA: Diagnosis not present

## 2023-09-18 DIAGNOSIS — M79674 Pain in right toe(s): Secondary | ICD-10-CM | POA: Diagnosis not present

## 2023-09-18 DIAGNOSIS — E119 Type 2 diabetes mellitus without complications: Secondary | ICD-10-CM

## 2023-09-19 ENCOUNTER — Encounter (INDEPENDENT_AMBULATORY_CARE_PROVIDER_SITE_OTHER): Payer: Medicare Other | Admitting: Ophthalmology

## 2023-09-19 DIAGNOSIS — H338 Other retinal detachments: Secondary | ICD-10-CM

## 2023-09-19 DIAGNOSIS — H35033 Hypertensive retinopathy, bilateral: Secondary | ICD-10-CM | POA: Diagnosis not present

## 2023-09-19 DIAGNOSIS — E113391 Type 2 diabetes mellitus with moderate nonproliferative diabetic retinopathy without macular edema, right eye: Secondary | ICD-10-CM

## 2023-09-19 DIAGNOSIS — E113312 Type 2 diabetes mellitus with moderate nonproliferative diabetic retinopathy with macular edema, left eye: Secondary | ICD-10-CM

## 2023-09-19 DIAGNOSIS — H43813 Vitreous degeneration, bilateral: Secondary | ICD-10-CM

## 2023-09-19 DIAGNOSIS — I10 Essential (primary) hypertension: Secondary | ICD-10-CM

## 2023-09-19 DIAGNOSIS — Z7984 Long term (current) use of oral hypoglycemic drugs: Secondary | ICD-10-CM

## 2023-09-25 NOTE — Progress Notes (Signed)
  Subjective:  Patient ID: Virginia Gilbert, female    DOB: 1940/12/02,  MRN: [id number]  Virginia Gilbert presents to clinic today for preventative diabetic foot care and painful elongated mycotic toenails 1-5 bilaterally which are tender when wearing enclosed shoe gear. Pain is relieved with periodic professional debridement.  Chief Complaint  Patient presents with   [facility]    She is here for a nail trim and has pain in the 2nd toe on right foot, PCP is Lonell Collet and last seen last month, Last A1C was 7.2   New problem(s): None.   PCP is Collet Lonell, DO.  Allergies  Allergen Reactions   Penicillins Anaphylaxis        Tramadol Itching   Codeine Nausea And Vomiting    Review of Systems: Negative except as noted in the HPI.  Objective: No changes noted in today's physical examination. There were no vitals filed for this visit. Virginia Gilbert is a pleasant 83 y.o. female in NAD. AAO x 3.  Vascular Examination: CFT <3 seconds b/l. DP pulses palpable b/l. PT pulses nonpalpable b/l. Digital hair absent. Skin temperature gradient warm to warm b/l. No pain with calf compression. No ischemia or gangrene. No cyanosis or clubbing noted b/l.    Neurological Examination: Sensation grossly intact b/l with 10 gram monofilament. Vibratory sensation intact b/l. Pt has subjective symptoms of neuropathy.  Dermatological Examination: Pedal skin warm and supple b/l. No open wounds b/l. No interdigital macerations. Toenails 1-5 b/l thick, discolored, elongated with subungual debris and pain on dorsal palpation.  No hyperkeratotic nor porokeratotic lesions present on today's visit.  Musculoskeletal Examination: Muscle strength 5/5 to all lower extremity muscle groups bilaterally. Hammertoe(s) noted to the R 2nd toe.  Radiographs: None  Assessment/Plan: 1. Pain due to onychomycosis of toenails of both feet   2. Type 2 diabetes mellitus without complication, without long-term current use of  insulin  [facility])     Patient was evaluated and treated. All patient's and/or POA's questions/concerns addressed on today's visit. Toenails 1-5 debrided in length and girth without incident. Discussed hammertoe  right 2nd digit. Continue foot and shoe inspections daily. Monitor blood glucose per PCP/Endocrinologist's recommendations. Continue soft, supportive shoe gear daily. Report any pedal injuries to medical professional. Call office if there are any questions/concerns. -Patient/POA to call should there be question/concern in the interim.   Return in about 3 months (around 12/16/2023).  Delon LITTIE Merlin, DPM      [place] LOCATION: [address] [place] [address]                   Office [phone]   [facility] LOCATION: [address] [place], [place] [address] Office [phone]

## 2023-10-17 ENCOUNTER — Encounter (INDEPENDENT_AMBULATORY_CARE_PROVIDER_SITE_OTHER): Payer: Self-pay

## 2023-10-17 ENCOUNTER — Encounter (INDEPENDENT_AMBULATORY_CARE_PROVIDER_SITE_OTHER): Admitting: Ophthalmology

## 2023-10-17 ENCOUNTER — Encounter (INDEPENDENT_AMBULATORY_CARE_PROVIDER_SITE_OTHER): Payer: Medicare Other | Admitting: Ophthalmology

## 2023-10-17 DIAGNOSIS — I10 Essential (primary) hypertension: Secondary | ICD-10-CM

## 2023-10-17 DIAGNOSIS — E113312 Type 2 diabetes mellitus with moderate nonproliferative diabetic retinopathy with macular edema, left eye: Secondary | ICD-10-CM | POA: Diagnosis not present

## 2023-10-17 DIAGNOSIS — H35033 Hypertensive retinopathy, bilateral: Secondary | ICD-10-CM

## 2023-10-17 DIAGNOSIS — E113391 Type 2 diabetes mellitus with moderate nonproliferative diabetic retinopathy without macular edema, right eye: Secondary | ICD-10-CM

## 2023-10-17 DIAGNOSIS — H43813 Vitreous degeneration, bilateral: Secondary | ICD-10-CM

## 2023-10-17 DIAGNOSIS — Z7984 Long term (current) use of oral hypoglycemic drugs: Secondary | ICD-10-CM | POA: Diagnosis not present

## 2023-10-17 DIAGNOSIS — H338 Other retinal detachments: Secondary | ICD-10-CM

## 2023-11-14 ENCOUNTER — Encounter (INDEPENDENT_AMBULATORY_CARE_PROVIDER_SITE_OTHER): Admitting: Ophthalmology

## 2023-11-14 DIAGNOSIS — E113313 Type 2 diabetes mellitus with moderate nonproliferative diabetic retinopathy with macular edema, bilateral: Secondary | ICD-10-CM

## 2023-11-14 DIAGNOSIS — I10 Essential (primary) hypertension: Secondary | ICD-10-CM

## 2023-11-14 DIAGNOSIS — H43813 Vitreous degeneration, bilateral: Secondary | ICD-10-CM | POA: Diagnosis not present

## 2023-11-14 DIAGNOSIS — Z7984 Long term (current) use of oral hypoglycemic drugs: Secondary | ICD-10-CM | POA: Diagnosis not present

## 2023-11-14 DIAGNOSIS — H35033 Hypertensive retinopathy, bilateral: Secondary | ICD-10-CM | POA: Diagnosis not present

## 2023-11-29 ENCOUNTER — Ambulatory Visit (HOSPITAL_COMMUNITY)
Admission: RE | Admit: 2023-11-29 | Discharge: 2023-11-29 | Disposition: A | Discharge status: Home / Self Care | Payer: Medicare Other | Source: Ambulatory Visit | Attending: Physician Assistant | Admitting: Physician Assistant

## 2023-11-29 ENCOUNTER — Encounter (HOSPITAL_COMMUNITY): Payer: Self-pay | Admitting: Physician Assistant

## 2023-11-29 VITALS — BP 148/88 | HR 48 | Ht 59.0 in | Wt 144.4 lb

## 2023-11-29 DIAGNOSIS — R001 Bradycardia, unspecified: Secondary | ICD-10-CM | POA: Diagnosis not present

## 2023-11-29 DIAGNOSIS — Z79899 Other long term (current) drug therapy: Secondary | ICD-10-CM | POA: Insufficient documentation

## 2023-11-29 DIAGNOSIS — I110 Hypertensive heart disease with heart failure: Secondary | ICD-10-CM | POA: Insufficient documentation

## 2023-11-29 DIAGNOSIS — Z7901 Long term (current) use of anticoagulants: Secondary | ICD-10-CM | POA: Diagnosis not present

## 2023-11-29 DIAGNOSIS — I5032 Chronic diastolic (congestive) heart failure: Secondary | ICD-10-CM | POA: Insufficient documentation

## 2023-11-29 DIAGNOSIS — Z5181 Encounter for therapeutic drug level monitoring: Secondary | ICD-10-CM | POA: Diagnosis not present

## 2023-11-29 DIAGNOSIS — D6869 Other thrombophilia: Secondary | ICD-10-CM | POA: Insufficient documentation

## 2023-11-29 DIAGNOSIS — I4819 Other persistent atrial fibrillation: Secondary | ICD-10-CM | POA: Diagnosis not present

## 2023-11-29 DIAGNOSIS — Z7984 Long term (current) use of oral hypoglycemic drugs: Secondary | ICD-10-CM | POA: Insufficient documentation

## 2023-11-29 DIAGNOSIS — I4892 Unspecified atrial flutter: Secondary | ICD-10-CM | POA: Diagnosis not present

## 2023-11-29 DIAGNOSIS — E119 Type 2 diabetes mellitus without complications: Secondary | ICD-10-CM | POA: Insufficient documentation

## 2023-11-29 LAB — BASIC METABOLIC PANEL WITH GFR
Anion gap: 14 (ref 5–15)
BUN: 33 mg/dL — ABNORMAL HIGH (ref 8–23)
CO2: 20 mmol/L — ABNORMAL LOW (ref 22–32)
Calcium: 8.6 mg/dL — ABNORMAL LOW (ref 8.9–10.3)
Chloride: 102 mmol/L (ref 98–111)
Creatinine, Ser: 1.78 mg/dL — ABNORMAL HIGH (ref 0.44–1.00)
GFR, Estimated: 29 mL/min — ABNORMAL LOW (ref >60)
Glucose, Bld: 186 mg/dL — ABNORMAL HIGH (ref 70–99)
Potassium: 3.5 mmol/L (ref 3.5–5.1)
Sodium: 136 mmol/L (ref 135–145)

## 2023-11-29 LAB — MAGNESIUM: Magnesium: 1.8 mg/dL (ref 1.7–2.4)

## 2023-11-29 MED ORDER — CARVEDILOL 3.125 MG PO TABS: 3.1250 mg | ORAL_TABLET | Freq: Two times a day (BID) | ORAL | 3 refills | Status: DC

## 2023-11-29 NOTE — Progress Notes (Signed)
 Primary Care Physician: Irena Reichmann, DO Primary Cardiologist: Dr Jacques Navy Primary Electrophysiologist: Dr Elberta Fortis  Referring Physician: Dr Albertina Parr Virginia Gilbert is a 83 y.o. female with a history of DM, HLD, HTN, and persistent atrial fibrillation who presents for follow up in the [facility]. The patient was initially diagnosed with atrial fibrillation 05/2019 after presenting with symptoms of worsening dyspnea on exertion. Patient is on Eliquis for stroke prevention. There were no specific triggers that the patient could identify. She underwent DCCV on 07/08/19 but had ERAF. She was loaded on amiodarone and had DCCV again on 09/02/19 but unfortunately she was back in afib again. She denies snoring or alcohol use. Patient is s/p afib ablation with Dr Johney Frame on 12/02/19. She was back in afib on her follow up 01/05/20 and had another DCCV on 01/13/20. Her amiodarone was later discontinued.   She was seen by Dr Jacques Navy 04/20/23 and found to be in persistent afib. Thought to be precipitated by her COVID 19 infection a couple months previous. She underwent DCCV on 05/16/23 but was back out of rhythm on follow up 10/18. Started back on amiodarone 06/11/23 but had to discontinue due to intolerable side effects (nausea, weakness).   S/p Tikosyn admission 12/16-19. S/p successful DCCV on 08/01/23  Patient returns for follow up for atrial fibrillation and dofetilide monitoring. She reports that she has felt well since her last visit. She has been more active working in her yard. No bleeding issues on anticoagulation. She denies dizziness, presyncope, or falls.   Today, she  denies symptoms of palpitations, chest pain, shortness of breath, orthopnea, PND, lower extremity edema, dizziness, presyncope, syncope, snoring, daytime somnolence, bleeding, or neurologic sequela. The patient is tolerating medications without difficulties and is otherwise without complaint today.    Atrial  Fibrillation Risk Factors:  she does not have symptoms or diagnosis of sleep apnea. she does not have a history of rheumatic fever. she does not have a history of alcohol use. The patient does not have a history of early familial atrial fibrillation or other arrhythmias.   Atrial Fibrillation Management history:  Previous antiarrhythmic drugs: amiodarone, tikosyn Previous cardioversions: 07/08/19, 09/02/19, 05/16/23, 08/01/23 Previous ablations: 12/02/19 Anticoagulation history: Eliquis   Past Medical History:  Diagnosis Date   Diabetes mellitus without complication (HCC)    Hyperlipidemia    Hypertension    Mitral regurgitation    evaluated by TEE 09/2019   Persistent atrial fibrillation (HCC)     Current Outpatient Medications  Medication Sig Dispense Refill   acetaminophen (TYLENOL) 500 MG tablet Take 1,000 mg by mouth every 8 (eight) hours as needed for moderate pain or headache.     apixaban (ELIQUIS) 2.5 MG TABS tablet Take 1 tablet (2.5 mg total) by mouth 2 (two) times daily. 180 tablet 1   carvedilol (COREG) 6.25 MG tablet Take 6.25 mg by mouth 2 (two) times daily.     Cholecalciferol (VITAMIN D-3) 125 MCG (5000 UT) TABS Take 5,000 Units by mouth daily with lunch.      ciprofloxacin (CILOXAN) 0.3 % ophthalmic solution Place 1 drop into the left eye See admin instructions. Instill 1 drop into the left eye 4 times daily for 2 days after each monthly eye injection     cyanocobalamin (VITAMIN B12) 1000 MCG tablet Take 1,000 mcg by mouth daily.     dofetilide (TIKOSYN) 125 MCG capsule Take 1 capsule (125 mcg total) by mouth 2 (two) times daily. 180 capsule  1   empagliflozin (JARDIANCE) 25 MG TABS tablet Take 25 mg by mouth daily. Medication on hold since the 14th, due to procedure.     ezetimibe (ZETIA) 10 MG tablet Take 10 mg by mouth daily.     furosemide (LASIX) 20 MG tablet Take 2 tablets (40 mg total) by mouth daily in the mornings and 1 tablet (20 mg total) by mouth daily in  the evenings. (Patient taking differently: Take 20 mg by mouth daily.) 360 tablet 3   glimepiride (AMARYL) 4 MG tablet Take 1 tablet (4 mg total) by mouth daily with breakfast. (Patient taking differently: Take 4 mg by mouth 2 (two) times daily.)  1   LORazepam (ATIVAN) 0.5 MG tablet Take 0.5 mg by mouth daily as needed for anxiety.     losartan (COZAAR) 100 MG tablet Take 100 mg by mouth daily.  6   magnesium oxide (MAG-OX) 400 (240 Mg) MG tablet Take 1 tablet (400 mg total) by mouth at bedtime. 90 tablet 1   magnesium oxide (MAG-OX) 400 MG tablet Take 1 tablet (400 mg total) by mouth daily. 30 tablet 3   metFORMIN (GLUCOPHAGE) 1000 MG tablet Take 1,000 mg by mouth 2 (two) times daily with a meal.   6   potassium chloride SA (KLOR-CON M) 20 MEQ tablet Take 1 tablet (20 mEq total) by mouth daily. 90 tablet 1   pravastatin (PRAVACHOL) 10 MG tablet Take 10 mg by mouth daily.     No current facility-administered medications for this encounter.    ROS- All systems are reviewed and negative except as per the HPI above.  Physical Exam: Vitals:   11/29/23 1114  BP: (!) 148/88  Pulse: (!) 48  Weight: 65.5 kg  Height: 4\' 11"  (1.499 m)    GEN: Well nourished, well developed in no acute distress CARDIAC: Regular rate and rhythm with occasional ectopy, no murmurs, rubs, gallops RESPIRATORY:  Clear to auscultation without rales, wheezing or rhonchi  ABDOMEN: Soft, non-tender, non-distended EXTREMITIES:  No edema; No deformity    Wt Readings from Last 3 Encounters:  11/29/23 65.5 kg  09/11/23 64 kg  08/10/23 64.1 kg    EKG today demonstrates  SB with junctional beats, RBBB Vent. rate 48 BPM PR interval 160 ms QRS duration 122 ms QT/QTcB 480/428 ms   Echo 10/21/21 demonstrated   1. Left ventricular ejection fraction by 3D volume is 56 %. The left  ventricle has normal function. The left ventricle has no regional wall  motion abnormalities. Left ventricular diastolic parameters are   indeterminate.   2. Right ventricular systolic function is normal. The right ventricular  size is normal. There is normal pulmonary artery systolic pressure.   3. The mitral valve is normal in structure. Trivial mitral valve  regurgitation. No evidence of mitral stenosis. Moderate mitral annular  calcification.   4. The aortic valve is normal in structure. Aortic valve regurgitation is  not visualized. Aortic valve sclerosis/calcification is present, without  any evidence of aortic stenosis.   5. The inferior vena cava is normal in size with greater than 50%  respiratory variability, suggesting right atrial pressure of 3 mmHg.   Epic records are reviewed at length today  CHA2DS2-VASc Score = 6  The patient's score is based upon: CHF History: 1 HTN History: 1 Diabetes History: 1 Stroke History: 0 Vascular Disease History: 0 Age Score: 2 Gender Score: 1       ASSESSMENT AND PLAN: Persistent Atrial Fibrillation/atrial flutter The patient's  CHA2DS2-VASc score is 6, indicating a 9.7% annual risk of stroke.   S/p ablation 11/2019 Previously failed amiodarone due to intolerable side effects S/p dofetilide admission 07/2023 Patient in SR. She is asymptomatic with her bradycardia. Will decrease carvedilol to 3.125 mg BID and have her return for ECG in 2 weeks.  Continue dofetilide 125 mcg BID Continue Eliquis 2.5 mg BID  Secondary Hypercoagulable State (ICD10:  D68.69) The patient is at significant risk for stroke/thromboembolism based upon her CHA2DS2-VASc Score of 6.  Continue Apixaban (Eliquis). No bleeding issues.   High Risk Medication Monitoring (ICD 10: Z79.899) QT interval on ECG acceptable for dofetilide monitoring. Check bmet/mag today.      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    Myrtha Ates PA-C Afib  Clinic [facility] [address] [place], [place] [address] [phone] 11/29/2023 11:36 AM

## 2023-11-29 NOTE — Patient Instructions (Signed)
Decrease coreg to 3.125 mg twice a day

## 2023-12-12 ENCOUNTER — Encounter (INDEPENDENT_AMBULATORY_CARE_PROVIDER_SITE_OTHER): Admitting: Ophthalmology

## 2023-12-12 ENCOUNTER — Ambulatory Visit (HOSPITAL_COMMUNITY)
Admission: RE | Admit: 2023-12-12 | Discharge: 2023-12-12 | Disposition: A | Discharge status: Home / Self Care | Source: Ambulatory Visit | Attending: Physician Assistant | Admitting: Physician Assistant

## 2023-12-12 DIAGNOSIS — Z79899 Other long term (current) drug therapy: Secondary | ICD-10-CM

## 2023-12-12 DIAGNOSIS — H338 Other retinal detachments: Secondary | ICD-10-CM | POA: Diagnosis not present

## 2023-12-12 DIAGNOSIS — E113312 Type 2 diabetes mellitus with moderate nonproliferative diabetic retinopathy with macular edema, left eye: Secondary | ICD-10-CM

## 2023-12-12 DIAGNOSIS — Z7984 Long term (current) use of oral hypoglycemic drugs: Secondary | ICD-10-CM | POA: Diagnosis not present

## 2023-12-12 DIAGNOSIS — I4819 Other persistent atrial fibrillation: Secondary | ICD-10-CM

## 2023-12-12 DIAGNOSIS — E113391 Type 2 diabetes mellitus with moderate nonproliferative diabetic retinopathy without macular edema, right eye: Secondary | ICD-10-CM

## 2023-12-12 DIAGNOSIS — Z5181 Encounter for therapeutic drug level monitoring: Secondary | ICD-10-CM | POA: Diagnosis not present

## 2023-12-12 DIAGNOSIS — D6869 Other thrombophilia: Secondary | ICD-10-CM | POA: Diagnosis not present

## 2023-12-12 DIAGNOSIS — H43813 Vitreous degeneration, bilateral: Secondary | ICD-10-CM | POA: Diagnosis not present

## 2023-12-12 DIAGNOSIS — I10 Essential (primary) hypertension: Secondary | ICD-10-CM

## 2023-12-12 DIAGNOSIS — H35033 Hypertensive retinopathy, bilateral: Secondary | ICD-10-CM | POA: Diagnosis not present

## 2023-12-12 MED ORDER — POTASSIUM CHLORIDE CRYS ER 10 MEQ PO TBCR: 10.0000 meq | EXTENDED_RELEASE_TABLET | Freq: Two times a day (BID) | ORAL | 1 refills | Status: DC

## 2023-12-12 NOTE — Progress Notes (Signed)
 Patient returns for ECG after decreasing carvedilol . ECG shows:  SR, RBBB Vent. rate 63 BPM PR interval 150 ms QRS duration 128 ms QT/QTcB 476/488 ms  Junctional beats have resolved on lower dose of BB. Continue present medications. Follow up in 3-4 months.

## 2023-12-12 NOTE — Addendum Note (Signed)
 Encounter addended by: Rich Champ, CMA on: 12/12/2023 11:47 AM  Actions taken: Medication long-term status modified, Pharmacy for encounter modified, Order list changed

## 2023-12-13 LAB — BASIC METABOLIC PANEL WITH GFR
BUN/Creatinine Ratio: 19 (ref 12–28)
BUN: 30 mg/dL — ABNORMAL HIGH (ref 8–27)
CO2: 23 mmol/L (ref 20–29)
Calcium: 9.2 mg/dL (ref 8.7–10.3)
Chloride: 98 mmol/L (ref 96–106)
Creatinine, Ser: 1.55 mg/dL — ABNORMAL HIGH (ref 0.57–1.00)
Glucose: 113 mg/dL — ABNORMAL HIGH (ref 70–99)
Potassium: 5 mmol/L (ref 3.5–5.2)
Sodium: 139 mmol/L (ref 134–144)
eGFR: 34 mL/min/{1.73_m2} — ABNORMAL LOW (ref >59)

## 2023-12-14 DIAGNOSIS — E118 Type 2 diabetes mellitus with unspecified complications: Secondary | ICD-10-CM | POA: Diagnosis not present

## 2023-12-14 DIAGNOSIS — I4819 Other persistent atrial fibrillation: Secondary | ICD-10-CM | POA: Diagnosis not present

## 2023-12-14 DIAGNOSIS — N1832 Chronic kidney disease, stage 3b: Secondary | ICD-10-CM | POA: Diagnosis not present

## 2023-12-21 DIAGNOSIS — Z0000 Encounter for general adult medical examination without abnormal findings: Secondary | ICD-10-CM | POA: Diagnosis not present

## 2023-12-21 DIAGNOSIS — K2100 Gastro-esophageal reflux disease with esophagitis, without bleeding: Secondary | ICD-10-CM | POA: Diagnosis not present

## 2023-12-21 DIAGNOSIS — I5032 Chronic diastolic (congestive) heart failure: Secondary | ICD-10-CM | POA: Diagnosis not present

## 2023-12-21 DIAGNOSIS — N1832 Chronic kidney disease, stage 3b: Secondary | ICD-10-CM | POA: Diagnosis not present

## 2023-12-21 DIAGNOSIS — N390 Urinary tract infection, site not specified: Secondary | ICD-10-CM | POA: Diagnosis not present

## 2023-12-21 DIAGNOSIS — E7800 Pure hypercholesterolemia, unspecified: Secondary | ICD-10-CM | POA: Diagnosis not present

## 2023-12-21 DIAGNOSIS — Z8679 Personal history of other diseases of the circulatory system: Secondary | ICD-10-CM | POA: Diagnosis not present

## 2023-12-21 DIAGNOSIS — E118 Type 2 diabetes mellitus with unspecified complications: Secondary | ICD-10-CM | POA: Diagnosis not present

## 2023-12-21 DIAGNOSIS — I10 Essential (primary) hypertension: Secondary | ICD-10-CM | POA: Diagnosis not present

## 2024-01-05 ENCOUNTER — Other Ambulatory Visit (HOSPITAL_COMMUNITY): Payer: Self-pay | Admitting: Physician Assistant

## 2024-01-05 DIAGNOSIS — I480 Paroxysmal atrial fibrillation: Secondary | ICD-10-CM

## 2024-01-09 ENCOUNTER — Encounter (INDEPENDENT_AMBULATORY_CARE_PROVIDER_SITE_OTHER): Admitting: Ophthalmology

## 2024-01-09 DIAGNOSIS — E113312 Type 2 diabetes mellitus with moderate nonproliferative diabetic retinopathy with macular edema, left eye: Secondary | ICD-10-CM

## 2024-01-09 DIAGNOSIS — H35033 Hypertensive retinopathy, bilateral: Secondary | ICD-10-CM | POA: Diagnosis not present

## 2024-01-09 DIAGNOSIS — H43813 Vitreous degeneration, bilateral: Secondary | ICD-10-CM

## 2024-01-09 DIAGNOSIS — E113391 Type 2 diabetes mellitus with moderate nonproliferative diabetic retinopathy without macular edema, right eye: Secondary | ICD-10-CM

## 2024-01-09 DIAGNOSIS — I10 Essential (primary) hypertension: Secondary | ICD-10-CM

## 2024-01-09 DIAGNOSIS — Z7984 Long term (current) use of oral hypoglycemic drugs: Secondary | ICD-10-CM | POA: Diagnosis not present

## 2024-01-22 ENCOUNTER — Ambulatory Visit (INDEPENDENT_AMBULATORY_CARE_PROVIDER_SITE_OTHER)

## 2024-01-22 ENCOUNTER — Ambulatory Visit: Payer: Medicare Other | Admitting: Podiatry

## 2024-01-22 ENCOUNTER — Encounter: Payer: Self-pay | Admitting: Podiatry

## 2024-01-22 DIAGNOSIS — M10071 Idiopathic gout, right ankle and foot: Secondary | ICD-10-CM | POA: Diagnosis not present

## 2024-01-22 DIAGNOSIS — R600 Localized edema: Secondary | ICD-10-CM

## 2024-01-22 DIAGNOSIS — M7751 Other enthesopathy of right foot: Secondary | ICD-10-CM | POA: Diagnosis not present

## 2024-01-22 NOTE — Patient Instructions (Signed)

## 2024-01-23 LAB — BASIC METABOLIC PANEL WITH GFR
BUN/Creatinine Ratio: 20 (ref 12–28)
BUN: 30 mg/dL — ABNORMAL HIGH (ref 8–27)
CO2: 19 mmol/L — ABNORMAL LOW (ref 20–29)
Calcium: 9.2 mg/dL (ref 8.7–10.3)
Chloride: 101 mmol/L (ref 96–106)
Creatinine, Ser: 1.53 mg/dL — ABNORMAL HIGH (ref 0.57–1.00)
Glucose: 100 mg/dL — ABNORMAL HIGH (ref 70–99)
Potassium: 4.1 mmol/L (ref 3.5–5.2)
Sodium: 142 mmol/L (ref 134–144)
eGFR: 34 mL/min/{1.73_m2} — ABNORMAL LOW (ref >59)

## 2024-01-23 LAB — CBC WITH DIFFERENTIAL/PLATELET
Basophils Absolute: 0 10*3/uL (ref 0.0–0.2)
Basos: 1 %
EOS (ABSOLUTE): 0.2 10*3/uL (ref 0.0–0.4)
Eos: 3 %
Hematocrit: 38.2 % (ref 34.0–46.6)
Hemoglobin: 11.4 g/dL (ref 11.1–15.9)
Immature Grans (Abs): 0 10*3/uL (ref 0.0–0.1)
Immature Granulocytes: 0 %
Lymphocytes Absolute: 1.1 10*3/uL (ref 0.7–3.1)
Lymphs: 16 %
MCH: 27.1 pg (ref 26.6–33.0)
MCHC: 29.8 g/dL — ABNORMAL LOW (ref 31.5–35.7)
MCV: 91 fL (ref 79–97)
Monocytes Absolute: 0.6 10*3/uL (ref 0.1–0.9)
Monocytes: 8 %
Neutrophils Absolute: 5 10*3/uL (ref 1.4–7.0)
Neutrophils: 72 %
Platelets: 259 10*3/uL (ref 150–450)
RBC: 4.21 x10E6/uL (ref 3.77–5.28)
RDW: 14 % (ref 11.7–15.4)
WBC: 7 10*3/uL (ref 3.4–10.8)

## 2024-01-23 LAB — URIC ACID: Uric Acid: 8 mg/dL — ABNORMAL HIGH (ref 3.1–7.9)

## 2024-01-24 ENCOUNTER — Ambulatory Visit: Payer: Self-pay | Admitting: Podiatry

## 2024-01-24 NOTE — Progress Notes (Signed)
 Subjective: Chief Complaint  Patient presents with   Diabetes    Cut my nails.  My right foot is swollen today.  I don't know why.  Yvonnie Heritage - 08/29/2023;  A1c - 7.1 N - swelling L - dorsal right, lateral D - today O - suddenly C - swelling, pain A - none T - none   83 year old female presents the office today for thick, elongated nails that she is not able to trim herself.  She is also recently noticed swelling to the right ankle over the last couple of days without any injuries that she reports.  She does not report any history of gout.  She points more to the lateral aspect the foot where she gets discomfort.  No recent treatment otherwise.  Objective: AAO x3, NAD DP/PT pulses palpable bilaterally, CRT less than 3 seconds Nails are hypertrophic, dystrophic, brittle, discolored, elongated 10. No surrounding redness or drainage. Tenderness nails 1-5 bilaterally. No open lesions or pre-ulcerative lesions are identified today. There is tenderness palpation along the lateral aspect of right foot along the sinus tarsi and there is localized edema to the foot, ankle.  There is no swelling into the calf, calf is supple without any pain.  No erythema.  No open lesions. 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 Denece Finger without any complications or bleeding.  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  Multiple views obtained.  No evidence of acute fracture noted.  Calcaneal spurring is present. - We discussed different medication options we decided to proceed with steroid injection to the subtalar joint.  Clean skin with Betadine, alcohol.  Mixture 1 cc Kenalog  10, 0.5 cc of Marcaine plain, 0.5 cc lidocaine  plain was infiltrated into the area of tenderness on the sinus tarsi without complications.   Postinjection care discussed.  Tolerated well. - Blood work ordered  Return in about 3 months (around 04/23/2024).  Charity Conch DPM

## 2024-02-06 ENCOUNTER — Encounter (INDEPENDENT_AMBULATORY_CARE_PROVIDER_SITE_OTHER): Admitting: Ophthalmology

## 2024-02-06 DIAGNOSIS — E113312 Type 2 diabetes mellitus with moderate nonproliferative diabetic retinopathy with macular edema, left eye: Secondary | ICD-10-CM | POA: Diagnosis not present

## 2024-02-06 DIAGNOSIS — E113391 Type 2 diabetes mellitus with moderate nonproliferative diabetic retinopathy without macular edema, right eye: Secondary | ICD-10-CM | POA: Diagnosis not present

## 2024-02-06 DIAGNOSIS — H35033 Hypertensive retinopathy, bilateral: Secondary | ICD-10-CM | POA: Diagnosis not present

## 2024-02-06 DIAGNOSIS — I10 Essential (primary) hypertension: Secondary | ICD-10-CM | POA: Diagnosis not present

## 2024-02-06 DIAGNOSIS — H43813 Vitreous degeneration, bilateral: Secondary | ICD-10-CM | POA: Diagnosis not present

## 2024-02-06 DIAGNOSIS — Z7984 Long term (current) use of oral hypoglycemic drugs: Secondary | ICD-10-CM | POA: Diagnosis not present

## 2024-02-06 DIAGNOSIS — H338 Other retinal detachments: Secondary | ICD-10-CM | POA: Diagnosis not present

## 2024-02-21 ENCOUNTER — Other Ambulatory Visit (HOSPITAL_COMMUNITY): Payer: Self-pay | Admitting: Internal Medicine

## 2024-02-28 ENCOUNTER — Other Ambulatory Visit: Payer: Self-pay | Admitting: Internal Medicine

## 2024-03-05 ENCOUNTER — Encounter (INDEPENDENT_AMBULATORY_CARE_PROVIDER_SITE_OTHER): Admitting: Ophthalmology

## 2024-03-05 DIAGNOSIS — E113312 Type 2 diabetes mellitus with moderate nonproliferative diabetic retinopathy with macular edema, left eye: Secondary | ICD-10-CM | POA: Diagnosis not present

## 2024-03-05 DIAGNOSIS — Z7984 Long term (current) use of oral hypoglycemic drugs: Secondary | ICD-10-CM | POA: Diagnosis not present

## 2024-03-05 DIAGNOSIS — E113391 Type 2 diabetes mellitus with moderate nonproliferative diabetic retinopathy without macular edema, right eye: Secondary | ICD-10-CM | POA: Diagnosis not present

## 2024-03-05 DIAGNOSIS — H43813 Vitreous degeneration, bilateral: Secondary | ICD-10-CM

## 2024-03-05 DIAGNOSIS — H338 Other retinal detachments: Secondary | ICD-10-CM | POA: Diagnosis not present

## 2024-03-05 DIAGNOSIS — I10 Essential (primary) hypertension: Secondary | ICD-10-CM

## 2024-03-05 DIAGNOSIS — H35033 Hypertensive retinopathy, bilateral: Secondary | ICD-10-CM | POA: Diagnosis not present

## 2024-03-12 ENCOUNTER — Encounter (HOSPITAL_COMMUNITY): Payer: Self-pay | Admitting: Physician Assistant

## 2024-03-12 ENCOUNTER — Ambulatory Visit (HOSPITAL_COMMUNITY)
Admission: RE | Admit: 2024-03-12 | Discharge: 2024-03-12 | Disposition: A | Discharge status: Home / Self Care | Payer: Medicare Other | Source: Ambulatory Visit | Attending: Physician Assistant | Admitting: Physician Assistant

## 2024-03-12 VITALS — BP 142/88 | HR 49 | Ht 59.0 in | Wt 140.0 lb

## 2024-03-12 DIAGNOSIS — I4819 Other persistent atrial fibrillation: Secondary | ICD-10-CM | POA: Diagnosis not present

## 2024-03-12 DIAGNOSIS — Z5181 Encounter for therapeutic drug level monitoring: Secondary | ICD-10-CM

## 2024-03-12 DIAGNOSIS — Z79899 Other long term (current) drug therapy: Secondary | ICD-10-CM | POA: Diagnosis not present

## 2024-03-12 DIAGNOSIS — D6869 Other thrombophilia: Secondary | ICD-10-CM | POA: Diagnosis not present

## 2024-03-12 DIAGNOSIS — R001 Bradycardia, unspecified: Secondary | ICD-10-CM | POA: Diagnosis not present

## 2024-03-12 NOTE — Patient Instructions (Signed)
Stop carvedilol

## 2024-03-12 NOTE — Progress Notes (Signed)
 Primary Care Physician: Gerome Brunet, DO Primary Cardiologist: Dr Loni Primary Electrophysiologist: Dr Inocencio  Referring Physician: Dr Acharya   Virginia Gilbert is a 83 y.o. female with a history of DM, HLD, HTN, and persistent atrial fibrillation who presents for follow up in the [facility]. The patient was initially diagnosed with atrial fibrillation 05/2019 after presenting with symptoms of worsening dyspnea on exertion. Patient is on Eliquis  for stroke prevention. There were no specific triggers that the patient could identify. She underwent DCCV on 07/08/19 but had ERAF. She was loaded on amiodarone  and had DCCV again on 09/02/19 but unfortunately she was back in afib again. She denies snoring or alcohol use. Patient is s/p afib ablation with Dr Kelsie on 12/02/19. She was back in afib on her follow up 01/05/20 and had another DCCV on 01/13/20. Her amiodarone  was later discontinued.   She was seen by Dr Loni 04/20/23 and found to be in persistent afib. Thought to be precipitated by her COVID 19 infection a couple months previous. She underwent DCCV on 05/16/23 but was back out of rhythm on follow up 10/18. Started back on amiodarone  06/11/23 but had to discontinue due to intolerable side effects (nausea, weakness).   S/p Tikosyn  admission 12/16-19. S/p successful DCCV on 08/01/23  Patient returns for follow up for atrial fibrillation and dofetilide  monitoring. She reports that she had done well since her last visit. She denies any interim episodes of afib. She does occasionally have dizziness with position change. No bleeding issues on anticoagulation.   Today, she  denies symptoms of palpitations, chest pain, shortness of breath, orthopnea, PND, lower extremity edema, presyncope, syncope, snoring, daytime somnolence, bleeding, or neurologic sequela. The patient is tolerating medications without difficulties and is otherwise without complaint today.    Atrial  Fibrillation Risk Factors:  she does not have symptoms or diagnosis of sleep apnea. she does not have a history of rheumatic fever. she does not have a history of alcohol use. The patient does not have a history of early familial atrial fibrillation or other arrhythmias.   Atrial Fibrillation Management history:  Previous antiarrhythmic drugs: amiodarone , tikosyn  Previous cardioversions: 07/08/19, 09/02/19, 05/16/23, 08/01/23 Previous ablations: 12/02/19 Anticoagulation history: Eliquis    Past Medical History:  Diagnosis Date   Diabetes mellitus without complication (HCC)    Hyperlipidemia    Hypertension    Mitral regurgitation    evaluated by TEE 09/2019   Persistent atrial fibrillation (HCC)     Current Outpatient Medications  Medication Sig Dispense Refill   acetaminophen  (TYLENOL ) 500 MG tablet Take 1,000 mg by mouth every 8 (eight) hours as needed for moderate pain or headache.     apixaban  (ELIQUIS ) 2.5 MG TABS tablet Take 1 tablet by mouth twice daily 180 tablet 1   Cholecalciferol  (VITAMIN D -3) 125 MCG (5000 UT) TABS Take 5,000 Units by mouth daily with lunch.      ciprofloxacin (CILOXAN) 0.3 % ophthalmic solution Place 1 drop into the left eye See admin instructions. Instill 1 drop into the left eye 4 times daily for 2 days after each monthly eye injection     cyanocobalamin  (VITAMIN B12) 1000 MCG tablet Take 1,000 mcg by mouth daily.     dofetilide  (TIKOSYN ) 125 MCG capsule Take 1 capsule by mouth twice daily 180 capsule 2   empagliflozin (JARDIANCE) 25 MG TABS tablet Take 25 mg by mouth daily. Medication on hold since the 14th, due to procedure.     ezetimibe  (  ZETIA ) 10 MG tablet Take 10 mg by mouth daily.     furosemide  (LASIX ) 20 MG tablet Take 2 tablets (40 mg total) by mouth daily in the mornings and 1 tablet (20 mg total) by mouth daily in the evenings. (Patient taking differently: Take 20 mg by mouth daily.) 360 tablet 3   glimepiride  (AMARYL ) 4 MG tablet Take 1  tablet (4 mg total) by mouth daily with breakfast. (Patient taking differently: Take 4 mg by mouth 2 (two) times daily.)  1   LORazepam  (ATIVAN ) 0.5 MG tablet Take 0.5 mg by mouth daily as needed for anxiety.     losartan  (COZAAR ) 100 MG tablet Take 100 mg by mouth daily.  6   magnesium  oxide (MAG-OX) 400 (240 Mg) MG tablet Take 1 tablet (400 mg total) by mouth at bedtime. 90 tablet 1   magnesium  oxide (MAG-OX) 400 MG tablet Take 1 tablet (400 mg total) by mouth daily. 30 tablet 3   metFORMIN  (GLUCOPHAGE ) 1000 MG tablet Take 1,000 mg by mouth 2 (two) times daily with a meal.   6   potassium chloride  (KLOR-CON  M) 10 MEQ tablet Take 1 tablet (10 mEq total) by mouth 2 (two) times daily. 180 tablet 1   pravastatin  (PRAVACHOL ) 10 MG tablet Take 10 mg by mouth daily.     No current facility-administered medications for this encounter.    ROS- All systems are reviewed and negative except as per the HPI above.  Physical Exam: Vitals:   03/12/24 1013  BP: (!) 142/88  Pulse: (!) 49  Weight: 63.5 kg  Height: 4' 11 (1.499 m)    GEN: Well nourished, well developed in no acute distress CARDIAC: Regular rate and rhythm, no murmurs, rubs, gallops RESPIRATORY:  Clear to auscultation without rales, wheezing or rhonchi  ABDOMEN: Soft, non-tender, non-distended EXTREMITIES:  No edema; No deformity    Wt Readings from Last 3 Encounters:  03/12/24 63.5 kg  11/29/23 65.5 kg  09/11/23 64 kg    EKG today demonstrates  SB, inc RBBB Vent. rate 49 BPM PR interval 154 ms QRS duration 118 ms QT/QTcB 492/444 ms   Echo 10/21/21 demonstrated   1. Left ventricular ejection fraction by 3D volume is 56 %. The left  ventricle has normal function. The left ventricle has no regional wall  motion abnormalities. Left ventricular diastolic parameters are  indeterminate.   2. Right ventricular systolic function is normal. The right ventricular  size is normal. There is normal pulmonary artery systolic  pressure.   3. The mitral valve is normal in structure. Trivial mitral valve  regurgitation. No evidence of mitral stenosis. Moderate mitral annular  calcification.   4. The aortic valve is normal in structure. Aortic valve regurgitation is  not visualized. Aortic valve sclerosis/calcification is present, without  any evidence of aortic stenosis.   5. The inferior vena cava is normal in size with greater than 50%  respiratory variability, suggesting right atrial pressure of 3 mmHg.   Epic records are reviewed at length today  CHA2DS2-VASc Score = 6  The patient's score is based upon: CHF History: 1 HTN History: 1 Diabetes History: 1 Stroke History: 0 Vascular Disease History: 0 Age Score: 2 Gender Score: 1       ASSESSMENT AND PLAN: Persistent Atrial Fibrillation/atrial flutter (ICD10:  I48.19) The patient's CHA2DS2-VASc score is 6, indicating a 9.7% annual risk of stroke.   S/p ablation 11/2019 S/p dofetilide  admission 07/2023 Patient appears to be maintaining SR Will discontinue carvedilol   given bradycardia and intermittent dizziness. Continue dofetilide  125 mcg BID Continue Eliquis  2.5 mg BID  Secondary Hypercoagulable State (ICD10:  D68.69) The patient is at significant risk for stroke/thromboembolism based upon her CHA2DS2-VASc Score of 6.  Continue Apixaban  (Eliquis ). No bleeding issues.   High Risk Medication Monitoring (ICD 10: Z79.899) QT interval on ECG acceptable for dofetilide  monitoring. Check bmet/mag today.     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    Daril Kicks PA-C Afib [facility] [address] [place], [place] [address] [phone] 03/12/2024 10:44 AM

## 2024-03-13 ENCOUNTER — Ambulatory Visit (HOSPITAL_COMMUNITY): Payer: Self-pay | Admitting: Physician Assistant

## 2024-03-13 LAB — BASIC METABOLIC PANEL WITH GFR
BUN/Creatinine Ratio: 24 (ref 12–28)
BUN: 39 mg/dL — ABNORMAL HIGH (ref 8–27)
CO2: 23 mmol/L (ref 20–29)
Calcium: 9.8 mg/dL (ref 8.7–10.3)
Chloride: 95 mmol/L — ABNORMAL LOW (ref 96–106)
Creatinine, Ser: 1.6 mg/dL — ABNORMAL HIGH (ref 0.57–1.00)
Glucose: 148 mg/dL — ABNORMAL HIGH (ref 70–99)
Potassium: 5.2 mmol/L (ref 3.5–5.2)
Sodium: 138 mmol/L (ref 134–144)
eGFR: 32 mL/min/1.73 — ABNORMAL LOW (ref >59)

## 2024-03-13 LAB — MAGNESIUM: Magnesium: 2.5 mg/dL — ABNORMAL HIGH (ref 1.6–2.3)

## 2024-03-19 ENCOUNTER — Ambulatory Visit: Attending: Cardiology | Admitting: Internal Medicine

## 2024-03-19 VITALS — BP 122/74 | HR 76 | Ht 59.0 in | Wt 136.8 lb

## 2024-03-19 DIAGNOSIS — R001 Bradycardia, unspecified: Secondary | ICD-10-CM | POA: Diagnosis not present

## 2024-03-19 DIAGNOSIS — I5032 Chronic diastolic (congestive) heart failure: Secondary | ICD-10-CM

## 2024-03-19 DIAGNOSIS — E782 Mixed hyperlipidemia: Secondary | ICD-10-CM | POA: Diagnosis not present

## 2024-03-19 DIAGNOSIS — I10 Essential (primary) hypertension: Secondary | ICD-10-CM

## 2024-03-19 DIAGNOSIS — I340 Nonrheumatic mitral (valve) insufficiency: Secondary | ICD-10-CM

## 2024-03-19 DIAGNOSIS — R42 Dizziness and giddiness: Secondary | ICD-10-CM | POA: Diagnosis not present

## 2024-03-19 DIAGNOSIS — D6869 Other thrombophilia: Secondary | ICD-10-CM

## 2024-03-19 DIAGNOSIS — I480 Paroxysmal atrial fibrillation: Secondary | ICD-10-CM | POA: Diagnosis not present

## 2024-03-19 DIAGNOSIS — I4819 Other persistent atrial fibrillation: Secondary | ICD-10-CM

## 2024-03-19 MED ORDER — POTASSIUM CHLORIDE CRYS ER 10 MEQ PO TBCR: 10.0000 meq | EXTENDED_RELEASE_TABLET | Freq: Every day | ORAL | Status: AC

## 2024-03-19 MED ORDER — FUROSEMIDE 20 MG PO TABS: 20.0000 mg | ORAL_TABLET | Freq: Two times a day (BID) | ORAL | Status: DC

## 2024-03-19 NOTE — Patient Instructions (Signed)
 Medication Instructions:  Your physician recommends that you continue on your current medications as directed. Please refer to the Current Medication list given to you today.  *If you need a refill on your cardiac medications before your next appointment, please call your pharmacy*  Testing/Procedures: Your physician has requested that you have an echocardiogram. Echocardiography is a painless test that uses sound waves to create images of your heart. It provides your doctor with information about the size and shape of your heart and how well your heart's chambers and valves are working. This procedure takes approximately one hour. There are no restrictions for this procedure. Please do NOT wear cologne, perfume, aftershave, or lotions (deodorant is allowed). Please arrive 15 minutes prior to your appointment time.   Follow-Up: At [facility], you and your health needs are our priority.  As part of our continuing mission to provide you with exceptional heart care, our providers are all part of one team.  This team includes your primary Cardiologist (physician) and Advanced Practice Providers or APPs (Physician Assistants and Nurse Practitioners) who all work together to provide you with the care you need, when you need it.  Your next appointment:   In 1 year with Dr. Acharya (after echo)

## 2024-03-19 NOTE — Progress Notes (Signed)
 Cardiology Office Note:  .   Date:  03/19/2024  ID:  Virginia Gilbert, DOB 05-11-1941, MRN [id number] PCP: Gerome Brunet, DO  [facility] Providers Cardiologist:  Soyla DELENA Merck, MD Electrophysiologist:  Soyla Gladis Norton, MD    History of Present Illness: .   Virginia Gilbert is a 83 y.o. female.  Discussed the use of AI scribe software for clinical note transcription with the patient, who gave verbal consent to proceed.  History of Present Illness Virginia Gilbert is an 83 year old female with atrial fibrillation who presents with dizziness upon position changes.  Dizziness occurs when bending over and straightening up, resolving quickly and not daily. This has been present for three to four months. She stopped carvedilol  recently, suspecting it exacerbated dizziness, and has not experienced dizziness in the past week. It seems this change has improved things.  Atrial fibrillation is managed with Tikosyn  (dofetilide ) since December, with good control. She previously had issues with amiodarone . She takes Eliquis  2.5 mg twice daily without bleeding complications.  Lasix  (furosemide ) is taken for fluid management, one tablet in the morning and one at night, with no fluid retention or swelling. Losartan  100 mg is taken daily for blood pressure control. Potassium and magnesium  oxide are taken daily, with recent magnesium  level at 2.5. Kidney function is abnormal but stable with a recent level of 1.6.    ROS: negative except per HPI above.  Studies Reviewed: .        Results LABS   Hemoglobin A1c: 7.2%   Cholesterol: 112 mg/dL   Magnesium : 2.5 mg/dL   Creatinine: 1.6 mg/dL Risk Assessment/Calculations:    CHA2DS2-VASc Score = 6   This indicates a 9.7% annual risk of stroke. The patient's score is based upon: CHF History: 1 HTN History: 1 Diabetes History: 1 Stroke History: 0 Vascular Disease History: 0 Age Score: 2 Gender Score: 1      Physical Exam:   VS:   BP 122/74 (BP Location: Left Arm, Patient Position: Sitting, Cuff Size: Normal)   Pulse 76   Ht 4' 11 (1.499 m)   Wt 136 lb 12.8 oz (62.1 kg)   SpO2 93%   BMI 27.63 kg/m    Wt Readings from Last 3 Encounters:  03/19/24 136 lb 12.8 oz (62.1 kg)  03/12/24 140 lb (63.5 kg)  11/29/23 144 lb 6.4 oz (65.5 kg)     Physical Exam VITALS: P- 76, BP- 122/74 GENERAL: Alert, cooperative, well developed, no acute distress. HEENT: Normocephalic, normal oropharynx, moist mucous membranes. CHEST: Clear to auscultation bilaterally, no wheezes, rhonchi, or crackles. CARDIOVASCULAR: Normal heart rate and rhythm, S1 and S2 normal without murmurs. ABDOMEN: Soft, non-tender, non-distended, without organomegaly, normal bowel sounds. EXTREMITIES: No cyanosis or edema. NEUROLOGICAL: Cranial nerves grossly intact, moves all extremities without gross motor or sensory deficit.   ASSESSMENT AND PLAN: .    Assessment and Plan Assessment & Plan Atrial fibrillation status post ablation on dofetilide  Atrial fibrillation managed with dofetilide , maintaining normal rhythm. Previous amiodarone  issues resolved. - Continue dofetilide  (Tikosyn ) 125 mg BID as prescribed. - Follow up in November with afib clinic  Heart failure with preserved ejection fraction Heart failure well-managed. No fluid retention. Blood pressure and heart rate controlled. - Continue  lasix  20 mg BID - continue potassium for now 10 mEq daily. Last k 5.2   Orthostatic dizziness, resolved Bradycardia, improved Orthostatic dizziness resolved after discontinuing carvedilol . Blood pressure and heart rate improved. - Discontinue carvedilol  permanently unless symptoms  recur.  Type 2 diabetes mellitus Type 2 diabetes managed with Jardiance. Hemoglobin A1c is 7.2. No hypoglycemia or hyperglycemia. - Continue Jardiance 25 mg daily.  Hyperlipidemia Hyperlipidemia managed with ezetimibe  and pravastatin . LDL slightly above target but  acceptable. - Continue ezetimibe  10 mg daily. - Continue pravastatin  10 mg daily. - Reassess lipid levels as needed.  Hypertension Hypertension well-controlled with losartan . Blood pressure is 122/74 mmHg. - Continue losartan  100 mg daily.  Electrolyte monitoring (potassium and magnesium ) Potassium high-normal, magnesium  slightly elevated but stable. Magnesium  aids sleep, unlikely to cause issues unless kidney function worsens. - Continue magnesium  oxide 400 mg daily at night. - Monitor potassium levels; if abnormally elevated, discontinue potassium supplementation. - Reassess electrolyte levels with upcoming lab work with PCP.      Soyla Merck, MD, FACC

## 2024-04-02 ENCOUNTER — Encounter (INDEPENDENT_AMBULATORY_CARE_PROVIDER_SITE_OTHER): Admitting: Ophthalmology

## 2024-04-02 DIAGNOSIS — I10 Essential (primary) hypertension: Secondary | ICD-10-CM

## 2024-04-02 DIAGNOSIS — E113312 Type 2 diabetes mellitus with moderate nonproliferative diabetic retinopathy with macular edema, left eye: Secondary | ICD-10-CM | POA: Diagnosis not present

## 2024-04-02 DIAGNOSIS — H35033 Hypertensive retinopathy, bilateral: Secondary | ICD-10-CM

## 2024-04-02 DIAGNOSIS — H43813 Vitreous degeneration, bilateral: Secondary | ICD-10-CM

## 2024-04-02 DIAGNOSIS — Z7984 Long term (current) use of oral hypoglycemic drugs: Secondary | ICD-10-CM | POA: Diagnosis not present

## 2024-04-02 DIAGNOSIS — E113391 Type 2 diabetes mellitus with moderate nonproliferative diabetic retinopathy without macular edema, right eye: Secondary | ICD-10-CM

## 2024-04-28 ENCOUNTER — Ambulatory Visit (INDEPENDENT_AMBULATORY_CARE_PROVIDER_SITE_OTHER): Admitting: Podiatry

## 2024-04-28 DIAGNOSIS — M79675 Pain in left toe(s): Secondary | ICD-10-CM

## 2024-04-28 DIAGNOSIS — E119 Type 2 diabetes mellitus without complications: Secondary | ICD-10-CM | POA: Diagnosis not present

## 2024-04-28 DIAGNOSIS — B351 Tinea unguium: Secondary | ICD-10-CM | POA: Diagnosis not present

## 2024-04-28 DIAGNOSIS — M79674 Pain in right toe(s): Secondary | ICD-10-CM

## 2024-04-29 ENCOUNTER — Other Ambulatory Visit (HOSPITAL_COMMUNITY)

## 2024-04-29 NOTE — Progress Notes (Signed)
 Subjective: Chief Complaint  Patient presents with   Nail Problem    Pt stated that she is here for a nail trim      83 year old female presents the office today for thick, elongated nails that she is not able to trim herself.  She has no other concerns today.  She states the injection helped on the right side but then she started and states it is on the left side.  She took turmeric and this resolved.  No foot pain at this time otherwise.  Objective: AAO x3, NAD DP/PT pulses palpable bilaterally, CRT less than 3 seconds Nails are hypertrophic, dystrophic, brittle, discolored, elongated 10. No surrounding redness or drainage. Tenderness nails 1-5 bilaterally. No open lesions or pre-ulcerative lesions are identified today. No pain with calf compression, swelling, warmth, erythema  Assessment: Symptomatic onychomycosis  Plan: Symptomatic onychomycosis - Nails sharply debrided x 10 without any complications or bleeding.  Discussed treatment options for nail fungus.  She wants to continue with routine debridement.  Foot pain is resolved.  Monitor for any reoccurrence.  Return in about 3 months (around 07/28/2024) for nail trim.  Donnice JONELLE Fees DPM

## 2024-04-30 ENCOUNTER — Encounter (INDEPENDENT_AMBULATORY_CARE_PROVIDER_SITE_OTHER): Admitting: Ophthalmology

## 2024-04-30 DIAGNOSIS — H35033 Hypertensive retinopathy, bilateral: Secondary | ICD-10-CM | POA: Diagnosis not present

## 2024-04-30 DIAGNOSIS — E113391 Type 2 diabetes mellitus with moderate nonproliferative diabetic retinopathy without macular edema, right eye: Secondary | ICD-10-CM | POA: Diagnosis not present

## 2024-04-30 DIAGNOSIS — H43813 Vitreous degeneration, bilateral: Secondary | ICD-10-CM

## 2024-04-30 DIAGNOSIS — I10 Essential (primary) hypertension: Secondary | ICD-10-CM | POA: Diagnosis not present

## 2024-04-30 DIAGNOSIS — E113212 Type 2 diabetes mellitus with mild nonproliferative diabetic retinopathy with macular edema, left eye: Secondary | ICD-10-CM | POA: Diagnosis not present

## 2024-04-30 DIAGNOSIS — Z7984 Long term (current) use of oral hypoglycemic drugs: Secondary | ICD-10-CM

## 2024-05-07 DIAGNOSIS — E113293 Type 2 diabetes mellitus with mild nonproliferative diabetic retinopathy without macular edema, bilateral: Secondary | ICD-10-CM | POA: Diagnosis not present

## 2024-05-27 ENCOUNTER — Encounter (INDEPENDENT_AMBULATORY_CARE_PROVIDER_SITE_OTHER): Admitting: Ophthalmology

## 2024-05-27 DIAGNOSIS — Z7984 Long term (current) use of oral hypoglycemic drugs: Secondary | ICD-10-CM | POA: Diagnosis not present

## 2024-05-27 DIAGNOSIS — I10 Essential (primary) hypertension: Secondary | ICD-10-CM | POA: Diagnosis not present

## 2024-05-27 DIAGNOSIS — E113291 Type 2 diabetes mellitus with mild nonproliferative diabetic retinopathy without macular edema, right eye: Secondary | ICD-10-CM

## 2024-05-27 DIAGNOSIS — H43813 Vitreous degeneration, bilateral: Secondary | ICD-10-CM

## 2024-05-27 DIAGNOSIS — E113312 Type 2 diabetes mellitus with moderate nonproliferative diabetic retinopathy with macular edema, left eye: Secondary | ICD-10-CM

## 2024-05-27 DIAGNOSIS — H35033 Hypertensive retinopathy, bilateral: Secondary | ICD-10-CM | POA: Diagnosis not present

## 2024-05-27 DIAGNOSIS — H338 Other retinal detachments: Secondary | ICD-10-CM

## 2024-06-25 ENCOUNTER — Encounter (INDEPENDENT_AMBULATORY_CARE_PROVIDER_SITE_OTHER): Admitting: Ophthalmology

## 2024-06-25 DIAGNOSIS — H338 Other retinal detachments: Secondary | ICD-10-CM

## 2024-06-25 DIAGNOSIS — Z7984 Long term (current) use of oral hypoglycemic drugs: Secondary | ICD-10-CM | POA: Diagnosis not present

## 2024-06-25 DIAGNOSIS — I10 Essential (primary) hypertension: Secondary | ICD-10-CM

## 2024-06-25 DIAGNOSIS — E113291 Type 2 diabetes mellitus with mild nonproliferative diabetic retinopathy without macular edema, right eye: Secondary | ICD-10-CM

## 2024-06-25 DIAGNOSIS — E113312 Type 2 diabetes mellitus with moderate nonproliferative diabetic retinopathy with macular edema, left eye: Secondary | ICD-10-CM | POA: Diagnosis not present

## 2024-06-25 DIAGNOSIS — H35033 Hypertensive retinopathy, bilateral: Secondary | ICD-10-CM

## 2024-06-25 DIAGNOSIS — H43813 Vitreous degeneration, bilateral: Secondary | ICD-10-CM

## 2024-07-07 ENCOUNTER — Other Ambulatory Visit (HOSPITAL_COMMUNITY): Payer: Self-pay | Admitting: Physician Assistant

## 2024-07-07 ENCOUNTER — Ambulatory Visit (HOSPITAL_COMMUNITY)
Admission: RE | Admit: 2024-07-07 | Discharge: 2024-07-07 | Disposition: A | Discharge status: Home / Self Care | Source: Ambulatory Visit | Attending: Physician Assistant | Admitting: Physician Assistant

## 2024-07-07 VITALS — BP 168/76 | HR 79 | Ht 59.0 in | Wt 140.8 lb

## 2024-07-07 DIAGNOSIS — D6869 Other thrombophilia: Secondary | ICD-10-CM

## 2024-07-07 DIAGNOSIS — I4819 Other persistent atrial fibrillation: Secondary | ICD-10-CM | POA: Diagnosis not present

## 2024-07-07 DIAGNOSIS — Z5181 Encounter for therapeutic drug level monitoring: Secondary | ICD-10-CM | POA: Diagnosis not present

## 2024-07-07 DIAGNOSIS — Z79899 Other long term (current) drug therapy: Secondary | ICD-10-CM

## 2024-07-07 DIAGNOSIS — I4891 Unspecified atrial fibrillation: Secondary | ICD-10-CM

## 2024-07-07 DIAGNOSIS — I480 Paroxysmal atrial fibrillation: Secondary | ICD-10-CM

## 2024-07-07 NOTE — Progress Notes (Signed)
 Primary Care Physician: Gerome Brunet, DO Primary Cardiologist: Dr Loni Primary Electrophysiologist: Dr Inocencio  Referring Physician: Dr Loni Maine Virginia Gilbert is a 83 y.o. female with a history of DM, HLD, HTN, and persistent atrial fibrillation who presents for follow up in the [facility]. The patient was initially diagnosed with atrial fibrillation 05/2019 after presenting with symptoms of worsening dyspnea on exertion. Patient is on Eliquis  for stroke prevention. There were no specific triggers that the patient could identify. She underwent DCCV on 07/08/19 but had ERAF. She was loaded on amiodarone  and had DCCV again on 09/02/19 but unfortunately she was back in afib again. She denies snoring or alcohol use. Patient is s/p afib ablation with Dr Kelsie on 12/02/19. She was back in afib on her follow up 01/05/20 and had another DCCV on 01/13/20. Her amiodarone  was later discontinued.   She was seen by Dr Loni 04/20/23 and found to be in persistent afib. Thought to be precipitated by her COVID 19 infection a couple months previous. She underwent DCCV on 05/16/23 but was back out of rhythm on follow up 10/18. Started back on amiodarone  06/11/23 but had to discontinue due to intolerable side effects (nausea, weakness).   S/p Tikosyn  admission 12/16-19. S/p successful DCCV on 08/01/23  Patient returns for follow up for atrial fibrillation and dofetilide  monitoring. She remains in SR today and feels well. She denies any interim symptoms of afib. No bleeding issues on anticoagulation.   Today, she  denies symptoms of palpitations, chest pain, shortness of breath, orthopnea, PND, lower extremity edema, dizziness, presyncope, syncope, snoring, daytime somnolence, bleeding, or neurologic sequela. The patient is tolerating medications without difficulties and is otherwise without complaint today.    Atrial Fibrillation Risk Factors:  she does not have symptoms or  diagnosis of sleep apnea. she does not have a history of rheumatic fever. she does not have a history of alcohol use. The patient does not have a history of early familial atrial fibrillation or other arrhythmias.   Atrial Fibrillation Management history:  Previous antiarrhythmic drugs: amiodarone , tikosyn  Previous cardioversions: 07/08/19, 09/02/19, 05/16/23, 08/01/23 Previous ablations: 12/02/19 Anticoagulation history: Eliquis    Past Medical History:  Diagnosis Date   Diabetes mellitus without complication (HCC)    Hyperlipidemia    Hypertension    Mitral regurgitation    evaluated by TEE 09/2019   Persistent atrial fibrillation (HCC)     Current Outpatient Medications  Medication Sig Dispense Refill   acetaminophen  (TYLENOL ) 500 MG tablet Take 1,000 mg by mouth every 8 (eight) hours as needed for moderate pain or headache. (Patient taking differently: Take 1,000 mg by mouth as needed for moderate pain (pain score 4-6) or headache.)     apixaban  (ELIQUIS ) 2.5 MG TABS tablet Take 1 tablet by mouth twice daily 60 tablet 6   Cholecalciferol  (VITAMIN D -3) 125 MCG (5000 UT) TABS Take 5,000 Units by mouth daily with lunch.      ciprofloxacin (CILOXAN) 0.3 % ophthalmic solution Place 1 drop into the left eye See admin instructions. Instill 1 drop into the left eye 4 times daily for 2 days after each monthly eye injection     cyanocobalamin  (VITAMIN B12) 1000 MCG tablet Take 1,000 mcg by mouth daily.     dofetilide  (TIKOSYN ) 125 MCG capsule Take 1 capsule by mouth twice daily 180 capsule 2   empagliflozin (JARDIANCE) 25 MG TABS tablet Take 25 mg by mouth daily. Medication on hold since the 14th,  due to procedure.     ezetimibe  (ZETIA ) 10 MG tablet Take 10 mg by mouth daily.     furosemide  (LASIX ) 40 MG tablet Take 40 mg by mouth daily. (Patient taking differently: Take 20 mg by mouth 2 (two) times daily.)     glimepiride  (AMARYL ) 4 MG tablet Take 1 tablet (4 mg total) by mouth daily with  breakfast. (Patient taking differently: Take 4 mg by mouth in the morning and at bedtime.)  1   LORazepam  (ATIVAN ) 0.5 MG tablet Take 0.5 mg by mouth daily as needed for anxiety.     losartan  (COZAAR ) 100 MG tablet Take 100 mg by mouth daily.  6   magnesium  oxide (MAG-OX) 400 (240 Mg) MG tablet Take 1 tablet (400 mg total) by mouth at bedtime. 90 tablet 1   metFORMIN  (GLUCOPHAGE ) 1000 MG tablet Take 1,000 mg by mouth 2 (two) times daily with a meal.   6   potassium chloride  (KLOR-CON  M) 10 MEQ tablet Take 1 tablet (10 mEq total) by mouth daily.     pravastatin  (PRAVACHOL ) 10 MG tablet Take 10 mg by mouth daily.     No current facility-administered medications for this encounter.    ROS- All systems are reviewed and negative except as per the HPI above.  Physical Exam: Vitals:   07/07/24 1114  BP: (!) 168/76  Pulse: 79  Weight: 63.9 kg  Height: 4' 11 (1.499 m)    GEN: Well nourished, well developed in no acute distress CARDIAC: Regular rate and rhythm, no murmurs, rubs, gallops RESPIRATORY:  Clear to auscultation without rales, wheezing or rhonchi  ABDOMEN: Soft, non-tender, non-distended EXTREMITIES:  No edema; No deformity    Wt Readings from Last 3 Encounters:  07/07/24 63.9 kg  03/19/24 62.1 kg  03/12/24 63.5 kg    EKG Interpretation Date/Time:  Monday July 07 2024 11:26:19 EST Ventricular Rate:  79 PR Interval:  152 QRS Duration:  126 QT Interval:  422 QTC Calculation: 483 R Axis:   96  Text Interpretation: Normal sinus rhythm Right bundle branch block T wave abnormality, consider inferolateral ischemia Abnormal ECG When compared with ECG of 12-Mar-2024 10:16, Sinus rhythm is no longer with sinus pause Confirmed by Mayvis Agudelo ([id number]) on 07/07/2024 11:34:10 AM    Echo 10/21/21 demonstrated   1. Left ventricular ejection fraction by 3D volume is 56 %. The left  ventricle has normal function. The left ventricle has no regional wall  motion abnormalities. Left  ventricular diastolic parameters are  indeterminate.   2. Right ventricular systolic function is normal. The right ventricular  size is normal. There is normal pulmonary artery systolic pressure.   3. The mitral valve is normal in structure. Trivial mitral valve  regurgitation. No evidence of mitral stenosis. Moderate mitral annular  calcification.   4. The aortic valve is normal in structure. Aortic valve regurgitation is  not visualized. Aortic valve sclerosis/calcification is present, without  any evidence of aortic stenosis.   5. The inferior vena cava is normal in size with greater than 50%  respiratory variability, suggesting right atrial pressure of 3 mmHg.   Epic records are reviewed at length today   CHA2DS2-VASc Score = 6  The patient's score is based upon: CHF History: 1 HTN History: 1 Diabetes History: 1 Stroke History: 0 Vascular Disease History: 0 Age Score: 2 Gender Score: 1       ASSESSMENT AND PLAN: Persistent Atrial Fibrillation/atrial flutter (ICD10:  I48.19) The patient's CHA2DS2-VASc score is 6,  indicating a 9.7% annual risk of stroke.   S/p afib ablation 11/2019 S/p dofetilide  admission 07/2023 Patient appears to be maintaining SR Continue dofetilide  125 mcg BID Continue Eliquis  2.5 mg BID  Secondary Hypercoagulable State (ICD10:  D68.69) The patient is at significant risk for stroke/thromboembolism based upon her CHA2DS2-VASc Score of 6.  Continue Apixaban  (Eliquis ). No bleeding issues.   High Risk Medication Monitoring (ICD 10: J342684) Patient requires ongoing monitoring for anti-arrhythmic medication which has the potential to cause life threatening arrhythmias. QT interval on ECG acceptable for dofetilide  monitoring. Check bmet/mag today.     HTN Elevated today, well controlled on home readings.  No changes today.  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    Daril Kicks PA-C Afib Clinic [facility] [address] [place], [place] [address] [phone] 07/07/2024 11:41 AM

## 2024-07-08 ENCOUNTER — Ambulatory Visit (HOSPITAL_COMMUNITY): Payer: Self-pay | Admitting: Physician Assistant

## 2024-07-08 LAB — BASIC METABOLIC PANEL WITH GFR
BUN/Creatinine Ratio: 21 (ref 12–28)
BUN: 31 mg/dL — ABNORMAL HIGH (ref 8–27)
CO2: 23 mmol/L (ref 20–29)
Calcium: 9.5 mg/dL (ref 8.7–10.3)
Chloride: 96 mmol/L (ref 96–106)
Creatinine, Ser: 1.49 mg/dL — ABNORMAL HIGH (ref 0.57–1.00)
Glucose: 264 mg/dL — ABNORMAL HIGH (ref 70–99)
Potassium: 4.3 mmol/L (ref 3.5–5.2)
Sodium: 138 mmol/L (ref 134–144)
eGFR: 35 mL/min/1.73 — ABNORMAL LOW (ref >59)

## 2024-07-08 LAB — MAGNESIUM: Magnesium: 2 mg/dL (ref 1.6–2.3)

## 2024-07-24 ENCOUNTER — Encounter (INDEPENDENT_AMBULATORY_CARE_PROVIDER_SITE_OTHER): Admitting: Ophthalmology

## 2024-07-24 DIAGNOSIS — Z7984 Long term (current) use of oral hypoglycemic drugs: Secondary | ICD-10-CM

## 2024-07-24 DIAGNOSIS — H338 Other retinal detachments: Secondary | ICD-10-CM

## 2024-07-24 DIAGNOSIS — H35033 Hypertensive retinopathy, bilateral: Secondary | ICD-10-CM | POA: Diagnosis not present

## 2024-07-24 DIAGNOSIS — H43813 Vitreous degeneration, bilateral: Secondary | ICD-10-CM | POA: Diagnosis not present

## 2024-07-24 DIAGNOSIS — I10 Essential (primary) hypertension: Secondary | ICD-10-CM

## 2024-07-24 DIAGNOSIS — E113291 Type 2 diabetes mellitus with mild nonproliferative diabetic retinopathy without macular edema, right eye: Secondary | ICD-10-CM | POA: Diagnosis not present

## 2024-07-24 DIAGNOSIS — E113312 Type 2 diabetes mellitus with moderate nonproliferative diabetic retinopathy with macular edema, left eye: Secondary | ICD-10-CM

## 2024-08-19 ENCOUNTER — Encounter: Payer: Self-pay | Admitting: Podiatry

## 2024-08-19 ENCOUNTER — Ambulatory Visit: Admitting: Podiatry

## 2024-08-19 DIAGNOSIS — M79674 Pain in right toe(s): Secondary | ICD-10-CM

## 2024-08-19 DIAGNOSIS — B351 Tinea unguium: Secondary | ICD-10-CM

## 2024-08-19 DIAGNOSIS — M79675 Pain in left toe(s): Secondary | ICD-10-CM

## 2024-08-19 DIAGNOSIS — E119 Type 2 diabetes mellitus without complications: Secondary | ICD-10-CM | POA: Diagnosis not present

## 2024-08-20 ENCOUNTER — Encounter (INDEPENDENT_AMBULATORY_CARE_PROVIDER_SITE_OTHER): Admitting: Ophthalmology

## 2024-08-20 DIAGNOSIS — Z7984 Long term (current) use of oral hypoglycemic drugs: Secondary | ICD-10-CM

## 2024-08-20 DIAGNOSIS — H43812 Vitreous degeneration, left eye: Secondary | ICD-10-CM

## 2024-08-20 DIAGNOSIS — E113312 Type 2 diabetes mellitus with moderate nonproliferative diabetic retinopathy with macular edema, left eye: Secondary | ICD-10-CM | POA: Diagnosis not present

## 2024-08-20 DIAGNOSIS — H35032 Hypertensive retinopathy, left eye: Secondary | ICD-10-CM

## 2024-08-20 DIAGNOSIS — I10 Essential (primary) hypertension: Secondary | ICD-10-CM

## 2024-08-24 NOTE — Progress Notes (Signed)
"  °  Subjective:  Patient ID: Virginia Gilbert, female    DOB: March 20, 1941,  MRN: [id number]  Virginia Gilbert presents to clinic today for preventative diabetic foot care for painful thick toenails that are difficult to trim. Pain interferes with ambulation. Aggravating factors include wearing enclosed shoe gear. Pain is relieved with periodic professional debridement.  Chief Complaint  Patient presents with   Diabetes    Cut my nails.  I don't want no sharp edges left on them, they catch my socks. Saw Virginia Cork, NP - 06/17/2024; Last A1C was 7.6      New problem(s): None.   PCP is Virginia Brunet, DO.  Allergies[1]  Review of Systems: Negative except as noted in the HPI.  Objective: No changes noted in today's physical examination. There were no vitals filed for this visit. Virginia Gilbert is a pleasant 84 y.o. female WD, WN in NAD. AAO x 3.  Vascular Examination: CFT <3 seconds b/l. DP pulses palpable b/l. PT pulses nonpalpable b/l. Digital hair absent. Skin temperature gradient warm to warm b/l. No pain with calf compression. No ischemia or gangrene. No cyanosis or clubbing noted b/l.    Neurological Examination: Sensation grossly intact b/l with 10 gram monofilament. Vibratory sensation intact b/l. Pt has subjective symptoms of neuropathy.  Dermatological Examination: Pedal skin warm and supple b/l. No open wounds b/l. No interdigital macerations. Toenails 1-5 b/l thick, discolored, elongated with subungual debris and pain on dorsal palpation.  No hyperkeratotic nor porokeratotic lesions present on today's visit.  Musculoskeletal Examination: Muscle strength 5/5 to all lower extremity muscle groups bilaterally. Hammertoe(s) noted to the R 2nd toe.  Radiographs: None  Assessment/Plan: 1. Pain due to onychomycosis of toenails of both feet   2. Type 2 diabetes mellitus without complication, without long-term current use of insulin  Virginia Gilbert)   Consent given for treatment. Patient  examined. All patient's and/or POA's questions/concerns addressed on today's visit. Mycotic toenails 1-5 b/l debrided in length and girth without incident. Continue foot and shoe inspections daily. Monitor blood glucose per PCP/Endocrinologist's recommendations.Continue soft, supportive shoe gear daily. Report any pedal injuries to medical professional. Call office if there are any quesitons/concerns. -Patient/POA to call should there be question/concern in the interim.   Return in about 3 months (around 11/17/2024).  Virginia Gilbert, DPM      [place] LOCATION: [address], [place] [address]                   Office [phone]   [facility] LOCATION: [address] [place], [place] [address] Office [phone]     [1]  Allergies Allergen Reactions   Penicillins Anaphylaxis        Tramadol Itching   Codeine Nausea And Vomiting   "

## 2024-09-17 ENCOUNTER — Encounter (INDEPENDENT_AMBULATORY_CARE_PROVIDER_SITE_OTHER): Admitting: Ophthalmology

## 2024-09-18 ENCOUNTER — Encounter (INDEPENDENT_AMBULATORY_CARE_PROVIDER_SITE_OTHER): Admitting: Ophthalmology

## 2024-09-23 ENCOUNTER — Encounter (INDEPENDENT_AMBULATORY_CARE_PROVIDER_SITE_OTHER): Admitting: Ophthalmology

## 2024-09-26 ENCOUNTER — Encounter (INDEPENDENT_AMBULATORY_CARE_PROVIDER_SITE_OTHER): Admitting: Ophthalmology

## 2024-11-25 ENCOUNTER — Ambulatory Visit: Admitting: Podiatry

## 2025-01-01 ENCOUNTER — Ambulatory Visit (HOSPITAL_COMMUNITY): Admitting: Physician Assistant

## 2025-03-17 ENCOUNTER — Other Ambulatory Visit (HOSPITAL_COMMUNITY)
